# Patient Record
Sex: Female | Born: 1935 | Race: White | Hispanic: No | Smoking: Never smoker
Health system: Southern US, Community
[De-identification: ages and names within clinical notes are randomized; demographics above are authoritative.]

## PROBLEM LIST (undated history)

## (undated) DIAGNOSIS — E7800 Pure hypercholesterolemia, unspecified: Secondary | ICD-10-CM

## (undated) DIAGNOSIS — I10 Essential (primary) hypertension: Secondary | ICD-10-CM

## (undated) DIAGNOSIS — L03113 Cellulitis of right upper limb: Secondary | ICD-10-CM

## (undated) DIAGNOSIS — H9190 Unspecified hearing loss, unspecified ear: Secondary | ICD-10-CM

## (undated) DIAGNOSIS — I4891 Unspecified atrial fibrillation: Secondary | ICD-10-CM

## (undated) DIAGNOSIS — I495 Sick sinus syndrome: Secondary | ICD-10-CM

## (undated) DIAGNOSIS — K5900 Constipation, unspecified: Secondary | ICD-10-CM

## (undated) DIAGNOSIS — E782 Mixed hyperlipidemia: Secondary | ICD-10-CM

## (undated) DIAGNOSIS — D649 Anemia, unspecified: Secondary | ICD-10-CM

## (undated) DIAGNOSIS — F419 Anxiety disorder, unspecified: Secondary | ICD-10-CM

## (undated) DIAGNOSIS — C44310 Basal cell carcinoma of skin of unspecified parts of face: Secondary | ICD-10-CM

## (undated) DIAGNOSIS — Z0000 Encounter for general adult medical examination without abnormal findings: Secondary | ICD-10-CM

## (undated) DIAGNOSIS — M1990 Unspecified osteoarthritis, unspecified site: Secondary | ICD-10-CM

## (undated) DIAGNOSIS — R55 Syncope and collapse: Secondary | ICD-10-CM

## (undated) HISTORY — DX: Constipation, unspecified: K59.00

## (undated) HISTORY — DX: Unspecified osteoarthritis, unspecified site: M19.90

## (undated) HISTORY — PX: TUBAL LIGATION: SHX77

## (undated) HISTORY — DX: Unspecified hearing loss, unspecified ear: H91.90

## (undated) HISTORY — DX: Basal cell carcinoma of skin of unspecified parts of face: C44.310

## (undated) HISTORY — DX: Essential (primary) hypertension: I10

## (undated) HISTORY — DX: Encounter for general adult medical examination without abnormal findings: Z00.00

## (undated) HISTORY — DX: Pure hypercholesterolemia, unspecified: E78.00

## (undated) HISTORY — DX: Anxiety disorder, unspecified: F41.9

## (undated) HISTORY — DX: Unspecified atrial fibrillation: I48.91

## (undated) HISTORY — DX: Syncope and collapse: R55

## (undated) HISTORY — DX: Cellulitis of right upper limb: L03.113

## (undated) HISTORY — DX: Anemia, unspecified: D64.9

## (undated) HISTORY — DX: Mixed hyperlipidemia: E78.2

## (undated) HISTORY — DX: Sick sinus syndrome: I49.5

## (undated) HISTORY — PX: KIDNEY SURGERY: SHX687

---
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
## 2004-11-25 ENCOUNTER — Ambulatory Visit: Payer: Self-pay | Admitting: Internal Medicine

## 2004-12-12 ENCOUNTER — Ambulatory Visit: Payer: Self-pay | Admitting: Internal Medicine

## 2005-04-03 ENCOUNTER — Ambulatory Visit: Payer: Self-pay | Admitting: Internal Medicine

## 2006-03-05 ENCOUNTER — Ambulatory Visit: Payer: Self-pay | Admitting: Internal Medicine

## 2006-04-30 ENCOUNTER — Ambulatory Visit: Payer: Self-pay | Admitting: Internal Medicine

## 2007-03-25 ENCOUNTER — Ambulatory Visit: Payer: Self-pay | Admitting: Internal Medicine

## 2007-05-27 ENCOUNTER — Ambulatory Visit: Payer: Self-pay | Admitting: Urology

## 2007-11-02 ENCOUNTER — Ambulatory Visit: Payer: Self-pay | Admitting: Internal Medicine

## 2007-12-07 ENCOUNTER — Ambulatory Visit: Payer: Self-pay | Admitting: Internal Medicine

## 2008-06-22 ENCOUNTER — Ambulatory Visit: Payer: Self-pay | Admitting: Internal Medicine

## 2008-09-06 ENCOUNTER — Ambulatory Visit: Payer: Self-pay | Admitting: Gastroenterology

## 2008-09-06 HISTORY — PX: COLONOSCOPY: SHX174

## 2008-09-07 ENCOUNTER — Ambulatory Visit: Payer: Self-pay | Admitting: Gastroenterology

## 2009-08-19 ENCOUNTER — Ambulatory Visit: Payer: Self-pay | Admitting: Internal Medicine

## 2010-08-26 ENCOUNTER — Ambulatory Visit: Payer: Self-pay | Admitting: Internal Medicine

## 2011-08-11 HISTORY — PX: CATARACT EXTRACTION: SUR2

## 2011-09-21 ENCOUNTER — Ambulatory Visit: Payer: Self-pay | Admitting: Internal Medicine

## 2011-10-06 ENCOUNTER — Ambulatory Visit: Payer: Self-pay | Admitting: Ophthalmology

## 2012-05-31 ENCOUNTER — Telehealth: Payer: Self-pay | Admitting: Internal Medicine

## 2012-05-31 NOTE — Telephone Encounter (Signed)
Ok to refill x 2.  Will have to clarify dose with pharmacy.  Thanks.

## 2012-05-31 NOTE — Telephone Encounter (Signed)
Pt is needing refill on trazodone She uses [facility] on [address]. She took her last one last night.

## 2012-06-01 NOTE — Telephone Encounter (Signed)
Pt called checking on rx.  Please call when rx is called

## 2012-06-02 ENCOUNTER — Other Ambulatory Visit: Payer: Self-pay | Admitting: *Deleted

## 2012-06-02 NOTE — Telephone Encounter (Signed)
Called pharmacy to clarify dose line was busy will call back later

## 2012-06-03 NOTE — Telephone Encounter (Signed)
Dr. Scott called this into the pharmacy with 1 refill. °

## 2012-06-03 NOTE — Telephone Encounter (Signed)
Received fax from [facility] for trazo-done 50 mg tab Sig: take one and one-half by mouth at bedtime, may increase to two tablets as needed

## 2012-06-10 DIAGNOSIS — I4891 Unspecified atrial fibrillation: Secondary | ICD-10-CM

## 2012-06-10 HISTORY — DX: Unspecified atrial fibrillation: I48.91

## 2012-06-13 ENCOUNTER — Emergency Department: Payer: Self-pay | Admitting: Emergency Medicine

## 2012-06-13 LAB — COMPREHENSIVE METABOLIC PANEL
Alkaline Phosphatase: 99 U/L (ref 50–136)
Anion Gap: 9 (ref 7–16)
BUN: 15 mg/dL (ref 7–18)
Bilirubin,Total: 0.6 mg/dL (ref 0.2–1.0)
Chloride: 103 mmol/L (ref 98–107)
Co2: 28 mmol/L (ref 21–32)
Creatinine: 0.49 mg/dL — ABNORMAL LOW (ref 0.60–1.30)
EGFR (African American): >60
EGFR (Non-African Amer.): >60
Osmolality: 280 (ref 275–301)
SGOT(AST): 33 U/L (ref 15–37)
SGPT (ALT): 24 U/L (ref 12–78)

## 2012-06-13 LAB — CK TOTAL AND CKMB (NOT AT ARMC)
CK, Total: 179 U/L (ref 21–215)
CK-MB: 2.2 ng/mL (ref 0.5–3.6)

## 2012-06-13 LAB — CBC
Platelet: 182 10*3/uL (ref 150–440)
RBC: 5 10*6/uL (ref 3.80–5.20)
RDW: 14.2 % (ref 11.5–14.5)
WBC: 5.2 10*3/uL (ref 3.6–11.0)

## 2012-06-13 LAB — TROPONIN I: Troponin-I: <0.02 ng/mL

## 2012-07-08 ENCOUNTER — Encounter: Payer: Self-pay | Admitting: Internal Medicine

## 2012-07-08 ENCOUNTER — Ambulatory Visit (INDEPENDENT_AMBULATORY_CARE_PROVIDER_SITE_OTHER): Payer: Medicare Other | Admitting: Internal Medicine

## 2012-07-08 VITALS — BP 141/79 | HR 59 | Temp 97.8°F | Ht 63.0 in | Wt 143.0 lb

## 2012-07-08 DIAGNOSIS — I4891 Unspecified atrial fibrillation: Secondary | ICD-10-CM

## 2012-07-08 DIAGNOSIS — E7800 Pure hypercholesterolemia, unspecified: Secondary | ICD-10-CM | POA: Insufficient documentation

## 2012-07-08 DIAGNOSIS — Z139 Encounter for screening, unspecified: Secondary | ICD-10-CM

## 2012-07-08 DIAGNOSIS — M1990 Unspecified osteoarthritis, unspecified site: Secondary | ICD-10-CM

## 2012-07-08 DIAGNOSIS — I10 Essential (primary) hypertension: Secondary | ICD-10-CM

## 2012-07-08 NOTE — Assessment & Plan Note (Signed)
Has seen Dr Kernodle.   

## 2012-07-08 NOTE — Patient Instructions (Signed)
It was nice seeing you today.  I am glad you are doing well.  I want you to try Benefiber - daily - to help with the constipation.  Let me know if problems.

## 2012-07-08 NOTE — Progress Notes (Signed)
Subjective:    Patient ID: Kristin Avila, female    DOB: 07/06/1928, 78 y.o.   MRN: [id number]  HPI 78 year old female with past history of hypertension, hypercholesterolemia and osteoarthritis who comes in today for a scheduled follow up.  Was evaluated in the ER 06/13/12 for increased heart rate, palpitations and elevated blood pressure.  Was found to be in afib.  Was treated with a beta blocker and converted to SR.  Saw Dr Gwen Pounds in follow up.  See his notes for details.  Atenolol was changed to Metoprolol.  Per is note, stress test revealed good exercise tolerance with no evidence of ischemia or rhythm disturbance.  ECHO revealed normal LV systolic function with moderate mitral and mild tricuspid regurgitation.  He recommended continuing daily aspirin.  She states she has not had any symptoms similar to what she felt when she went to the ER, but she has noticed some minimal fluttering in the am prior to taking her metoprolol.  Overall she feels good and feels she is back to her baseline.  Dr Gwen Pounds felt no changes needed at this time.  No sob.  No nausea or vomiting.    Past Medical History  Diagnosis Date  . Hypertension   . Hypercholesterolemia   . Anemia   . Osteoarthritis     hands, feet  . Atrial fibrillation 11/13    new onset 11/13    Outpatient Encounter Prescriptions as of 07/08/2012  Medication Sig Dispense Refill  . amLODipine (NORVASC) 5 MG tablet Take 5 mg by mouth daily.      Marland Kitchen aspirin 81 MG tablet Take 81 mg by mouth daily.      . Cholecalciferol (VITAMIN D-3) 1000 UNITS CAPS Take by mouth.      . hydrochlorothiazide (HYDRODIURIL) 25 MG tablet Take 25 mg by mouth daily.      Marland Kitchen lisinopril (PRINIVIL,ZESTRIL) 20 MG tablet 20 mg. Take 2 tablets q day      . lovastatin (MEVACOR) 20 MG tablet Take 20 mg by mouth daily.      . metoprolol tartrate (LOPRESSOR) 25 MG tablet Take 25 mg by mouth 2 (two) times daily.      . naproxen sodium (ANAPROX) 220 MG tablet Take 220 mg by  mouth 2 (two) times daily with a meal.      . traZODone (DESYREL) 100 MG tablet Take by mouth at bedtime.      . [DISCONTINUED] acetaminophen (TYLENOL) 650 MG CR tablet Take 650 mg by mouth every 8 (eight) hours as needed.      . [DISCONTINUED] atenolol (TENORMIN) 25 MG tablet 25 mg. 1/2 tablet q day        Review of Systems Patient denies any headache, lightheadedness or dizziness.  No sinus or allergy symptoms.  No chest pain, tightness or palpitations currently.  Does occasionally notice some fluttering in the am.  No increased shortness of breath, cough or congestion.  No nausea or vomiting.  No abdominal pain or cramping.  No bowel change, such as diarrhea, constipation, BRBPR or melana.  No urine change.        Objective:   Physical Exam Filed Vitals:   07/08/12 0811  BP: 141/79  Pulse: 59  Temp: 97.8 F (35.82 C)   78 year old female in no acute distress.   HEENT:  Nares - clear.  OP- without lesions or erythema.  NECK:  Supple, nontender.  No audible bruit.   HEART:  Appears to be regular.  LUNGS:  Without crackles or wheezing audible.  Respirations even and unlabored.   RADIAL PULSE:  Equal bilaterally.  ABDOMEN:  Soft, nontender.  No audible abdominal bruit.   EXTREMITIES:  No increased edema to be present.                     Assessment & Plan:  DIFFICULTY SLEEPING.  Taking Trazodone and doing well with this medication.  Follow.  Ambien and Ambien CR did not work.    GI.  Colonoscopy 09/06/08 had to be stopped secondary to poor visualization.  Subsequently had a barium enema that revealed significant looping and tortuous colon.  Currently asymptomatic.  Recommended a follow up colonoscopy 2015.    CARDIOVASCULAR.  New diagnosis of afib recently.  Now in SR.  On Metoprolol.  Seeing Dr Gwen Pounds.  Recent stress test and ECHO as outlined.  Currently doing well.  Follow.  Per cardiology, continue daily aspirin.   LEUKOPENIA.  Check cbc.    HEALTH MAINTENANCE.  Physical  01/20/12.  Mammogram 09/21/11- Birads II.  Colonoscopy as outlined.

## 2012-07-09 ENCOUNTER — Encounter: Payer: Self-pay | Admitting: Internal Medicine

## 2012-07-09 DIAGNOSIS — I4891 Unspecified atrial fibrillation: Secondary | ICD-10-CM | POA: Insufficient documentation

## 2012-07-09 NOTE — Assessment & Plan Note (Signed)
In SR now.  Seeing Dr Gwen Pounds.  See above.  Follow.  Continue aspirin.

## 2012-07-09 NOTE — Assessment & Plan Note (Signed)
Low cholesterol diet and exercise.  On lovastatin.  Check lipid panel and liver function with next labs.

## 2012-07-09 NOTE — Assessment & Plan Note (Signed)
Blood pressure averaging 104-120s/60s.  Continue same meds.  Follow.

## 2012-07-18 ENCOUNTER — Other Ambulatory Visit: Payer: Self-pay | Admitting: Internal Medicine

## 2012-07-18 MED ORDER — LOVASTATIN 20 MG PO TABS: 20.0000 mg | ORAL_TABLET | Freq: Every day | ORAL | Status: DC

## 2012-07-18 NOTE — Telephone Encounter (Signed)
rx sent to [facility] for lovasatin 20mg  q day #90 with 3 refills.

## 2012-07-18 NOTE — Telephone Encounter (Signed)
Pt is needing refill on Lobastatin. Pt uses [facility] on [address].

## 2012-07-26 ENCOUNTER — Telehealth: Payer: Self-pay | Admitting: Internal Medicine

## 2012-07-26 MED ORDER — TRAZODONE HCL 100 MG PO TABS: ORAL_TABLET | ORAL | Status: DC

## 2012-07-26 MED ORDER — LISINOPRIL 20 MG PO TABS: ORAL_TABLET | ORAL | Status: DC

## 2012-07-26 NOTE — Telephone Encounter (Signed)
Refill  [facility] [address]  Lisinopril 20 mg tab # 60  Trazodone 50 mg #60

## 2012-07-26 NOTE — Telephone Encounter (Signed)
Sent in to pharmacy.  

## 2012-07-27 ENCOUNTER — Ambulatory Visit: Payer: Self-pay | Admitting: Ophthalmology

## 2012-08-09 ENCOUNTER — Ambulatory Visit: Payer: Self-pay | Admitting: Ophthalmology

## 2012-08-20 ENCOUNTER — Observation Stay: Payer: Self-pay | Admitting: Internal Medicine

## 2012-08-20 LAB — CK TOTAL AND CKMB (NOT AT ARMC): CK, Total: 121 U/L (ref 21–215)

## 2012-08-20 LAB — COMPREHENSIVE METABOLIC PANEL
Albumin: 4.3 g/dL (ref 3.4–5.0)
Alkaline Phosphatase: 84 U/L (ref 50–136)
Anion Gap: 8 (ref 7–16)
BUN: 19 mg/dL — ABNORMAL HIGH (ref 7–18)
Calcium, Total: 9.5 mg/dL (ref 8.5–10.1)
Chloride: 104 mmol/L (ref 98–107)
EGFR (African American): >60
Glucose: 94 mg/dL (ref 65–99)
SGPT (ALT): 19 U/L (ref 12–78)

## 2012-08-20 LAB — CBC WITH DIFFERENTIAL/PLATELET
Basophil #: 0 10*3/uL (ref 0.0–0.1)
Eosinophil #: 0.1 10*3/uL (ref 0.0–0.7)
Eosinophil %: 1.5 %
HCT: 40.1 % (ref 35.0–47.0)
HGB: 13.7 g/dL (ref 12.0–16.0)
Lymphocyte #: 0.9 10*3/uL — ABNORMAL LOW (ref 1.0–3.6)
MCH: 31.1 pg (ref 26.0–34.0)
MCHC: 34.1 g/dL (ref 32.0–36.0)
MCV: 91 fL (ref 80–100)
Monocyte #: 0.7 x10 3/mm (ref 0.2–0.9)
Monocyte %: 11 %
Neutrophil %: 70.8 %
RBC: 4.39 10*6/uL (ref 3.80–5.20)
WBC: 5.9 10*3/uL (ref 3.6–11.0)

## 2012-08-20 LAB — URINALYSIS, COMPLETE
Bacteria: NONE SEEN
Ketone: NEGATIVE
Nitrite: NEGATIVE
Protein: NEGATIVE
RBC,UR: <1 /HPF (ref 0–5)
Specific Gravity: 1.005 (ref 1.003–1.030)
Squamous Epithelial: <1
WBC UR: 1 /HPF (ref 0–5)

## 2012-08-20 LAB — TROPONIN I
Troponin-I: <0.02 ng/mL
Troponin-I: <0.02 ng/mL

## 2012-08-21 LAB — BASIC METABOLIC PANEL
BUN: 19 mg/dL — ABNORMAL HIGH (ref 7–18)
Chloride: 107 mmol/L (ref 98–107)
Creatinine: 0.61 mg/dL (ref 0.60–1.30)
EGFR (African American): >60
EGFR (Non-African Amer.): >60
Potassium: 3.4 mmol/L — ABNORMAL LOW (ref 3.5–5.1)

## 2012-08-21 LAB — CK TOTAL AND CKMB (NOT AT ARMC): CK, Total: 83 U/L (ref 21–215)

## 2012-08-21 LAB — TROPONIN I: Troponin-I: <0.02 ng/mL

## 2012-08-21 LAB — LIPID PANEL: Cholesterol: 185 mg/dL (ref 0–200)

## 2012-08-22 ENCOUNTER — Telehealth: Payer: Self-pay | Admitting: *Deleted

## 2012-08-22 LAB — URINE CULTURE

## 2012-08-22 NOTE — Telephone Encounter (Signed)
Spoke with patient, she stated that she is doing fine following her hospitalization. Will see patient in office on 08/23/12.

## 2012-08-23 ENCOUNTER — Ambulatory Visit (INDEPENDENT_AMBULATORY_CARE_PROVIDER_SITE_OTHER): Payer: 59 | Admitting: Internal Medicine

## 2012-08-23 ENCOUNTER — Encounter: Payer: Self-pay | Admitting: Internal Medicine

## 2012-08-23 VITALS — BP 132/72 | HR 66 | Temp 97.4°F | Ht 63.0 in | Wt 142.8 lb

## 2012-08-23 DIAGNOSIS — I4891 Unspecified atrial fibrillation: Secondary | ICD-10-CM

## 2012-08-23 DIAGNOSIS — I10 Essential (primary) hypertension: Secondary | ICD-10-CM

## 2012-08-23 DIAGNOSIS — H538 Other visual disturbances: Secondary | ICD-10-CM

## 2012-08-23 DIAGNOSIS — E7800 Pure hypercholesterolemia, unspecified: Secondary | ICD-10-CM

## 2012-08-24 ENCOUNTER — Telehealth: Payer: Self-pay | Admitting: Internal Medicine

## 2012-08-24 MED ORDER — LISINOPRIL 40 MG PO TABS: 40.0000 mg | ORAL_TABLET | Freq: Every day | ORAL | Status: DC

## 2012-08-24 MED ORDER — AMLODIPINE BESYLATE 10 MG PO TABS: 10.0000 mg | ORAL_TABLET | Freq: Every day | ORAL | Status: DC

## 2012-08-24 NOTE — Telephone Encounter (Signed)
Pt is calling back to let you know that her Rx bottle says Lisinopril 20 mg (take 2 tablets by mouth daily). Pt uses [facility] on [address].

## 2012-08-24 NOTE — Telephone Encounter (Signed)
She was just in the hospital and they changed two of her blood pressure medications. Please notify pt that I have sent in her rx for her amlodipine and the lisinopril with the new adjusted dose.   Since she is taking 20mg  (2 per day) of lisinopril now, I changed her rx to 40mg  tablets and she is to take one per day.  Since she is taking two amlodipine (5mg ) tablets now - I changed her to 10mg  and she is to take one per day.  I have already sent new rx's to her pharmacy.  Please notify her of the changes.

## 2012-08-24 NOTE — Telephone Encounter (Signed)
Patient notified

## 2012-08-26 ENCOUNTER — Telehealth: Payer: Self-pay | Admitting: Internal Medicine

## 2012-08-26 NOTE — Telephone Encounter (Signed)
Called rachel x 2.  Left voice mail.

## 2012-08-26 NOTE — Telephone Encounter (Signed)
Spoke to Kristin Avila on the phone.  Explained the medication regimen.  Also discussed goal blood pressure readings with her.  Questions answered.

## 2012-08-26 NOTE — Telephone Encounter (Signed)
Patient has a prescription question . And a blood pressure question.

## 2012-08-26 NOTE — Telephone Encounter (Signed)
Please contact Fleet Contras [phone]

## 2012-08-26 NOTE — Telephone Encounter (Signed)
Patient's niece called to correct lisinopril 40 mg to 30 mg 2/daily. Niece also wants to know when patient should be concerned about bp. How low, how many readings at a low pressure or how high, before patient should seek assistance? She would like a call back before end of day. [phone]

## 2012-08-26 NOTE — Telephone Encounter (Signed)
Need to call and find out what question she has.  See previous phone message.

## 2012-08-28 ENCOUNTER — Encounter: Payer: Self-pay | Admitting: Internal Medicine

## 2012-08-28 NOTE — Assessment & Plan Note (Signed)
Blood pressure as outlined.  Medication just adjusted.  Follow pressures.  Continue amlodipine 10mg  q day and lisinopril 40mg  q day (along with her other regular meds).  Follow.

## 2012-08-28 NOTE — Progress Notes (Signed)
Subjective:    Patient ID: Kristin Avila, female    DOB: July 04, 1928, 78 y.o.   MRN: [id number]  HPI 79 year old female with past history of hypertension, hypercholesterolemia and osteoarthritis who comes in today for a hospital follow up.  Was evaluated in the ER 06/13/12 for increased heart rate, palpitations and elevated blood pressure.  Was found to be in afib.  Was treated with a beta blocker and converted to SR.  Saw Dr Gwen Pounds in follow up.  See his notes for details.  Atenolol was changed to Metoprolol.  Per is note, stress test revealed good exercise tolerance with no evidence of ischemia or rhythm disturbance.  ECHO revealed normal LV systolic function with moderate mitral and mild tricuspid regurgitation.  He recommended continuing daily aspirin.  She had been doing well until she was at a [employer].  She felt like things became blurry.  Head was blurry.  EMS called and blood pressure was significantly elevated.  She was kept overnight in the hospital.  CT head negative.  Carotid ultrasound - no hemodynamically significant stenosis.  MRI not done.  Medications were adjusted.  Amlodipine and Lisinopril increased.  Since her discharge, she states she has done well.  No further episodes.  She does still occasionally feel a little quivering sensation.  This is the sensation she felt when she was first diagnosed with afib.  Blood pressure since discharge has averaged 108-134/60-70.  She had one reading 152/81.       Past Medical History  Diagnosis Date  . Hypertension   . Hypercholesterolemia   . Anemia   . Osteoarthritis     hands, feet  . Atrial fibrillation 11/13    new onset 11/13    Outpatient Encounter Prescriptions as of 08/23/2012  Medication Sig Dispense Refill  . aspirin 81 MG tablet Take 81 mg by mouth daily.      . Cholecalciferol (VITAMIN D-3) 1000 UNITS CAPS Take by mouth.      . hydrochlorothiazide (HYDRODIURIL) 25 MG tablet Take 25 mg by mouth daily.      Marland Kitchen lovastatin  (MEVACOR) 20 MG tablet Take 1 tablet (20 mg total) by mouth daily.  90 tablet  3  . metoprolol tartrate (LOPRESSOR) 25 MG tablet Take 25 mg by mouth 2 (two) times daily.      . naproxen sodium (ANAPROX) 220 MG tablet Take 220 mg by mouth 2 (two) times daily with a meal.      . traZODone (DESYREL) 100 MG tablet Take one tablet by mouth at bedtime once a day  30 tablet  2  . [DISCONTINUED] amLODipine (NORVASC) 5 MG tablet Take 5 mg by mouth 2 (two) times daily.       . [DISCONTINUED] lisinopril (PRINIVIL,ZESTRIL) 30 MG tablet Take 30 mg by mouth 2 (two) times daily.      Marland Kitchen amLODipine (NORVASC) 10 MG tablet Take 1 tablet (10 mg total) by mouth daily.  30 tablet  3  . [DISCONTINUED] lisinopril (PRINIVIL,ZESTRIL) 20 MG tablet Take 2 tablets q day  60 tablet  2  . [DISCONTINUED] lisinopril (PRINIVIL,ZESTRIL) 40 MG tablet Take 1 tablet (40 mg total) by mouth daily.  30 tablet  3    Review of Systems Patient denies any headache, lightheadedness or dizziness.  No sinus or allergy symptoms.  No chest pain, tightness or palpitations currently.  Does occasionally have the quivering sensation.  No associated symptoms.  No increased shortness of breath, cough or congestion.  No  nausea or vomiting.  No abdominal pain or cramping.  No bowel change, such as diarrhea, constipation, BRBPR or melana.  No urine change.        Objective:   Physical Exam  Filed Vitals:   08/23/12 1353  BP: 132/72  Pulse: 66  Temp: 97.4 F (36.3 C)   Blood pressure recheck:  18-85/56  79 year old female in no acute distress.   HEENT:  Nares - clear.  OP- without lesions or erythema.  NECK:  Supple, nontender.  No audible bruit.   HEART:  Appears to be regular. LUNGS:  Without crackles or wheezing audible.  Respirations even and unlabored.   RADIAL PULSE:  Equal bilaterally.  ABDOMEN:  Soft, nontender.  No audible abdominal bruit.   EXTREMITIES:  No increased edema to be present.  NEURO.  No focal neuro deficits  identified.                     Assessment & Plan:  ACUTE BLURRING/ELEVATED BLOOD PRESSURE.  Unclear as to the exact etiology.  CT head negative.  Carotid ultrasound - negative.  Blood pressure medication adjusted.  Taking aspirin daily.  Will obtain MRI brain.  Follow.  Back to her baseling.      GI.  Colonoscopy 09/06/08 had to be stopped secondary to poor visualization.  Subsequently had a barium enema that revealed significant looping and tortuous colon.  Currently asymptomatic.  Recommended a follow up colonoscopy 2015.    CARDIOVASCULAR.  New diagnosis of afib recently.  Now in SR.  On Metoprolol.  Seeing Dr Gwen Pounds.  Recent stress test and ECHO as outlined.  Currently doing well.  Follow.  Per cardiology, continue daily aspirin.  Occasional quivering.  Have her follow up with Dr Gwen Pounds.    HEALTH MAINTENANCE.  Physical 01/20/12.  Mammogram 09/21/11- Birads II.  Colonoscopy as outlined.

## 2012-08-28 NOTE — Assessment & Plan Note (Signed)
Low cholesterol diet.   Follow.   

## 2012-08-28 NOTE — Assessment & Plan Note (Signed)
Appears to be in SR.  Occasional quivering.  On metoprolol.  Follow up appt with Dr Gwen Pounds.

## 2012-08-29 ENCOUNTER — Emergency Department: Payer: Self-pay | Admitting: Emergency Medicine

## 2012-08-30 ENCOUNTER — Telehealth: Payer: Self-pay | Admitting: *Deleted

## 2012-08-30 LAB — TROPONIN I: Troponin-I: <0.02 ng/mL

## 2012-08-30 LAB — COMPREHENSIVE METABOLIC PANEL
Albumin: 3.8 g/dL (ref 3.4–5.0)
Alkaline Phosphatase: 85 U/L (ref 50–136)
Anion Gap: 4 — ABNORMAL LOW (ref 7–16)
Bilirubin,Total: 0.3 mg/dL (ref 0.2–1.0)
Chloride: 107 mmol/L (ref 98–107)
Co2: 29 mmol/L (ref 21–32)
Creatinine: 0.56 mg/dL — ABNORMAL LOW (ref 0.60–1.30)
EGFR (African American): >60
EGFR (Non-African Amer.): >60
Glucose: 97 mg/dL (ref 65–99)
Potassium: 3.8 mmol/L (ref 3.5–5.1)
SGOT(AST): 27 U/L (ref 15–37)
SGPT (ALT): 19 U/L (ref 12–78)
Total Protein: 7 g/dL (ref 6.4–8.2)

## 2012-08-30 LAB — URINALYSIS, COMPLETE
Bacteria: NONE SEEN
Bilirubin,UR: NEGATIVE
Glucose,UR: NEGATIVE mg/dL (ref 0–75)
Leukocyte Esterase: NEGATIVE
Protein: NEGATIVE
RBC,UR: <1 /HPF (ref 0–5)
Squamous Epithelial: NONE SEEN
WBC UR: <1 /HPF (ref 0–5)

## 2012-08-30 LAB — CBC
HGB: 13.3 g/dL (ref 12.0–16.0)
MCH: 30.4 pg (ref 26.0–34.0)
MCHC: 33.9 g/dL (ref 32.0–36.0)
MCV: 90 fL (ref 80–100)
Platelet: 191 10*3/uL (ref 150–440)
RBC: 4.37 10*6/uL (ref 3.80–5.20)
RDW: 13.5 % (ref 11.5–14.5)
WBC: 6.4 10*3/uL (ref 3.6–11.0)

## 2012-08-30 NOTE — Telephone Encounter (Signed)
Patient went to [facility]  last night because her blood pressure was up to 188/89. Patient told blood pressure was not to high. She was sent home after a few hours of observation. [facility] told her to ask you for blood pressure pill that is 60 mg instead of 40 mg. Patient wanted you to be informed.

## 2012-08-30 NOTE — Telephone Encounter (Signed)
Clonidine 0.1, only take when BP is over 80.

## 2012-08-30 NOTE — Telephone Encounter (Signed)
Please find out exactly what she is taking  - need list of all meds and how she is taking them.

## 2012-08-31 NOTE — Telephone Encounter (Signed)
I called pt and spoke to her 08/30/12 regarding her questions. Blood pressure doing better.  She feels fine.  She is going to start taking her amlodipine in the evening.  Will monitor blood pressure and call if problems.  Just adjusted medication.  Pt comfortable with this plan.

## 2012-09-06 ENCOUNTER — Encounter: Payer: Self-pay | Admitting: Internal Medicine

## 2012-09-21 ENCOUNTER — Ambulatory Visit: Payer: Self-pay | Admitting: Internal Medicine

## 2012-09-23 ENCOUNTER — Encounter: Payer: Self-pay | Admitting: Internal Medicine

## 2012-09-27 ENCOUNTER — Ambulatory Visit (INDEPENDENT_AMBULATORY_CARE_PROVIDER_SITE_OTHER): Payer: 59 | Admitting: Internal Medicine

## 2012-09-27 ENCOUNTER — Encounter: Payer: Self-pay | Admitting: Internal Medicine

## 2012-09-27 VITALS — BP 160/70 | HR 63 | Temp 97.7°F | Ht 63.0 in | Wt 146.0 lb

## 2012-09-27 DIAGNOSIS — F411 Generalized anxiety disorder: Secondary | ICD-10-CM

## 2012-09-27 DIAGNOSIS — I4891 Unspecified atrial fibrillation: Secondary | ICD-10-CM

## 2012-09-27 DIAGNOSIS — I10 Essential (primary) hypertension: Secondary | ICD-10-CM

## 2012-09-27 DIAGNOSIS — F419 Anxiety disorder, unspecified: Secondary | ICD-10-CM

## 2012-09-27 DIAGNOSIS — E7800 Pure hypercholesterolemia, unspecified: Secondary | ICD-10-CM

## 2012-09-27 MED ORDER — FLUOXETINE HCL 10 MG PO CAPS: 10.0000 mg | ORAL_CAPSULE | Freq: Every day | ORAL | Status: DC

## 2012-10-02 ENCOUNTER — Encounter: Payer: Self-pay | Admitting: Internal Medicine

## 2012-10-02 DIAGNOSIS — F419 Anxiety disorder, unspecified: Secondary | ICD-10-CM

## 2012-10-02 HISTORY — DX: Anxiety disorder, unspecified: F41.9

## 2012-10-02 NOTE — Progress Notes (Signed)
Subjective:    Patient ID: Kristin Avila, female    DOB: 29-Apr-1928, 79 y.o.   MRN: [id number]  HPI 79 year old female with past history of hypertension, hypercholesterolemia and osteoarthritis who comes in today for a scheduled follow up.  Was evaluated in the ER 06/13/12 for increased heart rate, palpitations and elevated blood pressure.  Was found to be in afib.  Was treated with a beta blocker and converted to SR.  Saw Dr Gwen Pounds in follow up.  See his notes for details.  Atenolol was changed to Metoprolol.  Per is note, stress test revealed good exercise tolerance with no evidence of ischemia or rhythm disturbance.  ECHO revealed normal LV systolic function with moderate mitral and mild tricuspid regurgitation.  He recommended continuing daily aspirin.  She was subsequently admitted for observation (prior to last visit) for elevated blood pressure and blurred vision.  See last note for details.  MRI revealed possible small vessel ischemic changes but no acute change.  She did have some changes in the cervical cord.  Blood pressure medication was adjusted  - last visit.  Blood pressures at home have been averaging 110-130s/60-70s.  She has felt better.  She still may occasionally notice a little subtle change in how she feels.  Feels a little trembling inside.  She relates it to anxiety.  Does better when she can get out.  Feels it may be getting worse (her anxiety).  Feels she needs something to help with anxiety.  No depression.  Eating and drinking well.       Past Medical History  Diagnosis Date  . Hypertension   . Hypercholesterolemia   . Anemia   . Osteoarthritis     hands, feet  . Atrial fibrillation 11/13    new onset 11/13    Outpatient Encounter Prescriptions as of 09/27/2012  Medication Sig Dispense Refill  . amLODipine (NORVASC) 10 MG tablet Take 1 tablet (10 mg total) by mouth daily.  30 tablet  3  . aspirin 81 MG tablet Take 81 mg by mouth daily.      . Cholecalciferol  (VITAMIN D-3) 1000 UNITS CAPS Take by mouth.      . hydrochlorothiazide (HYDRODIURIL) 25 MG tablet Take 1/2 tablet daily      . lisinopril (PRINIVIL,ZESTRIL) 40 MG tablet Take 40 mg by mouth daily.      Marland Kitchen lovastatin (MEVACOR) 20 MG tablet Take 1 tablet (20 mg total) by mouth daily.  90 tablet  3  . metoprolol tartrate (LOPRESSOR) 25 MG tablet Take 25 mg by mouth 2 (two) times daily.      . naproxen sodium (ANAPROX) 220 MG tablet Take 220 mg by mouth 2 (two) times daily with a meal.      . traZODone (DESYREL) 100 MG tablet Take one tablet by mouth at bedtime once a day  30 tablet  2  . cloNIDine (CATAPRES) 0.1 MG tablet As directed      . FLUoxetine (PROZAC) 10 MG capsule Take 1 capsule (10 mg total) by mouth daily.  30 capsule  2  . lisinopril (PRINIVIL,ZESTRIL) 30 MG tablet Take 30 mg by mouth 2 (two) times daily.       No facility-administered encounter medications on file as of 09/27/2012.    Review of Systems Patient denies any headache, lightheadedness or significant dizziness.  No sinus or allergy symptoms.  No chest pain, tightness or palpitations currently.  Does occasionally have the quivering sensation.  No associated symptoms.  No increased shortness of breath, cough or congestion.  No nausea or vomiting.  No abdominal pain or cramping.  No bowel change, such as diarrhea, constipation, BRBPR or melana.  No urine change.        Objective:   Physical Exam  Filed Vitals:   09/27/12 1616  BP: 160/70  Pulse: 63  Temp: 97.7 F (36.5 C)   Blood pressure recheck:  64-98/73  79 year old female in no acute distress.   HEENT:  Nares - clear.  OP- without lesions or erythema.  NECK:  Supple, nontender.  No audible bruit.   HEART:  Appears to be regular. LUNGS:  Without crackles or wheezing audible.  Respirations even and unlabored.   RADIAL PULSE:  Equal bilaterally.  ABDOMEN:  Soft, nontender.  No audible abdominal bruit.   EXTREMITIES:  No increased edema to be present.   NEURO.  No focal neuro deficits identified.                     Assessment & Plan:  PREVIOUS ACUTE BLURRING/ELEVATED BLOOD PRESSURE.  See last note for details.  No reoccurring problems.  On daily aspirin.  Continue.  MRI as outlined.  Continue risk factor modification.   CERVICAL CORD CHANGES.  Noted on MRI.  Have forwarded scan to neurology for review.    GI.  Colonoscopy 09/06/08 had to be stopped secondary to poor visualization.  Subsequently had a barium enema that revealed significant looping and tortuous colon.  Currently asymptomatic.  Recommended a follow up colonoscopy 2015.    CARDIOVASCULAR.  New diagnosis of afib recently.  Now in SR.  On Metoprolol.  Seeing Dr Gwen Pounds.  Recent stress test and ECHO as outlined.  Currently doing well.  Follow.  Per cardiology, continue daily aspirin.  Occasional quivering.  Have her follow up with Dr Gwen Pounds.  Treat anxiety.   HEALTH MAINTENANCE.  Physical 01/20/12.  Mammogram 09/21/11- Birads II.  Colonoscopy as outlined.

## 2012-10-02 NOTE — Assessment & Plan Note (Signed)
Symptoms as outlined.  She feels she needs something to help control.  Start prozac 10mg  q day.  Follow.

## 2012-10-02 NOTE — Assessment & Plan Note (Signed)
Sees Dr Gwen Pounds.  Currently in SR.  Follow.  Treat anxiety.

## 2012-10-02 NOTE — Assessment & Plan Note (Signed)
Low cholesterol diet and exercise.  Continue lovastatin.  Follow lipid panel and liver function.   

## 2012-10-02 NOTE — Assessment & Plan Note (Signed)
Blood pressure as outlined.  Appears to be doing well at home.  Continue same medication regimen.  Follow pressures.  Follow metabolic panel.

## 2012-10-07 ENCOUNTER — Encounter: Payer: Self-pay | Admitting: Internal Medicine

## 2012-10-26 ENCOUNTER — Telehealth: Payer: Self-pay | Admitting: Internal Medicine

## 2012-10-26 ENCOUNTER — Other Ambulatory Visit: Payer: Self-pay | Admitting: *Deleted

## 2012-10-26 MED ORDER — TRAZODONE HCL 100 MG PO TABS: ORAL_TABLET | ORAL | Status: DC

## 2012-10-26 NOTE — Telephone Encounter (Signed)
Script filled for trazodone per epic

## 2012-10-26 NOTE — Telephone Encounter (Signed)
Patient states [facility] on [address] was supposed to have faxed request last week for her trazodone 100 mg. She is almost out of medication, please call in for patient.

## 2012-10-31 ENCOUNTER — Other Ambulatory Visit (INDEPENDENT_AMBULATORY_CARE_PROVIDER_SITE_OTHER): Payer: 59

## 2012-10-31 DIAGNOSIS — I10 Essential (primary) hypertension: Secondary | ICD-10-CM

## 2012-10-31 DIAGNOSIS — E7800 Pure hypercholesterolemia, unspecified: Secondary | ICD-10-CM

## 2012-10-31 DIAGNOSIS — I4891 Unspecified atrial fibrillation: Secondary | ICD-10-CM

## 2012-10-31 LAB — BASIC METABOLIC PANEL
CO2: 32 mEq/L (ref 19–32)
Chloride: 100 mEq/L (ref 96–112)
Creatinine, Ser: 0.7 mg/dL (ref 0.4–1.2)
Glucose, Bld: 92 mg/dL (ref 70–99)

## 2012-10-31 LAB — HEPATIC FUNCTION PANEL
AST: 22 U/L (ref 0–37)
Alkaline Phosphatase: 69 U/L (ref 39–117)
Bilirubin, Direct: 0.1 mg/dL (ref 0.0–0.3)
Total Protein: 6.8 g/dL (ref 6.0–8.3)

## 2012-10-31 LAB — LIPID PANEL
Cholesterol: 193 mg/dL (ref 0–200)
LDL Cholesterol: 105 mg/dL — ABNORMAL HIGH (ref 0–99)
Triglycerides: 52 mg/dL (ref 0.0–149.0)

## 2012-11-04 ENCOUNTER — Encounter: Payer: Self-pay | Admitting: Internal Medicine

## 2012-11-04 ENCOUNTER — Ambulatory Visit (INDEPENDENT_AMBULATORY_CARE_PROVIDER_SITE_OTHER): Payer: 59 | Admitting: Internal Medicine

## 2012-11-04 VITALS — BP 140/78 | HR 58 | Temp 98.2°F | Ht 63.0 in | Wt 143.8 lb

## 2012-11-04 DIAGNOSIS — F419 Anxiety disorder, unspecified: Secondary | ICD-10-CM

## 2012-11-04 DIAGNOSIS — I4891 Unspecified atrial fibrillation: Secondary | ICD-10-CM

## 2012-11-04 DIAGNOSIS — E7800 Pure hypercholesterolemia, unspecified: Secondary | ICD-10-CM

## 2012-11-04 DIAGNOSIS — I10 Essential (primary) hypertension: Secondary | ICD-10-CM

## 2012-11-04 DIAGNOSIS — F411 Generalized anxiety disorder: Secondary | ICD-10-CM

## 2012-11-04 DIAGNOSIS — M1990 Unspecified osteoarthritis, unspecified site: Secondary | ICD-10-CM

## 2012-11-04 MED ORDER — FLUOXETINE HCL 20 MG PO TABS: 20.0000 mg | ORAL_TABLET | Freq: Every day | ORAL | Status: DC

## 2012-11-06 ENCOUNTER — Encounter: Payer: Self-pay | Admitting: Internal Medicine

## 2012-11-06 NOTE — Assessment & Plan Note (Signed)
Blood pressure as outlined.  Appears to be doing well at home.  Continue same medication regimen.  Follow pressures.  Follow metabolic panel.

## 2012-11-06 NOTE — Assessment & Plan Note (Signed)
Low cholesterol diet and exercise.  Continue lovastatin.  Follow lipid panel and liver function.   

## 2012-11-06 NOTE — Progress Notes (Signed)
Subjective:    Patient ID: Kristin Avila, female    DOB: [birth year], 79 y.o.   MRN: [id number]  HPI 79 year old female with past history of hypertension, hypercholesterolemia and osteoarthritis who comes in today for a scheduled follow up.  Was evaluated in the ER 06/13/12 for increased heart rate, palpitations and elevated blood pressure.  Was found to be in afib.  Was treated with a beta blocker and converted to SR.  Saw Dr Gwen Pounds in follow up.  See his notes for details.  Atenolol was changed to Metoprolol.  Per his note, stress test revealed good exercise tolerance with no evidence of ischemia or rhythm disturbance.  ECHO revealed normal LV systolic function with moderate mitral and mild tricuspid regurgitation.  He recommended continuing daily aspirin.  She was subsequently admitted for observation for elevated blood pressure and blurred vision.  See previous notes for details.  MRI revealed possible small vessel ischemic changes but no acute change.  She did have some changes in the cervical cord.  Blood pressure medication was adjusted.  Blood pressures at home have been averaging 110-130s/60-70s.  She has felt better. Last visit, I started her on Prozac.  She still may occasionally notice a little subtle change in how she feels.  Feels a little trembling inside. She relates it to anxiety.  Does better when she can get out.  No depression.  Eating and drinking well.      Past Medical History  Diagnosis Date  . Hypertension   . Hypercholesterolemia   . Anemia   . Osteoarthritis     hands, feet  . Atrial fibrillation 11/13    new onset 11/13    Outpatient Encounter Prescriptions as of 11/04/2012  Medication Sig Dispense Refill  . amLODipine (NORVASC) 10 MG tablet Take 1 tablet (10 mg total) by mouth daily.  30 tablet  3  . aspirin 81 MG tablet Take 81 mg by mouth daily.      . Cholecalciferol (VITAMIN D-3) 1000 UNITS CAPS Take by mouth.      . cloNIDine (CATAPRES) 0.1 MG tablet As  directed      . hydrochlorothiazide (HYDRODIURIL) 25 MG tablet Take 1/2 tablet daily      . lisinopril (PRINIVIL,ZESTRIL) 30 MG tablet Take 30 mg by mouth 2 (two) times daily.      Marland Kitchen lovastatin (MEVACOR) 20 MG tablet Take 1 tablet (20 mg total) by mouth daily.  90 tablet  3  . metoprolol tartrate (LOPRESSOR) 25 MG tablet Take 25 mg by mouth 2 (two) times daily.      . naproxen sodium (ANAPROX) 220 MG tablet Take 220 mg by mouth 2 (two) times daily with a meal.      . traZODone (DESYREL) 100 MG tablet Take one tablet by mouth at bedtime once a day  30 tablet  2  . [DISCONTINUED] FLUoxetine (PROZAC) 10 MG capsule Take 1 capsule (10 mg total) by mouth daily.  30 capsule  2  . FLUoxetine (PROZAC) 20 MG tablet Take 1 tablet (20 mg total) by mouth daily.  30 tablet  2  . lisinopril (PRINIVIL,ZESTRIL) 40 MG tablet Take 40 mg by mouth daily.       No facility-administered encounter medications on file as of 11/04/2012.    Review of Systems Patient denies any headache, lightheadedness or dizziness.  No sinus or allergy symptoms.  No chest pain, tightness or palpitations currently.  Does occasionally have the quivering sensation.  No associated symptoms.  No increased shortness of breath, cough or congestion.  No nausea or vomiting.  No abdominal pain or cramping.  No bowel change, such as diarrhea, constipation, BRBPR or melana.  No urine change.  Feels some better on the prozac.  See above.       Objective:   Physical Exam  Filed Vitals:   11/04/12 0851  BP: 140/78  Pulse: 58  Temp: 98.2 F (36.8 C)   Blood pressure recheck:  148/80, pulse 43  79 year old female in no acute distress.   HEENT:  Nares - clear.  OP- without lesions or erythema.  NECK:  Supple, nontender.  No audible bruit.   HEART:  Appears to be regular. LUNGS:  Without crackles or wheezing audible.  Respirations even and unlabored.   RADIAL PULSE:  Equal bilaterally.  ABDOMEN:  Soft, nontender.  No audible abdominal bruit.    EXTREMITIES:  No increased edema to be present.  NEURO.  No focal neuro deficits identified.                     Assessment & Plan:  GI.  Colonoscopy 09/06/08 had to be stopped secondary to poor visualization.  Subsequently had a barium enema that revealed significant looping and tortuous colon.  Currently asymptomatic.  Recommended a follow up colonoscopy 2015.    CARDIOVASCULAR.  New diagnosis of afib recently.  Now in SR.  On Metoprolol.  Seeing Dr Gwen Pounds.  Recent stress test and ECHO as outlined.  Currently doing well.  Follow.  Per cardiology, continue daily aspirin.  Overall she feels her heart is stable.  Treat anxiety.   HEALTH MAINTENANCE.  Physical 01/20/12.  Mammogram 09/21/11- Birads II.  Needs follow up mammogram.  Colonoscopy as outlined.

## 2012-11-06 NOTE — Assessment & Plan Note (Signed)
Has seen Dr Kernodle.   

## 2012-11-06 NOTE — Assessment & Plan Note (Signed)
Sees Dr Gwen Pounds.  Currently in SR.  Follow.  Treat anxiety.

## 2012-11-06 NOTE — Assessment & Plan Note (Signed)
Symptoms as outlined.  Started prozac 10mg  q day last visit.  Tolerating.  Will increase to 20mg  q day.  Follow.  Get her back in soon to reassess.

## 2012-11-16 ENCOUNTER — Other Ambulatory Visit: Payer: Self-pay | Admitting: *Deleted

## 2012-11-16 MED ORDER — HYDROCHLOROTHIAZIDE 25 MG PO TABS: ORAL_TABLET | ORAL | Status: DC

## 2012-11-16 NOTE — Telephone Encounter (Signed)
Refill from pharmacy show that patient is supposed to the medication one tablet by mouth every day. But on medication list is shows that patient is suppose to take 1/2 everyday. How is the patient suppose to take this medication Hydrochlorothiazide 25 mg

## 2012-12-16 ENCOUNTER — Encounter: Payer: Self-pay | Admitting: Internal Medicine

## 2012-12-19 ENCOUNTER — Encounter: Payer: Self-pay | Admitting: Internal Medicine

## 2012-12-23 ENCOUNTER — Telehealth: Payer: Self-pay | Admitting: Internal Medicine

## 2012-12-23 ENCOUNTER — Other Ambulatory Visit: Payer: Self-pay | Admitting: Internal Medicine

## 2012-12-23 NOTE — Telephone Encounter (Signed)
Pt stated she called [facility] on [address] this morning to get a refill on Amlodipine lisinopil  Pt wanted to make sure you received these. Pt stated she had enough to last till wednesday

## 2012-12-23 NOTE — Telephone Encounter (Signed)
On her MAR, she has Lisinopril 40 mg daily and Lisinopril 30 mg bid. Which way should she be taking it, refill requesting 40 mg daily. 08/30/12 telephone call is most recent change I could find, with 30 mg bid.

## 2012-12-23 NOTE — Telephone Encounter (Signed)
Prescriptions already sent to pharmacy. 

## 2012-12-23 NOTE — Telephone Encounter (Signed)
I think she he should be on 40mg  q day.  Both are listed on the med list.  Please confirm with pt she is not taking both and has been taking 40mg  q day.   If on 40mg  q day - refill x 6

## 2012-12-25 NOTE — Addendum Note (Signed)
Addended by: Charm Barges on: 12/25/2012 12:35 PM   Modules accepted: Orders, Medications

## 2012-12-25 NOTE — Telephone Encounter (Signed)
Clarified with pharmacy.  Pt taking lisinopril 40mg  q day.  Refilled on 12/23/12.  Removed 30mg  bid from med list

## 2013-01-25 ENCOUNTER — Other Ambulatory Visit: Payer: Self-pay | Admitting: Internal Medicine

## 2013-01-27 ENCOUNTER — Encounter: Payer: 59 | Admitting: Internal Medicine

## 2013-02-07 ENCOUNTER — Other Ambulatory Visit: Payer: Self-pay | Admitting: Internal Medicine

## 2013-02-22 ENCOUNTER — Other Ambulatory Visit: Payer: Self-pay | Admitting: Internal Medicine

## 2013-03-21 ENCOUNTER — Encounter: Payer: 59 | Admitting: Internal Medicine

## 2013-03-27 ENCOUNTER — Ambulatory Visit (INDEPENDENT_AMBULATORY_CARE_PROVIDER_SITE_OTHER): Payer: 59 | Admitting: Internal Medicine

## 2013-03-27 ENCOUNTER — Encounter: Payer: Self-pay | Admitting: Internal Medicine

## 2013-03-27 VITALS — BP 130/80 | HR 68 | Temp 97.7°F | Ht 62.5 in | Wt 141.0 lb

## 2013-03-27 DIAGNOSIS — M1990 Unspecified osteoarthritis, unspecified site: Secondary | ICD-10-CM

## 2013-03-27 DIAGNOSIS — F411 Generalized anxiety disorder: Secondary | ICD-10-CM

## 2013-03-27 DIAGNOSIS — I10 Essential (primary) hypertension: Secondary | ICD-10-CM

## 2013-03-27 DIAGNOSIS — F419 Anxiety disorder, unspecified: Secondary | ICD-10-CM

## 2013-03-27 DIAGNOSIS — E7800 Pure hypercholesterolemia, unspecified: Secondary | ICD-10-CM

## 2013-03-27 DIAGNOSIS — I4891 Unspecified atrial fibrillation: Secondary | ICD-10-CM

## 2013-03-29 ENCOUNTER — Encounter: Payer: Self-pay | Admitting: Internal Medicine

## 2013-03-29 NOTE — Assessment & Plan Note (Signed)
Sees Dr Gwen Pounds.  Currently in SR.  Follow.  Treat anxiety.

## 2013-03-29 NOTE — Assessment & Plan Note (Signed)
Symptoms as outlined.  On prozac.  Currently doing well.  Follow.

## 2013-03-29 NOTE — Assessment & Plan Note (Signed)
Low cholesterol diet and exercise.  Continue lovastatin.  Follow lipid panel and liver function.   

## 2013-03-29 NOTE — Assessment & Plan Note (Signed)
Has seen Dr Kernodle.  Stable.  

## 2013-03-29 NOTE — Progress Notes (Signed)
Subjective:    Patient ID: Kristin Avila, female    DOB: 28-Mar-1928, 79 y.o.   MRN: [id number]  HPI 79 year old female with past history of hypertension, hypercholesterolemia and osteoarthritis who comes in today to follow up on these issues as well as for a complete physical exam.  Was evaluated in the ER 06/13/12 for increased heart rate, palpitations and elevated blood pressure.  Was found to be in afib.  Was treated with a beta blocker and converted to SR.  Saw Dr Gwen Pounds in follow up.  See his notes for details.  Atenolol was changed to Metoprolol.  Per his note, stress test revealed good exercise tolerance with no evidence of ischemia or rhythm disturbance.  ECHO revealed normal LV systolic function with moderate mitral and mild tricuspid regurgitation.  He recommended continuing daily aspirin.  She was subsequently admitted for observation for elevated blood pressure and blurred vision.  See previous notes for details.  MRI revealed possible small vessel ischemic changes but no acute change.  She did have some changes in the cervical cord.  Blood pressure medication was adjusted.  Dr Gwen Pounds stopped her metoprolol.   See his note for details.  Blood pressures at home have been averaging 80s-120s/50-60s.  Is concerned - bp too low.   No depression.  Eating and drinking well.  On prozac.  Doing well.      Past Medical History  Diagnosis Date  . Hypertension   . Hypercholesterolemia   . Anemia   . Osteoarthritis     hands, feet  . Atrial fibrillation 11/13    new onset 11/13    Outpatient Encounter Prescriptions as of 03/27/2013  Medication Sig Dispense Refill  . amLODipine (NORVASC) 10 MG tablet TAKE ONE TABLET BY MOUTH EVERY DAY  30 tablet  5  . aspirin 81 MG tablet Take 81 mg by mouth daily.      . Cholecalciferol (VITAMIN D-3) 1000 UNITS CAPS Take by mouth.      Marland Kitchen FLUoxetine (PROZAC) 20 MG tablet TAKE ONE TABLET BY MOUTH ONCE DAILY  30 tablet  2  . hydrochlorothiazide (HYDRODIURIL)  25 MG tablet Take 1/2 tablet daily  30 tablet  3  . lisinopril (PRINIVIL,ZESTRIL) 40 MG tablet TAKE ONE TABLET BY MOUTH EVERY DAY  30 tablet  5  . lovastatin (MEVACOR) 20 MG tablet Take 1 tablet (20 mg total) by mouth daily.  90 tablet  3  . naproxen sodium (ANAPROX) 220 MG tablet Take 220 mg by mouth 2 (two) times daily with a meal.      . traZODone (DESYREL) 100 MG tablet TAKE ONE TABLET BY MOUTH AT BEDTIME  30 tablet  2  . [DISCONTINUED] cloNIDine (CATAPRES) 0.1 MG tablet As directed      . [DISCONTINUED] lisinopril (PRINIVIL,ZESTRIL) 40 MG tablet Take 40 mg by mouth daily.      . [DISCONTINUED] metoprolol tartrate (LOPRESSOR) 25 MG tablet Take 25 mg by mouth 2 (two) times daily.       No facility-administered encounter medications on file as of 03/27/2013.    Review of Systems Patient denies any headache, lightheadedness or dizziness.  No sinus or allergy symptoms.  No chest pain, tightness or palpitations currently.   No associated symptoms.  No increased shortness of breath, cough or congestion.  No nausea or vomiting.  No abdominal pain or cramping.  No bowel change, such as diarrhea, constipation, BRBPR or melana.  No urine change.  On prozac.  Blood pressures as  outlined.       Objective:   Physical Exam  Filed Vitals:   03/27/13 1557  BP: 130/80  Pulse: 68  Temp: 97.7 F (36.5 C)   Blood pressure recheck:  79/55  79 year old female in no acute distress.   HEENT:  Nares- clear.  Oropharynx - without lesions. NECK:  Supple.  Nontender.  No audible bruit.  HEART:  Appears to be regular. LUNGS:  No crackles or wheezing audible.  Respirations even and unlabored.  RADIAL PULSE:  Equal bilaterally.    BREASTS:  No nipple discharge or nipple retraction present.  Could not appreciate any distinct nodules or axillary adenopathy.  ABDOMEN:  Soft, nontender.  Bowel sounds present and normal.  No audible abdominal bruit.  GU:  Not performed.   EXTREMITIES:  No increased edema  present.  DP pulses palpable and equal bilaterally.                      Assessment & Plan:  GI.  Colonoscopy 09/06/08 had to be stopped secondary to poor visualization.  Subsequently had a barium enema that revealed significant looping and tortuous colon.  Currently asymptomatic.  Recommended a follow up colonoscopy 2015.    CARDIOVASCULAR.  New diagnosis of afib recently.  Now in SR.  Off Metoprolol.  Seeing Dr Gwen Pounds.  Recent stress test and ECHO as outlined.  Currently doing well.  Follow.  Per cardiology, continue daily aspirin.  Overall she feels her heart is stable.  Treat anxiety.   HEALTH MAINTENANCE.  Physical today.  Mammogram 09/21/12- Birads II.  Colonoscopy as outlined.

## 2013-03-29 NOTE — Assessment & Plan Note (Signed)
Blood pressure as outlined.  Higher here.  Low on outside checks.  Will hold the hctz.  Follow pressures closely.  Get her back in soon to reassess.  Follow metabolic panel.

## 2013-03-31 ENCOUNTER — Other Ambulatory Visit (INDEPENDENT_AMBULATORY_CARE_PROVIDER_SITE_OTHER): Payer: 59

## 2013-03-31 DIAGNOSIS — I10 Essential (primary) hypertension: Secondary | ICD-10-CM

## 2013-03-31 DIAGNOSIS — E7800 Pure hypercholesterolemia, unspecified: Secondary | ICD-10-CM

## 2013-03-31 DIAGNOSIS — I4891 Unspecified atrial fibrillation: Secondary | ICD-10-CM

## 2013-03-31 LAB — LIPID PANEL
Cholesterol: 171 mg/dL (ref 0–200)
LDL Cholesterol: 89 mg/dL (ref 0–99)
Total CHOL/HDL Ratio: 2
VLDL: 10.8 mg/dL (ref 0.0–40.0)

## 2013-03-31 LAB — CBC WITH DIFFERENTIAL/PLATELET
Basophils Relative: 0.9 % (ref 0.0–3.0)
Eosinophils Relative: 2.4 % (ref 0.0–5.0)
HCT: 39.9 % (ref 36.0–46.0)
Hemoglobin: 13.5 g/dL (ref 12.0–15.0)
Lymphs Abs: 1 10*3/uL (ref 0.7–4.0)
MCV: 91.2 fl (ref 78.0–100.0)
Monocytes Absolute: 0.4 10*3/uL (ref 0.1–1.0)
Monocytes Relative: 9.2 % (ref 3.0–12.0)
Platelets: 190 10*3/uL (ref 150.0–400.0)
RBC: 4.38 Mil/uL (ref 3.87–5.11)
WBC: 4.6 10*3/uL (ref 4.5–10.5)

## 2013-03-31 LAB — BASIC METABOLIC PANEL
Chloride: 105 mEq/L (ref 96–112)
GFR: 87.88 mL/min (ref >60.00)
Potassium: 3.7 mEq/L (ref 3.5–5.1)
Sodium: 138 mEq/L (ref 135–145)

## 2013-03-31 LAB — HEPATIC FUNCTION PANEL
ALT: 11 U/L (ref 0–35)
AST: 22 U/L (ref 0–37)
Alkaline Phosphatase: 58 U/L (ref 39–117)
Total Bilirubin: 0.5 mg/dL (ref 0.3–1.2)

## 2013-04-14 ENCOUNTER — Other Ambulatory Visit: Payer: Self-pay | Admitting: Internal Medicine

## 2013-05-05 ENCOUNTER — Ambulatory Visit (INDEPENDENT_AMBULATORY_CARE_PROVIDER_SITE_OTHER): Payer: Medicare Other | Admitting: Internal Medicine

## 2013-05-05 ENCOUNTER — Encounter: Payer: Self-pay | Admitting: Internal Medicine

## 2013-05-05 VITALS — BP 120/80 | HR 63 | Temp 98.1°F | Ht 62.5 in | Wt 143.5 lb

## 2013-05-05 DIAGNOSIS — F411 Generalized anxiety disorder: Secondary | ICD-10-CM

## 2013-05-05 DIAGNOSIS — I4891 Unspecified atrial fibrillation: Secondary | ICD-10-CM

## 2013-05-05 DIAGNOSIS — I10 Essential (primary) hypertension: Secondary | ICD-10-CM

## 2013-05-05 DIAGNOSIS — F419 Anxiety disorder, unspecified: Secondary | ICD-10-CM

## 2013-05-05 DIAGNOSIS — Z23 Encounter for immunization: Secondary | ICD-10-CM

## 2013-05-05 DIAGNOSIS — E7800 Pure hypercholesterolemia, unspecified: Secondary | ICD-10-CM

## 2013-05-05 DIAGNOSIS — M1990 Unspecified osteoarthritis, unspecified site: Secondary | ICD-10-CM

## 2013-05-05 MED ORDER — FLUOXETINE HCL 20 MG PO TABS: ORAL_TABLET | ORAL | Status: DC

## 2013-05-07 ENCOUNTER — Encounter: Payer: Self-pay | Admitting: Internal Medicine

## 2013-05-07 NOTE — Assessment & Plan Note (Signed)
Symptoms as outlined.  On prozac.  Will increase to 30mg  q day.  Follow closely.  Have her call in a few weeks with update.  Consider changing to zoloft if no improvement.  Follow.

## 2013-05-07 NOTE — Progress Notes (Signed)
Subjective:    Patient ID: Kristin Avila, female    DOB: [birth year], 79 y.o.   MRN: [id number]  HPI 79 year old female with past history of hypertension, hypercholesterolemia and osteoarthritis who comes in today for a scheduled follow up.  Was evaluated in the ER 06/13/12 for increased heart rate, palpitations and elevated blood pressure.  Was found to be in afib.  Was treated with a beta blocker and converted to SR.  Saw Dr Gwen Pounds in follow up.  See his notes for details.  Atenolol was changed to Metoprolol.  Per his note, stress test revealed good exercise tolerance with no evidence of ischemia or rhythm disturbance.  ECHO revealed normal LV systolic function with moderate mitral and mild tricuspid regurgitation.  He recommended continuing daily aspirin.  She was subsequently admitted for observation for elevated blood pressure and blurred vision.  See previous notes for details.  MRI revealed possible small vessel ischemic changes but no acute change.  She did have some changes in the cervical cord.  Blood pressure medication was adjusted.  Dr Gwen Pounds stopped her metoprolol.   See his note for details.  She is also now off her diuretic.  Blood pressures at home have been averaging mostly 100-130s/60-70s.  She is still having the issues with the feeling of trembling inside.  Feels the prozac has helped.  States has had problems with anxiety in the past and this feels similar to previous episodes.   No depression.  Eating and drinking well.  Is sleeping better on trazodone.       Past Medical History  Diagnosis Date  . Hypertension   . Hypercholesterolemia   . Anemia   . Osteoarthritis     hands, feet  . Atrial fibrillation 11/13    new onset 11/13    Outpatient Encounter Prescriptions as of 05/05/2013  Medication Sig Dispense Refill  . amLODipine (NORVASC) 10 MG tablet TAKE ONE TABLET BY MOUTH EVERY DAY  30 tablet  5  . aspirin 81 MG tablet Take 81 mg by mouth daily.      .  Cholecalciferol (VITAMIN D-3) 1000 UNITS CAPS Take by mouth.      Marland Kitchen FLUoxetine (PROZAC) 20 MG tablet Take 1 1/2 tablet q day  45 tablet  2  . hydrochlorothiazide (HYDRODIURIL) 25 MG tablet Take 1/2 tablet daily  30 tablet  3  . lisinopril (PRINIVIL,ZESTRIL) 40 MG tablet TAKE ONE TABLET BY MOUTH EVERY DAY  30 tablet  5  . lovastatin (MEVACOR) 20 MG tablet Take 1 tablet (20 mg total) by mouth daily.  90 tablet  3  . naproxen sodium (ANAPROX) 220 MG tablet Take 220 mg by mouth 2 (two) times daily with a meal.      . traZODone (DESYREL) 100 MG tablet TAKE ONE TABLET BY MOUTH AT BEDTIME  30 tablet  2  . [DISCONTINUED] FLUoxetine (PROZAC) 20 MG tablet TAKE ONE TABLET BY MOUTH ONCE DAILY  30 tablet  2   No facility-administered encounter medications on file as of 05/05/2013.    Review of Systems Patient denies any headache, lightheadedness or dizziness.  No sinus or allergy symptoms.  No chest pain, tightness or palpitations.  No associated symptoms.  No increased shortness of breath, cough or congestion.  No nausea or vomiting.  No abdominal pain or cramping.  No bowel change, such as diarrhea, constipation, BRBPR or melana.  No urine change.  On prozac.  Blood pressures as outlined.  Sleeping better with trazodone.  Objective:   Physical Exam  Filed Vitals:   05/05/13 1437  BP: 120/80  Pulse: 63  Temp: 98.1 F (36.7 C)   Blood pressure recheck:  56/34  79 year old female in no acute distress.   HEENT:  Nares- clear.  Oropharynx - without lesions. NECK:  Supple.  Nontender.  No audible bruit.  HEART:  Appears to be regular. LUNGS:  No crackles or wheezing audible.  Respirations even and unlabored.  RADIAL PULSE:  Equal bilaterally.  ABDOMEN:  Soft, nontender.  Bowel sounds present and normal.  No audible abdominal bruit.    EXTREMITIES:  No increased edema present.  DP pulses palpable and equal bilaterally.                      Assessment & Plan:  GI.  Colonoscopy 09/06/08 had to  be stopped secondary to poor visualization.  Subsequently had a barium enema that revealed significant looping and tortuous colon.  Currently asymptomatic.  Recommended a follow up colonoscopy 2015.    CARDIOVASCULAR.  New diagnosis of afib recently.  Now in SR.  Off Metoprolol.  Seeing Dr Gwen Pounds.  Recent stress test and ECHO as outlined.  Currently doing well.  Follow.  Per cardiology, continue daily aspirin.  Overall she feels her heart is stable.  Treat anxiety.   HEALTH MAINTENANCE.  Physical 03/27/13.   Mammogram 09/21/12- Birads II.  Colonoscopy as outlined.

## 2013-05-07 NOTE — Assessment & Plan Note (Signed)
Has seen Dr Kernodle.  Stable.  

## 2013-05-07 NOTE — Assessment & Plan Note (Signed)
Blood pressure as outlined.  Higher here.  Low on outside checks.  Overall ok.  Off hctz.  Follow pressures closely.  Follow metabolic panel.

## 2013-05-07 NOTE — Assessment & Plan Note (Signed)
Sees Dr Kowalski.  Currently in SR.  Follow.  Treat anxiety.    

## 2013-05-07 NOTE — Assessment & Plan Note (Signed)
Low cholesterol diet and exercise.  Continue lovastatin.  Follow lipid panel and liver function.   

## 2013-05-09 ENCOUNTER — Telehealth: Payer: Self-pay | Admitting: *Deleted

## 2013-05-09 MED ORDER — FLUOXETINE HCL 10 MG PO CAPS: 30.0000 mg | ORAL_CAPSULE | Freq: Every day | ORAL | Status: DC

## 2013-05-09 NOTE — Telephone Encounter (Signed)
Phoned in new Rx & updated med list

## 2013-05-09 NOTE — Telephone Encounter (Signed)
At her recent visit, I changed her prozac to 30mg  q day.  If has to use capsules, then send in rx for 10mg  - take 3per day.  (will not be able to 1/2 20mg  capsule).

## 2013-05-09 NOTE — Telephone Encounter (Signed)
Pharmacy Note:  Fluoxetine 20 mg tab  Tablets are on back order can we use capsules?

## 2013-05-26 ENCOUNTER — Other Ambulatory Visit: Payer: Self-pay | Admitting: Internal Medicine

## 2013-05-26 NOTE — Telephone Encounter (Signed)
Okay to refill? 

## 2013-05-26 NOTE — Telephone Encounter (Signed)
Refilled trazodone #30 with one refill.   

## 2013-06-15 ENCOUNTER — Encounter: Payer: Self-pay | Admitting: *Deleted

## 2013-06-16 ENCOUNTER — Ambulatory Visit (INDEPENDENT_AMBULATORY_CARE_PROVIDER_SITE_OTHER): Payer: 59 | Admitting: Internal Medicine

## 2013-06-16 ENCOUNTER — Encounter: Payer: Self-pay | Admitting: Internal Medicine

## 2013-06-16 VITALS — BP 120/70 | HR 62 | Temp 97.9°F | Ht 62.5 in | Wt 140.2 lb

## 2013-06-16 DIAGNOSIS — F411 Generalized anxiety disorder: Secondary | ICD-10-CM

## 2013-06-16 DIAGNOSIS — E7800 Pure hypercholesterolemia, unspecified: Secondary | ICD-10-CM

## 2013-06-16 DIAGNOSIS — I4891 Unspecified atrial fibrillation: Secondary | ICD-10-CM

## 2013-06-16 DIAGNOSIS — I10 Essential (primary) hypertension: Secondary | ICD-10-CM

## 2013-06-16 DIAGNOSIS — F419 Anxiety disorder, unspecified: Secondary | ICD-10-CM

## 2013-06-16 NOTE — Assessment & Plan Note (Addendum)
Blood pressure as outlined.  Doing well.  Off hctz.  Follow pressures closely.  Follow metabolic panel.  

## 2013-06-16 NOTE — Progress Notes (Signed)
Pre-visit discussion using our clinic review tool. No additional management support is needed unless otherwise documented below in the visit note.  

## 2013-06-16 NOTE — Assessment & Plan Note (Addendum)
Symptoms as outlined.  On prozac 30mg q day.  Follow closely.  Doing better.     

## 2013-06-16 NOTE — Assessment & Plan Note (Signed)
Sees Dr Gwen Pounds.  Currently in SR.  Follow.  Treat anxiety.

## 2013-06-16 NOTE — Progress Notes (Signed)
Subjective:    Patient ID: Kristin Avila, female    DOB: 02-Mar-1928, 79 y.o.   MRN: [id number]  HPI 79 year old female with past history of hypertension, hypercholesterolemia and osteoarthritis who comes in today for a scheduled follow up.  Was evaluated in the ER 06/13/12 for increased heart rate, palpitations and elevated blood pressure. Was found to be in afib.  Was treated with a beta blocker and converted to SR.  Saw Dr Gwen Pounds in follow up.  See his notes for details.  Atenolol was changed to Metoprolol.  Per his note, stress test revealed good exercise tolerance with no evidence of ischemia or rhythm disturbance.  ECHO revealed normal LV systolic function with moderate mitral and mild tricuspid regurgitation.  He recommended continuing daily aspirin.  She was subsequently admitted for observation for elevated blood pressure and blurred vision.  See previous notes for details.  MRI revealed possible small vessel ischemic changes but no acute change.  She did have some changes in the cervical cord.  Blood pressure medication was adjusted.  Dr Gwen Pounds stopped her metoprolol.   See his note for details.  She is also now off her diuretic.  Blood pressures at home have been averaging mostly 100-130s/60-70s.  The previous trembling has improved.  On prozac and she feels she is doing well.  Feels better.   No depression.  Eating and drinking well.  Is sleeping better on trazodone.       Past Medical History  Diagnosis Date  . Hypertension   . Hypercholesterolemia   . Anemia   . Osteoarthritis     hands, feet  . Atrial fibrillation 11/13    new onset 11/13    Outpatient Encounter Prescriptions as of 06/16/2013  Medication Sig  . amLODipine (NORVASC) 10 MG tablet TAKE ONE TABLET BY MOUTH EVERY DAY  . aspirin 81 MG tablet Take 81 mg by mouth daily.  . Cholecalciferol (VITAMIN D-3) 1000 UNITS CAPS Take by mouth.  Marland Kitchen FLUoxetine (PROZAC) 10 MG capsule Take 3 capsules (30 mg total) by mouth daily.   . hydrochlorothiazide (HYDRODIURIL) 25 MG tablet Take 1/2 tablet daily  . lisinopril (PRINIVIL,ZESTRIL) 40 MG tablet TAKE ONE TABLET BY MOUTH EVERY DAY  . lovastatin (MEVACOR) 20 MG tablet Take 1 tablet (20 mg total) by mouth daily.  . naproxen sodium (ANAPROX) 220 MG tablet Take 220 mg by mouth 2 (two) times daily with a meal.  . traZODone (DESYREL) 100 MG tablet TAKE ONE TABLET BY MOUTH AT BEDTIME    Review of Systems Patient denies any headache, lightheadedness or dizziness.  No sinus or allergy symptoms.  No chest pain, tightness or palpitations.  No associated symptoms.  No increased shortness of breath, cough or congestion.  No nausea or vomiting. No abdominal pain or cramping.  No bowel change, such as diarrhea, constipation, BRBPR or melana.  No urine change. On prozac.  Blood pressures as outlined.  Sleeping better with trazodone.  Feels better.   Overall she feels she is doing well.       Objective:   Physical Exam  Filed Vitals:   06/16/13 1054  BP: 120/70  Pulse: 62  Temp: 97.9 F (36.6 C)   Blood pressure recheck:  136/76, pulse 75  79 year old female in no acute distress.   HEENT:  Nares- clear.  Oropharynx - without lesions. NECK:  Supple.  Nontender.  No audible bruit.  HEART:  Appears to be regular. LUNGS:  No crackles or wheezing  audible.  Respirations even and unlabored.  RADIAL PULSE:  Equal bilaterally.  ABDOMEN:  Soft, nontender.  Bowel sounds present and normal.  No audible abdominal bruit.    EXTREMITIES:  No increased edema present.  DP pulses palpable and equal bilaterally.                      Assessment & Plan:  GI.  Colonoscopy 09/06/08 had to be stopped secondary to poor visualization.  Subsequently had a barium enema that revealed significant looping and tortuous colon.  Currently asymptomatic.  Recommended a follow up colonoscopy 2015.    CARDIOVASCULAR.  New diagnosis of afib recently.  Now in SR.  Off Metoprolol.  Seeing Dr Gwen Pounds.  Recent  stress test and ECHO as outlined.  Currently doing well.  Follow.  Per cardiology, continue daily aspirin.  Overall she feels her heart is stable.  Treat anxiety.   HEALTH MAINTENANCE.  Physical 03/27/13.   Mammogram 09/21/12- Birads II.  Colonoscopy as outlined.

## 2013-06-18 ENCOUNTER — Encounter: Payer: Self-pay | Admitting: Internal Medicine

## 2013-06-18 NOTE — Assessment & Plan Note (Signed)
Low cholesterol diet and exercise.  Continue lovastatin.  Follow lipid panel and liver function.   

## 2013-06-23 ENCOUNTER — Other Ambulatory Visit: Payer: Self-pay | Admitting: Internal Medicine

## 2013-07-14 ENCOUNTER — Other Ambulatory Visit: Payer: Self-pay | Admitting: Internal Medicine

## 2013-07-26 ENCOUNTER — Other Ambulatory Visit: Payer: Self-pay | Admitting: Internal Medicine

## 2013-07-26 NOTE — Telephone Encounter (Signed)
Refilled trazodone #30 with one refill.   

## 2013-07-26 NOTE — Telephone Encounter (Signed)
Ok refill? 

## 2013-08-22 ENCOUNTER — Other Ambulatory Visit: Payer: Self-pay | Admitting: Internal Medicine

## 2013-08-28 ENCOUNTER — Telehealth: Payer: Self-pay | Admitting: *Deleted

## 2013-08-28 ENCOUNTER — Encounter: Payer: Self-pay | Admitting: *Deleted

## 2013-08-29 ENCOUNTER — Encounter: Payer: Self-pay | Admitting: Internal Medicine

## 2013-08-29 ENCOUNTER — Other Ambulatory Visit: Payer: Self-pay | Admitting: Internal Medicine

## 2013-08-29 ENCOUNTER — Ambulatory Visit (INDEPENDENT_AMBULATORY_CARE_PROVIDER_SITE_OTHER): Payer: 59 | Admitting: Internal Medicine

## 2013-08-29 ENCOUNTER — Encounter (INDEPENDENT_AMBULATORY_CARE_PROVIDER_SITE_OTHER): Payer: Self-pay

## 2013-08-29 VITALS — BP 120/70 | HR 63 | Temp 97.7°F | Ht 62.25 in | Wt 141.8 lb

## 2013-08-29 DIAGNOSIS — F419 Anxiety disorder, unspecified: Secondary | ICD-10-CM

## 2013-08-29 DIAGNOSIS — E7800 Pure hypercholesterolemia, unspecified: Secondary | ICD-10-CM

## 2013-08-29 DIAGNOSIS — I10 Essential (primary) hypertension: Secondary | ICD-10-CM

## 2013-08-29 DIAGNOSIS — I4891 Unspecified atrial fibrillation: Secondary | ICD-10-CM

## 2013-08-29 DIAGNOSIS — M1990 Unspecified osteoarthritis, unspecified site: Secondary | ICD-10-CM

## 2013-08-29 DIAGNOSIS — F411 Generalized anxiety disorder: Secondary | ICD-10-CM

## 2013-08-29 NOTE — Progress Notes (Signed)
Order placed for labs.

## 2013-08-29 NOTE — Progress Notes (Signed)
Pre-visit discussion using our clinic review tool. No additional management support is needed unless otherwise documented below in the visit note.  

## 2013-08-29 NOTE — Telephone Encounter (Signed)
error 

## 2013-08-31 ENCOUNTER — Encounter: Payer: Self-pay | Admitting: Internal Medicine

## 2013-08-31 NOTE — Assessment & Plan Note (Signed)
Has seen Dr Kernodle.  Stable.  

## 2013-08-31 NOTE — Assessment & Plan Note (Signed)
Symptoms as outlined.  On prozac 30mg q day.  Follow closely.  Doing better.     

## 2013-08-31 NOTE — Assessment & Plan Note (Signed)
Sees Dr Kowalski.  Currently in SR.  Follow.   

## 2013-08-31 NOTE — Progress Notes (Signed)
Subjective:    Patient ID: Kristin Avila, female    DOB: 06-Aug-1928, 80 y.o.   MRN: [id number]  HPI 80 year old female with past history of hypertension, hypercholesterolemia and osteoarthritis who comes in today for a scheduled follow up.  Was evaluated in the ER 06/13/12 for increased heart rate, palpitations and elevated blood pressure. Was found to be in afib.  Was treated with a beta blocker and converted to SR.  Saw Dr Nehemiah Massed in follow up.  See his notes for details.  Atenolol was changed to Metoprolol.  Per his note, stress test revealed good exercise tolerance with no evidence of ischemia or rhythm disturbance.  ECHO revealed normal LV systolic function with moderate mitral and mild tricuspid regurgitation.  He recommended continuing daily aspirin.  She was subsequently admitted for observation for elevated blood pressure and blurred vision.  See previous notes for details.  MRI revealed possible small vessel ischemic changes but no acute change.  She did have some changes in the cervical cord.  Blood pressure medication was adjusted.  Dr Nehemiah Massed stopped her metoprolol.   See his note for details.  She is also now off her diuretic.  Blood pressures at home have been averaging mostly 100-130s/50-60s.  The previous trembling has improved.  On prozac and she feels she is doing well.  Feels better.   No depression.  Eating and drinking well.  Is sleeping better on trazodone.       Past Medical History  Diagnosis Date  . Hypertension   . Hypercholesterolemia   . Anemia   . Osteoarthritis     hands, feet  . Atrial fibrillation 11/13    new onset 11/13    Outpatient Encounter Prescriptions as of 08/29/2013  Medication Sig  . amLODipine (NORVASC) 10 MG tablet TAKE ONE TABLET BY MOUTH ONCE DAILY  . amoxicillin (AMOXIL) 500 MG tablet Take 500 mg by mouth. Take 4 tablet one hour Prior to dental procedures  . aspirin 81 MG tablet Take 81 mg by mouth daily.  . Cholecalciferol (VITAMIN D-3) 1000  UNITS CAPS Take by mouth.  Marland Kitchen FLUoxetine (PROZAC) 10 MG capsule TAKE THREE CAPSULES BY MOUTH ONCE DAILY  . lisinopril (PRINIVIL,ZESTRIL) 40 MG tablet TAKE ONE TABLET BY MOUTH ONCE DAILY  . lovastatin (MEVACOR) 20 MG tablet TAKE ONE TABLET BY MOUTH EVERY DAY  . naproxen sodium (ANAPROX) 220 MG tablet Take 220 mg by mouth 2 (two) times daily with a meal.  . traZODone (DESYREL) 100 MG tablet TAKE ONE TABLET BY MOUTH AT BEDTIME  . [DISCONTINUED] hydrochlorothiazide (HYDRODIURIL) 25 MG tablet Take 1/2 tablet daily    Review of Systems Patient denies any headache, lightheadedness or dizziness.  No sinus or allergy symptoms.  No chest pain, tightness or palpitations.  No associated symptoms.  No increased shortness of breath, cough or congestion.  No nausea or vomiting. No abdominal pain or cramping.  No bowel change, such as diarrhea, constipation, BRBPR or melana.  No urine change. On prozac.  Blood pressures as outlined.  Sleeping better with trazodone.  Feels better.   Overall she feels she is doing well.       Objective:   Physical Exam  Filed Vitals:   08/29/13 1008  BP: 120/70  Pulse: 63  Temp: 97.7 F (36.5 C)   Blood pressure recheck:  126/68, pulse 22  80 year old female in no acute distress.   HEENT:  Nares- clear.  Oropharynx - without lesions. NECK:  Supple.  Nontender.  No audible bruit.  HEART:  Appears to be regular. LUNGS:  No crackles or wheezing audible.  Respirations even and unlabored.  RADIAL PULSE:  Equal bilaterally.  ABDOMEN:  Soft, nontender.  Bowel sounds present and normal.  No audible abdominal bruit.    EXTREMITIES:  No increased edema present.  DP pulses palpable and equal bilaterally.                      Assessment & Plan:  GI.  Colonoscopy 09/06/08 had to be stopped secondary to poor visualization.  Subsequently had a barium enema that revealed significant looping and tortuous colon.  Currently asymptomatic.  Recommended a follow up colonoscopy 2015.   Discussed with her today.  Will think about f/u colonoscopy.    CARDIOVASCULAR.  With history of afib.   Now in SR.  Off Metoprolol.  Seeing Dr Nehemiah Massed.  Recent stress test and ECHO as outlined.  Currently doing well.  Follow.  Per cardiology, continue daily aspirin.  Overall she feels her heart is stable.  HEALTH MAINTENANCE.  Physical 03/27/13.   Mammogram 09/21/12- Birads II.  Colonoscopy as outlined.

## 2013-08-31 NOTE — Assessment & Plan Note (Signed)
Low cholesterol diet and exercise.  Continue lovastatin.  Follow lipid panel and liver function.   

## 2013-08-31 NOTE — Assessment & Plan Note (Signed)
Blood pressure as outlined.  Doing well.  Off hctz.  Follow pressures closely.  Follow metabolic panel.  

## 2013-09-06 ENCOUNTER — Other Ambulatory Visit (INDEPENDENT_AMBULATORY_CARE_PROVIDER_SITE_OTHER): Payer: 59

## 2013-09-06 DIAGNOSIS — E7800 Pure hypercholesterolemia, unspecified: Secondary | ICD-10-CM

## 2013-09-06 DIAGNOSIS — I10 Essential (primary) hypertension: Secondary | ICD-10-CM

## 2013-09-06 LAB — BASIC METABOLIC PANEL
BUN: 14 mg/dL (ref 6–23)
CHLORIDE: 103 meq/L (ref 96–112)
CO2: 30 meq/L (ref 19–32)
CREATININE: 0.7 mg/dL (ref 0.4–1.2)
Calcium: 9.8 mg/dL (ref 8.4–10.5)
GFR: 79.62 mL/min (ref >60.00)
Glucose, Bld: 87 mg/dL (ref 70–99)
POTASSIUM: 4.5 meq/L (ref 3.5–5.1)
Sodium: 140 mEq/L (ref 135–145)

## 2013-09-06 LAB — LDL CHOLESTEROL, DIRECT: LDL DIRECT: 105.8 mg/dL

## 2013-09-06 LAB — HEPATIC FUNCTION PANEL
ALBUMIN: 4 g/dL (ref 3.5–5.2)
ALK PHOS: 66 U/L (ref 39–117)
ALT: 13 U/L (ref 0–35)
AST: 22 U/L (ref 0–37)
Bilirubin, Direct: 0.1 mg/dL (ref 0.0–0.3)
TOTAL PROTEIN: 6.3 g/dL (ref 6.0–8.3)
Total Bilirubin: 0.7 mg/dL (ref 0.3–1.2)

## 2013-09-06 LAB — LIPID PANEL
CHOL/HDL RATIO: 2
Cholesterol: 210 mg/dL — ABNORMAL HIGH (ref 0–200)
HDL: 90.3 mg/dL (ref >39.00)
Triglycerides: 53 mg/dL (ref 0.0–149.0)
VLDL: 10.6 mg/dL (ref 0.0–40.0)

## 2013-09-07 ENCOUNTER — Encounter: Payer: Self-pay | Admitting: *Deleted

## 2013-09-07 ENCOUNTER — Other Ambulatory Visit: Payer: 59

## 2013-09-20 ENCOUNTER — Other Ambulatory Visit: Payer: Self-pay | Admitting: Internal Medicine

## 2013-09-20 NOTE — Telephone Encounter (Signed)
Refilled #[id number] with 2 refills trazodone

## 2013-09-20 NOTE — Telephone Encounter (Signed)
Ok to refill 

## 2013-09-25 ENCOUNTER — Ambulatory Visit: Payer: Self-pay | Admitting: Internal Medicine

## 2013-09-25 ENCOUNTER — Encounter: Payer: Self-pay | Admitting: Internal Medicine

## 2013-09-25 LAB — HM MAMMOGRAPHY: HM MAMMO: NEGATIVE

## 2013-09-27 ENCOUNTER — Encounter: Payer: Self-pay | Admitting: Internal Medicine

## 2013-11-21 ENCOUNTER — Other Ambulatory Visit: Payer: Self-pay | Admitting: Internal Medicine

## 2013-12-01 ENCOUNTER — Encounter: Payer: Self-pay | Admitting: Internal Medicine

## 2013-12-01 ENCOUNTER — Ambulatory Visit (INDEPENDENT_AMBULATORY_CARE_PROVIDER_SITE_OTHER): Payer: 59 | Admitting: Internal Medicine

## 2013-12-01 ENCOUNTER — Ambulatory Visit: Payer: 59 | Admitting: Internal Medicine

## 2013-12-01 ENCOUNTER — Encounter (INDEPENDENT_AMBULATORY_CARE_PROVIDER_SITE_OTHER): Payer: Self-pay

## 2013-12-01 VITALS — BP 130/70 | HR 62 | Temp 98.3°F | Ht 62.25 in | Wt 148.0 lb

## 2013-12-01 DIAGNOSIS — E7800 Pure hypercholesterolemia, unspecified: Secondary | ICD-10-CM

## 2013-12-01 DIAGNOSIS — M1990 Unspecified osteoarthritis, unspecified site: Secondary | ICD-10-CM

## 2013-12-01 DIAGNOSIS — F411 Generalized anxiety disorder: Secondary | ICD-10-CM

## 2013-12-01 DIAGNOSIS — F419 Anxiety disorder, unspecified: Secondary | ICD-10-CM

## 2013-12-01 DIAGNOSIS — I4891 Unspecified atrial fibrillation: Secondary | ICD-10-CM

## 2013-12-01 DIAGNOSIS — I10 Essential (primary) hypertension: Secondary | ICD-10-CM

## 2013-12-01 NOTE — Progress Notes (Signed)
Subjective:    Patient ID: Kristin Avila, female    DOB: 09-17-1927, 80 y.o.   MRN: [id number]  HPI 80 year old female with past history of hypertension, hypercholesterolemia and osteoarthritis who comes in today for a scheduled follow up.  Was evaluated in the ER 06/13/12 for increased heart rate, palpitations and elevated blood pressure. Was found to be in afib.  Was treated with a beta blocker and converted to SR.  Saw Dr Nehemiah Massed in follow up.  See his notes for details.  Atenolol was changed to Metoprolol.  Per his note, stress test revealed good exercise tolerance with no evidence of ischemia or rhythm disturbance.  ECHO revealed normal LV systolic function with moderate mitral and mild tricuspid regurgitation.  He recommended continuing daily aspirin.  She was subsequently admitted for observation for elevated blood pressure and blurred vision.  See previous notes for details.  MRI revealed possible small vessel ischemic changes but no acute change.  She did have some changes in the cervical cord.  Blood pressure medication was adjusted.  Dr Nehemiah Massed stopped her metoprolol.   See his note for details.  She is also now off her diuretic.  Blood pressures at home have been doing well. The previous trembling has improved.  On prozac and she feels she is doing well.  Feels better.   No depression.  Eating and drinking well.  Is sleeping better on trazodone.       Past Medical History  Diagnosis Date  . Hypertension   . Hypercholesterolemia   . Anemia   . Osteoarthritis     hands, feet  . Atrial fibrillation 11/13    new onset 11/13    Outpatient Encounter Prescriptions as of 12/01/2013  Medication Sig  . amLODipine (NORVASC) 10 MG tablet TAKE ONE TABLET BY MOUTH ONCE DAILY  . aspirin 81 MG tablet Take 81 mg by mouth daily.  . Cholecalciferol (VITAMIN D-3) 1000 UNITS CAPS Take by mouth.  Marland Kitchen FLUoxetine (PROZAC) 10 MG capsule TAKE THREE CAPSULES BY MOUTH ONCE DAILY  . lisinopril  (PRINIVIL,ZESTRIL) 40 MG tablet TAKE ONE TABLET BY MOUTH ONCE DAILY  . lovastatin (MEVACOR) 20 MG tablet TAKE ONE TABLET BY MOUTH EVERY DAY  . naproxen sodium (ANAPROX) 220 MG tablet Take 220 mg by mouth 2 (two) times daily with a meal.  . traZODone (DESYREL) 100 MG tablet TAKE ONE TABLET BY MOUTH AT BEDTIME  . [DISCONTINUED] amoxicillin (AMOXIL) 500 MG tablet Take 500 mg by mouth. Take 4 tablet one hour Prior to dental procedures    Review of Systems Patient denies any headache, lightheadedness or dizziness.  No sinus or allergy symptoms.  No chest pain, tightness or palpitations.  No associated symptoms.  No increased shortness of breath, cough or congestion.  No nausea or vomiting. No abdominal pain or cramping.  No bowel change, such as diarrhea, constipation, BRBPR or melana.  No urine change. On prozac.  Blood pressures as outlined.  Sleeping better with trazodone.  Feels better.   Overall she feels she is doing well.       Objective:   Physical Exam  Filed Vitals:   12/01/13 1448  BP: 130/70  Pulse: 62  Temp: 98.3 F (36.8 C)   Blood pressure recheck:  48/73  80 year old female in no acute distress.   HEENT:  Nares- clear.  Oropharynx - without lesions. NECK:  Supple.  Nontender.  No audible bruit.  HEART:  Appears to be regular. LUNGS:  No  crackles or wheezing audible.  Respirations even and unlabored.  RADIAL PULSE:  Equal bilaterally.  ABDOMEN:  Soft, nontender.  Bowel sounds present and normal.  No audible abdominal bruit.    EXTREMITIES:  No increased edema present.  DP pulses palpable and equal bilaterally.                      Assessment & Plan:  GI.  Colonoscopy 09/06/08 had to be stopped secondary to poor visualization.  Subsequently had a barium enema that revealed significant looping and tortuous colon.  Currently asymptomatic.  Recommended a follow up colonoscopy 2015.  Have discussed with her about f/u colonoscopy.  Will notify me when agreeable.     CARDIOVASCULAR.  With history of afib.   Now in SR.  Off Metoprolol.  Seeing Dr Nehemiah Massed.  Recent stress test and ECHO as outlined.  Currently doing well.  Follow.  Per cardiology, continue daily aspirin.  Overall she feels her heart is stable.  HEALTH MAINTENANCE.  Physical 03/27/13.   Mammogram 09/25/13- Birads I.  Colonoscopy as outlined.

## 2013-12-01 NOTE — Progress Notes (Signed)
Pre visit review using our clinic review tool, if applicable. No additional management support is needed unless otherwise documented below in the visit note. 

## 2013-12-03 ENCOUNTER — Encounter: Payer: Self-pay | Admitting: Internal Medicine

## 2013-12-03 NOTE — Assessment & Plan Note (Signed)
Symptoms as outlined.  On prozac 30mg q day.  Follow closely.  Doing better.     

## 2013-12-03 NOTE — Assessment & Plan Note (Signed)
Sees Dr Kowalski.  Currently in SR.  Follow.   

## 2013-12-03 NOTE — Assessment & Plan Note (Signed)
Blood pressure as outlined.  Doing well.  Off hctz.  Follow pressures closely.  Follow metabolic panel.  

## 2013-12-03 NOTE — Assessment & Plan Note (Signed)
Low cholesterol diet and exercise.  Continue lovastatin.  Follow lipid panel and liver function.   

## 2013-12-03 NOTE — Assessment & Plan Note (Signed)
Has seen Dr Kernodle.  Stable.  

## 2013-12-19 ENCOUNTER — Other Ambulatory Visit: Payer: Self-pay | Admitting: Internal Medicine

## 2014-01-11 ENCOUNTER — Other Ambulatory Visit: Payer: Self-pay | Admitting: Internal Medicine

## 2014-03-20 ENCOUNTER — Other Ambulatory Visit: Payer: Self-pay | Admitting: Internal Medicine

## 2014-03-30 ENCOUNTER — Other Ambulatory Visit (INDEPENDENT_AMBULATORY_CARE_PROVIDER_SITE_OTHER): Payer: Medicare Other

## 2014-03-30 DIAGNOSIS — E7800 Pure hypercholesterolemia, unspecified: Secondary | ICD-10-CM

## 2014-03-30 DIAGNOSIS — I10 Essential (primary) hypertension: Secondary | ICD-10-CM

## 2014-03-30 DIAGNOSIS — I4891 Unspecified atrial fibrillation: Secondary | ICD-10-CM

## 2014-03-30 LAB — CBC WITH DIFFERENTIAL/PLATELET
Basophils Absolute: 0 10*3/uL (ref 0.0–0.1)
Basophils Relative: 0.8 % (ref 0.0–3.0)
EOS ABS: 0.1 10*3/uL (ref 0.0–0.7)
Eosinophils Relative: 2.4 % (ref 0.0–5.0)
HCT: 41.2 % (ref 36.0–46.0)
Hemoglobin: 13.6 g/dL (ref 12.0–15.0)
LYMPHS ABS: 1.2 10*3/uL (ref 0.7–4.0)
LYMPHS PCT: 27.4 % (ref 12.0–46.0)
MCHC: 33 g/dL (ref 30.0–36.0)
MCV: 92.3 fl (ref 78.0–100.0)
Monocytes Absolute: 0.5 10*3/uL (ref 0.1–1.0)
Monocytes Relative: 11.7 % (ref 3.0–12.0)
Neutro Abs: 2.5 10*3/uL (ref 1.4–7.7)
Neutrophils Relative %: 57.7 % (ref 43.0–77.0)
Platelets: 206 10*3/uL (ref 150.0–400.0)
RBC: 4.46 Mil/uL (ref 3.87–5.11)
RDW: 14 % (ref 11.5–15.5)
WBC: 4.4 10*3/uL (ref 4.0–10.5)

## 2014-03-30 LAB — LIPID PANEL
CHOL/HDL RATIO: 2
Cholesterol: 180 mg/dL (ref 0–200)
HDL: 81.5 mg/dL (ref >39.00)
LDL Cholesterol: 89 mg/dL (ref 0–99)
NONHDL: 98.5
Triglycerides: 48 mg/dL (ref 0.0–149.0)
VLDL: 9.6 mg/dL (ref 0.0–40.0)

## 2014-03-30 LAB — HEPATIC FUNCTION PANEL
ALT: 11 U/L (ref 0–35)
AST: 24 U/L (ref 0–37)
Albumin: 4 g/dL (ref 3.5–5.2)
Alkaline Phosphatase: 62 U/L (ref 39–117)
BILIRUBIN DIRECT: 0.1 mg/dL (ref 0.0–0.3)
BILIRUBIN TOTAL: 0.7 mg/dL (ref 0.2–1.2)
Total Protein: 6.4 g/dL (ref 6.0–8.3)

## 2014-03-30 LAB — BASIC METABOLIC PANEL
BUN: 19 mg/dL (ref 6–23)
CHLORIDE: 104 meq/L (ref 96–112)
CO2: 29 mEq/L (ref 19–32)
Calcium: 9.6 mg/dL (ref 8.4–10.5)
Creatinine, Ser: 0.8 mg/dL (ref 0.4–1.2)
GFR: 72.67 mL/min (ref >60.00)
Glucose, Bld: 84 mg/dL (ref 70–99)
POTASSIUM: 4 meq/L (ref 3.5–5.1)
Sodium: 141 mEq/L (ref 135–145)

## 2014-03-30 LAB — TSH: TSH: 1.4 u[IU]/mL (ref 0.35–4.50)

## 2014-04-06 ENCOUNTER — Ambulatory Visit (INDEPENDENT_AMBULATORY_CARE_PROVIDER_SITE_OTHER): Payer: Medicare Other | Admitting: Internal Medicine

## 2014-04-06 ENCOUNTER — Encounter: Payer: Self-pay | Admitting: Internal Medicine

## 2014-04-06 VITALS — BP 124/70 | HR 63 | Temp 98.0°F | Ht 62.25 in | Wt 148.5 lb

## 2014-04-06 DIAGNOSIS — Z23 Encounter for immunization: Secondary | ICD-10-CM

## 2014-04-06 DIAGNOSIS — F411 Generalized anxiety disorder: Secondary | ICD-10-CM

## 2014-04-06 DIAGNOSIS — E7800 Pure hypercholesterolemia, unspecified: Secondary | ICD-10-CM

## 2014-04-06 DIAGNOSIS — I10 Essential (primary) hypertension: Secondary | ICD-10-CM

## 2014-04-06 DIAGNOSIS — F419 Anxiety disorder, unspecified: Secondary | ICD-10-CM

## 2014-04-06 DIAGNOSIS — I4891 Unspecified atrial fibrillation: Secondary | ICD-10-CM

## 2014-04-06 DIAGNOSIS — M1990 Unspecified osteoarthritis, unspecified site: Secondary | ICD-10-CM

## 2014-04-06 NOTE — Progress Notes (Signed)
Pre visit review using our clinic review tool, if applicable. No additional management support is needed unless otherwise documented below in the visit note. 

## 2014-04-06 NOTE — Progress Notes (Signed)
Subjective:    Patient ID: Kristin Avila, female    DOB: July 15, 1928, 80 y.o.   MRN: [id number]  HPI 80 year old female with past history of hypertension, hypercholesterolemia and osteoarthritis who comes in today to follow up on these issues as well as for a complete physical exam.  Was evaluated in the ER 06/13/12 for increased heart rate, palpitations and elevated blood pressure. Was found to be in afib.  Was treated with a beta blocker and converted to SR.  Saw Dr Nehemiah Massed in follow up.   Atenolol was changed to Metoprolol.  Per his note, stress test revealed good exercise tolerance with no evidence of ischemia or rhythm disturbance.  ECHO revealed normal LV systolic function with moderate mitral and mild tricuspid regurgitation.  He recommended continuing daily aspirin.  She was subsequently admitted for observation for elevated blood pressure and blurred vision.  See previous notes for details.  MRI revealed possible small vessel ischemic changes but no acute change.  She did have some changes in the cervical cord.  Blood pressure medication was adjusted.  Dr Nehemiah Massed stopped her metoprolol.   See his note for details.  She is also now off her diuretic.  Blood pressures at home have been doing well.  See attached list.  The previous trembling has improved.  On prozac and she feels she is doing well.  Feels better.   No depression.  Eating and drinking well.  Is sleeping better on trazodone.  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  Stays active.        Past Medical History  Diagnosis Date  . Hypertension   . Hypercholesterolemia   . Anemia   . Osteoarthritis     hands, feet  . Atrial fibrillation 11/13    new onset 11/13    Outpatient Encounter Prescriptions as of 04/06/2014  Medication Sig  . amLODipine (NORVASC) 10 MG tablet TAKE ONE TABLET BY MOUTH ONCE DAILY  . aspirin 81 MG tablet Take 81 mg by mouth daily.  . Cholecalciferol (VITAMIN D-3) 1000 UNITS CAPS Take by  mouth.  Marland Kitchen FLUoxetine (PROZAC) 10 MG capsule TAKE THREE CAPSULES BY MOUTH ONCE DAILY  . lisinopril (PRINIVIL,ZESTRIL) 40 MG tablet TAKE ONE TABLET BY MOUTH ONCE DAILY  . lovastatin (MEVACOR) 20 MG tablet TAKE ONE TABLET BY MOUTH ONCE DAILY  . naproxen sodium (ANAPROX) 220 MG tablet Take 220 mg by mouth 2 (two) times daily with a meal.  . traZODone (DESYREL) 100 MG tablet TAKE ONE TABLET BY MOUTH AT BEDTIME    Review of Systems Patient denies any headache, lightheadedness or dizziness.  No sinus or allergy symptoms.  No chest pain, tightness or palpitations.  No increased shortness of breath, cough or congestion.  No nausea or vomiting. No abdominal pain or cramping.  No bowel change, such as diarrhea, constipation, BRBPR or melana.  No urine change. On prozac.  Blood pressures as outlined.  Sleeping better with trazodone.  Feels better.   Overall she feels she is doing well.  Handling stress.     Objective:   Physical Exam  Filed Vitals:   04/06/14 1428  BP: 124/70  Pulse: 63  Temp: 98 F (36.7 C)   Blood pressure recheck:  98/69  80 year old female in no acute distress.   HEENT:  Nares- clear.  Oropharynx - without lesions. NECK:  Supple.  Nontender.  No audible bruit.  HEART:  Appears to be regular. LUNGS:  No crackles or wheezing audible.  Respirations even and unlabored.  RADIAL PULSE:  Equal bilaterally.    BREASTS:  No nipple discharge or nipple retraction present.  Could not appreciate any distinct nodules or axillary adenopathy.  ABDOMEN:  Soft, nontender.  Bowel sounds present and normal.  No audible abdominal bruit.  GU:  Not performed.    EXTREMITIES:  No increased edema present.  DP pulses palpable and equal bilaterally.            Assessment & Plan:  GI.  Colonoscopy 09/06/08 had to be stopped secondary to poor visualization.  Subsequently had a barium enema that revealed significant looping and tortuous colon.  Currently asymptomatic.  Recommended a follow up  colonoscopy 2015.  Have discussed with her about f/u colonoscopy.  Will notify me when agreeable.    CARDIOVASCULAR.  With history of afib.   Now in SR.  Off Metoprolol.  Seeing Dr Nehemiah Massed.  Stress test and ECHO as outlined.  Currently doing well.  Follow.  Per cardiology, continue daily aspirin.  Overall she feels her heart is stable.  HEALTH MAINTENANCE.  Physical today   Mammogram 09/25/13- Birads I.  Colonoscopy as outlined.

## 2014-04-08 ENCOUNTER — Encounter: Payer: Self-pay | Admitting: Internal Medicine

## 2014-04-08 NOTE — Assessment & Plan Note (Signed)
Blood pressure as outlined.  Doing well.  Off hctz.  Follow pressures closely.  Follow metabolic panel.

## 2014-04-08 NOTE — Assessment & Plan Note (Signed)
Low cholesterol diet and exercise.  Continue lovastatin.  Follow lipid panel and liver function.

## 2014-04-08 NOTE — Assessment & Plan Note (Signed)
Symptoms as outlined.  On prozac 30mg  q day.  Follow closely.  Doing better.

## 2014-04-08 NOTE — Assessment & Plan Note (Signed)
Has seen Dr Kernodle.  Stable.  

## 2014-04-08 NOTE — Assessment & Plan Note (Signed)
Sees Dr Nehemiah Massed.  Currently in SR.  Follow.

## 2014-06-17 ENCOUNTER — Other Ambulatory Visit: Payer: Self-pay | Admitting: Internal Medicine

## 2014-07-09 ENCOUNTER — Other Ambulatory Visit: Payer: Self-pay | Admitting: Internal Medicine

## 2014-07-17 ENCOUNTER — Other Ambulatory Visit: Payer: Self-pay | Admitting: Internal Medicine

## 2014-07-18 ENCOUNTER — Other Ambulatory Visit: Payer: Self-pay | Admitting: Internal Medicine

## 2014-08-17 ENCOUNTER — Other Ambulatory Visit: Payer: Self-pay | Admitting: Internal Medicine

## 2014-09-26 ENCOUNTER — Ambulatory Visit: Payer: Self-pay | Admitting: Internal Medicine

## 2014-09-26 LAB — HM MAMMOGRAPHY: HM Mammogram: NEGATIVE

## 2014-09-27 ENCOUNTER — Encounter: Payer: Self-pay | Admitting: *Deleted

## 2014-10-01 ENCOUNTER — Other Ambulatory Visit: Payer: Self-pay | Admitting: Internal Medicine

## 2014-10-08 ENCOUNTER — Other Ambulatory Visit (INDEPENDENT_AMBULATORY_CARE_PROVIDER_SITE_OTHER): Payer: Medicare Other

## 2014-10-08 DIAGNOSIS — I10 Essential (primary) hypertension: Secondary | ICD-10-CM

## 2014-10-08 DIAGNOSIS — E7800 Pure hypercholesterolemia, unspecified: Secondary | ICD-10-CM

## 2014-10-08 LAB — BASIC METABOLIC PANEL
BUN: 19 mg/dL (ref 6–23)
CALCIUM: 10 mg/dL (ref 8.4–10.5)
CHLORIDE: 105 meq/L (ref 96–112)
CO2: 29 meq/L (ref 19–32)
CREATININE: 0.78 mg/dL (ref 0.40–1.20)
GFR: 74.73 mL/min (ref >60.00)
Glucose, Bld: 82 mg/dL (ref 70–99)
POTASSIUM: 4.1 meq/L (ref 3.5–5.1)
SODIUM: 140 meq/L (ref 135–145)

## 2014-10-08 LAB — HEPATIC FUNCTION PANEL
ALBUMIN: 4.3 g/dL (ref 3.5–5.2)
ALT: 11 U/L (ref 0–35)
AST: 21 U/L (ref 0–37)
Alkaline Phosphatase: 71 U/L (ref 39–117)
Bilirubin, Direct: 0.1 mg/dL (ref 0.0–0.3)
TOTAL PROTEIN: 6.9 g/dL (ref 6.0–8.3)
Total Bilirubin: 0.4 mg/dL (ref 0.2–1.2)

## 2014-10-08 LAB — LIPID PANEL
Cholesterol: 190 mg/dL (ref 0–200)
HDL: 86.5 mg/dL (ref >39.00)
LDL Cholesterol: 95 mg/dL (ref 0–99)
NonHDL: 103.5
Total CHOL/HDL Ratio: 2
Triglycerides: 44 mg/dL (ref 0.0–149.0)
VLDL: 8.8 mg/dL (ref 0.0–40.0)

## 2014-10-09 ENCOUNTER — Encounter: Payer: Self-pay | Admitting: Internal Medicine

## 2014-10-09 ENCOUNTER — Ambulatory Visit (INDEPENDENT_AMBULATORY_CARE_PROVIDER_SITE_OTHER): Payer: Medicare Other | Admitting: Internal Medicine

## 2014-10-09 VITALS — BP 124/70 | HR 67 | Temp 98.0°F | Ht 62.25 in | Wt 147.0 lb

## 2014-10-09 DIAGNOSIS — I10 Essential (primary) hypertension: Secondary | ICD-10-CM

## 2014-10-09 DIAGNOSIS — I4891 Unspecified atrial fibrillation: Secondary | ICD-10-CM

## 2014-10-09 DIAGNOSIS — F419 Anxiety disorder, unspecified: Secondary | ICD-10-CM

## 2014-10-09 DIAGNOSIS — E7800 Pure hypercholesterolemia, unspecified: Secondary | ICD-10-CM

## 2014-10-09 DIAGNOSIS — Z0000 Encounter for general adult medical examination without abnormal findings: Secondary | ICD-10-CM

## 2014-10-09 MED ORDER — FLUOXETINE HCL 40 MG PO CAPS: 40.0000 mg | ORAL_CAPSULE | Freq: Every day | ORAL | Status: DC

## 2014-10-09 NOTE — Progress Notes (Signed)
Pre visit review using our clinic review tool, if applicable. No additional management support is needed unless otherwise documented below in the visit note. 

## 2014-10-09 NOTE — Patient Instructions (Signed)
Change the prozac to 40mg  - one per day.

## 2014-10-14 ENCOUNTER — Encounter: Payer: Self-pay | Admitting: Internal Medicine

## 2014-10-14 DIAGNOSIS — Z0000 Encounter for general adult medical examination without abnormal findings: Secondary | ICD-10-CM

## 2014-10-14 HISTORY — DX: Encounter for general adult medical examination without abnormal findings: Z00.00

## 2014-10-14 NOTE — Assessment & Plan Note (Addendum)
Low cholesterol diet and exercise.  On lovastatin.  Check lipid panel and liver function tests.  LDL - 88 - recent check.

## 2014-10-14 NOTE — Progress Notes (Signed)
Patient ID: Kristin Avila, female   DOB: 1927/09/29, 81 y.o.   MRN: [id number]   Subjective:    Patient ID: Kristin Avila, female    DOB: 1927-10-04, 81 y.o.   MRN: [id number]  HPI  Patient here for a scheduled follow up.  States she is doing well.  Still with some increased stress and anxiety.  Overall doing well.  Feels she may need to increase the prozac.  Tries to stay active.  No cardiac symptoms with increased activity or exertion.  Breathing stable.  Eating and drinking well.  Bowels stable.     Past Medical History  Diagnosis Date  . Hypertension   . Hypercholesterolemia   . Anemia   . Osteoarthritis     hands, feet  . Atrial fibrillation 11/13    new onset 11/13    Current Outpatient Prescriptions on File Prior to Visit  Medication Sig Dispense Refill  . amLODipine (NORVASC) 10 MG tablet TAKE ONE TABLET BY MOUTH ONCE DAILY 30 tablet 5  . aspirin 81 MG tablet Take 81 mg by mouth daily.    . Cholecalciferol (VITAMIN D-3) 1000 UNITS CAPS Take by mouth.    Marland Kitchen lisinopril (PRINIVIL,ZESTRIL) 40 MG tablet TAKE ONE TABLET BY MOUTH ONCE DAILY 30 tablet 5  . lovastatin (MEVACOR) 20 MG tablet TAKE ONE TABLET BY MOUTH ONCE DAILY 90 tablet 0  . naproxen sodium (ANAPROX) 220 MG tablet Take 220 mg by mouth 2 (two) times daily with a meal.    . traZODone (DESYREL) 100 MG tablet TAKE ONE TABLET BY MOUTH AT BEDTIME 30 tablet 2   No current facility-administered medications on file prior to visit.    Review of Systems  Constitutional: Negative for appetite change and unexpected weight change.  HENT: Negative for congestion and sinus pressure.   Respiratory: Negative for cough, chest tightness and shortness of breath.   Cardiovascular: Negative for chest pain, palpitations and leg swelling.  Gastrointestinal: Negative for nausea, vomiting, abdominal pain and diarrhea.  Genitourinary: Negative for dysuria and difficulty urinating.  Musculoskeletal: Positive for back pain (doing well.   pain controlled.  ). Negative for joint swelling.  Skin: Negative for color change and rash.  Neurological: Negative for dizziness, light-headedness and headaches.  Psychiatric/Behavioral:       Feels handling stress relatively well.  Feels may need to increase prozac some.  No depression.         Objective:     Pulse recheck: 60  Physical Exam  Constitutional: She appears well-developed and well-nourished. No distress.  HENT:  Nose: Nose normal.  Mouth/Throat: Oropharynx is clear and moist.  Neck: Neck supple. No thyromegaly present.  Cardiovascular: Normal rate and regular rhythm.   Pulmonary/Chest: Breath sounds normal. No respiratory distress. She has no wheezes.  Abdominal: Soft. Bowel sounds are normal. There is no tenderness.  Musculoskeletal: She exhibits no edema or tenderness.  Lymphadenopathy:    She has no cervical adenopathy.  Skin: No rash noted. No erythema.  Psychiatric: She has a normal mood and affect. Her behavior is normal.    BP 124/70 mmHg  Pulse 67  Temp(Src) 98 F (36.7 C) (Oral)  Ht 5' 2.25" (1.581 m)  Wt 147 lb (66.679 kg)  BMI 26.68 kg/m2  SpO2 97% Wt Readings from Last 3 Encounters:  10/09/14 147 lb (66.679 kg)  04/06/14 148 lb 8 oz (67.359 kg)  12/01/13 148 lb (67.132 kg)     Lab Results  Component Value Date  WBC 4.4 03/30/2014   HGB 13.6 03/30/2014   HCT 41.2 03/30/2014   PLT 206.0 03/30/2014   GLUCOSE 82 10/08/2014   CHOL 190 10/08/2014   TRIG 44.0 10/08/2014   HDL 86.50 10/08/2014   LDLDIRECT 105.8 09/06/2013   LDLCALC 95 10/08/2014   ALT 11 10/08/2014   AST 21 10/08/2014   NA 140 10/08/2014   K 4.1 10/08/2014   CL 105 10/08/2014   CREATININE 0.78 10/08/2014   BUN 19 10/08/2014   CO2 29 10/08/2014   TSH 1.40 03/30/2014       Assessment & Plan:   Problem List Items Addressed This Visit    Anxiety    Overall doing well.  Still with some anxiety.  Feels may need to increase prozac slightly.  Will increase to 40mg  q  day.  Continues on trazodone for sleep.  Follow.  Get her back in soon to reassess.        Relevant Medications   FLUOXETINE HCL 40 MG PO CAPS   Atrial fibrillation    Sees Dr Nehemiah Massed.  Heart rate controlled.  Follow.        Health care maintenance    Mammogram 09/26/14 - Birads I.  04/06/14 - physical.        Hypercholesterolemia    Low cholesterol diet and exercise.  On lovastatin.  Check lipid panel and liver function tests.  LDL - 88 - recent check.        Hypertension - Primary    Blood pressure doing well on outside checks.  Same medication regimen.  Follow pressures.          I spent 25 minutes with the patient and more than 50% of the time was spent in consultation regarding the above.     Einar Pheasant, MD

## 2014-10-14 NOTE — Assessment & Plan Note (Signed)
Mammogram 09/26/14 - Birads I.  04/06/14 - physical.

## 2014-10-14 NOTE — Assessment & Plan Note (Signed)
Blood pressure doing well on outside checks.  Same medication regimen.  Follow pressures.

## 2014-10-14 NOTE — Assessment & Plan Note (Signed)
Sees Dr Nehemiah Massed.  Heart rate controlled.  Follow.

## 2014-10-14 NOTE — Assessment & Plan Note (Signed)
Overall doing well.  Still with some anxiety.  Feels may need to increase prozac slightly.  Will increase to 40mg  q day.  Continues on trazodone for sleep.  Follow.  Get her back in soon to reassess.

## 2014-11-25 ENCOUNTER — Other Ambulatory Visit: Payer: Self-pay | Admitting: Internal Medicine

## 2014-11-27 NOTE — Consult Note (Signed)
PATIENT NAME:  Kristin Avila, Kristin Avila MR#:  [id number] DATE OF BIRTH:  [birth year]  DATE OF CONSULTATION:  06/13/2012  PRIMARY CARE PHYSICIAN: Einar Pheasant, MD.   CONSULTING PHYSICIAN:  Tana Conch. Wieting, MD  REASON FOR CONSULTATION: Atrial fibrillation, hypertension, and hyperlipidemia.   CHIEF COMPLAINT: "I had a heart rate."   HISTORY OF PRESENT ILLNESS: This is an 81 year old female with known hypertension, hyperlipidemia, well treated on appropriate medications in the past. The patient has been stable with no evidence of new onset of chest discomfort or other congestive heart failure when she has had a new onset of palpitations and irregularity of heartbeat. The patient has had some new concerns and was seen in the Emergency Room. At that time an EKG shows atrial fibrillation, rapid ventricular rate and nonspecific ST changes. The patient was given intravenous Cardizem and had conversion to normal sinus rhythm. She immediately felt much better and has had no further symptoms with ambulation at this time. The patient heart rate now is 60 beats a minute.   REVIEW OF SYSTEMS: Negative for vision change, ringing in the ears, hearing loss, cough, congestion, heartburn, nausea, vomiting, diarrhea, bloody stools, stomach pain, extremity pain, leg weakness, cramping of the buttocks, known blood clots, headaches, blackouts, dizzy spells, nosebleeds, congestion, trouble swallowing, frequent urination, urination at night, muscle weakness, numbness, anxiety, depression, skin lesions, or skin rashes.   PAST MEDICAL HISTORY:  1. Hypertension.  2. Hyperlipidemia.   FAMILY HISTORY: No family members with early onset of cardiovascular disease or hypertension although her father did have a myocardial infarction at a later date.  SOCIAL HISTORY: Currently denies alcohol or tobacco use.   ALLERGIES: No known drug allergies.   CURRENT MEDICATIONS: As listed.   PHYSICAL EXAMINATION:  VITAL SIGNS: Blood  pressure 110/60 bilaterally, heart rate 60 upright, reclining, and regular.   GENERAL: She is a well appearing female in no acute distress.   HEENT: No icterus, thyromegaly, ulcers, hemorrhage, or xanthelasma.   HEART: Regular rate and rhythm with normal S1 and S2 without murmur, gallop, or rub. Point of maximal impulse is normal size and placement. Carotid upstroke normal without bruit. Jugular venous pressure is normal.   LUNGS: Lungs have few basilar crackles with normal respirations.   ABDOMEN: Soft, nontender, without hepatosplenomegaly or masses. Abdominal aorta is normal size without bruit.   EXTREMITIES: 2+ bilateral pulses in dorsal, pedal, radial, and femoral arteries without lower extremity edema, cyanosis, clubbing, or ulcers.   NEUROLOGIC: She is oriented to time, place, and person with normal mood and affect.   ASSESSMENT: An 81 year old female with hypertension, hyperlipidemia with new onset of atrial fibrillation with rapid ventricular rate now in normal sinus rhythm without evidence of myocardial infarction or congestive heart failure feeling well.   RECOMMENDATIONS:  1. Anticoagulation with aspirin alone due to minimal risk factors and maintenance of normal sinus rhythm at this time with ambulation. 2. Would consider anticoagulation if the patient has recurrent atrial fibrillation.  3. Metoprolol 25 mg 1/2 tablet twice per day to reduce possible recurrent atrial fibrillation.  4. Would consider follow-up in one day for further adjustments of these medications and treatment options.  5. Echocardiogram for LV systolic dysfunction, valvular heart disease.      6. Further  assessment, laboratory-wise with consideration of hypertension, hyperlipidemia and/or thyroid causing above.     ____________________________ Corey Skains, MD [id number] D: 06/13/2012 09:38:25 ET T: 06/13/2012 10:23:29 ET JOB#: [id number]  cc: Corey Skains, MD, <Dictator> Everlean Cherry  Nehemiah Massed  MD ELECTRONICALLY SIGNED 06/16/2012 13:45

## 2014-11-30 NOTE — H&P (Signed)
PATIENT NAME:  Kristin Avila, [place] MR#:  [id number] DATE OF BIRTH:  1927/09/20  DATE OF ADMISSION:  08/20/2012  CHIEF COMPLAINT: Vertigo and hypertensive urgency.   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 She states that she woke up this morning feeling fine and was not exerting herself very strenuously during the morning, but suddenly had a situation where "everything went haywire," using her own words. She is denying a sensation of vertigo, but felt like her vision was slightly blurred and nothing was making sense to her. Since this happened at the fire station, EMS was immediately called. Her blood pressure was apparently "sky high." She had taken her antihypertensives this morning, but she also took 2 Aleve, as she does every day for treatment of osteoarthritis. The episode of altered mental status lasted approximately an hour and a half. It was still present when she arrived at the Emergency Room, but has since resolved. In the Emergency Room, she was given aspirin 324 mg and amlodipine 5 mg for systolic hypertension. Her blood pressure in supine position was 189/86, pulse 57, and standing blood pressure was 197/89, pulse 60. The patient denies any recent history of chest pain, shortness of breath or palpitations. She states that on her ER visit in November, she had palpitations and a gripping feeling in her chest, but has been experiencing none of those today.   She was evaluated by Dr. Nehemiah Massed after her ER visit in November with an echocardiogram and a treadmill stress test. Stress test was normal. There were no ischemic changes. Echocardiogram showed normal left  ventricular ejection fraction, no wall motion abnormalities, moderate mitral regurgitation and mild tricuspid regurgitation. She was placed on baby aspirin and metoprolol at 25 mg twice daily. She has been taking these medications as prescribed. She has since been followed up with Dr. Einar Pheasant, her PCP, on November 30 and at that time, her blood pressure was 140/79.   PRIMARY CARE PHYSICIAN: Dr. Einar Pheasant of [facility] at [facility].   PAST MEDICAL HISTORY:  1.  Hypertension.  2.  History of atrial fibrillation with spontaneous conversion, 06/23/2012.  3.  Hyperlipidemia.  4.  Osteoarthritis of hands and feet.  5.  Anemia.   CURRENT MEDICATIONS: Aspirin 81 mg daily, vitamin D 1000 units daily, hydrochlorothiazide 25 mg daily, lisinopril 40 mg daily, lovastatin 20 mg at bedtime, metoprolol titrate 25 mg twice daily, naproxen 220 mg twice daily, trazodone 100 mg daily and Tylenol 650 mg by mouth every 8 hours as needed for pain.   PAST SURGICAL HISTORY: Notable for a remote appendectomy and a remote bilateral tubal ligation. She has had a recent podiatric surgery on left foot over 2 year ago. Last hospitalization was remote for surgery.   ALLERGIES: She has no known drug allergies.   FAMILY HISTORY: Mother died at age 72 of multiple myeloma. Father died at age 22 of congestive heart failure.   SOCIAL HISTORY: She has been widowed for 21 years. She is a nonsmoker. She is an occasional social drinker. She was living with her daughter until recently when her daughter passed away this past 2023/05/16. She has 2 sons and another daughter who are  nearby. Sons are present today.   REVIEW OF SYSTEMS:  CONSTITUTIONAL: Negative for fever, fatigue, weakness, pain and weight changes.  EYES: She has no recent episodes of double vision, but does note that her vision was blurred today when she had her episode of altered mental status. She does have a history of recent cataract  surgery on the right eye.  ENT: She denies tinnitus, hearing loss, seasonal rhinitis, postnasal drip, any sinus issues currently. She has no difficulty swallowing and no odynophagia.  RESPIRATORY: She has no history of cough, wheezing, hemoptysis or dyspnea. She has no history of painful respirations and no history of COPD.  CARDIOVASCULAR: She denies any history of chest pain, orthopnea, edema or dyspnea on exertion. She did have a history of arrhythmia, which has resolved.  GASTROINTESTINAL: She denies any nausea, vomiting, diarrhea, abdominal pain or changes in bowel habits.  GENITOURINARY: She denies any dysuria, hematuria, frequency, incontinence or suprapubic pressure.  ENDOCRINE: She has no history of polyuria, nocturia or thyroid problems.  HEMATOLOGIC: She has a remote history of anemia, but no easy bruising or bleeding.  SKIN: She has no history of recent skin changes.  MUSCULOSKELETAL: She does have chronic ankle pain secondary to arthritis.  NEUROLOGIC: She denies any history of numbness, weakness, dysarthria, true vertigo, ataxia or headache.  PSYCHIATRIC: She has a history of insomnia treated with trazodone, but no history of anxiety or depression.   PHYSICAL EXAMINATION:  GENERAL: This is a well nourished elderly female who looks younger than her stated age.  VITAL SIGNS: Blood pressure is 222/80; this is after an additional 5 mg of amlodipine given by the ER doctor. Pulse is 60. Rhythm is regular. Temperature is normal. Respirations are 18. She is satting 99% on room air.  HEENT: Pupils are equal, round and reactive to light. Extraocular movements are intact. Oropharynx is benign.  NECK: Supple without lymphadenopathy, thyromegaly or carotid bruits.  LUNGS: Clear to auscultation bilaterally with no wheezing or rhonchi.  CARDIOVASCULAR: Regular rate and rhythm. No murmurs, rubs or gallops. Chest wall is nontender. Femoral pulses are palpable. There is no lower extremity edema.   ABDOMEN: Soft, nontender and nondistended with good bowel sounds and no evidence of hepatosplenomegaly.  MUSCULOSKELETAL: Strength is 5/5 in all 4 extremities. Sensation is intact.  SKIN: Warm and dry without rashes or lesions.  LYMPHATIC: There is no cervical, axillary, inguinal or supraclavicular lymphadenopathy.  NEUROLOGICAL: Cranial nerves II through XII are intact. Deep tendon reflexes are intact. Sensation is intact to light touch. There is no dysarthria or aphasia. Finger-to-nose is normal. Fields of vision are all intact. She is alert and oriented to person, place and time.   ADMISSION LABORATORY DATA: Sodium 139, potassium 3.5, chloride 104, bicarbonate 27, BUN 19, creatinine 0.72, glucose 94. White count 5.9, hemoglobin 13.7, platelets 188. Liver function tests are normal. CPK is 121, MB is 1.6 and troponin I is less than 0.02. EKG shows sinus bradycardia. There is a Q wave noted in lead V2 only. The patient does appear to have met criteria for LVH. Noncontrasted head CT shows no signs of hemorrhagic stroke or prior strokes.   ASSESSMENT AND PLAN:  1.  Altered mental status: Transient episode of altered mental status may have been due to hypertensive urgency versus transient ischemic attack. Will admit for risk stratification. She has had an echo within the last 30 days so this will not be repeated. Carotid Dopplers and MRI of brain ordered.  2.  Hypertensive urgency: The  patient has not responded to amlodipine given in the Emergency Department. Hydralazine IV has been ordered. She will have to be admitted to the Intensive Care Unit given her systolic of greater than 990. I suspect that the Aleve may be contributing to her elevated blood pressures. However, given the fact that she is now on 4 agents, she needs to have renal artery Dopplers done to rule out renal artery stenosis as a cause.  3.  Hyperlipidemia: Fasting lipids ordered for in the morning.  4.  History of atrial fibrillation  with spontaneous conversion: The patient is currently bradycardic so the metoprolol is on hold. She has normal ejection fraction by very recent echocardiogram done by Dr. Nehemiah Massed in November at his clinic.  5.  Insomnia: Continue trazodone.  6.  Osteoarthritis: Holding all nonsteroidal anti-inflammatories and using tramadol as needed.   ESTIMATED TIME OF CARE: 60 minutes.  ____________________________ Deborra Medina, MD tt:jm D: 08/20/2012 13:38:04 ET T: 08/20/2012 15:07:29 ET JOB#: [id number]  cc: Deborra Medina, MD, <Dictator> Deborra Medina MD ELECTRONICALLY SIGNED 09/19/2012 19:40

## 2014-11-30 NOTE — Op Note (Signed)
PATIENT NAME:  Kristin Avila, Kristin Avila MR#:  [id number] DATE OF BIRTH:  11/12/1927  DATE OF PROCEDURE:  08/09/2012  LOCATION:  [facility]  PREOPERATIVE DIAGNOSIS: Visually significant cataract of the right eye.   POSTOPERATIVE DIAGNOSIS: Visually significant cataract of the right eye.   OPERATIVE PROCEDURE: Cataract extraction by phacoemulsification with implant of intraocular lens to right eye.   SURGEON: Birder Robson, MD.   ANESTHESIA:  1. Managed anesthesia care.  2. Topical tetracaine drops followed by 2% Xylocaine jelly applied in the preoperative holding area.   COMPLICATIONS: None.   TECHNIQUE:  Stop and chop.  DESCRIPTION OF PROCEDURE: The patient was examined and consented in the preoperative holding area where the aforementioned topical anesthesia was applied to the right eye and then brought back to the Operating Room where the right eye was prepped and draped in the usual sterile ophthalmic fashion and a lid speculum was placed. A paracentesis was created with the side port blade and the anterior chamber was filled with viscoelastic. A near clear corneal incision was performed with the steel keratome. A continuous curvilinear capsulorrhexis was performed with a cystotome followed by the capsulorrhexis forceps. Hydrodissection and hydrodelineation were carried out with BSS on a blunt cannula. The lens was removed in a stop and chop technique and the remaining cortical material was removed with the irrigation-aspiration handpiece. The capsular bag was inflated with viscoelastic and the Tecnis ZCB00 20.0-diopter lens, serial number [id number] was placed in the capsular bag without complication. The remaining viscoelastic was removed from the eye with the irrigation-aspiration handpiece. The wounds were hydrated. The anterior chamber was flushed with Miostat and the eye was inflated to physiologic pressure. The wounds were found to be water tight. The eye was dressed with Vigamox  and Combigan. The patient was given protective glasses to wear throughout the day and a shield with which to sleep tonight. The patient was also given drops with which to begin a drop regimen today and will follow-up with me in one day.     ____________________________ Livingston Diones. Karliah Kowalchuk, MD wlp:es D: 08/09/2012 13:33:03 ET T: 08/09/2012 14:02:10 ET JOB#: [id number]  cc: Hephzibah Strehle L. Krina Mraz, MD, <Dictator> Livingston Diones Jamy Whyte MD ELECTRONICALLY SIGNED 08/09/2012 16:30

## 2014-11-30 NOTE — Discharge Summary (Signed)
PATIENT NAME:  Kristin Avila, Kristin Avila MR#:  [id number] DATE OF BIRTH:  [birth year]  DATE OF ADMISSION:  08/20/2012 DATE OF DISCHARGE:  08/21/2012  PRIMARY CARE PHYSICIAN: Einar Pheasant, MD  PRESENTING COMPLAINT: Altered mental status.   DISCHARGE DIAGNOSES: 1. Transient altered mental status due to suspected malignant hypertension and mild encephalopathy, resolved.  2. Malignant/accelerated hypertension, improved.  3. Hyperlipidemia.   CODE STATUS: FULL CODE.   CONDITION ON DISCHARGE: Fair. Blood pressure is 118/53, pulse is 82 and sats are more than 92% on room air.   DISCHARGE MEDICATIONS: 1. Amlodipine 10 mg daily.  2. Lisinopril 30 mg 2 times a day. 3. Aleve 220 mg two tablets once a day, only as needed. The patient is advised to use it very sparingly since it can cause elevated blood pressure.  4. Trazodone 50 mg 2 tablets at bedtime.  5. PreserVision 1 tablet daily.  6. Aspirin 81 mg daily.  7. Lovastatin 20 mg daily. 8. Hydrochlorothiazide 12.5 mg p.o. daily.  DISCHARGE DIET: Low sodium.   DISCHARGE FOLLOW-UP/INSTRUCTIONS:   1. Dr. Einar Pheasant in one week. 2. Keep log of blood pressure at home. Call PCP if BP is greater than 993 systolic or diastolic greater than 570 with two to three readings at a time. 3. Get renal duplex ultrasound as outpatient.   LABS/RADIOLOGIC STUDIES: At discharge basic metabolic panel is within normal limits. Potassium 3.4. Lipid profile within normal limits. Cardiac enzymes x 3 negative.   Ultrasound, carotid Doppler, was negative. No hemodynamically significant stenosis. Small amount of calcified and soft plaque in the carotid systems.   Urinalysis negative for UTI.   Urine culture negative.   CT of the head is normal, noncontrast CT.  CBC within normal limits. Comprehensive metabolic panel within normal limits.   BRIEF SUMMARY OF HOSPITAL COURSE: Kristin Avila is an 81 year old very pleasant, active, independent female with history of  hypertension and hyperlipidemia who comes to the Emergency Room with: 1. Altered mental status. The patient had transient episode of altered mental status secondary to hypertensive urgency. She was admitted in the intensive care unit, kept for monitoring of her blood pressure. She did not have to be started on any drips. She was given an extra dose of amlodipine which brought her blood pressure down. Her dose of amlodipine was increased to 10 mg. She was continued on her lisinopril and hydrochlorothiazide as before. The patient's carotid Doppler remained negative, but had mild plaque. CT of the head was negative. Aspirin was continued. No indications for MRI since neurologically the patient had no neuro deficit and symptoms were due to uncontrolled blood pressure. Blood pressure prior to discharge was 118/53. 2. Hypertensive urgency. The patient was continued on her amlodipine, the dose was increased, along with her ACE inhibitors and hydrochlorothiazide.  3. Hyperlipidemia. Lipid profile is normal.  4. History of atrial fibrillation with spontaneous conversion a few weeks ago. The patient was in sinus rhythm and at times she was bradycardic. She had a normal ejection fraction by recent echocardiogram done by Dr. Nehemiah Massed, in November, in his clinic. 5. Insomnia. Continue Trazodone. 6. Osteoarthritis. NSAIDs were held. She takes Aleve. The patient was advised to take it very sparingly given elevated blood pressure.  7. The patient ambulated well in the intensive care unit, was feeling back to her baseline. Discharge plan was discussed with the patient's daughter, Kristin Avila. She will follow up with Dr. Einar Pheasant and Dr. Nehemiah Massed in the next couple of weeks.  TIME SPENT:  40 minutes.  ___________________________ Hart Rochester Posey Pronto, MD sap:sb D: 08/22/2012 07:21:30 ET T: 08/22/2012 10:35:51 ET JOB#: [id number]  cc: Tycho Cheramie A. Posey Pronto, MD, <Dictator> Einar Pheasant, MD Corey Skains, MD  Ilda Basset  MD ELECTRONICALLY SIGNED 08/23/2012 7:27

## 2014-12-31 ENCOUNTER — Other Ambulatory Visit: Payer: Self-pay | Admitting: Internal Medicine

## 2015-01-11 ENCOUNTER — Ambulatory Visit (INDEPENDENT_AMBULATORY_CARE_PROVIDER_SITE_OTHER): Payer: Medicare Other | Admitting: Internal Medicine

## 2015-01-11 ENCOUNTER — Encounter: Payer: Self-pay | Admitting: Internal Medicine

## 2015-01-11 VITALS — BP 110/60 | HR 66 | Temp 98.5°F | Ht 62.25 in | Wt 144.0 lb

## 2015-01-11 DIAGNOSIS — F419 Anxiety disorder, unspecified: Secondary | ICD-10-CM

## 2015-01-11 DIAGNOSIS — I4891 Unspecified atrial fibrillation: Secondary | ICD-10-CM | POA: Diagnosis not present

## 2015-01-11 DIAGNOSIS — K5900 Constipation, unspecified: Secondary | ICD-10-CM

## 2015-01-11 DIAGNOSIS — I10 Essential (primary) hypertension: Secondary | ICD-10-CM | POA: Diagnosis not present

## 2015-01-11 DIAGNOSIS — E7800 Pure hypercholesterolemia, unspecified: Secondary | ICD-10-CM

## 2015-01-11 DIAGNOSIS — M1990 Unspecified osteoarthritis, unspecified site: Secondary | ICD-10-CM

## 2015-01-11 DIAGNOSIS — Z0000 Encounter for general adult medical examination without abnormal findings: Secondary | ICD-10-CM

## 2015-01-11 NOTE — Progress Notes (Signed)
Pre visit review using our clinic review tool, if applicable. No additional management support is needed unless otherwise documented below in the visit note. 

## 2015-01-11 NOTE — Progress Notes (Signed)
Patient ID: JEANNY RYMER, female   DOB: September 20, 1927, 81 y.o.   MRN: [id number]   Subjective:    Patient ID: LIZVETTE LIGHTSEY, female    DOB: 07/06/1928, 82 y.o.   MRN: [id number]  HPI  Patient here for a scheduled follow up.  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  Enjoys this.  Feels better.  Anxiety better.  Eating and drinking well.  Stays active.  No cardiac symptoms with increased activity or exertion.  Reports some constipation.  States still has bm daily.     Past Medical History  Diagnosis Date  . Hypertension   . Hypercholesterolemia   . Anemia   . Osteoarthritis     hands, feet  . Atrial fibrillation 11/13    new onset 11/13    Current Outpatient Prescriptions on File Prior to Visit  Medication Sig Dispense Refill  . amLODipine (NORVASC) 10 MG tablet TAKE ONE TABLET BY MOUTH ONCE DAILY 30 tablet 5  . aspirin 81 MG tablet Take 81 mg by mouth daily.    . Cholecalciferol (VITAMIN D-3) 1000 UNITS CAPS Take by mouth.    Marland Kitchen FLUoxetine (PROZAC) 40 MG capsule Take 1 capsule (40 mg total) by mouth daily. 30 capsule 3  . lisinopril (PRINIVIL,ZESTRIL) 40 MG tablet TAKE ONE TABLET BY MOUTH ONCE DAILY 30 tablet 5  . lovastatin (MEVACOR) 20 MG tablet TAKE ONE TABLET BY MOUTH ONCE DAILY 90 tablet 1  . naproxen sodium (ANAPROX) 220 MG tablet Take 220 mg by mouth 2 (two) times daily with a meal.    . traZODone (DESYREL) 100 MG tablet TAKE ONE TABLET BY MOUTH AT BEDTIME 30 tablet 2   No current facility-administered medications on file prior to visit.    Review of Systems  Constitutional: Negative for appetite change and unexpected weight change.  HENT: Negative for congestion and sinus pressure.   Respiratory: Negative for cough, chest tightness and shortness of breath.   Cardiovascular: Negative for chest pain, palpitations and leg swelling.  Gastrointestinal: Positive for constipation (as outlined.  ). Negative for nausea, vomiting, abdominal pain and diarrhea.    Neurological: Negative for dizziness, light-headedness and headaches.  Psychiatric/Behavioral: Negative for dysphoric mood and agitation.       Objective:    Physical Exam  Constitutional: She appears well-developed and well-nourished. No distress.  HENT:  Nose: Nose normal.  Mouth/Throat: Oropharynx is clear and moist.  Neck: Neck supple. No thyromegaly present.  Cardiovascular: Normal rate and regular rhythm.   Pulmonary/Chest: Breath sounds normal. No respiratory distress. She has no wheezes.  Abdominal: Soft. Bowel sounds are normal. There is no tenderness.  Musculoskeletal: She exhibits no edema or tenderness.  Lymphadenopathy:    She has no cervical adenopathy.  Skin: No rash noted. No erythema.  Psychiatric: She has a normal mood and affect. Her behavior is normal.    BP 110/60 mmHg  Pulse 66  Temp(Src) 98.5 F (36.9 C) (Oral)  Ht 5' 2.25" (1.581 m)  Wt 144 lb (65.318 kg)  BMI 26.13 kg/m2  SpO2 97% Wt Readings from Last 3 Encounters:  01/11/15 144 lb (65.318 kg)  10/09/14 147 lb (66.679 kg)  04/06/14 148 lb 8 oz (67.359 kg)     Lab Results  Component Value Date   WBC 4.4 03/30/2014   HGB 13.6 03/30/2014   HCT 41.2 03/30/2014   PLT 206.0 03/30/2014   GLUCOSE 82 10/08/2014   CHOL 190 10/08/2014   TRIG 44.0 10/08/2014  HDL 86.50 10/08/2014   LDLDIRECT 105.8 09/06/2013   LDLCALC 95 10/08/2014   ALT 11 10/08/2014   AST 21 10/08/2014   NA 140 10/08/2014   K 4.1 10/08/2014   CL 105 10/08/2014   CREATININE 0.78 10/08/2014   BUN 19 10/08/2014   CO2 29 10/08/2014   TSH 1.40 03/30/2014       Assessment & Plan:   Problem List Items Addressed This Visit    Anxiety    Doing well.  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  Follow.  Continue prozac.        Atrial fibrillation - Primary    Rate controlled.  Sees Dr Nehemiah Massed.  Follow.        Relevant Orders   CBC with Differential/Platelet   TSH   Constipation     Describes constipation as outlined.  Still goes daily.  Add fiber - benefiber daily as directed.  Follow.       Health care maintenance    Physical 04/06/14.  Mammogram 09/26/14 - Birads I.       Hypercholesterolemia    Low cholesterol diet and exercise.  On lovastatin.  Follow lipid panel and liver function tests.        Relevant Orders   Lipid panel   Hepatic function panel   Hypertension    Blood pressure doing well.  Same medication regimen.  Follow pressures.  Follow metabolic panel.       Relevant Orders   Basic metabolic panel   Osteoarthritis    Has seen Dr Jefm Bryant.  Stable.           Einar Pheasant, MD

## 2015-01-13 ENCOUNTER — Encounter: Payer: Self-pay | Admitting: Internal Medicine

## 2015-01-13 DIAGNOSIS — K5900 Constipation, unspecified: Secondary | ICD-10-CM | POA: Insufficient documentation

## 2015-01-13 HISTORY — DX: Constipation, unspecified: K59.00

## 2015-01-13 NOTE — Assessment & Plan Note (Signed)
Has seen Dr Jefm Bryant.  Stable.

## 2015-01-13 NOTE — Assessment & Plan Note (Signed)
Physical 04/06/14.  Mammogram 09/26/14 - Birads I.

## 2015-01-13 NOTE — Assessment & Plan Note (Signed)
Blood pressure doing well.  Same medication regimen.  Follow pressures.  Follow metabolic panel.   

## 2015-01-13 NOTE — Assessment & Plan Note (Signed)
Rate controlled.  Sees Dr Nehemiah Massed.  Follow.

## 2015-01-13 NOTE — Assessment & Plan Note (Signed)
Describes constipation as outlined.  Still goes daily.  Add fiber - benefiber daily as directed.  Follow.

## 2015-01-13 NOTE — Assessment & Plan Note (Signed)
Low cholesterol diet and exercise.  On lovastatin.  Follow lipid panel and liver function tests.   

## 2015-01-13 NOTE — Assessment & Plan Note (Signed)
Doing well.  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  Follow.  Continue prozac.

## 2015-02-01 ENCOUNTER — Other Ambulatory Visit: Payer: Self-pay | Admitting: Internal Medicine

## 2015-02-07 ENCOUNTER — Other Ambulatory Visit: Payer: Self-pay | Admitting: *Deleted

## 2015-02-07 MED ORDER — AMLODIPINE BESYLATE 10 MG PO TABS: 10.0000 mg | ORAL_TABLET | Freq: Every day | ORAL | Status: DC

## 2015-03-05 ENCOUNTER — Other Ambulatory Visit: Payer: Self-pay | Admitting: Internal Medicine

## 2015-03-13 DIAGNOSIS — I10 Essential (primary) hypertension: Secondary | ICD-10-CM | POA: Insufficient documentation

## 2015-03-13 HISTORY — DX: Essential (primary) hypertension: I10

## 2015-04-04 ENCOUNTER — Other Ambulatory Visit: Payer: Self-pay | Admitting: Internal Medicine

## 2015-04-12 ENCOUNTER — Other Ambulatory Visit (INDEPENDENT_AMBULATORY_CARE_PROVIDER_SITE_OTHER): Payer: Medicare Other

## 2015-04-12 DIAGNOSIS — E7800 Pure hypercholesterolemia, unspecified: Secondary | ICD-10-CM

## 2015-04-12 DIAGNOSIS — I4891 Unspecified atrial fibrillation: Secondary | ICD-10-CM

## 2015-04-12 DIAGNOSIS — I10 Essential (primary) hypertension: Secondary | ICD-10-CM

## 2015-04-12 LAB — LIPID PANEL
CHOL/HDL RATIO: 2
Cholesterol: 176 mg/dL (ref 0–200)
HDL: 75.2 mg/dL (ref >39.00)
LDL CALC: 92 mg/dL (ref 0–99)
NonHDL: 101.03
TRIGLYCERIDES: 45 mg/dL (ref 0.0–149.0)
VLDL: 9 mg/dL (ref 0.0–40.0)

## 2015-04-12 LAB — CBC WITH DIFFERENTIAL/PLATELET
Basophils Absolute: 0 10*3/uL (ref 0.0–0.1)
Basophils Relative: 0.7 % (ref 0.0–3.0)
Eosinophils Absolute: 0.1 10*3/uL (ref 0.0–0.7)
Eosinophils Relative: 2.3 % (ref 0.0–5.0)
HCT: 41.1 % (ref 36.0–46.0)
Hemoglobin: 13.8 g/dL (ref 12.0–15.0)
LYMPHS PCT: 16 % (ref 12.0–46.0)
Lymphs Abs: 0.9 10*3/uL (ref 0.7–4.0)
MCHC: 33.5 g/dL (ref 30.0–36.0)
MCV: 91.1 fl (ref 78.0–100.0)
MONOS PCT: 8.9 % (ref 3.0–12.0)
Monocytes Absolute: 0.5 10*3/uL (ref 0.1–1.0)
NEUTROS ABS: 4 10*3/uL (ref 1.4–7.7)
Neutrophils Relative %: 72.1 % (ref 43.0–77.0)
PLATELETS: 226 10*3/uL (ref 150.0–400.0)
RBC: 4.51 Mil/uL (ref 3.87–5.11)
RDW: 13.7 % (ref 11.5–15.5)
WBC: 5.5 10*3/uL (ref 4.0–10.5)

## 2015-04-12 LAB — BASIC METABOLIC PANEL
BUN: 23 mg/dL (ref 6–23)
CHLORIDE: 105 meq/L (ref 96–112)
CO2: 32 mEq/L (ref 19–32)
Calcium: 9.6 mg/dL (ref 8.4–10.5)
Creatinine, Ser: 0.85 mg/dL (ref 0.40–1.20)
GFR: 67.59 mL/min (ref >60.00)
Glucose, Bld: 93 mg/dL (ref 70–99)
POTASSIUM: 4.3 meq/L (ref 3.5–5.1)
SODIUM: 142 meq/L (ref 135–145)

## 2015-04-12 LAB — HEPATIC FUNCTION PANEL
ALT: 10 U/L (ref 0–35)
AST: 17 U/L (ref 0–37)
Albumin: 4.1 g/dL (ref 3.5–5.2)
Alkaline Phosphatase: 72 U/L (ref 39–117)
BILIRUBIN DIRECT: 0.1 mg/dL (ref 0.0–0.3)
BILIRUBIN TOTAL: 0.4 mg/dL (ref 0.2–1.2)
TOTAL PROTEIN: 6.7 g/dL (ref 6.0–8.3)

## 2015-04-12 LAB — TSH: TSH: 1.38 u[IU]/mL (ref 0.35–4.50)

## 2015-04-16 ENCOUNTER — Encounter: Payer: Self-pay | Admitting: *Deleted

## 2015-04-19 ENCOUNTER — Encounter: Payer: Medicare Other | Admitting: Internal Medicine

## 2015-05-09 ENCOUNTER — Other Ambulatory Visit: Payer: Self-pay | Admitting: Internal Medicine

## 2015-05-09 NOTE — Telephone Encounter (Signed)
ok'd refill for trazodone #30 with one refill.

## 2015-05-09 NOTE — Telephone Encounter (Signed)
Please advise 

## 2015-05-21 ENCOUNTER — Encounter: Payer: Self-pay | Admitting: Internal Medicine

## 2015-05-21 ENCOUNTER — Ambulatory Visit (INDEPENDENT_AMBULATORY_CARE_PROVIDER_SITE_OTHER): Payer: Medicare Other | Admitting: Internal Medicine

## 2015-05-21 VITALS — BP 120/70 | HR 58 | Temp 97.8°F | Resp 18 | Ht 62.25 in | Wt 146.5 lb

## 2015-05-21 DIAGNOSIS — Z23 Encounter for immunization: Secondary | ICD-10-CM | POA: Diagnosis not present

## 2015-05-21 DIAGNOSIS — M1990 Unspecified osteoarthritis, unspecified site: Secondary | ICD-10-CM

## 2015-05-21 DIAGNOSIS — I10 Essential (primary) hypertension: Secondary | ICD-10-CM

## 2015-05-21 DIAGNOSIS — H9191 Unspecified hearing loss, right ear: Secondary | ICD-10-CM

## 2015-05-21 DIAGNOSIS — I4891 Unspecified atrial fibrillation: Secondary | ICD-10-CM

## 2015-05-21 DIAGNOSIS — H9190 Unspecified hearing loss, unspecified ear: Secondary | ICD-10-CM

## 2015-05-21 DIAGNOSIS — E7800 Pure hypercholesterolemia, unspecified: Secondary | ICD-10-CM

## 2015-05-21 DIAGNOSIS — F419 Anxiety disorder, unspecified: Secondary | ICD-10-CM

## 2015-05-21 DIAGNOSIS — Z0000 Encounter for general adult medical examination without abnormal findings: Secondary | ICD-10-CM

## 2015-05-21 HISTORY — DX: Unspecified hearing loss, unspecified ear: H91.90

## 2015-05-21 NOTE — Progress Notes (Signed)
Pre-visit discussion using our clinic review tool. No additional management support is needed unless otherwise documented below in the visit note.  

## 2015-05-21 NOTE — Progress Notes (Signed)
Patient ID: Kristin Avila, female   DOB: [birth year], 81 y.o.   MRN: [id number]   Subjective:    Patient ID: Kristin Avila, female    DOB: 12-Jul-1928, 81 y.o.   MRN: [id number]  HPI  Patient with past history of afib, hypertension, anxiety and hypercholesterolemia who comes in today to f/u on these issues as well as for a complete physical exam.  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  Stays active.  No cardiac symptoms with increased activity or exertion.  No sob.  No acid reflux.  No abdominal pain or cramping.  No bowel change.  Hearing has worsened recently.  Has had abx.  No improvement.  Discussed ENT evaluation.     Past Medical History  Diagnosis Date  . Hypertension   . Hypercholesterolemia   . Anemia   . Osteoarthritis     hands, feet  . Atrial fibrillation ([place]) 11/13    new onset 11/13   Past Surgical History  Procedure Laterality Date  . Tubal ligation    . Appendectomy  1957  . Kidney surgery  age 84  . Cataract extraction  2013    left eye   Family History  Problem Relation Age of Onset  . Congestive Heart Failure Father   . Multiple myeloma Mother   . Hypertension Brother   . Alzheimer's disease      grandmother and great aunts  . Breast cancer Neg Hx   . Colon cancer Neg Hx    Social History   Social History  . Marital Status: Widowed    Spouse Name: N/A  . Number of Children: 4  . Years of Education: N/A   Social History Main Topics  . Smoking status: Never Smoker   . Smokeless tobacco: Never Used  . Alcohol Use: No     Comment: occasional  . Drug Use: No  . Sexual Activity: Not Asked   Other Topics Concern  . None   Social History Narrative    Outpatient Encounter Prescriptions as of 05/21/2015  Medication Sig  . amLODipine (NORVASC) 10 MG tablet Take 1 tablet (10 mg total) by mouth daily.  Marland Kitchen aspirin 81 MG tablet Take 81 mg by mouth daily.  . Cholecalciferol (VITAMIN D-3) 1000 UNITS CAPS Take by mouth.  Marland Kitchen FLUoxetine  (PROZAC) 40 MG capsule TAKE ONE CAPSULE BY MOUTH ONCE DAILY  . lisinopril (PRINIVIL,ZESTRIL) 40 MG tablet TAKE ONE TABLET BY MOUTH ONCE DAILY  . lovastatin (MEVACOR) 20 MG tablet TAKE ONE TABLET BY MOUTH ONCE DAILY  . naproxen sodium (ANAPROX) 220 MG tablet Take 220 mg by mouth 2 (two) times daily with a meal.  . traZODone (DESYREL) 100 MG tablet TAKE ONE TABLET BY MOUTH ONCE DAILY AT BEDTIME.   No facility-administered encounter medications on file as of 05/21/2015.    Review of Systems  Constitutional: Negative for appetite change and unexpected weight change.  HENT: Negative for congestion and sinus pressure.   Eyes: Negative for pain and visual disturbance.  Respiratory: Negative for cough, chest tightness and shortness of breath.   Cardiovascular: Negative for chest pain, palpitations and leg swelling.  Gastrointestinal: Negative for nausea, vomiting, abdominal pain and diarrhea.  Genitourinary: Negative for dysuria and difficulty urinating.  Musculoskeletal: Negative for back pain and joint swelling.  Skin: Negative for color change and rash.  Neurological: Negative for dizziness, light-headedness and headaches.  Hematological: Negative for adenopathy. Does not bruise/bleed easily.  Psychiatric/Behavioral: Negative for dysphoric mood  and agitation.       Objective:    Physical Exam  Constitutional: She is oriented to person, place, and time. She appears well-developed and well-nourished. No distress.  HENT:  Nose: Nose normal.  Mouth/Throat: Oropharynx is clear and moist.  Eyes: Right eye exhibits no discharge. Left eye exhibits no discharge. No scleral icterus.  Neck: Neck supple. No thyromegaly present.  Cardiovascular: Normal rate.   Rate controlled.   Pulmonary/Chest: Breath sounds normal. No accessory muscle usage. No tachypnea. No respiratory distress. She has no decreased breath sounds. She has no wheezes. She has no rhonchi. Right breast exhibits no inverted nipple,  no mass, no nipple discharge and no tenderness (no axillary adenopathy). Left breast exhibits no inverted nipple, no mass, no nipple discharge and no tenderness (no axilarry adenopathy).  Abdominal: Soft. Bowel sounds are normal. There is no tenderness.  Musculoskeletal: She exhibits no edema or tenderness.  Lymphadenopathy:    She has no cervical adenopathy.  Neurological: She is alert and oriented to person, place, and time.  Skin: Skin is warm. No rash noted. No erythema.  Psychiatric: She has a normal mood and affect. Her behavior is normal.    BP 120/70 mmHg  Pulse 58  Temp(Src) 97.8 F (36.6 C) (Oral)  Resp 18  Ht 5' 2.25" (1.581 m)  Wt 146 lb 8 oz (66.452 kg)  BMI 26.59 kg/m2  SpO2 97% Wt Readings from Last 3 Encounters:  05/21/15 146 lb 8 oz (66.452 kg)  01/11/15 144 lb (65.318 kg)  10/09/14 147 lb (66.679 kg)     Lab Results  Component Value Date   WBC 5.5 04/12/2015   HGB 13.8 04/12/2015   HCT 41.1 04/12/2015   PLT 226.0 04/12/2015   GLUCOSE 93 04/12/2015   CHOL 176 04/12/2015   TRIG 45.0 04/12/2015   HDL 75.20 04/12/2015   LDLDIRECT 105.8 09/06/2013   LDLCALC 92 04/12/2015   ALT 10 04/12/2015   AST 17 04/12/2015   NA 142 04/12/2015   K 4.3 04/12/2015   CL 105 04/12/2015   CREATININE 0.85 04/12/2015   BUN 23 04/12/2015   CO2 32 04/12/2015   TSH 1.38 04/12/2015       Assessment & Plan:   Problem List Items Addressed This Visit    Anxiety    On prozac.  Feels she is doing well.  Follow.        Atrial fibrillation ([place])    Followed by Dr Nehemiah Massed.  Aymptomatic.  Follow.        Decreased hearing - Primary    Has a history of decreased hearing, but has noticed worsening lately.  Did not respond to abx.  Refer to ENT for evaluation.        Relevant Orders   Ambulatory referral to ENT   Health care maintenance    Physical today 05/21/15.  Mammogram 09/26/14 - Birads I.  Colonoscopy 09/06/08 poor prep.  Recommended BE.  She has declined further  colon evaluation.       Hypercholesterolemia    On lovastatin.  Low cholesterol diet and exercise.  Follow lipid panel and liver function tests.        Relevant Orders   Lipid panel   Hepatic function panel   Hypertension    Blood pressure under good control.  Continue same medication regimen.  Follow pressures.  Follow metabolic panel.        Relevant Orders   Basic metabolic panel   Osteoarthritis  Has seen Dr Jefm Bryant.  Stays active.  Stable.            Einar Pheasant, MD

## 2015-05-27 ENCOUNTER — Encounter: Payer: Self-pay | Admitting: Internal Medicine

## 2015-05-27 NOTE — Assessment & Plan Note (Signed)
On prozac.  Feels she is doing well.  Follow.   

## 2015-05-27 NOTE — Assessment & Plan Note (Signed)
Has seen Dr Jefm Bryant.  Stays active.  Stable.

## 2015-05-27 NOTE — Assessment & Plan Note (Signed)
Followed by Dr Nehemiah Massed.  Aymptomatic.  Follow.

## 2015-05-27 NOTE — Assessment & Plan Note (Signed)
Has a history of decreased hearing, but has noticed worsening lately.  Did not respond to abx.  Refer to ENT for evaluation.

## 2015-05-27 NOTE — Assessment & Plan Note (Addendum)
Physical today 05/21/15.  Mammogram 09/26/14 - Birads I.  Colonoscopy 09/06/08 poor prep.  Recommended BE.  She has declined further colon evaluation.

## 2015-05-27 NOTE — Assessment & Plan Note (Signed)
On lovastatin.  Low cholesterol diet and exercise.  Follow lipid panel and liver function tests.   

## 2015-05-27 NOTE — Assessment & Plan Note (Signed)
Blood pressure under good control.  Continue same medication regimen.  Follow pressures.  Follow metabolic panel.   

## 2015-07-08 ENCOUNTER — Other Ambulatory Visit: Payer: Self-pay | Admitting: Internal Medicine

## 2015-07-14 ENCOUNTER — Other Ambulatory Visit: Payer: Self-pay | Admitting: Internal Medicine

## 2015-08-08 ENCOUNTER — Other Ambulatory Visit: Payer: Self-pay | Admitting: Internal Medicine

## 2015-09-10 ENCOUNTER — Other Ambulatory Visit: Payer: Self-pay | Admitting: Internal Medicine

## 2015-09-24 ENCOUNTER — Other Ambulatory Visit (INDEPENDENT_AMBULATORY_CARE_PROVIDER_SITE_OTHER): Payer: Medicare Other

## 2015-09-24 DIAGNOSIS — I10 Essential (primary) hypertension: Secondary | ICD-10-CM

## 2015-09-24 DIAGNOSIS — E7800 Pure hypercholesterolemia, unspecified: Secondary | ICD-10-CM | POA: Diagnosis not present

## 2015-09-24 LAB — BASIC METABOLIC PANEL
BUN: 15 mg/dL (ref 6–23)
CHLORIDE: 106 meq/L (ref 96–112)
CO2: 27 meq/L (ref 19–32)
CREATININE: 0.78 mg/dL (ref 0.40–1.20)
Calcium: 9.9 mg/dL (ref 8.4–10.5)
GFR: 74.56 mL/min (ref >60.00)
GLUCOSE: 95 mg/dL (ref 70–99)
POTASSIUM: 4.2 meq/L (ref 3.5–5.1)
Sodium: 141 mEq/L (ref 135–145)

## 2015-09-24 LAB — HEPATIC FUNCTION PANEL
ALK PHOS: 71 U/L (ref 39–117)
ALT: 9 U/L (ref 0–35)
AST: 20 U/L (ref 0–37)
Albumin: 4.2 g/dL (ref 3.5–5.2)
BILIRUBIN DIRECT: 0.1 mg/dL (ref 0.0–0.3)
BILIRUBIN TOTAL: 0.5 mg/dL (ref 0.2–1.2)
TOTAL PROTEIN: 6.6 g/dL (ref 6.0–8.3)

## 2015-09-24 LAB — LIPID PANEL
CHOL/HDL RATIO: 3
CHOLESTEROL: 202 mg/dL — AB (ref 0–200)
HDL: 77.6 mg/dL (ref >39.00)
LDL Cholesterol: 112 mg/dL — ABNORMAL HIGH (ref 0–99)
NONHDL: 124.63
TRIGLYCERIDES: 61 mg/dL (ref 0.0–149.0)
VLDL: 12.2 mg/dL (ref 0.0–40.0)

## 2015-09-26 ENCOUNTER — Ambulatory Visit (INDEPENDENT_AMBULATORY_CARE_PROVIDER_SITE_OTHER): Payer: Medicare Other | Admitting: Internal Medicine

## 2015-09-26 ENCOUNTER — Encounter: Payer: Self-pay | Admitting: Internal Medicine

## 2015-09-26 VITALS — BP 128/70 | HR 71 | Temp 97.7°F | Resp 14 | Ht 62.0 in | Wt 145.2 lb

## 2015-09-26 DIAGNOSIS — E7800 Pure hypercholesterolemia, unspecified: Secondary | ICD-10-CM

## 2015-09-26 DIAGNOSIS — F419 Anxiety disorder, unspecified: Secondary | ICD-10-CM

## 2015-09-26 DIAGNOSIS — I10 Essential (primary) hypertension: Secondary | ICD-10-CM | POA: Diagnosis not present

## 2015-09-26 DIAGNOSIS — Z1239 Encounter for other screening for malignant neoplasm of breast: Secondary | ICD-10-CM

## 2015-09-26 DIAGNOSIS — I4891 Unspecified atrial fibrillation: Secondary | ICD-10-CM

## 2015-09-26 DIAGNOSIS — H9191 Unspecified hearing loss, right ear: Secondary | ICD-10-CM

## 2015-09-26 NOTE — Progress Notes (Signed)
Patient ID: Kristin Avila, female   DOB: 07/06/1928, 82 y.o.   MRN: [id number]   Subjective:    Patient ID: Kristin Avila, female    DOB: Dec 15, 1927, 82 y.o.   MRN: [id number]  HPI  Patient with past history of hypercholesterolemia, afib and hypertension.  She comes in today to follow up on these issues.  She is doing well.  Stays active.  No cardiac symptoms with increased activity or exertion.  No sob.  No acid reflux.  No abdominal pain or cramping.  Bowels stable.     Past Medical History  Diagnosis Date  . Hypertension   . Hypercholesterolemia   . Anemia   . Osteoarthritis     hands, feet  . Atrial fibrillation ([place]) 11/13    new onset 11/13   Past Surgical History  Procedure Laterality Date  . Tubal ligation    . Appendectomy  1957  . Kidney surgery  age 9  . Cataract extraction  2013    left eye   Family History  Problem Relation Age of Onset  . Congestive Heart Failure Father   . Multiple myeloma Mother   . Hypertension Brother   . Alzheimer's disease      grandmother and great aunts  . Breast cancer Neg Hx   . Colon cancer Neg Hx    Social History   Social History  . Marital Status: Widowed    Spouse Name: N/A  . Number of Children: 4  . Years of Education: N/A   Social History Main Topics  . Smoking status: Never Smoker   . Smokeless tobacco: Never Used  . Alcohol Use: No     Comment: occasional  . Drug Use: No  . Sexual Activity: Not Asked   Other Topics Concern  . None   Social History Narrative    Outpatient Encounter Prescriptions as of 09/26/2015  Medication Sig  . amLODipine (NORVASC) 10 MG tablet Take 1 tablet (10 mg total) by mouth daily.  Marland Kitchen aspirin 81 MG tablet Take 81 mg by mouth daily.  . Cholecalciferol (VITAMIN D-3) 1000 UNITS CAPS Take by mouth.  Marland Kitchen FLUoxetine (PROZAC) 40 MG capsule TAKE ONE CAPSULE BY MOUTH ONCE DAILY  . lisinopril (PRINIVIL,ZESTRIL) 40 MG tablet TAKE ONE TABLET BY MOUTH ONCE DAILY  . lovastatin  (MEVACOR) 20 MG tablet TAKE ONE TABLET BY MOUTH ONCE DAILY  . naproxen sodium (ANAPROX) 220 MG tablet Take 220 mg by mouth 2 (two) times daily with a meal.  . traZODone (DESYREL) 100 MG tablet TAKE ONE TABLET BY MOUTH AT BEDTIME  . [DISCONTINUED] lisinopril (PRINIVIL,ZESTRIL) 40 MG tablet TAKE ONE TABLET BY MOUTH ONCE DAILY  . [DISCONTINUED] amLODipine (NORVASC) 10 MG tablet TAKE ONE TABLET BY MOUTH ONCE DAILY   No facility-administered encounter medications on file as of 09/26/2015.    Review of Systems  Constitutional: Negative for appetite change and unexpected weight change.  HENT: Negative for congestion and sinus pressure.   Respiratory: Negative for cough, chest tightness and shortness of breath.   Cardiovascular: Negative for chest pain, palpitations and leg swelling.  Gastrointestinal: Negative for nausea, vomiting, abdominal pain and diarrhea.  Genitourinary: Negative for dysuria and difficulty urinating.  Musculoskeletal: Negative for back pain and joint swelling.  Skin: Negative for color change and rash.  Neurological: Negative for dizziness, light-headedness and headaches.  Psychiatric/Behavioral: Negative for dysphoric mood and agitation.       Objective:    Physical Exam  Constitutional: She appears well-developed  and well-nourished. No distress.  HENT:  Nose: Nose normal.  Mouth/Throat: Oropharynx is clear and moist.  Eyes: Right eye exhibits no discharge. Left eye exhibits no discharge.  Neck: Neck supple. No thyromegaly present.  Cardiovascular: Normal rate and regular rhythm.   Pulmonary/Chest: Breath sounds normal. No respiratory distress. She has no wheezes.  Abdominal: Soft. Bowel sounds are normal. There is no tenderness.  Musculoskeletal: She exhibits no edema or tenderness.  Lymphadenopathy:    She has no cervical adenopathy.  Skin: No rash noted. No erythema.  Psychiatric: She has a normal mood and affect. Her behavior is normal.    BP 128/70 mmHg   Pulse 71  Temp(Src) 97.7 F (36.5 C) (Oral)  Resp 14  Ht 5' 2"  (1.575 m)  Wt 145 lb 3.2 oz (65.862 kg)  BMI 26.55 kg/m2  SpO2 98% Wt Readings from Last 3 Encounters:  09/26/15 145 lb 3.2 oz (65.862 kg)  05/21/15 146 lb 8 oz (66.452 kg)  01/11/15 144 lb (65.318 kg)     Lab Results  Component Value Date   WBC 5.5 04/12/2015   HGB 13.8 04/12/2015   HCT 41.1 04/12/2015   PLT 226.0 04/12/2015   GLUCOSE 95 09/24/2015   CHOL 202* 09/24/2015   TRIG 61.0 09/24/2015   HDL 77.60 09/24/2015   LDLDIRECT 105.8 09/06/2013   LDLCALC 112* 09/24/2015   ALT 9 09/24/2015   AST 20 09/24/2015   NA 141 09/24/2015   K 4.2 09/24/2015   CL 106 09/24/2015   CREATININE 0.78 09/24/2015   BUN 15 09/24/2015   CO2 27 09/24/2015   TSH 1.38 04/12/2015       Assessment & Plan:   Problem List Items Addressed This Visit    Anxiety    On prozac and doing well.  Follow.        Atrial fibrillation ([place])    Followed by Dr Nehemiah Massed.  Asymptomatic.  Follow.        Decreased hearing    Has hearing aids.  Follow.        Hypercholesterolemia    Low cholesterol diet and exercise.  Follow lipid panel and liver function tests.  On lovastatin.        Relevant Orders   Lipid panel   Hepatic function panel   Hypertension    Blood pressure under good control.  Continue same medication regimen.  Follow pressures.  Follow metabolic panel.        Relevant Orders   Basic metabolic panel    Other Visit Diagnoses    Breast cancer screening    -  Primary    Relevant Orders    MM DIGITAL SCREENING BILATERAL        Einar Pheasant, MD

## 2015-09-26 NOTE — Progress Notes (Signed)
Pre visit review using our clinic review tool, if applicable. No additional management support is needed unless otherwise documented below in the visit note. 

## 2015-09-29 ENCOUNTER — Encounter: Payer: Self-pay | Admitting: Internal Medicine

## 2015-09-29 NOTE — Assessment & Plan Note (Signed)
On prozac and doing well.  Follow.   

## 2015-09-29 NOTE — Assessment & Plan Note (Signed)
Blood pressure under good control.  Continue same medication regimen.  Follow pressures.  Follow metabolic panel.   

## 2015-09-29 NOTE — Assessment & Plan Note (Signed)
Followed by Dr Nehemiah Massed.  Asymptomatic.  Follow.

## 2015-09-29 NOTE — Assessment & Plan Note (Signed)
Has hearing aids.  Follow.

## 2015-09-29 NOTE — Assessment & Plan Note (Signed)
Low cholesterol diet and exercise.  Follow lipid panel and liver function tests.  On lovastatin.   

## 2015-09-30 ENCOUNTER — Ambulatory Visit
Admission: RE | Admit: 2015-09-30 | Discharge: 2015-09-30 | Disposition: A | Discharge status: Home / Self Care | Payer: Medicare Other | Source: Ambulatory Visit | Attending: Internal Medicine | Admitting: Internal Medicine

## 2015-09-30 DIAGNOSIS — Z1239 Encounter for other screening for malignant neoplasm of breast: Secondary | ICD-10-CM

## 2015-10-01 ENCOUNTER — Other Ambulatory Visit: Payer: Self-pay | Admitting: Internal Medicine

## 2015-10-01 ENCOUNTER — Ambulatory Visit
Admission: RE | Admit: 2015-10-01 | Discharge: 2015-10-01 | Disposition: A | Discharge status: Home / Self Care | Payer: Medicare Other | Source: Ambulatory Visit | Attending: Internal Medicine | Admitting: Internal Medicine

## 2015-10-01 DIAGNOSIS — Z1239 Encounter for other screening for malignant neoplasm of breast: Secondary | ICD-10-CM

## 2015-10-01 DIAGNOSIS — Z1231 Encounter for screening mammogram for malignant neoplasm of breast: Secondary | ICD-10-CM | POA: Diagnosis present

## 2015-10-03 ENCOUNTER — Ambulatory Visit: Admission: RE | Admit: 2015-10-03 | Payer: Medicare Other | Source: Ambulatory Visit

## 2015-10-08 ENCOUNTER — Other Ambulatory Visit: Payer: Self-pay | Admitting: Internal Medicine

## 2015-10-08 NOTE — Telephone Encounter (Signed)
Okay to refill? Last seen on 09/26/15

## 2015-10-09 NOTE — Telephone Encounter (Signed)
ok'd refill prozac #30 with 3 refills and trazodone #30 with 1 refill.

## 2015-12-13 ENCOUNTER — Other Ambulatory Visit: Payer: Self-pay | Admitting: Internal Medicine

## 2016-01-06 ENCOUNTER — Other Ambulatory Visit: Payer: Self-pay | Admitting: Internal Medicine

## 2016-02-14 ENCOUNTER — Other Ambulatory Visit: Payer: Self-pay | Admitting: Internal Medicine

## 2016-02-16 ENCOUNTER — Other Ambulatory Visit: Payer: Self-pay | Admitting: Internal Medicine

## 2016-02-17 NOTE — Telephone Encounter (Signed)
Please advise refills, thanks 

## 2016-02-17 NOTE — Telephone Encounter (Signed)
ok'd refill for trazodone and prozac #30 with 2 refills.

## 2016-02-24 ENCOUNTER — Telehealth: Payer: Self-pay

## 2016-02-24 NOTE — Telephone Encounter (Signed)
AWV appointment scheduled 04/03/16 after follow up with PCP.

## 2016-02-24 NOTE — Telephone Encounter (Signed)
Left a message for patient to call me back regarding appointment.

## 2016-02-24 NOTE — Telephone Encounter (Signed)
Patient is on the list for [facility] 2017 and may be a good candidate for an AWV in 2017. Please let me know if/when appt is scheduled.   

## 2016-03-16 ENCOUNTER — Telehealth: Payer: Self-pay | Admitting: Internal Medicine

## 2016-03-16 NOTE — Telephone Encounter (Signed)
Pt called stating that she needs a refill of her Trazadon sent to [facility] on [address]. She states that her bottle said appt only or call office. She is scheduled to see Dr. Nicki Reaper on 8/25. Please call pt once submitted.

## 2016-03-17 MED ORDER — TRAZODONE HCL 100 MG PO TABS: 100.0000 mg | ORAL_TABLET | Freq: Every day | ORAL | 2 refills | Status: DC

## 2016-03-17 NOTE — Telephone Encounter (Signed)
ok'd rx for trazodone #30 with 2 refills.   

## 2016-03-17 NOTE — Telephone Encounter (Signed)
I have sent in rx for trazodone.

## 2016-03-24 ENCOUNTER — Telehealth: Payer: Self-pay | Admitting: Internal Medicine

## 2016-03-24 MED ORDER — FLUOXETINE HCL 40 MG PO CAPS: 40.0000 mg | ORAL_CAPSULE | Freq: Every day | ORAL | 2 refills | Status: DC

## 2016-03-24 NOTE — Telephone Encounter (Signed)
Pt lvm on my ext regarding a refill of her fluoxetine 40 mg. Please cb once submitted to [facility].

## 2016-03-24 NOTE — Telephone Encounter (Signed)
Refilled RX, notified patient.

## 2016-04-03 ENCOUNTER — Ambulatory Visit: Payer: Medicare Other | Admitting: Internal Medicine

## 2016-04-17 ENCOUNTER — Other Ambulatory Visit: Payer: Self-pay | Admitting: Internal Medicine

## 2016-04-24 DIAGNOSIS — C44310 Basal cell carcinoma of skin of unspecified parts of face: Secondary | ICD-10-CM

## 2016-04-24 HISTORY — DX: Basal cell carcinoma of skin of unspecified parts of face: C44.310

## 2016-05-08 ENCOUNTER — Encounter (INDEPENDENT_AMBULATORY_CARE_PROVIDER_SITE_OTHER): Payer: Self-pay

## 2016-05-08 ENCOUNTER — Ambulatory Visit (INDEPENDENT_AMBULATORY_CARE_PROVIDER_SITE_OTHER): Payer: Medicare Other | Admitting: Internal Medicine

## 2016-05-08 ENCOUNTER — Encounter: Payer: Self-pay | Admitting: Internal Medicine

## 2016-05-08 DIAGNOSIS — F419 Anxiety disorder, unspecified: Secondary | ICD-10-CM

## 2016-05-08 DIAGNOSIS — Z23 Encounter for immunization: Secondary | ICD-10-CM | POA: Diagnosis not present

## 2016-05-08 DIAGNOSIS — I4891 Unspecified atrial fibrillation: Secondary | ICD-10-CM

## 2016-05-08 DIAGNOSIS — M1990 Unspecified osteoarthritis, unspecified site: Secondary | ICD-10-CM | POA: Diagnosis not present

## 2016-05-08 DIAGNOSIS — H9191 Unspecified hearing loss, right ear: Secondary | ICD-10-CM

## 2016-05-08 DIAGNOSIS — E7800 Pure hypercholesterolemia, unspecified: Secondary | ICD-10-CM

## 2016-05-08 DIAGNOSIS — I10 Essential (primary) hypertension: Secondary | ICD-10-CM | POA: Diagnosis not present

## 2016-05-08 LAB — HEPATIC FUNCTION PANEL
ALBUMIN: 4 g/dL (ref 3.5–5.2)
ALK PHOS: 71 U/L (ref 39–117)
ALT: 11 U/L (ref 0–35)
AST: 21 U/L (ref 0–37)
Bilirubin, Direct: 0 mg/dL (ref 0.0–0.3)
Total Bilirubin: 0.3 mg/dL (ref 0.2–1.2)
Total Protein: 6.9 g/dL (ref 6.0–8.3)

## 2016-05-08 LAB — BASIC METABOLIC PANEL
BUN: 22 mg/dL (ref 6–23)
CALCIUM: 9.4 mg/dL (ref 8.4–10.5)
CO2: 32 meq/L (ref 19–32)
CREATININE: 0.78 mg/dL (ref 0.40–1.20)
Chloride: 104 mEq/L (ref 96–112)
GFR: 74.45 mL/min (ref >60.00)
Glucose, Bld: 83 mg/dL (ref 70–99)
Potassium: 4 mEq/L (ref 3.5–5.1)
SODIUM: 141 meq/L (ref 135–145)

## 2016-05-08 LAB — LIPID PANEL
CHOL/HDL RATIO: 2
Cholesterol: 198 mg/dL (ref 0–200)
HDL: 80.4 mg/dL (ref >39.00)
LDL CALC: 106 mg/dL — AB (ref 0–99)
NonHDL: 117.11
Triglycerides: 57 mg/dL (ref 0.0–149.0)
VLDL: 11.4 mg/dL (ref 0.0–40.0)

## 2016-05-08 NOTE — Progress Notes (Signed)
Pre visit review using our clinic review tool, if applicable. No additional management support is needed unless otherwise documented below in the visit note. 

## 2016-05-08 NOTE — Progress Notes (Signed)
Patient ID: Kristin Avila, female   DOB: 1928-07-29, 82 y.o.   MRN: [id number]   Subjective:    Patient ID: Kristin Avila, female    DOB: 19-Jul-1928, 82 y.o.   MRN: [id number]  HPI  Patient here for a scheduled follow up.  States she is doing well.  Just had surgery for basal cell carcinoma - nose.  Doing well.  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  She tries to stay active.  No chest pain.  No sob.  No acid reflux.  No abdominal pain or cramping. Bowels stable.  States her outside blood pressure checks are doing well.    Past Medical History:  Diagnosis Date  . Anemia   . Atrial fibrillation ([place]) 11/13   new onset 11/13  . Hypercholesterolemia   . Hypertension   . Osteoarthritis    hands, feet   Past Surgical History:  Procedure Laterality Date  . APPENDECTOMY  1957  . CATARACT EXTRACTION  2013   left eye  . KIDNEY SURGERY  age 20  . TUBAL LIGATION     Family History  Problem Relation Age of Onset  . Congestive Heart Failure Father   . Multiple myeloma Mother   . Hypertension Brother   . Alzheimer's disease      grandmother and great aunts  . Breast cancer Neg Hx   . Colon cancer Neg Hx    Social History   Social History  . Marital status: Widowed    Spouse name: N/A  . Number of children: 4  . Years of education: N/A   Social History Main Topics  . Smoking status: Never Smoker  . Smokeless tobacco: Never Used  . Alcohol use No     Comment: occasional  . Drug use: No  . Sexual activity: Not Asked   Other Topics Concern  . None   Social History Narrative  . None    Outpatient Encounter Prescriptions as of 05/08/2016  Medication Sig  . amLODipine (NORVASC) 10 MG tablet TAKE ONE TABLET BY MOUTH ONCE DAILY  . aspirin 81 MG tablet Take 81 mg by mouth daily.  . Cholecalciferol (VITAMIN D-3) 1000 UNITS CAPS Take by mouth.  Marland Kitchen FLUoxetine (PROZAC) 40 MG capsule Take 1 capsule (40 mg total) by mouth daily. NEEDS APPT FOR FURTHER REFILLS. CALL OFFICE SOON    . FLUoxetine (PROZAC) 40 MG capsule Take 1 capsule (40 mg total) by mouth daily.  Marland Kitchen lisinopril (PRINIVIL,ZESTRIL) 40 MG tablet TAKE ONE TABLET BY MOUTH ONCE DAILY  . lovastatin (MEVACOR) 20 MG tablet TAKE ONE TABLET BY MOUTH ONCE DAILY  . naproxen sodium (ANAPROX) 220 MG tablet Take 220 mg by mouth 2 (two) times daily with a meal.  . traZODone (DESYREL) 100 MG tablet Take 1 tablet (100 mg total) by mouth at bedtime.   No facility-administered encounter medications on file as of 05/08/2016.     Review of Systems  Constitutional: Negative for appetite change and unexpected weight change.  HENT: Negative for congestion and sinus pressure.   Respiratory: Negative for cough, chest tightness and shortness of breath.   Cardiovascular: Negative for chest pain, palpitations and leg swelling.  Gastrointestinal: Negative for abdominal pain, diarrhea, nausea and vomiting.  Musculoskeletal: Negative for joint swelling and myalgias.  Skin: Negative for color change and rash.  Neurological: Negative for dizziness, light-headedness and headaches.  Psychiatric/Behavioral: Negative for agitation and dysphoric mood.       Objective:     Blood pressure  rechecked by me:  144-146/78  Physical Exam  Constitutional: She appears well-developed and well-nourished. No distress.  HENT:  Nose: Nose normal.  Mouth/Throat: Oropharynx is clear and moist.  Neck: Neck supple. No thyromegaly present.  Cardiovascular: Normal rate and regular rhythm.   Pulmonary/Chest: Breath sounds normal. No respiratory distress. She has no wheezes.  Abdominal: Soft. Bowel sounds are normal. There is no tenderness.  Musculoskeletal: She exhibits no edema or tenderness.  Lymphadenopathy:    She has no cervical adenopathy.  Skin: No rash noted. No erythema.  Psychiatric: She has a normal mood and affect. Her behavior is normal.    BP (!) 150/70   Pulse 62   Temp 97.9 F (36.6 C) (Oral)   Ht 5' 2"  (1.575 m)   Wt 152 lb  (68.9 kg)   SpO2 96%   BMI 27.80 kg/m  Wt Readings from Last 3 Encounters:  05/08/16 152 lb (68.9 kg)  09/26/15 145 lb 3.2 oz (65.9 kg)  05/21/15 146 lb 8 oz (66.5 kg)     Lab Results  Component Value Date   WBC 5.5 04/12/2015   HGB 13.8 04/12/2015   HCT 41.1 04/12/2015   PLT 226.0 04/12/2015   GLUCOSE 83 05/08/2016   CHOL 198 05/08/2016   TRIG 57.0 05/08/2016   HDL 80.40 05/08/2016   LDLDIRECT 105.8 09/06/2013   LDLCALC 106 (H) 05/08/2016   ALT 11 05/08/2016   AST 21 05/08/2016   NA 141 05/08/2016   K 4.0 05/08/2016   CL 104 05/08/2016   CREATININE 0.78 05/08/2016   BUN 22 05/08/2016   CO2 32 05/08/2016   TSH 1.38 04/12/2015    Mm Screening Breast Tomo Bilateral  Result Date: 10/01/2015 CLINICAL DATA:  Screening. EXAM: DIGITAL SCREENING BILATERAL MAMMOGRAM WITH 3D TOMO WITH CAD COMPARISON:  Previous exam(s). ACR Breast Density Category b: There are scattered areas of fibroglandular density. FINDINGS: There are no findings suspicious for malignancy. Images were processed with CAD. IMPRESSION: No mammographic evidence of malignancy. A result letter of this screening mammogram will be mailed directly to the patient. RECOMMENDATION: Screening mammogram in one year. (Code:SM-B-01Y) BI-RADS CATEGORY  1: Negative. Electronically Signed   By: Marin Olp M.D.   On: 10/01/2015 16:26       Assessment & Plan:   Problem List Items Addressed This Visit    Anxiety    On prozac.  Stable.  Doing well.  Follow.       Atrial fibrillation ([facility])    Asymptomatic.  Followed by Dr Nehemiah Massed.  Stable.  Appears to be in sinus rhythm.        Decreased hearing    Has hearing aids.  Follow.       Hypercholesterolemia    Low cholesterol diet and exercise.  On lovastatin.  Follow lipid panel and liver function tests.        Hypertension    Blood pressure as outlined.  Has been doing well.  Continue same medication regimen.  Follow pressures.  Follow metabolic panel.        Osteoarthritis    Has seen Dr Jefm Bryant.  Stays active.  Stable.        Other Visit Diagnoses    Encounter for immunization       Relevant Orders   Flu vaccine HIGH DOSE PF (Completed)       Einar Pheasant, MD

## 2016-05-10 ENCOUNTER — Encounter: Payer: Self-pay | Admitting: Internal Medicine

## 2016-05-10 NOTE — Assessment & Plan Note (Signed)
Low cholesterol diet and exercise.  On lovastatin.  Follow lipid panel and liver function tests.   

## 2016-05-10 NOTE — Assessment & Plan Note (Signed)
Blood pressure as outlined.  Has been doing well.  Continue same medication regimen.  Follow pressures.  Follow metabolic panel.

## 2016-05-10 NOTE — Assessment & Plan Note (Signed)
Has hearing aids.  Follow.

## 2016-05-10 NOTE — Assessment & Plan Note (Signed)
On prozac.  Stable.  Doing well.  Follow.

## 2016-05-10 NOTE — Assessment & Plan Note (Signed)
Asymptomatic.  Followed by Dr Nehemiah Massed.  Stable.  Appears to be in sinus rhythm.

## 2016-05-10 NOTE — Assessment & Plan Note (Signed)
Has seen Dr Jefm Bryant.  Stays active.  Stable.

## 2016-08-07 ENCOUNTER — Telehealth: Payer: Self-pay | Admitting: Internal Medicine

## 2016-08-07 NOTE — Telephone Encounter (Signed)
Spoke with patients daughter Arbie Cookey who called in to get an appointment. Patient was seen at [facility] on Wednesday. They noticed a spot on her face that was puffy and red on Monday. By Tuesday it had gotten worse. They put her on antibiotic for infection. It has went down some. She has been on antibiotic for two days. I advised that if  symptoms get worse over the weekend to /be seen at a Walk in or urgent care. I scheduled her for 08/17/16 with Dr. Nicki Reaper for follow up. I told her that if she has any problems next week to call and we can try get get her in with another provider. Daughter was fine with this and stated that she would call back if needing a sooner appointment with another provider.

## 2016-08-07 NOTE — Telephone Encounter (Signed)
Pt daughter called wanting an appt with Dr. Nicki Reaper as a follow up from Urgent Care for early next week.Kristin Avila

## 2016-08-11 ENCOUNTER — Ambulatory Visit: Payer: Medicare Other

## 2016-08-17 ENCOUNTER — Encounter: Payer: Self-pay | Admitting: Internal Medicine

## 2016-08-17 ENCOUNTER — Ambulatory Visit (INDEPENDENT_AMBULATORY_CARE_PROVIDER_SITE_OTHER): Payer: Medicare Other | Admitting: Internal Medicine

## 2016-08-17 VITALS — BP 128/64 | HR 75 | Temp 98.2°F | Ht 62.0 in | Wt 160.4 lb

## 2016-08-17 DIAGNOSIS — I10 Essential (primary) hypertension: Secondary | ICD-10-CM

## 2016-08-17 DIAGNOSIS — A46 Erysipelas: Secondary | ICD-10-CM

## 2016-08-17 MED ORDER — MUPIROCIN 2 % EX OINT: TOPICAL_OINTMENT | CUTANEOUS | 0 refills | Status: DC

## 2016-08-17 NOTE — Progress Notes (Signed)
Pre visit review using our clinic review tool, if applicable. No additional management support is needed unless otherwise documented below in the visit note. 

## 2016-08-17 NOTE — Assessment & Plan Note (Signed)
Blood pressure under good control.  Continue same medication regimen.  Follow pressures.  Follow metabolic panel.   

## 2016-08-17 NOTE — Progress Notes (Signed)
Patient ID: Kristin Avila, female   DOB: 06-May-1928, 83 y.o.   MRN: [id number]   Subjective:    Patient ID: Kristin Avila, female    DOB: [birth year], 83 y.o.   MRN: [id number]  HPI  Patient here as a work in for f/u of urgent care visit.  Went to urgent care ([facility]) 08/05/16 and diagnosed with erysopelas.  Was placed on keflex.  Completed 08/13/16.  Was seen at her eye doctor 07/31/16.  Had laser procedure.  On 08/02/16 noticed swelling and hard place on left cheek.  08/03/16 - face was swollen and itching.  Had fever with tmax 102.4.  Evaluated 08/05/16 as outlined.   Tolerated keflex. Feels back to normal now.  No fever.  No headache.  No swelling.  Few lesions on lateral face.  No redness or swelling now.  No nausea or vomiting.  No diarrhea.     Past Medical History:  Diagnosis Date  . Anemia   . Atrial fibrillation ([place]) 11/13   new onset 11/13  . Hypercholesterolemia   . Hypertension   . Osteoarthritis    hands, feet   Past Surgical History:  Procedure Laterality Date  . APPENDECTOMY  1957  . CATARACT EXTRACTION  2013   left eye  . KIDNEY SURGERY  age 77  . TUBAL LIGATION     Family History  Problem Relation Age of Onset  . Congestive Heart Failure Father   . Multiple myeloma Mother   . Hypertension Brother   . Alzheimer's disease      grandmother and great aunts  . Breast cancer Neg Hx   . Colon cancer Neg Hx    Social History   Social History  . Marital status: Widowed    Spouse name: N/A  . Number of children: 4  . Years of education: N/A   Social History Main Topics  . Smoking status: Never Smoker  . Smokeless tobacco: Never Used  . Alcohol use No     Comment: occasional  . Drug use: No  . Sexual activity: Not Asked   Other Topics Concern  . None   Social History Narrative  . None    Outpatient Encounter Prescriptions as of 08/17/2016  Medication Sig  . amLODipine (NORVASC) 10 MG tablet TAKE ONE TABLET BY MOUTH ONCE DAILY  . aspirin 81 MG  tablet Take 81 mg by mouth daily.  . Cholecalciferol (VITAMIN D-3) 1000 UNITS CAPS Take by mouth.  Marland Kitchen FLUoxetine (PROZAC) 40 MG capsule Take 1 capsule (40 mg total) by mouth daily.  Marland Kitchen lisinopril (PRINIVIL,ZESTRIL) 40 MG tablet TAKE ONE TABLET BY MOUTH ONCE DAILY  . lovastatin (MEVACOR) 20 MG tablet TAKE ONE TABLET BY MOUTH ONCE DAILY  . naproxen sodium (ANAPROX) 220 MG tablet Take 220 mg by mouth 2 (two) times daily with a meal.  . traZODone (DESYREL) 100 MG tablet Take 1 tablet (100 mg total) by mouth at bedtime.  . [DISCONTINUED] FLUoxetine (PROZAC) 40 MG capsule Take 1 capsule (40 mg total) by mouth daily. NEEDS APPT FOR FURTHER REFILLS. CALL OFFICE SOON  . mupirocin ointment (BACTROBAN) 2 % Apply to affected area bid for 5 days   No facility-administered encounter medications on file as of 08/17/2016.     Review of Systems  Constitutional: Negative for appetite change.       Previous fever.  No fever now.    HENT: Negative for congestion and sinus pressure.   Respiratory: Negative for cough and shortness of breath.  Cardiovascular: Negative for chest pain and leg swelling.  Gastrointestinal: Negative for diarrhea, nausea and vomiting.  Skin:       Previous rash and swelling.  Much improved.  Only involved face.    Neurological: Negative for dizziness, light-headedness and headaches.       Objective:    Physical Exam  Constitutional: She appears well-developed and well-nourished. No distress.  HENT:  Nose: Nose normal.  Mouth/Throat: Oropharynx is clear and moist.  Neck: Neck supple.  Cardiovascular: Normal rate and regular rhythm.   Pulmonary/Chest: Breath sounds normal. No respiratory distress. She has no wheezes.  Abdominal: Soft. Bowel sounds are normal. There is no tenderness.  Musculoskeletal: She exhibits no edema or tenderness.  Lymphadenopathy:    She has no cervical adenopathy.  Skin:  Few scabbed lesions - lateral face.  No increased swelling or erythema.       BP 128/64   Pulse 75   Temp 98.2 F (36.8 C) (Oral)   Ht 5' 2"  (1.575 m)   Wt 160 lb 6.4 oz (72.8 kg)   SpO2 96%   BMI 29.34 kg/m  Wt Readings from Last 3 Encounters:  08/17/16 160 lb 6.4 oz (72.8 kg)  05/08/16 152 lb (68.9 kg)  09/26/15 145 lb 3.2 oz (65.9 kg)     Lab Results  Component Value Date   WBC 5.5 04/12/2015   HGB 13.8 04/12/2015   HCT 41.1 04/12/2015   PLT 226.0 04/12/2015   GLUCOSE 83 05/08/2016   CHOL 198 05/08/2016   TRIG 57.0 05/08/2016   HDL 80.40 05/08/2016   LDLDIRECT 105.8 09/06/2013   LDLCALC 106 (H) 05/08/2016   ALT 11 05/08/2016   AST 21 05/08/2016   NA 141 05/08/2016   K 4.0 05/08/2016   CL 104 05/08/2016   CREATININE 0.78 05/08/2016   BUN 22 05/08/2016   CO2 32 05/08/2016   TSH 1.38 04/12/2015    Mm Screening Breast Tomo Bilateral  Result Date: 10/01/2015 CLINICAL DATA:  Screening. EXAM: DIGITAL SCREENING BILATERAL MAMMOGRAM WITH 3D TOMO WITH CAD COMPARISON:  Previous exam(s). ACR Breast Density Category b: There are scattered areas of fibroglandular density. FINDINGS: There are no findings suspicious for malignancy. Images were processed with CAD. IMPRESSION: No mammographic evidence of malignancy. A result letter of this screening mammogram will be mailed directly to the patient. RECOMMENDATION: Screening mammogram in one year. (Code:SM-B-01Y) BI-RADS CATEGORY  1: Negative. Electronically Signed   By: Marin Olp M.D.   On: 10/01/2015 16:26       Assessment & Plan:   Problem List Items Addressed This Visit    Hypertension    Blood pressure under good control.  Continue same medication regimen.  Follow pressures.  Follow metabolic panel.         Other Visit Diagnoses    Erysipelas    -  Primary   was diagnosed with erysipelas.  treated with keflex.  improved.  topical bactroban.  call if any change or problems.     Relevant Medications   mupirocin ointment (BACTROBAN) 2 %       Einar Pheasant, MD

## 2016-09-15 ENCOUNTER — Other Ambulatory Visit: Payer: Self-pay | Admitting: Internal Medicine

## 2016-09-16 NOTE — Telephone Encounter (Signed)
Okay to refill?   LOV: 08/17/16   NOV: 11/26/16

## 2016-09-18 ENCOUNTER — Ambulatory Visit: Payer: Medicare Other | Admitting: Internal Medicine

## 2016-09-23 ENCOUNTER — Ambulatory Visit: Payer: Medicare Other

## 2016-10-16 ENCOUNTER — Other Ambulatory Visit: Payer: Self-pay | Admitting: Internal Medicine

## 2016-10-28 ENCOUNTER — Encounter: Payer: Self-pay | Admitting: Family

## 2016-10-28 ENCOUNTER — Ambulatory Visit (INDEPENDENT_AMBULATORY_CARE_PROVIDER_SITE_OTHER): Payer: Medicare Other | Admitting: Family

## 2016-10-28 VITALS — BP 144/78 | HR 70 | Temp 97.8°F | Ht 62.0 in | Wt 165.6 lb

## 2016-10-28 DIAGNOSIS — H9202 Otalgia, left ear: Secondary | ICD-10-CM | POA: Diagnosis not present

## 2016-10-28 MED ORDER — CIPROFLOXACIN-DEXAMETHASONE 0.3-0.1 % OT SUSP: OTIC | 0 refills | Status: DC

## 2016-10-28 NOTE — Progress Notes (Signed)
Pre visit review using our clinic review tool, if applicable. No additional management support is needed unless otherwise documented below in the visit note. 

## 2016-10-28 NOTE — Progress Notes (Signed)
Subjective:    Patient ID: Kristin Avila, female    DOB: 04-29-1928, 83 y.o.   MRN: [id number]  CC: Kristin Avila is a 83 y.o. female who presents today for an acute visit.    HPI: CC: left ear ache x one day, started all the sudden last night. Describes as ache. No drainage. No cough, fever, sinus congestion, sore throat. Hasn't tried any medications. No sudden changes in hearing        HISTORY:  Past Medical History:  Diagnosis Date  . Anemia   . Atrial fibrillation ([place]) 11/13   new onset 11/13  . Hypercholesterolemia   . Hypertension   . Osteoarthritis    hands, feet   Past Surgical History:  Procedure Laterality Date  . APPENDECTOMY  1957  . CATARACT EXTRACTION  2013   left eye  . KIDNEY SURGERY  age 26  . TUBAL LIGATION     Family History  Problem Relation Age of Onset  . Congestive Heart Failure Father   . Multiple myeloma Mother   . Hypertension Brother   . Alzheimer's disease      grandmother and great aunts  . Breast cancer Neg Hx   . Colon cancer Neg Hx     Allergies: Patient has no known allergies. Current Outpatient Prescriptions on File Prior to Visit  Medication Sig Dispense Refill  . amLODipine (NORVASC) 10 MG tablet TAKE ONE TABLET BY MOUTH ONCE DAILY 90 tablet 1  . aspirin 81 MG tablet Take 81 mg by mouth daily.    . Cholecalciferol (VITAMIN D-3) 1000 UNITS CAPS Take by mouth.    Marland Kitchen FLUoxetine (PROZAC) 40 MG capsule Take 1 capsule (40 mg total) by mouth daily. 30 capsule 2  . FLUoxetine (PROZAC) 40 MG capsule TAKE ONE CAPSULE BY MOUTH ONCE DAILY 30 capsule 2  . lisinopril (PRINIVIL,ZESTRIL) 40 MG tablet TAKE ONE TABLET BY MOUTH ONCE DAILY 90 tablet 1  . lovastatin (MEVACOR) 20 MG tablet TAKE ONE TABLET BY MOUTH ONCE DAILY 90 tablet 1  . mupirocin ointment (BACTROBAN) 2 % Apply to affected area bid for 5 days 22 g 0  . naproxen sodium (ANAPROX) 220 MG tablet Take 220 mg by mouth 2 (two) times daily with a meal.    . traZODone (DESYREL)  100 MG tablet Take 1 tablet (100 mg total) by mouth at bedtime. 30 tablet 2  . traZODone (DESYREL) 100 MG tablet TAKE ONE TABLET BY MOUTH AT BEDTIME 30 tablet 2   No current facility-administered medications on file prior to visit.     Social History  Substance Use Topics  . Smoking status: Never Smoker  . Smokeless tobacco: Never Used  . Alcohol use No     Comment: occasional    Review of Systems  Constitutional: Negative for chills and fever.  HENT: Positive for ear pain. Negative for congestion and ear discharge.   Respiratory: Negative for cough.   Cardiovascular: Negative for chest pain and palpitations.  Gastrointestinal: Negative for nausea and vomiting.      Objective:    BP (!) 144/78   Pulse 70   Temp 97.8 F (36.6 C) (Oral)   Ht 5' 2"  (1.575 m)   Wt 165 lb 9.6 oz (75.1 kg)   SpO2 95%   BMI 30.29 kg/m  BP Readings from Last 3 Encounters:  10/28/16 (!) 144/78  08/17/16 128/64  05/08/16 (!) 150/70     Physical Exam  Constitutional: She appears well-developed and well-nourished.  HENT:  Head: Normocephalic and atraumatic.  Right Ear: Hearing, tympanic membrane, external ear and ear canal normal. No drainage, swelling or tenderness. No foreign bodies. Tympanic membrane is not erythematous and not bulging. No middle ear effusion. No decreased hearing is noted.  Left Ear: Hearing, tympanic membrane, external ear and ear canal normal. No drainage, swelling or tenderness. No foreign bodies. Tympanic membrane is not erythematous and not bulging.  No middle ear effusion. No decreased hearing is noted.  Nose: Nose normal. No rhinorrhea. Right sinus exhibits no maxillary sinus tenderness and no frontal sinus tenderness. Left sinus exhibits no maxillary sinus tenderness and no frontal sinus tenderness.  Mouth/Throat: Uvula is midline, oropharynx is clear and moist and mucous membranes are normal. No oropharyngeal exudate, posterior oropharyngeal edema, posterior  oropharyngeal erythema or tonsillar abscesses.  Crusty flakes seen in left ear and obscuring TM  Eyes: Conjunctivae are normal.  Cardiovascular: Regular rhythm, normal heart sounds and normal pulses.   Pulmonary/Chest: Effort normal and breath sounds normal. She has no wheezes. She has no rhonchi. She has no rales.  Lymphadenopathy:       Head (right side): No submental, no submandibular, no tonsillar, no preauricular, no posterior auricular and no occipital adenopathy present.       Head (left side): No submental, no submandibular, no tonsillar, no preauricular, no posterior auricular and no occipital adenopathy present.    She has no cervical adenopathy.  Neurological: She is alert.  Skin: Skin is warm and dry.  Psychiatric: She has a normal mood and affect. Her speech is normal and behavior is normal. Thought content normal.  Vitals reviewed.      Assessment & Plan:  1. Left ear pain Working diagnosis of otitis externa. Due to pain, I did not feel comfortable irrigating ear. We will trial antibiotic drops safe if TM were to be ruptured first. If does not resolve,, we may trial PO antibiotics.   - ciprofloxacin-dexamethasone (CIPRODEX) otic suspension; 4 gtt in affected ear(s) BID x 7 days.  Dispense: 4 mL; Refill: 0    I am having Kristin Avila start on ciprofloxacin-dexamethasone. I am also having her maintain her aspirin, naproxen sodium, Vitamin D-3, traZODone, FLUoxetine, mupirocin ointment, traZODone, FLUoxetine, lovastatin, amLODipine, and lisinopril.   Meds ordered this encounter  Medications  . ciprofloxacin-dexamethasone (CIPRODEX) otic suspension    Sig: 4 gtt in affected ear(s) BID x 7 days.    Dispense:  4 mL    Refill:  0    Order Specific Question:   Supervising Provider    Answer:   Crecencio Mc [[id number]]    Return precautions given.   Risks, benefits, and alternatives of the medications and treatment plan prescribed today were discussed, and patient expressed  understanding.   Education regarding symptom management and diagnosis given to patient on AVS.  Continue to follow with Einar Pheasant, MD for routine health maintenance.   Kristin Avila and I agreed with plan.   Mable Paris, FNP

## 2016-10-28 NOTE — Patient Instructions (Signed)
Trial of antibiotic ear drops  If that doesn't work, we may need to try oral.   When ear pain resolves, you may also want try ear wax softener, Debrox  Let me know if you are better

## 2016-11-26 ENCOUNTER — Encounter: Payer: Self-pay | Admitting: Internal Medicine

## 2016-11-26 ENCOUNTER — Ambulatory Visit: Payer: Medicare Other

## 2016-11-26 ENCOUNTER — Ambulatory Visit (INDEPENDENT_AMBULATORY_CARE_PROVIDER_SITE_OTHER): Payer: Medicare Other | Admitting: Internal Medicine

## 2016-11-26 VITALS — BP 140/82 | HR 59 | Temp 97.9°F | Resp 12 | Ht 62.0 in | Wt 164.0 lb

## 2016-11-26 DIAGNOSIS — I10 Essential (primary) hypertension: Secondary | ICD-10-CM | POA: Diagnosis not present

## 2016-11-26 DIAGNOSIS — E2839 Other primary ovarian failure: Secondary | ICD-10-CM | POA: Diagnosis not present

## 2016-11-26 DIAGNOSIS — E7800 Pure hypercholesterolemia, unspecified: Secondary | ICD-10-CM

## 2016-11-26 DIAGNOSIS — F419 Anxiety disorder, unspecified: Secondary | ICD-10-CM

## 2016-11-26 DIAGNOSIS — I4891 Unspecified atrial fibrillation: Secondary | ICD-10-CM | POA: Diagnosis not present

## 2016-11-26 LAB — LIPID PANEL
CHOLESTEROL: 219 mg/dL — AB (ref 0–200)
HDL: 91.9 mg/dL (ref >39.00)
LDL Cholesterol: 113 mg/dL — ABNORMAL HIGH (ref 0–99)
NONHDL: 126.69
Total CHOL/HDL Ratio: 2
Triglycerides: 68 mg/dL (ref 0.0–149.0)
VLDL: 13.6 mg/dL (ref 0.0–40.0)

## 2016-11-26 LAB — CBC WITH DIFFERENTIAL/PLATELET
BASOS PCT: 0.7 % (ref 0.0–3.0)
Basophils Absolute: 0 10*3/uL (ref 0.0–0.1)
EOS ABS: 0.1 10*3/uL (ref 0.0–0.7)
EOS PCT: 2.1 % (ref 0.0–5.0)
HCT: 42.3 % (ref 36.0–46.0)
Hemoglobin: 13.9 g/dL (ref 12.0–15.0)
LYMPHS ABS: 1.2 10*3/uL (ref 0.7–4.0)
Lymphocytes Relative: 22 % (ref 12.0–46.0)
MCHC: 32.9 g/dL (ref 30.0–36.0)
MCV: 87 fl (ref 78.0–100.0)
MONO ABS: 0.7 10*3/uL (ref 0.1–1.0)
Monocytes Relative: 12.9 % — ABNORMAL HIGH (ref 3.0–12.0)
NEUTROS ABS: 3.3 10*3/uL (ref 1.4–7.7)
NEUTROS PCT: 62.3 % (ref 43.0–77.0)
PLATELETS: 246 10*3/uL (ref 150.0–400.0)
RBC: 4.86 Mil/uL (ref 3.87–5.11)
RDW: 14.6 % (ref 11.5–15.5)
WBC: 5.3 10*3/uL (ref 4.0–10.5)

## 2016-11-26 LAB — HEPATIC FUNCTION PANEL
ALT: 10 U/L (ref 0–35)
AST: 20 U/L (ref 0–37)
Albumin: 4.2 g/dL (ref 3.5–5.2)
Alkaline Phosphatase: 71 U/L (ref 39–117)
Bilirubin, Direct: 0.1 mg/dL (ref 0.0–0.3)
TOTAL PROTEIN: 7.1 g/dL (ref 6.0–8.3)
Total Bilirubin: 0.5 mg/dL (ref 0.2–1.2)

## 2016-11-26 LAB — BASIC METABOLIC PANEL
BUN: 19 mg/dL (ref 6–23)
CO2: 29 mEq/L (ref 19–32)
Calcium: 9.7 mg/dL (ref 8.4–10.5)
Chloride: 104 mEq/L (ref 96–112)
Creatinine, Ser: 0.91 mg/dL (ref 0.40–1.20)
GFR: 62.24 mL/min (ref >60.00)
GLUCOSE: 94 mg/dL (ref 70–99)
POTASSIUM: 4 meq/L (ref 3.5–5.1)
SODIUM: 139 meq/L (ref 135–145)

## 2016-11-26 LAB — TSH: TSH: 1.69 u[IU]/mL (ref 0.35–4.50)

## 2016-11-26 MED ORDER — TRAZODONE HCL 100 MG PO TABS: 100.0000 mg | ORAL_TABLET | Freq: Every day | ORAL | 6 refills | Status: DC

## 2016-11-26 MED ORDER — FLUOXETINE HCL 40 MG PO CAPS: 40.0000 mg | ORAL_CAPSULE | Freq: Every day | ORAL | 6 refills | Status: DC

## 2016-11-26 NOTE — Assessment & Plan Note (Signed)
Low cholesterol diet and exercise.  Follow lipid panel and liver function tests.  On lovastatin.   

## 2016-11-26 NOTE — Assessment & Plan Note (Signed)
Blood pressure on outside checks controlled.  Slightly elevated.  Today.  Continue same medication regimen.  Follow pressures.  Follow metabolic panel.

## 2016-11-26 NOTE — Progress Notes (Signed)
Patient ID: Kristin Avila, female   DOB: 08/15/1927, 83 y.o.   MRN: [id number]   Subjective:    Patient ID: Kristin Avila, female    DOB: [birth year], 83 y.o.   MRN: [id number]  HPI  Patient here for a scheduled follow up.  States she is doing well.  Previous rash resolved.  Stays active.  No chest pain.  No sob.  No acid reflux.  No abdominal pain.  Bowels moving.  Overall she feels she is doing well.     Past Medical History:  Diagnosis Date  . Anemia   . Atrial fibrillation ([place]) 11/13   new onset 11/13  . Hypercholesterolemia   . Hypertension   . Osteoarthritis    hands, feet   Past Surgical History:  Procedure Laterality Date  . APPENDECTOMY  1957  . CATARACT EXTRACTION  2013   left eye  . KIDNEY SURGERY  age 89  . TUBAL LIGATION     Family History  Problem Relation Age of Onset  . Congestive Heart Failure Father   . Multiple myeloma Mother   . Hypertension Brother   . Alzheimer's disease      grandmother and great aunts  . Breast cancer Neg Hx   . Colon cancer Neg Hx    Social History   Social History  . Marital status: Widowed    Spouse name: N/A  . Number of children: 4  . Years of education: N/A   Social History Main Topics  . Smoking status: Never Smoker  . Smokeless tobacco: Never Used  . Alcohol use No     Comment: occasional  . Drug use: No  . Sexual activity: Not Asked   Other Topics Concern  . None   Social History Narrative  . None    Outpatient Encounter Prescriptions as of 11/26/2016  Medication Sig  . amLODipine (NORVASC) 10 MG tablet TAKE ONE TABLET BY MOUTH ONCE DAILY  . aspirin 81 MG tablet Take 81 mg by mouth daily.  . Cholecalciferol (VITAMIN D-3) 1000 UNITS CAPS Take by mouth.  . ciprofloxacin-dexamethasone (CIPRODEX) otic suspension 4 gtt in affected ear(s) BID x 7 days.  Marland Kitchen FLUoxetine (PROZAC) 40 MG capsule Take 1 capsule (40 mg total) by mouth daily.  Marland Kitchen lisinopril (PRINIVIL,ZESTRIL) 40 MG tablet TAKE ONE TABLET BY  MOUTH ONCE DAILY  . lovastatin (MEVACOR) 20 MG tablet TAKE ONE TABLET BY MOUTH ONCE DAILY  . mupirocin ointment (BACTROBAN) 2 % Apply to affected area bid for 5 days  . naproxen sodium (ANAPROX) 220 MG tablet Take 220 mg by mouth 2 (two) times daily with a meal.  . traZODone (DESYREL) 100 MG tablet Take 1 tablet (100 mg total) by mouth at bedtime.  . [DISCONTINUED] FLUoxetine (PROZAC) 40 MG capsule Take 1 capsule (40 mg total) by mouth daily.  . [DISCONTINUED] FLUoxetine (PROZAC) 40 MG capsule TAKE ONE CAPSULE BY MOUTH ONCE DAILY  . [DISCONTINUED] traZODone (DESYREL) 100 MG tablet Take 1 tablet (100 mg total) by mouth at bedtime.  . [DISCONTINUED] traZODone (DESYREL) 100 MG tablet TAKE ONE TABLET BY MOUTH AT BEDTIME   No facility-administered encounter medications on file as of 11/26/2016.     Review of Systems  Constitutional: Negative for appetite change and unexpected weight change.  HENT: Negative for congestion and sinus pressure.   Respiratory: Negative for cough, chest tightness and shortness of breath.   Cardiovascular: Negative for chest pain, palpitations and leg swelling.  Gastrointestinal: Negative for abdominal pain, diarrhea,  nausea and vomiting.  Genitourinary: Negative for difficulty urinating and dysuria.  Musculoskeletal: Negative for joint swelling and myalgias.  Skin: Negative for color change and rash.  Neurological: Negative for dizziness, light-headedness and headaches.  Psychiatric/Behavioral: Negative for agitation and dysphoric mood.       Objective:    Physical Exam  Constitutional: She appears well-developed and well-nourished. No distress.  HENT:  Nose: Nose normal.  Mouth/Throat: Oropharynx is clear and moist.  Neck: Neck supple. No thyromegaly present.  Cardiovascular: Normal rate and regular rhythm.   Pulmonary/Chest: Breath sounds normal. No respiratory distress. She has no wheezes.  Abdominal: Soft. Bowel sounds are normal. There is no tenderness.   Musculoskeletal: She exhibits no edema or tenderness.  Lymphadenopathy:    She has no cervical adenopathy.  Skin: No rash noted. No erythema.  Psychiatric: She has a normal mood and affect. Her behavior is normal.    BP 140/82 (BP Location: Left Arm, Patient Position: Sitting, Cuff Size: Normal)   Pulse (!) 59   Temp 97.9 F (36.6 C) (Oral)   Resp 12   Ht 5' 2" (1.575 m)   Wt 164 lb (74.4 kg)   SpO2 97%   BMI 30.00 kg/m  Wt Readings from Last 3 Encounters:  11/26/16 164 lb (74.4 kg)  10/28/16 165 lb 9.6 oz (75.1 kg)  08/17/16 160 lb 6.4 oz (72.8 kg)     Lab Results  Component Value Date   WBC 5.3 11/26/2016   HGB 13.9 11/26/2016   HCT 42.3 11/26/2016   PLT 246.0 11/26/2016   GLUCOSE 94 11/26/2016   CHOL 219 (H) 11/26/2016   TRIG 68.0 11/26/2016   HDL 91.90 11/26/2016   LDLDIRECT 105.8 09/06/2013   LDLCALC 113 (H) 11/26/2016   ALT 10 11/26/2016   AST 20 11/26/2016   NA 139 11/26/2016   K 4.0 11/26/2016   CL 104 11/26/2016   CREATININE 0.91 11/26/2016   BUN 19 11/26/2016   CO2 29 11/26/2016   TSH 1.69 11/26/2016    Mm Screening Breast Tomo Bilateral  Result Date: 10/01/2015 CLINICAL DATA:  Screening. EXAM: DIGITAL SCREENING BILATERAL MAMMOGRAM WITH 3D TOMO WITH CAD COMPARISON:  Previous exam(s). ACR Breast Density Category b: There are scattered areas of fibroglandular density. FINDINGS: There are no findings suspicious for malignancy. Images were processed with CAD. IMPRESSION: No mammographic evidence of malignancy. A result letter of this screening mammogram will be mailed directly to the patient. RECOMMENDATION: Screening mammogram in one year. (Code:SM-B-01Y) BI-RADS CATEGORY  1: Negative. Electronically Signed   By: Marin Olp M.D.   On: 10/01/2015 16:26       Assessment & Plan:   Problem List Items Addressed This Visit    Anxiety    On prozac.  Stable.  Follow.       Relevant Medications   traZODone (DESYREL) 100 MG tablet   FLUoxetine (PROZAC)  40 MG capsule   Atrial fibrillation ([facility])    Followed by Dr Nehemiah Massed.  Stable.  Appears to be in SR.        Relevant Orders   TSH (Completed)   Hypercholesterolemia    Low cholesterol diet and exercise.  Follow lipid panel and liver function tests.  On lovastatin.        Relevant Orders   Lipid panel (Completed)   Hepatic function panel (Completed)   Hypertension    Blood pressure on outside checks controlled.  Slightly elevated.  Today.  Continue same medication regimen.  Follow pressures.  Follow metabolic panel.        Relevant Orders   CBC with Differential/Platelet (Completed)   Basic metabolic panel (Completed)    Other Visit Diagnoses    Estrogen deficiency    -  Primary   Relevant Orders   DG Bone Density       Einar Pheasant, MD

## 2016-11-26 NOTE — Assessment & Plan Note (Signed)
Followed by Dr Nehemiah Massed.  Stable.  Appears to be in SR.

## 2016-11-26 NOTE — Progress Notes (Signed)
Pre-visit discussion using our clinic review tool. No additional management support is needed unless otherwise documented below in the visit note.  

## 2016-11-27 ENCOUNTER — Telehealth: Payer: Self-pay

## 2016-11-27 NOTE — Telephone Encounter (Signed)
Spoke to pateint went over all information app made for cpe letter mailed with app date and time.

## 2016-11-27 NOTE — Telephone Encounter (Signed)
Pt called back returning your call. Thank you!  Call pt @ [phone]

## 2016-11-27 NOTE — Telephone Encounter (Signed)
-----   Message from Einar Pheasant, MD sent at 11/27/2016  4:29 AM EDT ----- Notify pt that her cholesterol increased slightly when compared to the previous check.  Make sure taking lovastatin daily.  Low cholesterol diet.  We will follow.  hgb, thyroid test, kidney function tests and liver function tests are wnl.  Also, she left without scheduling her f/u.  She needs a physical scheduled in 6 months.

## 2016-11-27 NOTE — Telephone Encounter (Signed)
Left message to return call to our office.  

## 2016-12-04 ENCOUNTER — Ambulatory Visit: Payer: Medicare Other

## 2016-12-07 ENCOUNTER — Encounter: Payer: Self-pay | Admitting: Internal Medicine

## 2016-12-07 NOTE — Assessment & Plan Note (Signed)
On prozac.  Stable.  Follow.  

## 2016-12-08 ENCOUNTER — Emergency Department: Payer: Medicare Other

## 2016-12-08 ENCOUNTER — Observation Stay
Admission: EM | Admit: 2016-12-08 | Discharge: 2016-12-15 | Disposition: A | Discharge status: Home / Self Care | Payer: Medicare Other | Attending: Internal Medicine | Admitting: Internal Medicine

## 2016-12-08 ENCOUNTER — Encounter: Payer: Self-pay | Admitting: Emergency Medicine

## 2016-12-08 DIAGNOSIS — I6523 Occlusion and stenosis of bilateral carotid arteries: Secondary | ICD-10-CM | POA: Diagnosis not present

## 2016-12-08 DIAGNOSIS — M19072 Primary osteoarthritis, left ankle and foot: Secondary | ICD-10-CM | POA: Diagnosis not present

## 2016-12-08 DIAGNOSIS — E871 Hypo-osmolality and hyponatremia: Secondary | ICD-10-CM | POA: Diagnosis not present

## 2016-12-08 DIAGNOSIS — R55 Syncope and collapse: Secondary | ICD-10-CM

## 2016-12-08 DIAGNOSIS — Z7982 Long term (current) use of aspirin: Secondary | ICD-10-CM | POA: Diagnosis not present

## 2016-12-08 DIAGNOSIS — Z820 Family history of epilepsy and other diseases of the nervous system: Secondary | ICD-10-CM | POA: Insufficient documentation

## 2016-12-08 DIAGNOSIS — M1990 Unspecified osteoarthritis, unspecified site: Secondary | ICD-10-CM | POA: Insufficient documentation

## 2016-12-08 DIAGNOSIS — E860 Dehydration: Secondary | ICD-10-CM | POA: Diagnosis not present

## 2016-12-08 DIAGNOSIS — Z9842 Cataract extraction status, left eye: Secondary | ICD-10-CM | POA: Diagnosis not present

## 2016-12-08 DIAGNOSIS — Z808 Family history of malignant neoplasm of other organs or systems: Secondary | ICD-10-CM | POA: Insufficient documentation

## 2016-12-08 DIAGNOSIS — E7800 Pure hypercholesterolemia, unspecified: Secondary | ICD-10-CM | POA: Insufficient documentation

## 2016-12-08 DIAGNOSIS — Y93G1 Activity, food preparation and clean up: Secondary | ICD-10-CM | POA: Insufficient documentation

## 2016-12-08 DIAGNOSIS — I951 Orthostatic hypotension: Secondary | ICD-10-CM | POA: Insufficient documentation

## 2016-12-08 DIAGNOSIS — M19071 Primary osteoarthritis, right ankle and foot: Secondary | ICD-10-CM | POA: Insufficient documentation

## 2016-12-08 DIAGNOSIS — I10 Essential (primary) hypertension: Secondary | ICD-10-CM | POA: Diagnosis not present

## 2016-12-08 DIAGNOSIS — I480 Paroxysmal atrial fibrillation: Secondary | ICD-10-CM | POA: Diagnosis not present

## 2016-12-08 DIAGNOSIS — S0101XA Laceration without foreign body of scalp, initial encounter: Secondary | ICD-10-CM | POA: Insufficient documentation

## 2016-12-08 DIAGNOSIS — Z79899 Other long term (current) drug therapy: Secondary | ICD-10-CM | POA: Diagnosis not present

## 2016-12-08 DIAGNOSIS — Z7901 Long term (current) use of anticoagulants: Secondary | ICD-10-CM | POA: Insufficient documentation

## 2016-12-08 DIAGNOSIS — D649 Anemia, unspecified: Secondary | ICD-10-CM | POA: Diagnosis not present

## 2016-12-08 DIAGNOSIS — Z8249 Family history of ischemic heart disease and other diseases of the circulatory system: Secondary | ICD-10-CM | POA: Diagnosis not present

## 2016-12-08 DIAGNOSIS — K5900 Constipation, unspecified: Secondary | ICD-10-CM | POA: Diagnosis not present

## 2016-12-08 DIAGNOSIS — M19042 Primary osteoarthritis, left hand: Secondary | ICD-10-CM | POA: Diagnosis not present

## 2016-12-08 DIAGNOSIS — I495 Sick sinus syndrome: Principal | ICD-10-CM | POA: Insufficient documentation

## 2016-12-08 DIAGNOSIS — F419 Anxiety disorder, unspecified: Secondary | ICD-10-CM | POA: Diagnosis not present

## 2016-12-08 DIAGNOSIS — M19041 Primary osteoarthritis, right hand: Secondary | ICD-10-CM | POA: Insufficient documentation

## 2016-12-08 DIAGNOSIS — Z950 Presence of cardiac pacemaker: Secondary | ICD-10-CM

## 2016-12-08 HISTORY — DX: Syncope and collapse: R55

## 2016-12-08 LAB — URINALYSIS, COMPLETE (UACMP) WITH MICROSCOPIC
BACTERIA UA: NONE SEEN
BILIRUBIN URINE: NEGATIVE
Glucose, UA: NEGATIVE mg/dL
Hgb urine dipstick: NEGATIVE
KETONES UR: NEGATIVE mg/dL
Leukocytes, UA: NEGATIVE
Nitrite: NEGATIVE
PROTEIN: NEGATIVE mg/dL
SQUAMOUS EPITHELIAL / LPF: NONE SEEN
Specific Gravity, Urine: 1.002 — ABNORMAL LOW (ref 1.005–1.030)
pH: 6 (ref 5.0–8.0)

## 2016-12-08 LAB — CBC
HCT: 40.5 % (ref 35.0–47.0)
Hemoglobin: 13.5 g/dL (ref 12.0–16.0)
MCH: 28.6 pg (ref 26.0–34.0)
MCHC: 33.4 g/dL (ref 32.0–36.0)
MCV: 85.7 fL (ref 80.0–100.0)
PLATELETS: 218 10*3/uL (ref 150–440)
RBC: 4.72 MIL/uL (ref 3.80–5.20)
RDW: 14.8 % — ABNORMAL HIGH (ref 11.5–14.5)
WBC: 8.1 10*3/uL (ref 3.6–11.0)

## 2016-12-08 LAB — MAGNESIUM: MAGNESIUM: 2 mg/dL (ref 1.7–2.4)

## 2016-12-08 LAB — BASIC METABOLIC PANEL
Anion gap: 7 (ref 5–15)
BUN: 21 mg/dL — ABNORMAL HIGH (ref 6–20)
CALCIUM: 9.4 mg/dL (ref 8.9–10.3)
CO2: 25 mmol/L (ref 22–32)
Chloride: 102 mmol/L (ref 101–111)
Creatinine, Ser: 0.76 mg/dL (ref 0.44–1.00)
GFR calc non Af Amer: >60 mL/min (ref >60)
Glucose, Bld: 111 mg/dL — ABNORMAL HIGH (ref 65–99)
Potassium: 3.8 mmol/L (ref 3.5–5.1)
Sodium: 134 mmol/L — ABNORMAL LOW (ref 135–145)

## 2016-12-08 LAB — TROPONIN I
TROPONIN I: 0.03 ng/mL — AB (ref <0.03)
Troponin I: 0.03 ng/mL (ref <0.03)

## 2016-12-08 MED ORDER — SODIUM CHLORIDE 0.9 % IV SOLN: 250.0000 mL | INTRAVENOUS | Status: DC | PRN

## 2016-12-08 MED ORDER — ACETAMINOPHEN 650 MG RE SUPP: 650.0000 mg | Freq: Four times a day (QID) | RECTAL | Status: DC | PRN

## 2016-12-08 MED ORDER — LISINOPRIL 20 MG PO TABS
40.0000 mg | ORAL_TABLET | Freq: Every day | ORAL | Status: DC
  Administered 2016-12-09 – 2016-12-15 (×7): 40 mg via ORAL
  Filled 2016-12-08 (×7): qty 2

## 2016-12-08 MED ORDER — ONDANSETRON HCL 4 MG/2ML IJ SOLN: 4.0000 mg | Freq: Four times a day (QID) | INTRAMUSCULAR | Status: DC | PRN

## 2016-12-08 MED ORDER — ASPIRIN 81 MG PO CHEW
81.0000 mg | CHEWABLE_TABLET | Freq: Every day | ORAL | Status: DC
  Administered 2016-12-09 – 2016-12-15 (×7): 81 mg via ORAL
  Filled 2016-12-08 (×7): qty 1

## 2016-12-08 MED ORDER — TRAZODONE HCL 100 MG PO TABS
100.0000 mg | ORAL_TABLET | Freq: Every day | ORAL | Status: DC
  Administered 2016-12-08 – 2016-12-14 (×7): 100 mg via ORAL
  Filled 2016-12-08 (×7): qty 1

## 2016-12-08 MED ORDER — SODIUM CHLORIDE 0.9% FLUSH
3.0000 mL | Freq: Two times a day (BID) | INTRAVENOUS | Status: DC
  Administered 2016-12-08 – 2016-12-15 (×10): 3 mL via INTRAVENOUS

## 2016-12-08 MED ORDER — SODIUM CHLORIDE 0.9% FLUSH: 3.0000 mL | INTRAVENOUS | Status: DC | PRN

## 2016-12-08 MED ORDER — SODIUM CHLORIDE 0.9 % IV SOLN
INTRAVENOUS | Status: AC
  Administered 2016-12-08 – 2016-12-09 (×2): via INTRAVENOUS

## 2016-12-08 MED ORDER — ONDANSETRON HCL 4 MG PO TABS
4.0000 mg | ORAL_TABLET | Freq: Four times a day (QID) | ORAL | Status: DC | PRN
  Filled 2016-12-08: qty 1

## 2016-12-08 MED ORDER — HYDROCODONE-ACETAMINOPHEN 5-325 MG PO TABS
1.0000 | ORAL_TABLET | ORAL | Status: DC | PRN
  Administered 2016-12-14: 2 via ORAL
  Filled 2016-12-08: qty 2

## 2016-12-08 MED ORDER — AMLODIPINE BESYLATE 10 MG PO TABS
10.0000 mg | ORAL_TABLET | Freq: Every day | ORAL | Status: DC
  Administered 2016-12-09 – 2016-12-15 (×7): 10 mg via ORAL
  Filled 2016-12-08: qty 1
  Filled 2016-12-08 (×2): qty 2
  Filled 2016-12-08: qty 1
  Filled 2016-12-08 (×3): qty 2

## 2016-12-08 MED ORDER — ACETAMINOPHEN 325 MG PO TABS: 650.0000 mg | ORAL_TABLET | Freq: Four times a day (QID) | ORAL | Status: DC | PRN

## 2016-12-08 MED ORDER — ALBUTEROL SULFATE (2.5 MG/3ML) 0.083% IN NEBU: 2.5000 mg | INHALATION_SOLUTION | RESPIRATORY_TRACT | Status: DC | PRN

## 2016-12-08 MED ORDER — FLUOXETINE HCL 20 MG PO CAPS
40.0000 mg | ORAL_CAPSULE | Freq: Every day | ORAL | Status: DC
  Administered 2016-12-09 – 2016-12-15 (×7): 40 mg via ORAL
  Filled 2016-12-08 (×7): qty 2

## 2016-12-08 MED ORDER — ENOXAPARIN SODIUM 40 MG/0.4ML ~~LOC~~ SOLN
40.0000 mg | SUBCUTANEOUS | Status: DC
  Administered 2016-12-09 – 2016-12-13 (×5): 40 mg via SUBCUTANEOUS
  Filled 2016-12-08 (×5): qty 0.4

## 2016-12-08 MED ORDER — PRAVASTATIN SODIUM 20 MG PO TABS
20.0000 mg | ORAL_TABLET | Freq: Every day | ORAL | Status: DC
  Administered 2016-12-09 – 2016-12-14 (×6): 20 mg via ORAL
  Filled 2016-12-08 (×6): qty 1

## 2016-12-08 MED ORDER — SENNOSIDES-DOCUSATE SODIUM 8.6-50 MG PO TABS
1.0000 | ORAL_TABLET | Freq: Every evening | ORAL | Status: DC | PRN
Start: 2016-12-08 — End: 2016-12-15

## 2016-12-08 NOTE — ED Provider Notes (Signed)
[facility] Emergency Department Provider Note  ____________________________________________   First MD Initiated Contact with Patient 12/08/16 1814     (approximate)  I have reviewed the triage vital signs and the nursing notes.   HISTORY  Chief Complaint Fall and Head Injury   HPI Kristin Avila is a 83 y.o. female with a history of atrial fibrillation on aspirin was presenting to the emergency department today with 2 episodes of passing out. She says that there were no preceding symptoms. She said that she was standing both sides but did not feel any palpitations or lightheadedness. She says that she feels normal at this time. Neither episode was witnessed. However, the patient says that she did not lose bowel or bladder continence. Denies any headache or neck pain at this time. No seizure history. Was possibly down for 2-3 minutes. Patient denies feeling ill lately. Denies any fever or cough. Denies any burning with urination. Eating and drinking as normal.   Past Medical History:  Diagnosis Date  . Anemia   . Atrial fibrillation ([place]) 11/13   new onset 11/13  . Hypercholesterolemia   . Hypertension   . Osteoarthritis    hands, feet    Patient Active Problem List   Diagnosis Date Noted  . Decreased hearing 05/21/2015  . Constipation 01/13/2015  . Health care maintenance 10/14/2014  . Anxiety 10/02/2012  . Atrial fibrillation ([place]) 07/09/2012  . Osteoarthritis 07/08/2012  . Hypercholesterolemia 07/08/2012  . Hypertension 07/08/2012    Past Surgical History:  Procedure Laterality Date  . APPENDECTOMY  1957  . CATARACT EXTRACTION  2013   left eye  . KIDNEY SURGERY  age 82  . TUBAL LIGATION      Prior to Admission medications   Medication Sig Start Date End Date Taking? Authorizing Provider  amLODipine (NORVASC) 10 MG tablet TAKE ONE TABLET BY MOUTH ONCE DAILY 10/16/16   Crecencio Mc, MD  aspirin 81 MG tablet Take 81 mg by mouth  daily.    Historical Provider, MD  Cholecalciferol (VITAMIN D-3) 1000 UNITS CAPS Take by mouth.    Historical Provider, MD  ciprofloxacin-dexamethasone (CIPRODEX) otic suspension 4 gtt in affected ear(s) BID x 7 days. 10/28/16   Burnard Hawthorne, FNP  FLUoxetine (PROZAC) 40 MG capsule Take 1 capsule (40 mg total) by mouth daily. 11/26/16   Einar Pheasant, MD  lisinopril (PRINIVIL,ZESTRIL) 40 MG tablet TAKE ONE TABLET BY MOUTH ONCE DAILY 10/16/16   Crecencio Mc, MD  lovastatin (MEVACOR) 20 MG tablet TAKE ONE TABLET BY MOUTH ONCE DAILY 10/16/16   Crecencio Mc, MD  mupirocin ointment (BACTROBAN) 2 % Apply to affected area bid for 5 days 08/17/16   Einar Pheasant, MD  naproxen sodium (ANAPROX) 220 MG tablet Take 220 mg by mouth 2 (two) times daily with a meal.    Historical Provider, MD  traZODone (DESYREL) 100 MG tablet Take 1 tablet (100 mg total) by mouth at bedtime. 11/26/16   Einar Pheasant, MD    Allergies Patient has no known allergies.  Family History  Problem Relation Age of Onset  . Congestive Heart Failure Father   . Multiple myeloma Mother   . Hypertension Brother   . Alzheimer's disease      grandmother and great aunts  . Breast cancer Neg Hx   . Colon cancer Neg Hx     Social History Social History  Substance Use Topics  . Smoking status: Never Smoker  . Smokeless tobacco: Never  Used  . Alcohol use No     Comment: occasional    Review of Systems  Constitutional: No fever/chills Eyes: No visual changes. ENT: No sore throat. Cardiovascular: Denies chest pain. Respiratory: Denies shortness of breath. Gastrointestinal: No abdominal pain.  No nausea, no vomiting.  No diarrhea.  No constipation. Genitourinary: Negative for dysuria. Musculoskeletal: Negative for back pain. Skin: Negative for rash. Neurological: Negative for headaches, focal weakness or numbness.   ____________________________________________   PHYSICAL EXAM:  VITAL SIGNS: ED Triage Vitals  [12/08/16 1756]  Enc Vitals Group     BP (!) 167/65     Pulse Rate 72     Resp 20     Temp 97.7 F (36.5 C)     Temp Source Oral     SpO2 93 %     Weight 164 lb (74.4 kg)     Height 5' 2"  (1.575 m)     Head Circumference      Peak Flow      Pain Score 10     Pain Loc      Pain Edu?      Excl. in [place]?     Constitutional: Alert and oriented. Well appearing and in no acute distress. Eyes: Conjunctivae are normal. PERRL. EOMI. Head: 2 x 3 cm raised hematoma to the right occiput. No depression or bogginess. Nose: No congestion/rhinnorhea. Mouth/Throat: Mucous membranes are moist.   Neck: No stridor.  No tenderness palpation of the cervical spine. Ranges freely without any restriction. Cardiovascular: Normal rate, regular rhythm. Grossly normal heart sounds.   Respiratory: Normal respiratory effort.  No retractions. Lungs CTAB. Gastrointestinal: Soft and nontender. No distention.  Musculoskeletal: No lower extremity tenderness nor edema.  No joint effusions. Neurologic:  Normal speech and language. No gross focal neurologic deficits are appreciated.  Skin:  Skin is warm, dry and intact. No rash noted. Psychiatric: Mood and affect are normal. Speech and behavior are normal.  ____________________________________________   LABS (all labs ordered are listed, but only abnormal results are displayed)  Labs Reviewed  BASIC METABOLIC PANEL - Abnormal; Notable for the following:       Result Value   Sodium 134 (*)    Glucose, Bld 111 (*)    BUN 21 (*)    All other components within normal limits  CBC - Abnormal; Notable for the following:    RDW 14.8 (*)    All other components within normal limits  TROPONIN I - Abnormal; Notable for the following:    Troponin I 0.03 (*)    All other components within normal limits  URINALYSIS, COMPLETE (UACMP) WITH MICROSCOPIC   ____________________________________________  EKG  ED ECG REPORT I, Doran Stabler, the attending physician,  personally viewed and interpreted this ECG.   Date: 12/08/2016  EKG Time: 1812  Rate: 73  Rhythm: normal sinus rhythm  Axis: normal  Intervals:none  ST&T Change: No ST segment elevation or depression. No abnormal T-wave inversion. Wondering baseline 23 and aVF which is likely where the machine read the EKG is an elevated ST segment.  ____________________________________________  RADIOLOGY  CT Head Wo Contrast (Final result)  Result time 12/08/16 18:38:59  Final result by Gilford Silvius, MD (12/08/16 18:38:59)           Narrative:   CLINICAL DATA: Fall with hematoma on the head. Initial encounter.  EXAM: CT HEAD WITHOUT CONTRAST  TECHNIQUE: Contiguous axial images were obtained from the base of the skull through the vertex  without intravenous contrast.  COMPARISON: 08/20/2012  FINDINGS: Brain: Normal. No evidence of acute or remote infarction, hemorrhage, hydrocephalus, extra-axial collection or mass lesion/mass effect.  Vascular: Atherosclerotic calcification. No hyperdense vessel.  Skull: Right parietal scalp hematoma. Negative for fracture  Sinuses/Orbits: No evidence of injury  IMPRESSION: 1. No evidence of intracranial injury. 2. Right parietal scalp hematoma without fracture.   Electronically Signed By: Monte Fantasia M.D. On: 12/08/2016 18:38            ____________________________________________   PROCEDURES  Procedure(s) performed:   Procedures  Critical Care performed:   ____________________________________________   INITIAL IMPRESSION / ASSESSMENT AND PLAN / ED COURSE  Pertinent labs & imaging results that were available during my care of the patient were reviewed by me and considered in my medical decision making (see chart for details).  ----------------------------------------- 7:05 PM on 12/08/2016 -----------------------------------------  Patient with elevated troponin. Suspect arrhythmia causing the  patient's syncopal episodes. She'll be admitted to the hospital. Signed out to Dr. Bridgett Larsson of the medicine service. The patient the family aware of the plan as well as the lab results and are willing to comply.      ____________________________________________   FINAL CLINICAL IMPRESSION(S) / ED DIAGNOSES  Syncope.    NEW MEDICATIONS STARTED DURING THIS VISIT:  New Prescriptions   No medications on file     Note:  This document was prepared using Dragon voice recognition software and may include unintentional dictation errors.    Orbie Pyo, MD 12/08/16 Drema Halon

## 2016-12-08 NOTE — ED Notes (Signed)
Pt assisted to toilet to urinate 

## 2016-12-08 NOTE — ED Triage Notes (Signed)
Pt to ed with c/o falling over the last few days.  Pt states she does not remember why she has been falling and does not remember passing out.  Pt fell today while washing dishes and unresponsive for a few seconds per family.  Pt also reports passing out yesterday and falling, and then also passing out and falling on Saturday.  Pt reports pain in back and right side/back of head hematoma.  Pt alert and oriented at this time.

## 2016-12-08 NOTE — H&P (Signed)
[facility] [place] NAME: Kristin Avila    MR#:  [id number]  DATE OF BIRTH:  1928-01-17  DATE OF ADMISSION:  12/08/2016  PRIMARY CARE PHYSICIAN: Einar Pheasant, MD   REQUESTING/REFERRING PHYSICIAN: Orbie Pyo, MD  CHIEF COMPLAINT:   Chief Complaint  Patient presents with  . Fall  . Head Injury   Passed out today. HISTORY OF PRESENT ILLNESS:  Kristin Avila  is a 83 y.o. female with a known history of hypertension, anemia, A. Fib and hyperlipidemia. The patient Presented in ED with above complaint. The patient complains of syncope episode today without preceding symptoms.she had similar syncope episode 2 days ago per daughter.the patient denies any headache or dizziness, no chest pain, palpitation, nausea, confusion or incontinence. She fell and hit her head. CT of the head did not show CVA but scalp hematoma.  PAST MEDICAL HISTORY:   Past Medical History:  Diagnosis Date  . Anemia   . Atrial fibrillation ([place]) 11/13   new onset 11/13  . Hypercholesterolemia   . Hypertension   . Osteoarthritis    hands, feet    PAST SURGICAL HISTORY:   Past Surgical History:  Procedure Laterality Date  . APPENDECTOMY  1957  . CATARACT EXTRACTION  2013   left eye  . KIDNEY SURGERY  age 64  . TUBAL LIGATION      SOCIAL HISTORY:   Social History  Substance Use Topics  . Smoking status: Never Smoker  . Smokeless tobacco: Never Used  . Alcohol use No     Comment: occasional    FAMILY HISTORY:   Family History  Problem Relation Age of Onset  . Congestive Heart Failure Father   . Multiple myeloma Mother   . Hypertension Brother   . Alzheimer's disease      grandmother and great aunts  . Breast cancer Neg Hx   . Colon cancer Neg Hx     DRUG ALLERGIES:  No Known Allergies  REVIEW OF SYSTEMS:   Review of Systems  Constitutional: Negative for chills, fever and malaise/fatigue.  HENT: Positive for hearing  loss. Negative for congestion and tinnitus.   Eyes: Negative for blurred vision and double vision.  Respiratory: Negative for cough, shortness of breath and stridor.   Cardiovascular: Negative for chest pain, palpitations and leg swelling.  Gastrointestinal: Negative for abdominal pain, blood in stool, constipation, diarrhea, melena, nausea and vomiting.  Genitourinary: Negative for dysuria and hematuria.  Musculoskeletal: Negative for back pain.  Skin: Negative for itching and rash.  Neurological: Negative for dizziness, tingling, sensory change, speech change, focal weakness, seizures, loss of consciousness and headaches.  Psychiatric/Behavioral: Negative for depression. The patient is not nervous/anxious.     MEDICATIONS AT HOME:   Prior to Admission medications   Medication Sig Start Date End Date Taking? Authorizing Provider  amLODipine (NORVASC) 10 MG tablet TAKE ONE TABLET BY MOUTH ONCE DAILY 10/16/16  Yes Crecencio Mc, MD  aspirin 81 MG tablet Take 81 mg by mouth daily.   Yes Historical Provider, MD  Cholecalciferol (VITAMIN D-3) 1000 UNITS CAPS Take 1 capsule by mouth daily.    Yes Historical Provider, MD  ciprofloxacin-dexamethasone (CIPRODEX) otic suspension 4 gtt in affected ear(s) BID x 7 days. Patient not taking: Reported on 12/08/2016 10/28/16   Burnard Hawthorne, FNP  FLUoxetine (PROZAC) 40 MG capsule Take 1 capsule (40 mg total) by mouth daily. 11/26/16   Einar Pheasant, MD  lisinopril (PRINIVIL,ZESTRIL) 40  MG tablet TAKE ONE TABLET BY MOUTH ONCE DAILY 10/16/16   Crecencio Mc, MD  lovastatin (MEVACOR) 20 MG tablet TAKE ONE TABLET BY MOUTH ONCE DAILY 10/16/16   Crecencio Mc, MD  mupirocin ointment (BACTROBAN) 2 % Apply to affected area bid for 5 days Patient not taking: Reported on 12/08/2016 08/17/16   Einar Pheasant, MD  naproxen sodium (ANAPROX) 220 MG tablet Take 220 mg by mouth 2 (two) times daily with a meal.    Historical Provider, MD  traZODone (DESYREL) 100 MG tablet Take  1 tablet (100 mg total) by mouth at bedtime. 11/26/16   Einar Pheasant, MD      VITAL SIGNS:  Blood pressure (!) 167/65, pulse 72, temperature 97.7 F (36.5 C), temperature source Oral, resp. rate 20, height _0  (1.575 m), weight 164 lb (74.4 kg), SpO2 93 %.  PHYSICAL EXAMINATION:  Physical Exam  GENERAL:  83 y.o.-year-old patient lying in the bed with no acute distress.  EYES: Pupils equal, round, reactive to light and accommodation. No scleral icterus. Extraocular muscles intact.  HEENT: Head atraumatic, normocephalic. Oropharynx and nasopharynx clear. Scalp hematoma on the right back of head. NECK:  Supple, no jugular venous distention. No thyroid enlargement, no tenderness.  LUNGS: Normal breath sounds bilaterally, no wheezing, rales,rhonchi or crepitation. No use of accessory muscles of respiration.  CARDIOVASCULAR: S1, S2 normal. No murmurs, rubs, or gallops.  ABDOMEN: Soft, nontender, nondistended. Bowel sounds present. No organomegaly or mass.  EXTREMITIES: No pedal edema, cyanosis, or clubbing.  NEUROLOGIC: Cranial nerves II through XII are intact. Muscle strength 5/5 in all extremities. Sensation intact. Gait not checked.  PSYCHIATRIC: The patient is alert and oriented x 3.  SKIN: No obvious rash, lesion, or ulcer.   LABORATORY PANEL:   CBC  Recent Labs Lab 12/08/16 1817  WBC 8.1  HGB 13.5  HCT 40.5  PLT 218   ------------------------------------------------------------------------------------------------------------------  Chemistries   Recent Labs Lab 12/08/16 1817  NA 134*  K 3.8  CL 102  CO2 25  GLUCOSE 111*  BUN 21*  CREATININE 0.76  CALCIUM 9.4   ------------------------------------------------------------------------------------------------------------------  Cardiac Enzymes  Recent Labs Lab 12/08/16 1817  TROPONINI 0.03*    ------------------------------------------------------------------------------------------------------------------  RADIOLOGY:  Ct Head Wo Contrast  Result Date: 12/08/2016 CLINICAL DATA:  Fall with hematoma on the head.  Initial encounter. EXAM: CT HEAD WITHOUT CONTRAST TECHNIQUE: Contiguous axial images were obtained from the base of the skull through the vertex without intravenous contrast. COMPARISON:  08/20/2012 FINDINGS: Brain: Normal. No evidence of acute or remote infarction, hemorrhage, hydrocephalus, extra-axial collection or mass lesion/mass effect. Vascular: Atherosclerotic calcification.  No hyperdense vessel. Skull: Right parietal scalp hematoma.  Negative for fracture Sinuses/Orbits: No evidence of injury IMPRESSION: 1. No evidence of intracranial injury. 2. Right parietal scalp hematoma without fracture. Electronically Signed   By: Monte Fantasia M.D.   On: 12/08/2016 18:38      IMPRESSION AND PLAN:   Syncope. Unclear etiology.need to rule out arrhythmia. The patient will be placed for observation. Telemetry monitor,get echocardiogram and carotid duplex, cardiology consult.  Hyponatremia and mild  Dehydration.Start normal saline IV and follow-up BMP. Hypertension. Continue home hypertension medication. History of A. Fib, now sinus rhythm.continued ASA.  All the records are reviewed and case discussed with ED provider. Management plans discussed with the patient, her daughter and son and they are in agreement.  CODE STATUS: full code.  TOTAL TIME TAKING CARE OF THIS PATIENT: 48 minutes.    Demetrios Loll M.D  on 12/08/2016 at 7:26 PM  Between 7am to 6pm - Pager - [phone]  After 6pm go to [email or web address] - Proofreader  [employer] [facility]  Office  [phone]  CC: Primary care physician; Einar Pheasant, MD   Note: This dictation was prepared with Dragon dictation along with smaller phrase technology. Any transcriptional errors that  result from this process are unintentional.

## 2016-12-09 ENCOUNTER — Observation Stay: Payer: Medicare Other

## 2016-12-09 ENCOUNTER — Observation Stay
Admit: 2016-12-09 | Discharge: 2016-12-09 | Disposition: A | Discharge status: Home / Self Care | Payer: Medicare Other | Attending: Internal Medicine | Admitting: Internal Medicine

## 2016-12-09 DIAGNOSIS — R55 Syncope and collapse: Secondary | ICD-10-CM

## 2016-12-09 MED ORDER — DIPHENHYDRAMINE HCL 25 MG PO CAPS
25.0000 mg | ORAL_CAPSULE | Freq: Every evening | ORAL | Status: DC | PRN
  Administered 2016-12-10: 25 mg via ORAL
  Filled 2016-12-09: qty 1

## 2016-12-09 NOTE — Evaluation (Signed)
Physical Therapy Evaluation Patient Details Name: Kristin Avila MRN: [id number] DOB: 1928-05-15 Today's Date: 12/09/2016   History of Present Illness  Patient is an 83 y/o female that presents after multiple syncopal episodes. She has a history of A-fib, CT scan was negative. She was in the kitchen during her episodes, does not recall what activities she was doing prior.   Clinical Impression  Patient was evaluated after multiple syncopal episodes at home in her kitchen. She has been independent with ambulation at home with no other syncopal episodes or falls reported. She lives at home with her grandson and his children. Of note her BP did decrease from sitting to standing on the two occasions PT monitored (pre and post session. Initial seated measure was (139/61 seated) to 120/62 standing before ambulation and 149/64 seated after ambulation to 132/61 standing. Patient was not symptomatic throughout session, HR remained relatively constant. She did mention blurry vision, though this appears chronic. She appears at her physical baseline, the only abnormality found in this session was orthostatic BP, though her report of the episodes did not involve a position change.     Follow Up Recommendations No PT follow up    Equipment Recommendations       Recommendations for Other Services       Precautions / Restrictions Precautions Precautions: Fall Restrictions Weight Bearing Restrictions: No      Mobility  Bed Mobility               General bed mobility comments: Patient seated in bedside recliner.   Transfers Overall transfer level: Modified independent               General transfer comment: Patient requires mild use of UEs, no loss of balance noted.   Ambulation/Gait Ambulation/Gait assistance: Min guard Ambulation Distance (Feet): 250 Feet Assistive device: None Gait Pattern/deviations: WFL(Within Functional Limits)   Gait velocity interpretation: at or above  normal speed for age/gender General Gait Details: Patient demonstrates minor episodes of scissoring and mild loss of balance which she was able to recover from. Appropriate speed, able to stop and navigate through room and hallways.   Stairs            Wheelchair Mobility    Modified Rankin (Stroke Patients Only)       Balance Overall balance assessment: Independent                                           Pertinent Vitals/Pain Pain Assessment: No/denies pain    Home Living Family/patient expects to be discharged to:: Private residence Living Arrangements: Children;Other relatives Available Help at Discharge: Family Type of Home: House Home Access: Stairs to enter   Technical brewer of Steps: 3 Home Layout: One level Home Equipment: None      Prior Function Level of Independence: Independent               Hand Dominance        Extremity/Trunk Assessment   Upper Extremity Assessment Upper Extremity Assessment: Overall WFL for tasks assessed    Lower Extremity Assessment Lower Extremity Assessment: Overall WFL for tasks assessed       Communication   Communication: HOH  Cognition Arousal/Alertness: Awake/alert Behavior During Therapy: WFL for tasks assessed/performed Overall Cognitive Status: Within Functional Limits for tasks assessed  General Comments      Exercises     Assessment/Plan    PT Assessment Patient needs continued PT services  PT Problem List Decreased mobility;Decreased activity tolerance;Decreased balance       PT Treatment Interventions Gait training;Stair training;Balance training;Therapeutic exercise;Therapeutic activities;Patient/family education;Neuromuscular re-education    PT Goals (Current goals can be found in the Care Plan section)  Acute Rehab PT Goals Patient Stated Goal: To return home  PT Goal Formulation: With  patient/family Time For Goal Achievement: 12/23/16 Potential to Achieve Goals: Good    Frequency Min 2X/week   Barriers to discharge        Co-evaluation               AM-PAC PT "6 Clicks" Daily Activity  Outcome Measure Difficulty turning over in bed (including adjusting bedclothes, sheets and blankets)?: None Difficulty moving from lying on back to sitting on the side of the bed? : None Difficulty sitting down on and standing up from a chair with arms (e.g., wheelchair, bedside commode, etc,.)?: None Help needed moving to and from a bed to chair (including a wheelchair)?: None Help needed walking in hospital room?: None Help needed climbing 3-5 steps with a railing? : A Little 6 Click Score: 23    End of Session Equipment Utilized During Treatment: Gait belt Activity Tolerance: Patient tolerated treatment well Patient left: in chair;with chair alarm set;with call bell/phone within reach;with family/visitor present Nurse Communication: Mobility status PT Visit Diagnosis: Unsteadiness on feet (R26.81)    Time: 1350-1415 PT Time Calculation (min) (ACUTE ONLY): 25 min   Charges:   PT Evaluation $PT Eval Low Complexity: 1 Procedure     PT G Codes:   PT G-Codes **NOT FOR INPATIENT CLASS** Functional Assessment Tool Used: AM-PAC 6 Clicks Basic Mobility Functional Limitation: Mobility: Walking and moving around Mobility: Walking and Moving Around Current Status (V7793): At least 1 percent but less than 20 percent impaired, limited or restricted Mobility: Walking and Moving Around Goal Status [phone]): At least 1 percent but less than 20 percent impaired, limited or restricted    Royce Macadamia PT, DPT, CSCS    12/09/2016, 2:50 PM

## 2016-12-09 NOTE — Progress Notes (Signed)
[facility] [place] NAME: Kristin Avila    MR#:  [id number]  DATE OF BIRTH:  June 06, 1928  SUBJECTIVE:  CHIEF COMPLAINT:   Chief Complaint  Patient presents with  . Fall  . Head Injury  feels fine now, daughter at bedside  REVIEW OF SYSTEMS:  Review of Systems  Constitutional: Negative for chills, fever and weight loss.  HENT: Positive for hearing loss. Negative for nosebleeds and sore throat.   Eyes: Negative for blurred vision.  Respiratory: Negative for cough, shortness of breath and wheezing.   Cardiovascular: Negative for chest pain, orthopnea, leg swelling and PND.  Gastrointestinal: Negative for abdominal pain, constipation, diarrhea, heartburn, nausea and vomiting.  Genitourinary: Negative for dysuria and urgency.  Musculoskeletal: Negative for back pain.  Skin: Negative for rash.  Neurological: Negative for dizziness, speech change, focal weakness and headaches.  Endo/Heme/Allergies: Does not bruise/bleed easily.  Psychiatric/Behavioral: Negative for depression.    DRUG ALLERGIES:  No Known Allergies VITALS:  Blood pressure 130/60, pulse 71, temperature 98.1 F (36.7 C), temperature source Oral, resp. rate 18, height 5\' 2"  (1.575 m), weight 74.4 kg (164 lb), SpO2 98 %. PHYSICAL EXAMINATION:  Physical Exam  Constitutional: She is oriented to person, place, and time and well-developed, well-nourished, and in no distress.  HENT:  Head: Normocephalic and atraumatic.  Eyes: Conjunctivae and EOM are normal. Pupils are equal, round, and reactive to light.  Neck: Normal range of motion. Neck supple. No tracheal deviation present. No thyromegaly present.  Cardiovascular: Normal rate, regular rhythm and normal heart sounds.   Pulmonary/Chest: Effort normal and breath sounds normal. No respiratory distress. She has no wheezes. She exhibits no tenderness.  Abdominal: Soft. Bowel sounds are normal. She exhibits no distension.  There is no tenderness.  Musculoskeletal: Normal range of motion.  Neurological: She is alert and oriented to person, place, and time. No cranial nerve deficit.  Skin: Skin is warm and dry. No rash noted.  Psychiatric: Mood and affect normal.   LABORATORY PANEL:  Female CBC  Recent Labs Lab 12/08/16 1817  WBC 8.1  HGB 13.5  HCT 40.5  PLT 218   ------------------------------------------------------------------------------------------------------------------ Chemistries   Recent Labs Lab 12/08/16 1817 12/08/16 2145  NA 134*  --   K 3.8  --   CL 102  --   CO2 25  --   GLUCOSE 111*  --   BUN 21*  --   CREATININE 0.76  --   CALCIUM 9.4  --   MG  --  2.0   RADIOLOGY:  Ct Head Wo Contrast  Result Date: 12/08/2016 CLINICAL DATA:  Fall with hematoma on the head.  Initial encounter. EXAM: CT HEAD WITHOUT CONTRAST TECHNIQUE: Contiguous axial images were obtained from the base of the skull through the vertex without intravenous contrast. COMPARISON:  08/20/2012 FINDINGS: Brain: Normal. No evidence of acute or remote infarction, hemorrhage, hydrocephalus, extra-axial collection or mass lesion/mass effect. Vascular: Atherosclerotic calcification.  No hyperdense vessel. Skull: Right parietal scalp hematoma.  Negative for fracture Sinuses/Orbits: No evidence of injury IMPRESSION: 1. No evidence of intracranial injury. 2. Right parietal scalp hematoma without fracture. Electronically Signed   By: Monte Fantasia M.D.   On: 12/08/2016 18:38   US Carotid Bilateral  Result Date: 12/09/2016 CLINICAL DATA:  Syncope. EXAM: BILATERAL CAROTID DUPLEX ULTRASOUND TECHNIQUE: Pearline Cables scale imaging, color Doppler and duplex ultrasound were performed of bilateral carotid and vertebral arteries in the neck. COMPARISON:  08/20/2012 FINDINGS: Criteria: Quantification  of carotid stenosis is based on velocity parameters that correlate the residual internal carotid diameter with NASCET-based stenosis levels, using the  diameter of the distal internal carotid lumen as the denominator for stenosis measurement. The following velocity measurements were obtained: RIGHT ICA:  76 cm/sec CCA:  88 cm/sec SYSTOLIC ICA/CCA RATIO:  0.9 DIASTOLIC ICA/CCA RATIO:  1.5 ECA:  106 cm/sec LEFT ICA:  74 cm/sec CCA:  82 cm/sec SYSTOLIC ICA/CCA RATIO:  0.9 DIASTOLIC ICA/CCA RATIO:  0.8 ECA:  111 cm/sec RIGHT CAROTID ARTERY: Mild disease in the right common carotid artery. External carotid artery is patent with normal waveform. Moderate plaque at the carotid bulb and proximal internal carotid artery. Normal waveforms and velocities in the internal carotid artery. RIGHT VERTEBRAL ARTERY: Antegrade flow and normal waveform in the right vertebral artery. LEFT CAROTID ARTERY: Mild disease in left common carotid artery. Small amount of echogenic plaque at the left carotid bulb. External carotid artery is patent with normal waveform. Normal waveforms and velocities in the internal carotid artery. LEFT VERTEBRAL ARTERY: Antegrade flow and normal waveform in the left vertebral artery. IMPRESSION: Mild to moderate atherosclerotic disease in the carotid arteries, most prominent in the proximal right internal carotid artery. Estimated degree of stenosis in the internal carotid arteries is less than 50% bilaterally. Patent vertebral arteries with antegrade flow. Electronically Signed   By: Markus Daft M.D.   On: 12/09/2016 17:14   ASSESSMENT AND PLAN:   Recurrent Syncope: await echocardiogram and carotid duplex, cardiology consult. - will c/s neuro - obtain EEG, MRI Brain - PT c/S - Check orthostatics  * Hyponatremia and mild  Dehydration.Start normal saline IV and follow-up BMP.  * Hypertension. Continue lisinopril and norvasc. Adjust as need  * History of A. Fib, now sinus rhythm.continued ASA.     All the records are reviewed and case discussed with Care Management/Social Worker. Management plans discussed with the patient, family, Dr Ubaldo Glassing and  they are in agreement.  CODE STATUS: Full Code  TOTAL TIME TAKING CARE OF THIS PATIENT: 25 minutes.   More than 50% of the time was spent in counseling/coordination of care: YES  POSSIBLE D/C IN 1 DAYS, DEPENDING ON CLINICAL CONDITION.   Max Sane M.D on 12/09/2016 at 6:01 PM  Between 7am to 6pm - Pager - [phone]  After 6pm go to [email or web address] - Proofreader  [facility]  Office  [phone]  CC: Primary care physician; Einar Pheasant, MD  Note: This dictation was prepared with Dragon dictation along with smaller phrase technology. Any transcriptional errors that result from this process are unintentional.

## 2016-12-09 NOTE — Consult Note (Signed)
Reason for Consult:Syncope Referring Physician: Manuella Ghazi  CC: Syncope  HPI: Kristin Avila is an 83 y.o. female with a history of atrial fibrillation on ASA who presents after an episode of syncope.  On Saturday the patient was at the refrigerator and passed out and on yesterday she was washing dishes and passed out.  She did not feel any palpitations or lightheadedness. There were no premonitory symptoms.  She did not lose bowel or bladder continence.  She did not bite her tongue.  Neither episode was witnessed. She feels she is back to baseline.   At baseline patient lives with grandson and family.  They help her with doctor visits and at times she has gotten lost driving.  She does take care of her own ADL's, drives and takes care of her own bills.    Past Medical History:  Diagnosis Date  . Anemia   . Atrial fibrillation ([place]) 11/13   new onset 11/13  . Hypercholesterolemia   . Hypertension   . Osteoarthritis    hands, feet    Past Surgical History:  Procedure Laterality Date  . APPENDECTOMY  1957  . CATARACT EXTRACTION  2013   left eye  . KIDNEY SURGERY  age 41  . TUBAL LIGATION      Family History  Problem Relation Age of Onset  . Congestive Heart Failure Father   . Multiple myeloma Mother   . Hypertension Brother   . Alzheimer's disease      grandmother and great aunts  . Breast cancer Neg Hx   . Colon cancer Neg Hx     Social History:  reports that she has never smoked. She has never used smokeless tobacco. She reports that she does not drink alcohol or use drugs.  No Known Allergies  Medications:  I have reviewed the patient's current medications. Prior to Admission:  Prescriptions Prior to Admission  Medication Sig Dispense Refill Last Dose  . amLODipine (NORVASC) 10 MG tablet TAKE ONE TABLET BY MOUTH ONCE DAILY 90 tablet 1 12/08/2016 at am  . aspirin 81 MG tablet Take 81 mg by mouth daily.   12/08/2016 at am  . Cholecalciferol (VITAMIN D-3) 1000 UNITS CAPS Take  1 capsule by mouth daily.    12/08/2016 at am  . FLUoxetine (PROZAC) 40 MG capsule Take 1 capsule (40 mg total) by mouth daily. 30 capsule 6 12/08/2016 at am  . lisinopril (PRINIVIL,ZESTRIL) 40 MG tablet TAKE ONE TABLET BY MOUTH ONCE DAILY 90 tablet 1 12/08/2016 at am  . lovastatin (MEVACOR) 20 MG tablet TAKE ONE TABLET BY MOUTH ONCE DAILY 90 tablet 1 12/08/2016 at am  . naproxen sodium (ANAPROX) 220 MG tablet Take 220 mg by mouth 2 (two) times daily with a meal.   prn at prn  . traZODone (DESYREL) 100 MG tablet Take 1 tablet (100 mg total) by mouth at bedtime. 30 tablet 6 12/07/2016 at qhs  . ciprofloxacin-dexamethasone (CIPRODEX) otic suspension 4 gtt in affected ear(s) BID x 7 days. (Patient not taking: Reported on 12/08/2016) 4 mL 0 Completed Course at Unknown time  . mupirocin ointment (BACTROBAN) 2 % Apply to affected area bid for 5 days (Patient not taking: Reported on 12/08/2016) 22 g 0 Completed Course at Unknown time   Scheduled: . amLODipine  10 mg Oral Daily  . aspirin  81 mg Oral Daily  . enoxaparin (LOVENOX) injection  40 mg Subcutaneous Q24H  . FLUoxetine  40 mg Oral Daily  . lisinopril  40 mg  Oral Daily  . pravastatin  20 mg Oral q1800  . sodium chloride flush  3 mL Intravenous Q12H  . traZODone  100 mg Oral QHS    ROS: History obtained from the patient  General ROS: negative for - chills, fatigue, fever, night sweats, weight gain or weight loss Psychological ROS: negative for - behavioral disorder, hallucinations, memory difficulties, mood swings or suicidal ideation Ophthalmic ROS: negative for - blurry vision, double vision, eye pain or loss of vision ENT ROS: negative for - epistaxis, nasal discharge, oral lesions, sore throat, tinnitus or vertigo Allergy and Immunology ROS: negative for - hives or itchy/watery eyes Hematological and Lymphatic ROS: negative for - bleeding problems, bruising or swollen lymph nodes Endocrine ROS: negative for - galactorrhea, hair pattern changes,  polydipsia/polyuria or temperature intolerance Respiratory ROS: negative for - cough, hemoptysis, shortness of breath or wheezing Cardiovascular ROS: negative for - chest pain, dyspnea on exertion, edema or irregular heartbeat Gastrointestinal ROS: negative for - abdominal pain, diarrhea, hematemesis, nausea/vomiting or stool incontinence Genito-Urinary ROS: negative for - dysuria, hematuria, incontinence or urinary frequency/urgency Musculoskeletal ROS: negative for - joint swelling or muscular weakness Neurological ROS: as noted in HPI Dermatological ROS: negative for rash and skin lesion changes  Physical Examination: Blood pressure 130/60, pulse 71, temperature 98.1 F (36.7 C), temperature source Oral, resp. rate 18, height 5' 2"  (1.575 m), weight 74.4 kg (164 lb), SpO2 98 %.  HEENT-  Normocephalic, no lesions, without obvious abnormality.  Normal external eye and conjunctiva.  Normal TM's bilaterally.  Normal auditory canals and external ears. Normal external nose, mucus membranes and septum.  Normal pharynx. Cardiovascular- S1, S2 normal, pulses palpable throughout   Lungs- chest clear, no wheezing, rales, normal symmetric air entry Abdomen- soft, non-tender; bowel sounds normal; no masses,  no organomegaly Extremities- LE edema Lymph-no adenopathy palpable Musculoskeletal-no joint tenderness, deformity or swelling Skin-bruising noted  Neurological Examination   Mental Status: Alert, oriented, thought content appropriate.  Speech fluent without evidence of aphasia.  Able to follow 3 step commands without difficulty. Cranial Nerves: II: Discs flat bilaterally; Visual fields grossly normal, pupils equal, round, reactive to light and accommodation III,IV, VI: ptosis not present, extra-ocular motions intact bilaterally V,VII: smile symmetric, facial light touch sensation normal bilaterally VIII: hearing normal bilaterally IX,X: gag reflex present XI: bilateral shoulder shrug XII:  midline tongue extension Motor: Right : Upper extremity   5/5    Left:     Upper extremity   5/5  Lower extremity   5/5     Lower extremity   5/5 Tone and bulk:normal tone throughout; no atrophy noted Sensory: Pinprick and light touch intact throughout, bilaterally Deep Tendon Reflexes: 2+ and symmetric throughout Plantars: Right: mute   Left: mute Cerebellar: Normal finger-to-nose and normal heel-to-shin testing bilaterally Gait: normal gait and station    Laboratory Studies:   Basic Metabolic Panel:  Recent Labs Lab 12/08/16 1817 12/08/16 2145  NA 134*  --   K 3.8  --   CL 102  --   CO2 25  --   GLUCOSE 111*  --   BUN 21*  --   CREATININE 0.76  --   CALCIUM 9.4  --   MG  --  2.0    Liver Function Tests: No results for input(s): AST, ALT, ALKPHOS, BILITOT, PROT, ALBUMIN in the last 168 hours. No results for input(s): LIPASE, AMYLASE in the last 168 hours. No results for input(s): AMMONIA in the last 168 hours.  CBC:  Recent Labs Lab 12/08/16 1817  WBC 8.1  HGB 13.5  HCT 40.5  MCV 85.7  PLT 218    Cardiac Enzymes:  Recent Labs Lab 12/08/16 1817 12/08/16 2145  TROPONINI 0.03* 0.03*    BNP: Invalid input(s): POCBNP  CBG: No results for input(s): GLUCAP in the last 168 hours.  Microbiology: Results for orders placed or performed in visit on 08/20/12  Urine culture     Status: None   Collection Time: 08/20/12  2:42 PM  Result Value Ref Range Status   Micro Text Report   Final       SOURCE: CLEAN CATCH    COMMENT                   NO GROWTH IN 36 HOURS   ANTIBIOTIC                                                        Coagulation Studies: No results for input(s): LABPROT, INR in the last 72 hours.  Urinalysis:  Recent Labs Lab 12/08/16 1857  COLORURINE COLORLESS*  LABSPEC 1.002*  PHURINE 6.0  GLUCOSEU NEGATIVE  HGBUR NEGATIVE  BILIRUBINUR NEGATIVE  KETONESUR NEGATIVE  PROTEINUR NEGATIVE  NITRITE NEGATIVE  LEUKOCYTESUR  NEGATIVE    Lipid Panel:     Component Value Date/Time   CHOL 219 (H) 11/26/2016 1011   CHOL 185 08/21/2012 0236   TRIG 68.0 11/26/2016 1011   TRIG 34 08/21/2012 0236   HDL 91.90 11/26/2016 1011   HDL 91 (H) 08/21/2012 0236   CHOLHDL 2 11/26/2016 1011   VLDL 13.6 11/26/2016 1011   VLDL 7 08/21/2012 0236   LDLCALC 113 (H) 11/26/2016 1011   LDLCALC 87 08/21/2012 0236    HgbA1C: No results found for: HGBA1C  Urine Drug Screen:  No results found for: LABOPIA, COCAINSCRNUR, LABBENZ, AMPHETMU, THCU, LABBARB  Alcohol Level: No results for input(s): ETH in the last 168 hours.  Other results: EKG: sinus rhythm at 73 bpm.  Imaging: Ct Head Wo Contrast  Result Date: 12/08/2016 CLINICAL DATA:  Fall with hematoma on the head.  Initial encounter. EXAM: CT HEAD WITHOUT CONTRAST TECHNIQUE: Contiguous axial images were obtained from the base of the skull through the vertex without intravenous contrast. COMPARISON:  08/20/2012 FINDINGS: Brain: Normal. No evidence of acute or remote infarction, hemorrhage, hydrocephalus, extra-axial collection or mass lesion/mass effect. Vascular: Atherosclerotic calcification.  No hyperdense vessel. Skull: Right parietal scalp hematoma.  Negative for fracture Sinuses/Orbits: No evidence of injury IMPRESSION: 1. No evidence of intracranial injury. 2. Right parietal scalp hematoma without fracture. Electronically Signed   By: Monte Fantasia M.D.   On: 12/08/2016 18:38     Assessment/Plan: 83 year old female presenting after a recurrent syncopal episode.  Patient with a history of atrial fibrillation on ASA. Has no warning signs.  Is back to baseline.  Unclear etiology.  Further work up recommended.    Recommendations: 1.  Orthostatic vitals 2.  EEG 3.  MRI of the brain without contrast 4.  Telemetry    Alexis Goodell, MD Neurology [phone] 12/09/2016, 2:41 PM

## 2016-12-09 NOTE — Care Management Obs Status (Signed)
[facility] NOTIFICATION   Patient Details  Name: AKEELA BUSK MRN: [id number] Date of Birth: 1927-10-17   Medicare Observation Status Notification Given:  Yes    Katrina Stack, RN 12/09/2016, 2:49 PM

## 2016-12-09 NOTE — Consult Note (Signed)
[facility]  CARDIOLOGY CONSULT NOTE  Patient ID: Kristin Avila MRN: [id number] DOB/AGE: 1927/11/15 83 y.o.  Admit date: 12/08/2016 Referring Physician Dr. Manuella Ghazi Primary Physician   Primary Cardiologist Dr. Nehemiah Massed Reason for Consultation syncope  HPI: Pt is an 83 yo female with history of hypertension treated with amlodipine 10 mg daily, hctz 12.5 mg daily, lisinopril 40 mg daily, history of hyperlipidemia treated with lovastatin 20 mg daily and paroxysmal afib who was admitted with an apparent syncopal episode. Work up in the past has shown preserved lv function and no ischemia on functional study. She has mod mr. Pt apparently had no preceding syptoms. She had a similar episode several days earlier. Head ct showed scalp hematoma but no cva. EKG showed nsr with no ischemia or dysrhythmia. There have been no arrhythmia noted on telemetry. She has ruled out for an mi. Renal function is normal. The syncopal episodes were unwitnessed.  She is usually standing at the time she has the events. She denies chest pain. She has ruled out for an mi.   ROS Review of Systems - History obtained from the patient and daughter General ROS: positive for  - syncope Respiratory ROS: no cough, shortness of breath, or wheezing Cardiovascular ROS: no chest pain or dyspnea on exertion Neurological ROS: positive for - weakness and syncope negative for - seizures   Past Medical History:  Diagnosis Date  . Anemia   . Atrial fibrillation ([place]) 11/13   new onset 11/13  . Hypercholesterolemia   . Hypertension   . Osteoarthritis    hands, feet    Family History  Problem Relation Age of Onset  . Congestive Heart Failure Father   . Multiple myeloma Mother   . Hypertension Brother   . Alzheimer's disease      grandmother and great aunts  . Breast cancer Neg Hx   . Colon cancer Neg Hx     Social History   Social History  . Marital status: Widowed   Spouse name: N/A  . Number of children: 4  . Years of education: N/A   Occupational History  . Not on file.   Social History Main Topics  . Smoking status: Never Smoker  . Smokeless tobacco: Never Used  . Alcohol use No     Comment: occasional  . Drug use: No  . Sexual activity: Not on file   Other Topics Concern  . Not on file   Social History Narrative  . No narrative on file    Past Surgical History:  Procedure Laterality Date  . APPENDECTOMY  1957  . CATARACT EXTRACTION  2013   left eye  . KIDNEY SURGERY  age 60  . TUBAL LIGATION       Prescriptions Prior to Admission  Medication Sig Dispense Refill Last Dose  . amLODipine (NORVASC) 10 MG tablet TAKE ONE TABLET BY MOUTH ONCE DAILY 90 tablet 1 12/08/2016 at am  . aspirin 81 MG tablet Take 81 mg by mouth daily.   12/08/2016 at am  . Cholecalciferol (VITAMIN D-3) 1000 UNITS CAPS Take 1 capsule by mouth daily.    12/08/2016 at am  . FLUoxetine (PROZAC) 40 MG capsule Take 1 capsule (40 mg total) by mouth daily. 30 capsule 6 12/08/2016 at am  . lisinopril (PRINIVIL,ZESTRIL) 40 MG tablet TAKE ONE TABLET BY MOUTH ONCE DAILY 90 tablet 1 12/08/2016 at am  . lovastatin (MEVACOR) 20 MG tablet TAKE ONE TABLET BY  MOUTH ONCE DAILY 90 tablet 1 12/08/2016 at am  . naproxen sodium (ANAPROX) 220 MG tablet Take 220 mg by mouth 2 (two) times daily with a meal.   prn at prn  . traZODone (DESYREL) 100 MG tablet Take 1 tablet (100 mg total) by mouth at bedtime. 30 tablet 6 12/07/2016 at qhs  . ciprofloxacin-dexamethasone (CIPRODEX) otic suspension 4 gtt in affected ear(s) BID x 7 days. (Patient not taking: Reported on 12/08/2016) 4 mL 0 Completed Course at Unknown time  . mupirocin ointment (BACTROBAN) 2 % Apply to affected area bid for 5 days (Patient not taking: Reported on 12/08/2016) 22 g 0 Completed Course at Unknown time    Physical Exam: Blood pressure 130/60, pulse 71, temperature 98.1 F (36.7 C), temperature source Oral, resp. rate 18, height 5'  2" (1.575 m), weight 74.4 kg (164 lb), SpO2 98 %.   Wt Readings from Last 1 Encounters:  12/08/16 74.4 kg (164 lb)     General appearance: alert and cooperative Head: Normocephalic, without obvious abnormality, atraumatic Resp: clear to auscultation bilaterally Cardio: regular rate and rhythm GI: soft, non-tender; bowel sounds normal; no masses,  no organomegaly Extremities: extremities normal, atraumatic, no cyanosis or edema Pulses: 2+ and symmetric Neurologic: Grossly normal  Labs:   Lab Results  Component Value Date   WBC 8.1 12/08/2016   HGB 13.5 12/08/2016   HCT 40.5 12/08/2016   MCV 85.7 12/08/2016   PLT 218 12/08/2016    Recent Labs Lab 12/08/16 1817  NA 134*  K 3.8  CL 102  CO2 25  BUN 21*  CREATININE 0.76  CALCIUM 9.4  GLUCOSE 111*   Lab Results  Component Value Date   CKTOTAL 83 08/21/2012   CKMB 0.8 08/21/2012   TROPONINI 0.03 (HH) 12/08/2016     EKG: nsr  ASSESSMENT AND PLAN:  84 yo female with history of several falls recently felt to be syncopal. These events havew been unwitnessed thusfar. Had lac on scalp during most recent fall. Has no apparent warning. Brain ct unremarkable. NO dysrhythmia noted. WIll review echo when available, review carotid when available. Orthostatics. Hydrate. Out patient holter.  Signed: Teodoro Spray MD, [facility] 12/09/2016, 1:14 PM

## 2016-12-09 NOTE — Progress Notes (Signed)
Patient was transferred from the ER following a syncope episode at home. On admission patient was accompanied by her daughter, patient was ambulatory, A&O X4 and denied being in any pain. Patient and family were updated about her care plan and safety precautions. Patient and family were oriented to her room, bed alarm activated, needed items placed within patient's reach.

## 2016-12-10 ENCOUNTER — Observation Stay: Payer: Medicare Other

## 2016-12-10 ENCOUNTER — Encounter: Payer: Self-pay | Admitting: Pharmacist

## 2016-12-10 LAB — ECHOCARDIOGRAM COMPLETE
HEIGHTINCHES: 62 in
WEIGHTICAEL: 2624 [oz_av]

## 2016-12-10 MED ORDER — SODIUM CHLORIDE 0.9 % IV SOLN
INTRAVENOUS | Status: AC
  Administered 2016-12-10 – 2016-12-11 (×2): via INTRAVENOUS

## 2016-12-10 NOTE — Progress Notes (Signed)
Patient was with her daughter and they were taking a nap. The patient's daughter said that they were waiting on test results and they were hopefull. Since they were taking a nap Chaplain did not stay long. Patient and her daughter appreciated the Chaplain's visit.   12/10/16 1400  Clinical Encounter Type  Visited With Patient and family together  Visit Type Initial  Spiritual Encounters  Spiritual Needs Other (Comment)

## 2016-12-10 NOTE — Progress Notes (Signed)
       [facility] [place] [facility]  SUBJECTIVE: no further syncope   Vitals:   12/09/16 1234 12/09/16 2122 12/10/16 0329 12/10/16 1356  BP: 130/60 (!) 145/70 (!) 153/54 (!) 118/54  Pulse: 71 74 66 67  Resp:  18 18 18   Temp:  97.6 F (36.4 C) 98.4 F (36.9 C) 98 F (36.7 C)  TempSrc:  Oral Oral Oral  SpO2:  96% 96% 95%  Weight:      Height:        Intake/Output Summary (Last 24 hours) at 12/10/16 1539 Last data filed at 12/10/16 1422  Gross per 24 hour  Intake             1185 ml  Output              950 ml  Net              235 ml    LABS: Basic Metabolic Panel:  Recent Labs  12/08/16 1817 12/08/16 2145  NA 134*  --   K 3.8  --   CL 102  --   CO2 25  --   GLUCOSE 111*  --   BUN 21*  --   CREATININE 0.76  --   CALCIUM 9.4  --   MG  --  2.0   Liver Function Tests: No results for input(s): AST, ALT, ALKPHOS, BILITOT, PROT, ALBUMIN in the last 72 hours. No results for input(s): LIPASE, AMYLASE in the last 72 hours. CBC:  Recent Labs  12/08/16 1817  WBC 8.1  HGB 13.5  HCT 40.5  MCV 85.7  PLT 218   Cardiac Enzymes:  Recent Labs  12/08/16 1817 12/08/16 2145  TROPONINI 0.03* 0.03*   BNP: Invalid input(s): POCBNP D-Dimer: No results for input(s): DDIMER in the last 72 hours. Hemoglobin A1C: No results for input(s): HGBA1C in the last 72 hours. Fasting Lipid Panel: No results for input(s): CHOL, HDL, LDLCALC, TRIG, CHOLHDL, LDLDIRECT in the last 72 hours. Thyroid Function Tests: No results for input(s): TSH, T4TOTAL, T3FREE, THYROIDAB in the last 72 hours.  Invalid input(s): FREET3 Anemia Panel: No results for input(s): VITAMINB12, FOLATE, FERRITIN, TIBC, IRON, RETICCTPCT in the last 72 hours.   Physical Exam: Blood pressure (!) 118/54, pulse 67, temperature 98 F (36.7 C), temperature source Oral, resp. rate 18, height 5\' 2"  (1.575 m), weight 74.4 kg (164 lb), SpO2 95 %.   Wt Readings from Last 1 Encounters:    12/08/16 74.4 kg (164 lb)     General appearance: alert and cooperative Resp: clear to auscultation bilaterally Cardio: regular rate and rhythm GI: soft, non-tender; bowel sounds normal; no masses,  no organomegaly Extremities: extremities normal, atraumatic, no cyanosis or edema Neurologic: Grossly normal  TELEMETRY: Reviewed telemetry pt in nsr:  ASSESSMENT AND PLAN:  Active Problems:   Syncope-no further syncope. Echo showed no significant abnormalities. Carotids without significant disease. Brain MRI looked fine. No further cardiac workup in patient. Would place 48 hour holter at discharge. OK for discharge form cardiac standpoint.     Teodoro Spray, MD, [facility] 12/10/2016 3:39 PM

## 2016-12-10 NOTE — Plan of Care (Signed)
Problem: Safety: Goal: Ability to remain free from injury will improve Outcome: Progressing Pt high fall risk / bed/ chair alarm in use/ yellow socks in use/ call bell and phone in reach/  encouraged to ask for assistance before getting out of bed/ chair/ frequent rounding

## 2016-12-10 NOTE — Progress Notes (Addendum)
[place] at [place] NAME: Kristin Avila    MR#:  [id number]  DATE OF BIRTH:  1928/07/17  SUBJECTIVE:  CHIEF COMPLAINT:   Chief Complaint  Patient presents with  . Fall  . Head Injury     patient reports synopal episode without any lightheadedness , daughter at bedside  REVIEW OF SYSTEMS:  Review of Systems  Constitutional: Negative for chills, fever and weight loss.  HENT: Positive for hearing loss. Negative for nosebleeds and sore throat.   Eyes: Negative for blurred vision.  Respiratory: Negative for cough, shortness of breath and wheezing.   Cardiovascular: Negative for chest pain, orthopnea, leg swelling and PND.  Gastrointestinal: Negative for abdominal pain, constipation, diarrhea, heartburn, nausea and vomiting.  Genitourinary: Negative for dysuria and urgency.  Musculoskeletal: Negative for back pain.  Skin: Negative for rash.  Neurological: Negative for dizziness, speech change, focal weakness and headaches.  Endo/Heme/Allergies: Does not bruise/bleed easily.  Psychiatric/Behavioral: Negative for depression.    DRUG ALLERGIES:  No Known Allergies VITALS:  Blood pressure (!) 118/54, pulse 67, temperature 98 F (36.7 C), temperature source Oral, resp. rate 18, height 5\' 2"  (1.575 m), weight 74.4 kg (164 lb), SpO2 95 %. PHYSICAL EXAMINATION:  Physical Exam  Constitutional: She is oriented to person, place, and time and well-developed, well-nourished, and in no distress.  HENT:  Head: Normocephalic and atraumatic.  Eyes: Conjunctivae and EOM are normal. Pupils are equal, round, and reactive to light.  Neck: Normal range of motion. Neck supple. No tracheal deviation present. No thyromegaly present.  Cardiovascular: Normal rate, regular rhythm and normal heart sounds.   Pulmonary/Chest: Effort normal and breath sounds normal. No respiratory distress. She has no wheezes. She exhibits no tenderness.  Abdominal: Soft. Bowel  sounds are normal. She exhibits no distension. There is no tenderness.  Musculoskeletal: Normal range of motion.  Neurological: She is alert and oriented to person, place, and time. No cranial nerve deficit.  Skin: Skin is warm and dry. No rash noted.  Psychiatric: Mood and affect normal.   LABORATORY PANEL:  Female CBC  Recent Labs Lab 12/08/16 1817  WBC 8.1  HGB 13.5  HCT 40.5  PLT 218   ------------------------------------------------------------------------------------------------------------------ Chemistries   Recent Labs Lab 12/08/16 1817 12/08/16 2145  NA 134*  --   K 3.8  --   CL 102  --   CO2 25  --   GLUCOSE 111*  --   BUN 21*  --   CREATININE 0.76  --   CALCIUM 9.4  --   MG  --  2.0   RADIOLOGY:  Mr Brain Wo Contrast  Result Date: 12/10/2016 CLINICAL DATA:  Syncope EXAM: MRI HEAD WITHOUT CONTRAST TECHNIQUE: Multiplanar, multiecho pulse sequences of the brain and surrounding structures were obtained without intravenous contrast. COMPARISON:  None. FINDINGS: Brain: No acute infarction, hemorrhage, hydrocephalus, extra-axial collection or mass lesion. Normal brain volume for age. Few FLAIR hyperintensities in the cerebral white matter, expected. Two possible remote micro hemorrhages in the right inferior cerebrum, nonspecific and consider incidental in isolation. Vascular: Major flow voids are preserved, including in the vertebrobasilar circulation. Skull and upper cervical spine: Mild facet spurring. No evidence of marrow lesion or injury. Sinuses/Orbits: Bilateral cataract resection.  No acute finding IMPRESSION: Unremarkable brain MRI for age. Electronically Signed   By: Monte Fantasia M.D.   On: 12/10/2016 13:45   US Carotid Bilateral  Result Date: 12/09/2016 CLINICAL DATA:  Syncope. EXAM: BILATERAL CAROTID  DUPLEX ULTRASOUND TECHNIQUE: Pearline Cables scale imaging, color Doppler and duplex ultrasound were performed of bilateral carotid and vertebral arteries in the neck.  COMPARISON:  08/20/2012 FINDINGS: Criteria: Quantification of carotid stenosis is based on velocity parameters that correlate the residual internal carotid diameter with NASCET-based stenosis levels, using the diameter of the distal internal carotid lumen as the denominator for stenosis measurement. The following velocity measurements were obtained: RIGHT ICA:  76 cm/sec CCA:  88 cm/sec SYSTOLIC ICA/CCA RATIO:  0.9 DIASTOLIC ICA/CCA RATIO:  1.5 ECA:  106 cm/sec LEFT ICA:  74 cm/sec CCA:  82 cm/sec SYSTOLIC ICA/CCA RATIO:  0.9 DIASTOLIC ICA/CCA RATIO:  0.8 ECA:  111 cm/sec RIGHT CAROTID ARTERY: Mild disease in the right common carotid artery. External carotid artery is patent with normal waveform. Moderate plaque at the carotid bulb and proximal internal carotid artery. Normal waveforms and velocities in the internal carotid artery. RIGHT VERTEBRAL ARTERY: Antegrade flow and normal waveform in the right vertebral artery. LEFT CAROTID ARTERY: Mild disease in left common carotid artery. Small amount of echogenic plaque at the left carotid bulb. External carotid artery is patent with normal waveform. Normal waveforms and velocities in the internal carotid artery. LEFT VERTEBRAL ARTERY: Antegrade flow and normal waveform in the left vertebral artery. IMPRESSION: Mild to moderate atherosclerotic disease in the carotid arteries, most prominent in the proximal right internal carotid artery. Estimated degree of stenosis in the internal carotid arteries is less than 50% bilaterally. Patent vertebral arteries with antegrade flow. Electronically Signed   By: Markus Daft M.D.   On: 12/09/2016 17:14   ASSESSMENT AND PLAN:   Recurrent Syncope:   Paient is significantly orthostastic. Continue  IV fluids. Echocardiogram with normal ejection fraction 60-65%. Carotid Dopplers with less than 50% ICA stenosis. Acute MI ruled out with negative troponins. Telemetry with a 3.2 second pauses. Follow-up with cardiology and avoid rate  limiting drugs -EEG normal MRI of the brain is normal. Patient is fine from neurology standpoint -LDL at 113, with 10 year cardiac risk greater than 10. Patient is started on statin -Follow up with cardiology.Recommended outpatient Holter monitoring -PT  no PT needs identified  * Hyponatremia and mild  Dehydration. normal saline IV and follow-up BMP sodium 1:34 repeat in a.m.  * Hypertension. Continue lisinopril and norvasc. Adjust as need  * History of A. Fib, now sinus rhythm.continued ASA.     All the records are reviewed and case discussed with Care Management/Social Worker. Management plans discussed with the patient, family, Dr Ubaldo Glassing and they are in agreement.  CODE STATUS: Full Code  TOTAL TIME TAKING CARE OF THIS PATIENT: 35 minutes.   More than 50% of the time was spent in counseling/coordination of care: YES  POSSIBLE D/C IN 1 DAYS, DEPENDING ON CLINICAL CONDITION.   Nicholes Mango M.D on 12/10/2016 at 2:01 PM  Between 7am to 6pm - Pager - [phone]   After 6pm go to [email or web address] - Proofreader  [employer] [facility]  Office  [phone]  CC: Primary care physician; Einar Pheasant, MD  Note: This dictation was prepared with Dragon dictation along with smaller phrase technology. Any transcriptional errors that result from this process are unintentional.

## 2016-12-10 NOTE — Progress Notes (Signed)
Physical Therapy Treatment Patient Details Name: Kristin Avila MRN: [id number] DOB: 06-04-1928 Today's Date: 12/10/2016    History of Present Illness Patient is an 83 y/o female that presents after multiple syncopal episodes. She has a history of A-fib, CT scan was negative. She was in the kitchen during her episodes, does not recall what activities she was doing prior.     PT Comments    Pt in chair with daughter, ready to participate.  Orthostatic BP's taken and discussed with primary RN. Sitting - feet down upon arrival 128/65 P 76 Standing                                       89/47 P 80 Standing at 3 min                        100/58 P 83 After gait                                       146/60  P 79  Pt with varied BP's this session.  Pt denies dizziness and wanted to ambulate.  Wheelchair follow was used for safety.  She was able to ambulate around unit x 1 without assistive device.  Initially gait generally steady but as gait progressed quality decreased and her frequency of reaching for handrails/wall increased.  While she was able to correct imbalances without assist, assistive device was discussed and encouraged pt to consider.  She was resistant to walker at this time.  Encouraged her to consider assistive device especially for walking in open areas or out in the community.  She remained resistant.     Follow Up Recommendations  No PT follow up     Equipment Recommendations       Recommendations for Other Services       Precautions / Restrictions Precautions Precautions: Fall Precaution Comments: BP Restrictions Weight Bearing Restrictions: No    Mobility  Bed Mobility               General bed mobility comments: Patient seated in bedside recliner.   Transfers Overall transfer level: Modified independent               General transfer comment: Patient requires mild use of UEs, no loss of balance noted.   Ambulation/Gait Ambulation/Gait  assistance: Min guard Ambulation Distance (Feet): 250 Feet Assistive device: None Gait Pattern/deviations: WFL(Within Functional Limits)     General Gait Details: minor LOB's which she was able to recover without assist, frequently touches wall/handrails for balance.  Is resistant to AD. Qaulity decreases with fatigue.   Stairs            Wheelchair Mobility    Modified Rankin (Stroke Patients Only)       Balance Overall balance assessment: Needs assistance Sitting-balance support: Feet supported Sitting balance-Leahy Scale: Normal     Standing balance support: No upper extremity supported Standing balance-Leahy Scale: Good Standing balance comment: decreased dyanamic balance with fatigue                            Cognition Arousal/Alertness: Awake/alert Behavior During Therapy: WFL for tasks assessed/performed Overall Cognitive Status: Within Functional Limits for tasks assessed  Exercises      General Comments        Pertinent Vitals/Pain Pain Assessment: No/denies pain    Home Living                      Prior Function            PT Goals (current goals can now be found in the care plan section) Progress towards PT goals: Progressing toward goals    Frequency    Min 2X/week      PT Plan      Co-evaluation              AM-PAC PT "6 Clicks" Daily Activity  Outcome Measure  Difficulty turning over in bed (including adjusting bedclothes, sheets and blankets)?: None Difficulty moving from lying on back to sitting on the side of the bed? : None Difficulty sitting down on and standing up from a chair with arms (e.g., wheelchair, bedside commode, etc,.)?: None Help needed moving to and from a bed to chair (including a wheelchair)?: None Help needed walking in hospital room?: A Little Help needed climbing 3-5 steps with a railing? : A Little 6 Click Score: 22     End of Session Equipment Utilized During Treatment: Gait belt Activity Tolerance: Patient tolerated treatment well Patient left: in chair;with chair alarm set;with call bell/phone within reach;with family/visitor present Nurse Communication: Other (comment)       Time: 5670-1410 PT Time Calculation (min) (ACUTE ONLY): 15 min  Charges:  $Gait Training: 8-22 mins                    G Codes:       Chesley Noon, PTA 12/10/16, 10:25 AM

## 2016-12-10 NOTE — Progress Notes (Addendum)
Per Dr. Ubaldo Glassing, ok to discharge from cardiac perspective.  Was told to hold IV fluids.  Dr. Margaretmary Eddy paged to discuss discharge.    Patient alert and oriented, vss, no complaints of pain.  NSR on monitor.    Addendum: Dr. Margaretmary Eddy does not want to discharge today.  Was told to restart fluids and will possibly discharge tomorrow if orthostatics are better.   Report given to Janett Billow, [place]

## 2016-12-11 ENCOUNTER — Ambulatory Visit: Payer: Medicare Other

## 2016-12-11 ENCOUNTER — Telehealth: Payer: Self-pay | Admitting: Internal Medicine

## 2016-12-11 DIAGNOSIS — R55 Syncope and collapse: Secondary | ICD-10-CM | POA: Diagnosis not present

## 2016-12-11 LAB — BASIC METABOLIC PANEL
ANION GAP: 6 (ref 5–15)
BUN: 17 mg/dL (ref 6–20)
CALCIUM: 8.8 mg/dL — AB (ref 8.9–10.3)
CO2: 24 mmol/L (ref 22–32)
Chloride: 107 mmol/L (ref 101–111)
Creatinine, Ser: 0.69 mg/dL (ref 0.44–1.00)
GLUCOSE: 76 mg/dL (ref 65–99)
POTASSIUM: 3.9 mmol/L (ref 3.5–5.1)
Sodium: 137 mmol/L (ref 135–145)

## 2016-12-11 LAB — CBC
HEMATOCRIT: 40.2 % (ref 35.0–47.0)
HEMOGLOBIN: 13.8 g/dL (ref 12.0–16.0)
MCH: 29.5 pg (ref 26.0–34.0)
MCHC: 34.3 g/dL (ref 32.0–36.0)
MCV: 86.2 fL (ref 80.0–100.0)
Platelets: 208 10*3/uL (ref 150–440)
RBC: 4.66 MIL/uL (ref 3.80–5.20)
RDW: 14.7 % — ABNORMAL HIGH (ref 11.5–14.5)
WBC: 5.9 10*3/uL (ref 3.6–11.0)

## 2016-12-11 MED ORDER — HYDROCODONE-ACETAMINOPHEN 5-325 MG PO TABS: 1.0000 | ORAL_TABLET | Freq: Four times a day (QID) | ORAL | 0 refills | Status: DC | PRN

## 2016-12-11 MED ORDER — SENNOSIDES-DOCUSATE SODIUM 8.6-50 MG PO TABS: 1.0000 | ORAL_TABLET | Freq: Every evening | ORAL | Status: AC | PRN

## 2016-12-11 MED ORDER — ACETAMINOPHEN 325 MG PO TABS: 650.0000 mg | ORAL_TABLET | Freq: Four times a day (QID) | ORAL | Status: AC | PRN

## 2016-12-11 MED ORDER — AMLODIPINE BESYLATE 10 MG PO TABS: 10.0000 mg | ORAL_TABLET | Freq: Every day | ORAL | 0 refills | Status: DC

## 2016-12-11 NOTE — Progress Notes (Signed)
Subjective: Patient has had no further syncopal episodes.    Objective: Current vital signs: BP (!) 144/64 (BP Location: Right Arm)   Pulse 72   Temp 97.7 F (36.5 C) (Oral)   Resp 18   Ht 5\' 2"  (1.575 m)   Wt 74.4 kg (164 lb)   SpO2 96%   BMI 30.00 kg/m  Vital signs in last 24 hours: Temp:  [97.7 F (36.5 C)-98 F (36.7 C)] 97.7 F (36.5 C) (05/04 0936) Pulse Rate:  [62-79] 72 (05/04 0939) Resp:  [16-18] 18 (05/04 0936) BP: (84-160)/(53-79) 144/64 (05/04 0939) SpO2:  [93 %-97 %] 96 % (05/04 0936)  Intake/Output from previous day: 05/03 0701 - 05/04 0700 In: 1455 [P.O.:1080; I.V.:375] Out: 2400 [Urine:2400] Intake/Output this shift: No intake/output data recorded. Nutritional status: Diet Heart Room service appropriate? Yes; Fluid consistency: Thin  Neurologic Exam: Mental Status: Alert, oriented, thought content appropriate.  Speech fluent without evidence of aphasia.  Able to follow 3 step commands without difficulty. Cranial Nerves: II: Discs flat bilaterally; Visual fields grossly normal, pupils equal, round, reactive to light and accommodation III,IV, VI: ptosis not present, extra-ocular motions intact bilaterally V,VII: smile symmetric, facial light touch sensation normal bilaterally VIII: hearing normal bilaterally IX,X: gag reflex present XI: bilateral shoulder shrug XII: midline tongue extension Motor: Right :  Upper extremity   5/5                                      Left:     Upper extremity   5/5             Lower extremity   5/5                                                  Lower extremity   5/5 Tone and bulk:normal tone throughout; no atrophy noted Sensory: Pinprick and light touch intact throughout, bilaterally Deep Tendon Reflexes: 2+ and symmetric throughout Plantars: Right: mute                              Left: mute Cerebellar: Normal finger-to-nose and normal heel-to-shin testing bilaterally Gait: normal gait and station  Lab  Results: Basic Metabolic Panel:  Recent Labs Lab 12/08/16 1817 12/08/16 2145 12/11/16 0625  NA 134*  --  137  K 3.8  --  3.9  CL 102  --  107  CO2 25  --  24  GLUCOSE 111*  --  76  BUN 21*  --  17  CREATININE 0.76  --  0.69  CALCIUM 9.4  --  8.8*  MG  --  2.0  --     Liver Function Tests: No results for input(s): AST, ALT, ALKPHOS, BILITOT, PROT, ALBUMIN in the last 168 hours. No results for input(s): LIPASE, AMYLASE in the last 168 hours. No results for input(s): AMMONIA in the last 168 hours.  CBC:  Recent Labs Lab 12/08/16 1817 12/11/16 0625  WBC 8.1 5.9  HGB 13.5 13.8  HCT 40.5 40.2  MCV 85.7 86.2  PLT 218 208    Cardiac Enzymes:  Recent Labs Lab 12/08/16 1817 12/08/16 2145  TROPONINI 0.03* 0.03*    Lipid Panel: No results for input(s):  CHOL, TRIG, HDL, CHOLHDL, VLDL, LDLCALC in the last 168 hours.  CBG: No results for input(s): GLUCAP in the last 168 hours.  Microbiology: Results for orders placed or performed in visit on 08/20/12  Urine culture     Status: None   Collection Time: 08/20/12  2:42 PM  Result Value Ref Range Status   Micro Text Report   Final       SOURCE: CLEAN CATCH    COMMENT                   NO GROWTH IN 36 HOURS   ANTIBIOTIC                                                        Coagulation Studies: No results for input(s): LABPROT, INR in the last 72 hours.  Imaging: Mr Brain Wo Contrast  Result Date: 12/10/2016 CLINICAL DATA:  Syncope EXAM: MRI HEAD WITHOUT CONTRAST TECHNIQUE: Multiplanar, multiecho pulse sequences of the brain and surrounding structures were obtained without intravenous contrast. COMPARISON:  None. FINDINGS: Brain: No acute infarction, hemorrhage, hydrocephalus, extra-axial collection or mass lesion. Normal brain volume for age. Few FLAIR hyperintensities in the cerebral white matter, expected. Two possible remote micro hemorrhages in the right inferior cerebrum, nonspecific and consider incidental  in isolation. Vascular: Major flow voids are preserved, including in the vertebrobasilar circulation. Skull and upper cervical spine: Mild facet spurring. No evidence of marrow lesion or injury. Sinuses/Orbits: Bilateral cataract resection.  No acute finding IMPRESSION: Unremarkable brain MRI for age. Electronically Signed   By: Monte Fantasia M.D.   On: 12/10/2016 13:45   US Carotid Bilateral  Result Date: 12/09/2016 CLINICAL DATA:  Syncope. EXAM: BILATERAL CAROTID DUPLEX ULTRASOUND TECHNIQUE: Pearline Cables scale imaging, color Doppler and duplex ultrasound were performed of bilateral carotid and vertebral arteries in the neck. COMPARISON:  08/20/2012 FINDINGS: Criteria: Quantification of carotid stenosis is based on velocity parameters that correlate the residual internal carotid diameter with NASCET-based stenosis levels, using the diameter of the distal internal carotid lumen as the denominator for stenosis measurement. The following velocity measurements were obtained: RIGHT ICA:  76 cm/sec CCA:  88 cm/sec SYSTOLIC ICA/CCA RATIO:  0.9 DIASTOLIC ICA/CCA RATIO:  1.5 ECA:  106 cm/sec LEFT ICA:  74 cm/sec CCA:  82 cm/sec SYSTOLIC ICA/CCA RATIO:  0.9 DIASTOLIC ICA/CCA RATIO:  0.8 ECA:  111 cm/sec RIGHT CAROTID ARTERY: Mild disease in the right common carotid artery. External carotid artery is patent with normal waveform. Moderate plaque at the carotid bulb and proximal internal carotid artery. Normal waveforms and velocities in the internal carotid artery. RIGHT VERTEBRAL ARTERY: Antegrade flow and normal waveform in the right vertebral artery. LEFT CAROTID ARTERY: Mild disease in left common carotid artery. Small amount of echogenic plaque at the left carotid bulb. External carotid artery is patent with normal waveform. Normal waveforms and velocities in the internal carotid artery. LEFT VERTEBRAL ARTERY: Antegrade flow and normal waveform in the left vertebral artery. IMPRESSION: Mild to moderate atherosclerotic  disease in the carotid arteries, most prominent in the proximal right internal carotid artery. Estimated degree of stenosis in the internal carotid arteries is less than 50% bilaterally. Patent vertebral arteries with antegrade flow. Electronically Signed   By: Markus Daft M.D.   On: 12/09/2016 17:14    Medications:  I  have reviewed the patient's current medications. Scheduled: . amLODipine  10 mg Oral Daily  . aspirin  81 mg Oral Daily  . enoxaparin (LOVENOX) injection  40 mg Subcutaneous Q24H  . FLUoxetine  40 mg Oral Daily  . lisinopril  40 mg Oral Daily  . pravastatin  20 mg Oral q1800  . sodium chloride flush  3 mL Intravenous Q12H  . traZODone  100 mg Oral QHS    Assessment/Plan: Patient at baseline.  No further syncopal episodes.  MRI of the brain reviewed and shows no acute changes.  EEG unremarkable.  Patient does exhibit some orthostasis.    Recommendations: 1.  [facility] with good hydration encouraged 2.  Agree with holter recommended by cardiology.   3.  No further neurologic intervention is recommended at this time.  If further questions arise, please call or page at that time.  Thank you for allowing neurology to participate in the care of this patient.    LOS: 0 days   Alexis Goodell, MD Neurology [phone] 12/11/2016  10:27 AM

## 2016-12-11 NOTE — Discharge Summary (Signed)
[place] at [place] NAME: Kristin Avila    MR#:  [id number]  DATE OF BIRTH:  June 13, 1928  DATE OF ADMISSION:  12/08/2016 ADMITTING PHYSICIAN: Demetrios Loll, MD  DATE OF DISCHARGE: 12/11/16 PRIMARY CARE PHYSICIAN: Einar Pheasant, MD    ADMISSION DIAGNOSIS:  Syncope [R55] Syncope, unspecified syncope type [R55]  DISCHARGE DIAGNOSIS:  Active Problems:   Syncope   SECONDARY DIAGNOSIS:   Past Medical History:  Diagnosis Date  . Anemia   . Atrial fibrillation ([place]) 11/13   new onset 11/13  . Hypercholesterolemia   . Hypertension   . Osteoarthritis    hands, feet    HOSPITAL COURSE:   HPI  Kristin Avila  is a 83 y.o. female with a known history of hypertension, anemia, A. Fib and hyperlipidemia. The patient Presented in ED with above complaint. The patient complains of syncope episode today without preceding symptoms.she had similar syncope episode 2 days ago per daughter.the patient denies any headache or dizziness, no chest pain, palpitation, nausea, confusion or incontinence. She fell and hit her head. CT of the head did not show CVA but scalp hematoma.  Recurrent Syncope: Secondary to orthostatic hypotension and cardiac dysrhythmias Patient was monitored on telemetry and 3.5-4.2 second pauses were noticed but patient was asymptomatic. Cardiology has recommended HOLTER monitoring after discharging Patient was orthostatic and IV fluids were given.  Echocardiogram with normal ejection fraction 60-65%. Carotid Dopplers with less than 50% ICA stenosis.  Acute MI ruled out with negative troponins.  -EEG normal MRI of the brain is normal. Patient is fine to discharge from neurology standpoint -LDL at 113, with 10 year cardiac risk greater than 10. Patient is started on statin - OUTPAIENTfollow up with cardiology.-PT  no PT needs identified  * Hyponatremia and mild Dehydration. normal saline IV and follow-up BMP sodium 134-137     * Hypertension. Continue lisinopril and norvasc. Adjust as need  * History of A. Fib, now sinus rhythm.continued ASA.  DISCHARGE CONDITIONS:   Fair  CONSULTS OBTAINED:  Treatment Team:  Teodoro Spray, MD Alexis Goodell, MD   PROCEDURES -Holter monitoring  DRUG ALLERGIES:  No Known Allergies  DISCHARGE MEDICATIONS:   Current Discharge Medication List    START taking these medications   Details  acetaminophen (TYLENOL) 325 MG tablet Take 2 tablets (650 mg total) by mouth every 6 (six) hours as needed for mild pain, moderate pain or fever (or Fever >/= 101).    HYDROcodone-acetaminophen (NORCO/VICODIN) 5-325 MG tablet Take 1 tablet by mouth every 6 (six) hours as needed for severe pain. Qty: 15 tablet, Refills: 0    senna-docusate (SENOKOT-S) 8.6-50 MG tablet Take 1 tablet by mouth at bedtime as needed for mild constipation.      CONTINUE these medications which have CHANGED   Details  amLODipine (NORVASC) 10 MG tablet Take 1 tablet (10 mg total) by mouth daily. Qty: 30 tablet, Refills: 0      CONTINUE these medications which have NOT CHANGED   Details  aspirin 81 MG tablet Take 81 mg by mouth daily.    Cholecalciferol (VITAMIN D-3) 1000 UNITS CAPS Take 1 capsule by mouth daily.     FLUoxetine (PROZAC) 40 MG capsule Take 1 capsule (40 mg total) by mouth daily. Qty: 30 capsule, Refills: 6    lisinopril (PRINIVIL,ZESTRIL) 40 MG tablet TAKE ONE TABLET BY MOUTH ONCE DAILY Qty: 90 tablet, Refills: 1    lovastatin (MEVACOR) 20 MG tablet TAKE ONE  TABLET BY MOUTH ONCE DAILY Qty: 90 tablet, Refills: 1    naproxen sodium (ANAPROX) 220 MG tablet Take 220 mg by mouth 2 (two) times daily with a meal.    traZODone (DESYREL) 100 MG tablet Take 1 tablet (100 mg total) by mouth at bedtime. Qty: 30 tablet, Refills: 6      STOP taking these medications     ciprofloxacin-dexamethasone (CIPRODEX) otic suspension      mupirocin ointment (BACTROBAN) 2 %           DISCHARGE INSTRUCTIONS:   Continue Holter monitoring Outpatient follow-up with primary care physician in a week Outpatient follow-up with [facility] cardiology in 2 weeks   DIET:  Cardiac diet  DISCHARGE CONDITION:  Fair  ACTIVITY:  Activity as tolerated  OXYGEN:  Home Oxygen: No.   Oxygen Delivery: room air  DISCHARGE LOCATION:  home   If you experience worsening of your admission symptoms, develop shortness of breath, life threatening emergency, suicidal or homicidal thoughts you must seek medical attention immediately by calling 911 or calling your MD immediately  if symptoms less severe.  You Must read complete instructions/literature along with all the possible adverse reactions/side effects for all the Medicines you take and that have been prescribed to you. Take any new Medicines after you have completely understood and accpet all the possible adverse reactions/side effects.   Please note  You were cared for by a hospitalist during your hospital stay. If you have any questions about your discharge medications or the care you received while you were in the hospital after you are discharged, you can call the unit and asked to speak with the hospitalist on call if the hospitalist that took care of you is not available. Once you are discharged, your primary care physician will handle any further medical issues. Please note that NO REFILLS for any discharge medications will be authorized once you are discharged, as it is imperative that you return to your primary care physician (or establish a relationship with a primary care physician if you do not have one) for your aftercare needs so that they can reassess your need for medications and monitor your lab values.     Today  Chief Complaint  Patient presents with  . Fall  . Head Injury   Patient denies any chest pain or palpitations. Denies any dizziness or passing out spells. Had 3.5-4.2 second pauses on telemetry but  patient is asymptomatic. Okay to discharge patient with Holter monitoring as per my discussion with the cardiology. Patient and daughter are agreeable  ROS:  CONSTITUTIONAL: Denies fevers, chills. Denies any fatigue, weakness.  EYES: Denies blurry vision, double vision, eye pain. EARS, NOSE, THROAT: Denies tinnitus, ear pain, hearing loss. RESPIRATORY: Denies cough, wheeze, shortness of breath.  CARDIOVASCULAR: Denies chest pain, palpitations, edema.  GASTROINTESTINAL: Denies nausea, vomiting, diarrhea, abdominal pain. Denies bright red blood per rectum. GENITOURINARY: Denies dysuria, hematuria. ENDOCRINE: Denies nocturia or thyroid problems. HEMATOLOGIC AND LYMPHATIC: Denies easy bruising or bleeding. SKIN: Denies rash or lesion. MUSCULOSKELETAL: Denies pain in neck, back, shoulder, knees, hips or arthritic symptoms.  NEUROLOGIC: Denies paralysis, paresthesias.  PSYCHIATRIC: Denies anxiety or depressive symptoms.   VITAL SIGNS:  Blood pressure (!) 144/64, pulse 72, temperature 97.7 F (36.5 C), temperature source Oral, resp. rate 18, height 5\' 2"  (1.575 m), weight 74.4 kg (164 lb), SpO2 96 %.  I/O:    Intake/Output Summary (Last 24 hours) at 12/11/16 1315 Last data filed at 12/11/16 1108  Gross per 24  hour  Intake             2050 ml  Output             2150 ml  Net             -100 ml    PHYSICAL EXAMINATION:  GENERAL:  83 y.o.-year-old patient lying in the bed with no acute distress.  EYES: Pupils equal, round, reactive to light and accommodation. No scleral icterus. Extraocular muscles intact.  HEENT: Head atraumatic, normocephalic. Oropharynx and nasopharynx clear.  NECK:  Supple, no jugular venous distention. No thyroid enlargement, no tenderness.  LUNGS: Normal breath sounds bilaterally, no wheezing, rales,rhonchi or crepitation. No use of accessory muscles of respiration.  CARDIOVASCULAR: S1, S2 normal. No murmurs, rubs, or gallops.  ABDOMEN: Soft, non-tender,  non-distended. Bowel sounds present. No organomegaly or mass.  EXTREMITIES: No pedal edema, cyanosis, or clubbing.  NEUROLOGIC: Cranial nerves II through XII are intact. Muscle strength 5/5 in all extremities. Sensation intact. Gait not checked.  PSYCHIATRIC: The patient is alert and oriented x 3.  SKIN: No obvious rash, lesion, or ulcer.   DATA REVIEW:   CBC  Recent Labs Lab 12/11/16 0625  WBC 5.9  HGB 13.8  HCT 40.2  PLT 208    Chemistries   Recent Labs Lab 12/08/16 2145 12/11/16 0625  NA  --  137  K  --  3.9  CL  --  107  CO2  --  24  GLUCOSE  --  76  BUN  --  17  CREATININE  --  0.69  CALCIUM  --  8.8*  MG 2.0  --     Cardiac Enzymes  Recent Labs Lab 12/08/16 2145  TROPONINI 0.03*    Microbiology Results  Results for orders placed or performed in visit on 08/20/12  Urine culture     Status: None   Collection Time: 08/20/12  2:42 PM  Result Value Ref Range Status   Micro Text Report   Final       SOURCE: CLEAN CATCH    COMMENT                   NO GROWTH IN 36 HOURS   ANTIBIOTIC                                                        RADIOLOGY:  Ct Head Wo Contrast  Result Date: 12/08/2016 CLINICAL DATA:  Fall with hematoma on the head.  Initial encounter. EXAM: CT HEAD WITHOUT CONTRAST TECHNIQUE: Contiguous axial images were obtained from the base of the skull through the vertex without intravenous contrast. COMPARISON:  08/20/2012 FINDINGS: Brain: Normal. No evidence of acute or remote infarction, hemorrhage, hydrocephalus, extra-axial collection or mass lesion/mass effect. Vascular: Atherosclerotic calcification.  No hyperdense vessel. Skull: Right parietal scalp hematoma.  Negative for fracture Sinuses/Orbits: No evidence of injury IMPRESSION: 1. No evidence of intracranial injury. 2. Right parietal scalp hematoma without fracture. Electronically Signed   By: Monte Fantasia M.D.   On: 12/08/2016 18:38   Mr Brain Wo Contrast  Result Date:  12/10/2016 CLINICAL DATA:  Syncope EXAM: MRI HEAD WITHOUT CONTRAST TECHNIQUE: Multiplanar, multiecho pulse sequences of the brain and surrounding structures were obtained without intravenous contrast. COMPARISON:  None. FINDINGS: Brain: No acute infarction, hemorrhage,  hydrocephalus, extra-axial collection or mass lesion. Normal brain volume for age. Few FLAIR hyperintensities in the cerebral white matter, expected. Two possible remote micro hemorrhages in the right inferior cerebrum, nonspecific and consider incidental in isolation. Vascular: Major flow voids are preserved, including in the vertebrobasilar circulation. Skull and upper cervical spine: Mild facet spurring. No evidence of marrow lesion or injury. Sinuses/Orbits: Bilateral cataract resection.  No acute finding IMPRESSION: Unremarkable brain MRI for age. Electronically Signed   By: Monte Fantasia M.D.   On: 12/10/2016 13:45   US Carotid Bilateral  Result Date: 12/09/2016 CLINICAL DATA:  Syncope. EXAM: BILATERAL CAROTID DUPLEX ULTRASOUND TECHNIQUE: Pearline Cables scale imaging, color Doppler and duplex ultrasound were performed of bilateral carotid and vertebral arteries in the neck. COMPARISON:  08/20/2012 FINDINGS: Criteria: Quantification of carotid stenosis is based on velocity parameters that correlate the residual internal carotid diameter with NASCET-based stenosis levels, using the diameter of the distal internal carotid lumen as the denominator for stenosis measurement. The following velocity measurements were obtained: RIGHT ICA:  76 cm/sec CCA:  88 cm/sec SYSTOLIC ICA/CCA RATIO:  0.9 DIASTOLIC ICA/CCA RATIO:  1.5 ECA:  106 cm/sec LEFT ICA:  74 cm/sec CCA:  82 cm/sec SYSTOLIC ICA/CCA RATIO:  0.9 DIASTOLIC ICA/CCA RATIO:  0.8 ECA:  111 cm/sec RIGHT CAROTID ARTERY: Mild disease in the right common carotid artery. External carotid artery is patent with normal waveform. Moderate plaque at the carotid bulb and proximal internal carotid artery. Normal  waveforms and velocities in the internal carotid artery. RIGHT VERTEBRAL ARTERY: Antegrade flow and normal waveform in the right vertebral artery. LEFT CAROTID ARTERY: Mild disease in left common carotid artery. Small amount of echogenic plaque at the left carotid bulb. External carotid artery is patent with normal waveform. Normal waveforms and velocities in the internal carotid artery. LEFT VERTEBRAL ARTERY: Antegrade flow and normal waveform in the left vertebral artery. IMPRESSION: Mild to moderate atherosclerotic disease in the carotid arteries, most prominent in the proximal right internal carotid artery. Estimated degree of stenosis in the internal carotid arteries is less than 50% bilaterally. Patent vertebral arteries with antegrade flow. Electronically Signed   By: Markus Daft M.D.   On: 12/09/2016 17:14    EKG:   Orders placed or performed during the hospital encounter of 12/08/16  . ED EKG  . ED EKG  . EKG 12-Lead  . EKG 12-Lead      Management plans discussed with the patient, family and they are in agreement.  CODE STATUS:     Code Status Orders        Start     Ordered   12/08/16 2215  Full code  Continuous     12/08/16 2214    Code Status History    Date Active Date Inactive Code Status Order ID Comments User Context   This patient has a current code status but no historical code status.    Advance Directive Documentation     Most Recent Value  Type of Advance Directive  Healthcare Power of Attorney  Pre-existing out of facility DNR order (yellow form or pink MOST form)  -  "MOST" Form in Place?  -      TOTAL TIME TAKING CARE OF THIS PATIENT: 43  minutes.   Note: This dictation was prepared with Dragon dictation along with smaller phrase technology. Any transcriptional errors that result from this process are unintentional.   @M[facility] @  on 12/11/2016 at 1:15 PM  Between 7am to 6pm - Pager - [phone]  After 6pm go to [email or web address] - password [email or web address]  [facility]  Office  [phone]  CC: Primary care physician; Einar Pheasant, MD

## 2016-12-11 NOTE — Telephone Encounter (Signed)
Vivi Martens from [facility] called and needs to schedule a 5 day HFU for syncope. Please advise, thank you!  Call pt @ [phone]

## 2016-12-11 NOTE — Progress Notes (Signed)
DR Gouru was made aware of pt's having a 3.98 sec to 4.2 sec pause  With HR in the 140's not sustaining , and is back in a-fib , will continue to monitor .

## 2016-12-11 NOTE — Progress Notes (Signed)
[place] at [place] NAME: Diamond Jentz    MR#:  [id number]  DATE OF BIRTH:  11/06/1927  SUBJECTIVE:  CHIEF COMPLAINT:   Chief Complaint  Patient presents with  . Fall  . Head Injury     patient reports synopal episode without any lightheadedness , daughter at bedside  REVIEW OF SYSTEMS:  Review of Systems  Constitutional: Negative for chills, fever and weight loss.  HENT: Positive for hearing loss. Negative for nosebleeds and sore throat.   Eyes: Negative for blurred vision.  Respiratory: Negative for cough, shortness of breath and wheezing.   Cardiovascular: Negative for chest pain, orthopnea, leg swelling and PND.  Gastrointestinal: Negative for abdominal pain, constipation, diarrhea, heartburn, nausea and vomiting.  Genitourinary: Negative for dysuria and urgency.  Musculoskeletal: Negative for back pain.  Skin: Negative for rash.  Neurological: Negative for dizziness, speech change, focal weakness and headaches.  Endo/Heme/Allergies: Does not bruise/bleed easily.  Psychiatric/Behavioral: Negative for depression.    DRUG ALLERGIES:  No Known Allergies VITALS:  Blood pressure (!) 144/64, pulse 72, temperature 97.7 F (36.5 C), temperature source Oral, resp. rate 18, height 5\' 2"  (1.575 m), weight 74.4 kg (164 lb), SpO2 96 %. PHYSICAL EXAMINATION:  Physical Exam  Constitutional: She is oriented to person, place, and time and well-developed, well-nourished, and in no distress.  HENT:  Head: Normocephalic and atraumatic.  Eyes: Conjunctivae and EOM are normal. Pupils are equal, round, and reactive to light.  Neck: Normal range of motion. Neck supple. No tracheal deviation present. No thyromegaly present.  Cardiovascular: Normal rate, regular rhythm and normal heart sounds.   Pulmonary/Chest: Effort normal and breath sounds normal. No respiratory distress. She has no wheezes. She exhibits no tenderness.  Abdominal: Soft.  Bowel sounds are normal. She exhibits no distension. There is no tenderness.  Musculoskeletal: Normal range of motion.  Neurological: She is alert and oriented to person, place, and time. No cranial nerve deficit.  Skin: Skin is warm and dry. No rash noted.  Psychiatric: Mood and affect normal.   LABORATORY PANEL:  Female CBC  Recent Labs Lab 12/11/16 0625  WBC 5.9  HGB 13.8  HCT 40.2  PLT 208   ------------------------------------------------------------------------------------------------------------------ Chemistries   Recent Labs Lab 12/08/16 2145 12/11/16 0625  NA  --  137  K  --  3.9  CL  --  107  CO2  --  24  GLUCOSE  --  76  BUN  --  17  CREATININE  --  0.69  CALCIUM  --  8.8*  MG 2.0  --    RADIOLOGY:  No results found. ASSESSMENT AND PLAN:    Recurrent Syncope: Secondary to Sick sinus syndrome and orthostatic hypotension  Patient was monitored on telemetry and 3.5-4.2 second pauses were noticed , cardiology has scheduled to place pacemaker on Monday. Appreciate cardiology dr. Ubaldo Glassing recommendations Patient was orthostatic and IV fluids were given. Echocardiogram with normal ejection fraction 60-65%. Carotid Dopplers with less than 50% ICA stenosis.  Acute MI ruled out with negative troponins.  -EEG normal MRI of the brain is normal. Patient is fine to discharge from neurology standpoint -LDL at 113,with 10 year cardiac risk greater than 10. Patient is started on statin  * Hyponatremia and mild  Dehydration. normal saline IV and follow-up BMP sodium 137   * Hypertension. Continue lisinopril and norvasc. Adjust as need  * History of A. Fib, now sinus rhythm.continued ASA.     All  the records are reviewed and case discussed with Care Management/Social Worker. Management plans discussed with the patient, family, Dr Ubaldo Glassing and they are in agreement.  CODE STATUS: Full Code  TOTAL TIME TAKING CARE OF THIS PATIENT: 35 minutes.   More than 50% of the time  was spent in counseling/coordination of care: YES  POSSIBLE D/C IN 1 DAYS, DEPENDING ON CLINICAL CONDITION.   Nicholes Mango M.D on 12/11/2016 at 2:07 PM  Between 7am to 6pm - Pager - [phone]   After 6pm go to [email or web address] - Proofreader  [facility]  Office  [phone]  CC: Primary care physician; Einar Pheasant, MD  Note: This dictation was prepared with Dragon dictation along with smaller phrase technology. Any transcriptional errors that result from this process are unintentional.

## 2016-12-12 NOTE — Progress Notes (Signed)
[place] at [place] NAME: Kristin Avila    MR#:  [id number]  DATE OF BIRTH:  1927/10/07  SUBJECTIVE:  CHIEF COMPLAINT:   Chief Complaint  Patient presents with  . Fall  . Head Injury     patient  Is doing fine. No overnight events  @@  REVIEW OF SYSTEMS:  Review of Systems  Constitutional: Negative for chills, fever and weight loss.  HENT: Positive for hearing loss. Negative for nosebleeds and sore throat.   Eyes: Negative for blurred vision.  Respiratory: Negative for cough, shortness of breath and wheezing.   Cardiovascular: Negative for chest pain, orthopnea, leg swelling and PND.  Gastrointestinal: Negative for abdominal pain, constipation, diarrhea, heartburn, nausea and vomiting.  Genitourinary: Negative for dysuria and urgency.  Musculoskeletal: Negative for back pain.  Skin: Negative for rash.  Neurological: Negative for dizziness, speech change, focal weakness and headaches.  Endo/Heme/Allergies: Does not bruise/bleed easily.  Psychiatric/Behavioral: Negative for depression.    DRUG ALLERGIES:  No Known Allergies VITALS:  Blood pressure (!) 147/68, pulse (!) 59, temperature 97.8 F (36.6 C), temperature source Oral, resp. rate 15, height 5\' 2"  (1.575 m), weight 74.4 kg (164 lb), SpO2 97 %. PHYSICAL EXAMINATION:  Physical Exam  Constitutional: She is oriented to person, place, and time and well-developed, well-nourished, and in no distress.  HENT:  Head: Normocephalic and atraumatic.  Eyes: Conjunctivae and EOM are normal. Pupils are equal, round, and reactive to light.  Neck: Normal range of motion. Neck supple. No tracheal deviation present. No thyromegaly present.  Cardiovascular: Normal rate, regular rhythm and normal heart sounds.   Pulmonary/Chest: Effort normal and breath sounds normal. No respiratory distress. She has no wheezes. She exhibits no tenderness.  Abdominal: Soft. Bowel sounds are normal. She  exhibits no distension. There is no tenderness.  Musculoskeletal: Normal range of motion.  Neurological: She is alert and oriented to person, place, and time. No cranial nerve deficit.  Skin: Skin is warm and dry. No rash noted.  Psychiatric: Mood and affect normal.   LABORATORY PANEL:  Female CBC  Recent Labs Lab 12/11/16 0625  WBC 5.9  HGB 13.8  HCT 40.2  PLT 208   ------------------------------------------------------------------------------------------------------------------ Chemistries   Recent Labs Lab 12/08/16 2145 12/11/16 0625  NA  --  137  K  --  3.9  CL  --  107  CO2  --  24  GLUCOSE  --  76  BUN  --  17  CREATININE  --  0.69  CALCIUM  --  8.8*  MG 2.0  --    RADIOLOGY:  No results found. ASSESSMENT AND PLAN:    Recurrent Syncope: Secondary to Sick sinus syndrome and orthostatic hypotension  Pt is  Currently asymptomatic Patient was monitored on telemetry and 3.5-4.2 second pauses were noticed , cardiology has scheduled to place pacemaker on Monday. Appreciate cardiology dr. Ubaldo Glassing recommendations Patient was orthostatic and IV fluids were given. Echocardiogram with normal ejection fraction 60-65%. Carotid Dopplers with less than 50% ICA stenosis.  Acute MI ruled out with negative troponins.  -EEG normal MRI of the brain is normal. Patient is fine to discharge from neurology standpoint -LDL at 113,with 10 year cardiac risk greater than 10. Patient is started on statin  * Hyponatremia and mild  Dehydration. normal saline IV and follow-up BMP sodium 137   * Hypertension. Continue lisinopril and norvasc. Adjust as need  * History of A. Fib, now sinus rhythm.continued ASA.  All the records are reviewed and case discussed with Care Management/Social Worker. Management plans discussed with the patient, family, Dr Ubaldo Glassing and they are in agreement.  CODE STATUS: Full Code  TOTAL TIME TAKING CARE OF THIS PATIENT: 55minutes.   More than 50% of the  time was spent in counseling/coordination of care: YES  POSSIBLE D/C IN 2 DAYS, DEPENDING ON CLINICAL CONDITION.   Nicholes Mango M.D on 12/12/2016 at 8:09 AM  Between 7am to 6pm - Pager - [phone]   After 6pm go to [email or web address] - Proofreader  [facility]  Office  [phone]  CC: Primary care physician; Einar Pheasant, MD  Note: This dictation was prepared with Dragon dictation along with smaller phrase technology. Any transcriptional errors that result from this process are unintentional.

## 2016-12-13 MED ORDER — KCL IN DEXTROSE-NACL 20-5-0.9 MEQ/L-%-% IV SOLN
INTRAVENOUS | Status: AC
  Administered 2016-12-14: via INTRAVENOUS
  Filled 2016-12-13: qty 1000

## 2016-12-13 NOTE — Progress Notes (Signed)
       [facility]  SUBJECTIVE: no complaints   Vitals:   12/12/16 1938 12/13/16 0410 12/13/16 0742 12/13/16 1257  BP: (!) 145/63 126/61 (!) 142/63 (!) 107/50  Pulse: 72 67 66 67  Resp: 18 17 18 16   Temp: 98.2 F (36.8 C) 98.4 F (36.9 C) 98.4 F (36.9 C) 97.8 F (36.6 C)  TempSrc: Oral Oral Oral   SpO2: 97% 96% 90% 96%  Weight:      Height:        Intake/Output Summary (Last 24 hours) at 12/13/16 1323 Last data filed at 12/13/16 0900  Gross per 24 hour  Intake              480 ml  Output             1500 ml  Net            -1020 ml    LABS: Basic Metabolic Panel:  Recent Labs  12/11/16 0625  NA 137  K 3.9  CL 107  CO2 24  GLUCOSE 76  BUN 17  CREATININE 0.69  CALCIUM 8.8*   Liver Function Tests: No results for input(s): AST, ALT, ALKPHOS, BILITOT, PROT, ALBUMIN in the last 72 hours. No results for input(s): LIPASE, AMYLASE in the last 72 hours. CBC:  Recent Labs  12/11/16 0625  WBC 5.9  HGB 13.8  HCT 40.2  MCV 86.2  PLT 208   Cardiac Enzymes: No results for input(s): CKTOTAL, CKMB, CKMBINDEX, TROPONINI in the last 72 hours. BNP: Invalid input(s): POCBNP D-Dimer: No results for input(s): DDIMER in the last 72 hours. Hemoglobin A1C: No results for input(s): HGBA1C in the last 72 hours. Fasting Lipid Panel: No results for input(s): CHOL, HDL, LDLCALC, TRIG, CHOLHDL, LDLDIRECT in the last 72 hours. Thyroid Function Tests: No results for input(s): TSH, T4TOTAL, T3FREE, THYROIDAB in the last 72 hours.  Invalid input(s): FREET3 Anemia Panel: No results for input(s): VITAMINB12, FOLATE, FERRITIN, TIBC, IRON, RETICCTPCT in the last 72 hours.   Physical Exam: Blood pressure (!) 107/50, pulse 67, temperature 97.8 F (36.6 C), resp. rate 16, height 5\' 2"  (1.575 m), weight 74.4 kg (164 lb), SpO2 96 %.   Wt Readings from Last 1 Encounters:  12/08/16 74.4 kg (164 lb)     General appearance: alert and  cooperative Resp: clear to auscultation bilaterally Cardio: regular rate and rhythm GI: soft, non-tender; bowel sounds normal; no masses,  no organomegaly Extremities: extremities normal, atraumatic, no cyanosis or edema Neurologic: Grossly normal  TELEMETRY: Reviewed telemetry pt in nsr with intermitant afib.   ASSESSMENT AND PLAN:  Active Problems:   Syncope- Pt has had syncope. She had several episodes of afib with rapid vr with delayed atrial recovery time when converting back to nsr with symptomatic pauses up to 4 seconds. Will need ppm for back up due to several episodes of syncope. Scheduled for Monday.:    Teodoro Spray, MD, [facility] 12/13/2016 1:23 PM

## 2016-12-13 NOTE — Progress Notes (Signed)
Kristin Avila at [place] NAME: Kristin Avila    MR#:  [id number]  DATE OF BIRTH:  [birth year]  SUBJECTIVE:  CHIEF COMPLAINT:   Chief Complaint  Patient presents with  . Fall  . Head Injury     patient  Is doing fine. No overnight events ,Scheduled for place maker placement tomorrow 12/14/2016  REVIEW OF SYSTEMS:  Review of Systems  Constitutional: Negative for chills, fever and weight loss.  HENT: Positive for hearing loss. Negative for nosebleeds and sore throat.   Eyes: Negative for blurred vision.  Respiratory: Negative for cough, shortness of breath and wheezing.   Cardiovascular: Negative for chest pain, orthopnea, leg swelling and PND.  Gastrointestinal: Negative for abdominal pain, constipation, diarrhea, heartburn, nausea and vomiting.  Genitourinary: Negative for dysuria and urgency.  Musculoskeletal: Negative for back pain.  Skin: Negative for rash.  Neurological: Negative for dizziness, speech change, focal weakness and headaches.  Endo/Heme/Allergies: Does not bruise/bleed easily.  Psychiatric/Behavioral: Negative for depression.    DRUG ALLERGIES:  No Known Allergies VITALS:  Blood pressure (!) 142/63, pulse 66, temperature 98.4 F (36.9 C), temperature source Oral, resp. rate 18, height 5\' 2"  (1.575 m), weight 74.4 kg (164 lb), SpO2 90 %. PHYSICAL EXAMINATION:  Physical Exam  Constitutional: She is oriented to person, place, and time and well-developed, well-nourished, and in no distress.  HENT:  Head: Normocephalic and atraumatic.  Eyes: Conjunctivae and EOM are normal. Pupils are equal, round, and reactive to light.  Neck: Normal range of motion. Neck supple. No tracheal deviation present. No thyromegaly present.  Cardiovascular: Normal rate, regular rhythm and normal heart sounds.   Pulmonary/Chest: Effort normal and breath sounds normal. No respiratory distress. She has no wheezes. She exhibits no tenderness.    Abdominal: Soft. Bowel sounds are normal. She exhibits no distension. There is no tenderness.  Musculoskeletal: Normal range of motion.  Neurological: She is alert and oriented to person, place, and time. No cranial nerve deficit.  Skin: Skin is warm and dry. No rash noted.  Psychiatric: Mood and affect normal.   LABORATORY PANEL:  Female CBC  Recent Labs Lab 12/11/16 0625  WBC 5.9  HGB 13.8  HCT 40.2  PLT 208   ------------------------------------------------------------------------------------------------------------------ Chemistries   Recent Labs Lab 12/08/16 2145 12/11/16 0625  NA  --  137  K  --  3.9  CL  --  107  CO2  --  24  GLUCOSE  --  76  BUN  --  17  CREATININE  --  0.69  CALCIUM  --  8.8*  MG 2.0  --    RADIOLOGY:  No results found. ASSESSMENT AND PLAN:    Recurrent Syncope: Secondary to Sick sinus syndrome and orthostatic hypotension  Pt is Clinically stable Patient was monitored on telemetry and 3.5-4.2 second pauses were noticed , cardiology has scheduled to place pacemaker on Monday. Anticipate discharge on Tuesday after pacemaker placement Appreciate cardiology dr. Ubaldo Glassing recommendations Patient was orthostatic and IV fluids were given.Blood pressure  improved Echocardiogram with normal ejection fraction 60-65%. Carotid Dopplers with less than 50% ICA stenosis.  Acute MI ruled out with negative troponins.  -EEG normal MRI of the brain is normal. Patient is fine to discharge from neurology standpoint -LDL at 113,with 10 year cardiac risk greater than 10. Patient is started on statin  * Hyponatremia and mild  Dehydration. normal saline IV and follow-up BMP sodium 137   * Hypertension. Continue lisinopril and  norvasc. Adjust as need  * History of A. Fib, now sinus rhythm.continued ASA.     All the records are reviewed and case discussed with Care Management/Social Worker. Management plans discussed with the patient, family, Dr Ubaldo Glassing and they  are in agreement.  CODE STATUS: Full Code  TOTAL TIME TAKING CARE OF THIS PATIENT: 68minutes.   More than 50% of the time was spent in counseling/coordination of care: YES  POSSIBLE D/C IN 2 DAYS, DEPENDING ON CLINICAL CONDITION.   Nicholes Mango M.D on 12/13/2016 at 12:35 PM  Between 7am to 6pm - Pager - [phone]   After 6pm go to [email or web address] - Proofreader  [facility] [place]  Office  [phone]  CC: Primary care physician; Einar Pheasant, MD  Note: This dictation was prepared with Dragon dictation along with smaller phrase technology. Any transcriptional errors that result from this process are unintentional.

## 2016-12-14 ENCOUNTER — Observation Stay: Payer: Medicare Other | Admitting: Certified Registered"

## 2016-12-14 ENCOUNTER — Observation Stay: Payer: Medicare Other

## 2016-12-14 ENCOUNTER — Encounter
Admission: EM | Disposition: A | Discharge status: Home / Self Care | Payer: Self-pay | Source: Home / Self Care | Attending: Emergency Medicine

## 2016-12-14 HISTORY — PX: PACEMAKER INSERTION: SHX728

## 2016-12-14 LAB — BASIC METABOLIC PANEL WITH GFR
Anion gap: 5 (ref 5–15)
BUN: 21 mg/dL — ABNORMAL HIGH (ref 6–20)
CO2: 27 mmol/L (ref 22–32)
Calcium: 9.3 mg/dL (ref 8.9–10.3)
Chloride: 107 mmol/L (ref 101–111)
Creatinine, Ser: 0.81 mg/dL (ref 0.44–1.00)
GFR calc Af Amer: >60 mL/min
GFR calc non Af Amer: >60 mL/min
Glucose, Bld: 91 mg/dL (ref 65–99)
Potassium: 4.2 mmol/L (ref 3.5–5.1)
Sodium: 139 mmol/L (ref 135–145)

## 2016-12-14 LAB — CBC
HCT: 37.9 % (ref 35.0–47.0)
Hemoglobin: 12.9 g/dL (ref 12.0–16.0)
MCH: 28.7 pg (ref 26.0–34.0)
MCHC: 34.1 g/dL (ref 32.0–36.0)
MCV: 84.3 fL (ref 80.0–100.0)
Platelets: 230 10*3/uL (ref 150–440)
RBC: 4.49 MIL/uL (ref 3.80–5.20)
RDW: 14.3 % (ref 11.5–14.5)
WBC: 5.7 10*3/uL (ref 3.6–11.0)

## 2016-12-14 SURGERY — INSERTION, CARDIAC PACEMAKER
Anesthesia: General | Site: Chest | Laterality: Left | Wound class: Clean

## 2016-12-14 MED ORDER — SODIUM CHLORIDE 0.9 % IR SOLN
Status: DC | PRN
  Administered 2016-12-14: 200 mL

## 2016-12-14 MED ORDER — CEFAZOLIN SODIUM-DEXTROSE 2-4 GM/100ML-% IV SOLN: 2.0000 g | INTRAVENOUS | Status: DC

## 2016-12-14 MED ORDER — LIDOCAINE HCL (PF) 2 % IJ SOLN
INTRAMUSCULAR | Status: AC
  Filled 2016-12-14: qty 2

## 2016-12-14 MED ORDER — PROPOFOL 10 MG/ML IV BOLUS
INTRAVENOUS | Status: AC
  Filled 2016-12-14: qty 20

## 2016-12-14 MED ORDER — DEXTROSE 5 % IV SOLN
2.0000 g | Freq: Once | INTRAVENOUS | Status: AC
  Administered 2016-12-14: 2 g via INTRAVENOUS
  Filled 2016-12-14: qty 2000

## 2016-12-14 MED ORDER — CEFAZOLIN SODIUM-DEXTROSE 1-4 GM/50ML-% IV SOLN
1.0000 g | Freq: Four times a day (QID) | INTRAVENOUS | Status: AC
  Administered 2016-12-14 – 2016-12-15 (×2): 1 g via INTRAVENOUS
  Filled 2016-12-14 (×3): qty 50

## 2016-12-14 MED ORDER — CHLORHEXIDINE GLUCONATE 4 % EX LIQD: 60.0000 mL | Freq: Once | CUTANEOUS | Status: DC

## 2016-12-14 MED ORDER — PROPOFOL 500 MG/50ML IV EMUL
INTRAVENOUS | Status: DC | PRN
  Administered 2016-12-14: 75 ug/kg/min via INTRAVENOUS

## 2016-12-14 MED ORDER — PROPOFOL 10 MG/ML IV BOLUS
INTRAVENOUS | Status: DC | PRN
  Administered 2016-12-14: 40 mg via INTRAVENOUS

## 2016-12-14 MED ORDER — CHLORHEXIDINE GLUCONATE 4 % EX LIQD
60.0000 mL | Freq: Once | CUTANEOUS | Status: AC
  Administered 2016-12-14: 4 via TOPICAL

## 2016-12-14 MED ORDER — SODIUM CHLORIDE 0.9 % IV SOLN
INTRAVENOUS | Status: DC
  Administered 2016-12-14 (×2): via INTRAVENOUS

## 2016-12-14 MED ORDER — CEFAZOLIN SODIUM-DEXTROSE 2-4 GM/100ML-% IV SOLN
2.0000 g | Freq: Once | INTRAVENOUS | Status: AC
  Administered 2016-12-14: 2 g via INTRAVENOUS

## 2016-12-14 MED ORDER — GENTAMICIN SULFATE 40 MG/ML IJ SOLN
INTRAMUSCULAR | Status: AC
  Filled 2016-12-14: qty 2

## 2016-12-14 MED ORDER — PROPOFOL 500 MG/50ML IV EMUL
INTRAVENOUS | Status: AC
Start: 2016-12-14 — End: 2016-12-14
  Filled 2016-12-14: qty 50

## 2016-12-14 MED ORDER — LIDOCAINE 1 % OPTIME INJ - NO CHARGE
INTRAMUSCULAR | Status: DC | PRN
  Administered 2016-12-14: 30 mL

## 2016-12-14 MED ORDER — FENTANYL CITRATE (PF) 100 MCG/2ML IJ SOLN
25.0000 ug | INTRAMUSCULAR | Status: DC | PRN
  Administered 2016-12-14: 25 ug via INTRAVENOUS

## 2016-12-14 MED ORDER — FENTANYL CITRATE (PF) 100 MCG/2ML IJ SOLN
INTRAMUSCULAR | Status: AC
  Filled 2016-12-14: qty 2

## 2016-12-14 MED ORDER — ONDANSETRON HCL 4 MG/2ML IJ SOLN: 4.0000 mg | Freq: Once | INTRAMUSCULAR | Status: DC | PRN

## 2016-12-14 MED ORDER — LIDOCAINE HCL (CARDIAC) 20 MG/ML IV SOLN
INTRAVENOUS | Status: DC | PRN
  Administered 2016-12-14: 60 mg via INTRAVENOUS

## 2016-12-14 MED ORDER — GENTAMICIN SULFATE 40 MG/ML IJ SOLN
80.0000 mg | INTRAMUSCULAR | Status: DC
  Filled 2016-12-14 (×2): qty 2

## 2016-12-14 MED ORDER — ACETAMINOPHEN 325 MG PO TABS: 325.0000 mg | ORAL_TABLET | ORAL | Status: DC | PRN

## 2016-12-14 MED ORDER — ONDANSETRON HCL 4 MG/2ML IJ SOLN
INTRAMUSCULAR | Status: DC | PRN
  Administered 2016-12-14: 4 mg via INTRAVENOUS

## 2016-12-14 MED ORDER — ONDANSETRON HCL 4 MG/2ML IJ SOLN: 4.0000 mg | Freq: Four times a day (QID) | INTRAMUSCULAR | Status: DC | PRN

## 2016-12-14 MED ORDER — CEFAZOLIN SODIUM-DEXTROSE 2-4 GM/100ML-% IV SOLN
INTRAVENOUS | Status: AC
  Filled 2016-12-14: qty 100

## 2016-12-14 MED ORDER — ONDANSETRON HCL 4 MG/2ML IJ SOLN
INTRAMUSCULAR | Status: AC
  Filled 2016-12-14: qty 2

## 2016-12-14 SURGICAL SUPPLY — 37 items
BAG DECANTER FOR FLEXI CONT (MISCELLANEOUS) ×3 IMPLANT
BLADE PHOTON ILLUMINATED (MISCELLANEOUS) IMPLANT
BRUSH SCRUB 4% CHG (MISCELLANEOUS) ×3 IMPLANT
CABLE SURG 12 DISP A/V CHANNEL (MISCELLANEOUS) ×3 IMPLANT
CANISTER SUCT 1200ML W/VALVE (MISCELLANEOUS) ×3 IMPLANT
CHLORAPREP W/TINT 26ML (MISCELLANEOUS) ×3 IMPLANT
COVER LIGHT HANDLE STERIS (MISCELLANEOUS) ×6 IMPLANT
COVER MAYO STAND STRL (DRAPES) ×3 IMPLANT
DRAPE C-ARM XRAY 36X54 (DRAPES) ×3 IMPLANT
DRSG TEGADERM 4X4.75 (GAUZE/BANDAGES/DRESSINGS) ×3 IMPLANT
DRSG TELFA 4X3 1S NADH ST (GAUZE/BANDAGES/DRESSINGS) ×2 IMPLANT
ELECT REM PT RETURN 9FT ADLT (ELECTROSURGICAL) ×3
ELECTRODE REM PT RTRN 9FT ADLT (ELECTROSURGICAL) ×1 IMPLANT
GLOVE BIO SURGEON STRL SZ7.5 (GLOVE) ×3 IMPLANT
GLOVE BIO SURGEON STRL SZ8 (GLOVE) ×3 IMPLANT
GOWN STRL REUS W/ TWL LRG LVL3 (GOWN DISPOSABLE) ×1 IMPLANT
GOWN STRL REUS W/ TWL XL LVL3 (GOWN DISPOSABLE) ×1 IMPLANT
GOWN STRL REUS W/TWL LRG LVL3 (GOWN DISPOSABLE) ×2
GOWN STRL REUS W/TWL XL LVL3 (GOWN DISPOSABLE) ×2
IMMOBILIZER SHDR MD LX WHT (SOFTGOODS) IMPLANT
IMMOBILIZER SHDR XL LX WHT (SOFTGOODS) IMPLANT
INTRO PACEMAKR LEAD 9FR 13CM (INTRODUCER) ×3
INTRO PACEMKR SHEATH II 7FR (MISCELLANEOUS) ×3
INTRODUCER PACEMKR LD 9FR 13CM (INTRODUCER) ×1 IMPLANT
INTRODUCER PACEMKR SHTH II 7FR (MISCELLANEOUS) ×1 IMPLANT
IPG PACE AZUR XT DR MRI W1DR01 (Pacemaker) ×1 IMPLANT
IV NS 500ML (IV SOLUTION) ×2
IV NS 500ML BAXH (IV SOLUTION) ×1 IMPLANT
KIT RM TURNOVER STRD PROC AR (KITS) ×3 IMPLANT
LABEL OR SOLS (LABEL) ×3 IMPLANT
LEAD CAPSURE NOVUS 5076-52CM (Lead) ×3 IMPLANT
LEAD CAPSURE NOVUS 5076-58CM (Lead) ×3 IMPLANT
MARKER SKIN DUAL TIP RULER LAB (MISCELLANEOUS) ×3 IMPLANT
PACE AZURE XT DR MRI W1DR01 (Pacemaker) ×3 IMPLANT
PACK PACE INSERTION (MISCELLANEOUS) ×3 IMPLANT
PAD ONESTEP ZOLL R SERIES ADT (MISCELLANEOUS) ×3 IMPLANT
SUT SILK 0 SH 30 (SUTURE) ×9 IMPLANT

## 2016-12-14 NOTE — Progress Notes (Signed)
Pt was given a CHG bath using warmed wipes, IV antibiotic given per order and hibiclens was used per order. I will continue to assess.

## 2016-12-14 NOTE — Telephone Encounter (Signed)
Patient readmitted for pace maker placement.

## 2016-12-14 NOTE — Transfer of Care (Signed)
Immediate Anesthesia Transfer of Care Note  Patient: Kristin Avila  Procedure(s) Performed: Procedure(s): INSERTION PACEMAKER (Left)  Patient Location: PACU  Anesthesia Type:General  Level of Consciousness: awake, alert , oriented and patient cooperative  Airway & Oxygen Therapy: Patient Spontanous Breathing and Patient connected to face mask oxygen  Post-op Assessment: Report given to RN, Post -op Vital signs reviewed and stable and Patient moving all extremities X 4  Post vital signs: Reviewed and stable  Last Vitals:  Vitals:   12/14/16 0415 12/14/16 1135  BP: (!) 151/58 (!) 159/69  Pulse: 65 65  Resp: 18 16  Temp: 36.8 C     Last Pain:  Vitals:   12/14/16 1135  TempSrc: Tympanic  PainSc:          Complications: No apparent anesthesia complications

## 2016-12-14 NOTE — Progress Notes (Signed)
Physical Therapy Treatment Patient Details Name: Kristin Avila MRN: [id number] DOB: 01/15/1928 Today's Date: 12/14/2016    History of Present Illness Patient is an 83 y/o female that presents after multiple syncopal episodes. She has a history of A-fib, CT scan was negative. She was in the kitchen during her episodes, does not recall what activities she was doing prior.     PT Comments    Pt awaiting pacemaker placement this am but wanting to walk.  Stood and was able to ambulate 160' pushing IV pole with BUE,  Attempted to ambulate without IV pole but she was hesitant and stated she was more confident with pole.    Discussed use of rolling walker again this session.  Pt had previously declined trial but today she laughed stating that she did not recall our conversation previously.  Stated she would be open to a walker at this time for balance.  Added to equipment recommendations below.  Son in and aware.  Pt should use walker at all times until gait and balance improve.   Follow Up Recommendations  No PT follow up     Equipment Recommendations  Rolling walker with 5" wheels    Recommendations for Other Services       Precautions / Restrictions Precautions Precautions: Fall Precaution Comments: BP - Pacemaker scheduled for 5/7 Restrictions Weight Bearing Restrictions: No    Mobility  Bed Mobility               General bed mobility comments: Patient seated in bedside recliner.   Transfers Overall transfer level: Modified independent               General transfer comment: Patient requires mild use of UEs, no loss of balance noted.   Ambulation/Gait Ambulation/Gait assistance: Min guard Ambulation Distance (Feet): 160 Feet Assistive device: None Gait Pattern/deviations: Step-through pattern   Gait velocity interpretation: Below normal speed for age/gender General Gait Details: Pushed IV pole, pt reported feeling more confident with use of IV pole for  balance   Stairs            Wheelchair Mobility    Modified Rankin (Stroke Patients Only)       Balance Overall balance assessment: Needs assistance Sitting-balance support: Feet supported Sitting balance-Leahy Scale: Normal     Standing balance support: No upper extremity supported Standing balance-Leahy Scale: Fair                              Cognition Arousal/Alertness: Awake/alert Behavior During Therapy: WFL for tasks assessed/performed Overall Cognitive Status: Within Functional Limits for tasks assessed                                        Exercises      General Comments        Pertinent Vitals/Pain Pain Assessment: No/denies pain    Home Living                      Prior Function            PT Goals (current goals can now be found in the care plan section) Progress towards PT goals: Progressing toward goals    Frequency    Min 2X/week      PT Plan Current plan remains appropriate    Co-evaluation  AM-PAC PT "6 Clicks" Daily Activity  Outcome Measure  Difficulty turning over in bed (including adjusting bedclothes, sheets and blankets)?: None Difficulty moving from lying on back to sitting on the side of the bed? : None Difficulty sitting down on and standing up from a chair with arms (e.g., wheelchair, bedside commode, etc,.)?: None Help needed moving to and from a bed to chair (including a wheelchair)?: None Help needed walking in hospital room?: A Little   6 Click Score: 19    End of Session Equipment Utilized During Treatment: Gait belt Activity Tolerance: Patient tolerated treatment well Patient left: in chair;with chair alarm set;with call bell/phone within reach;with family/visitor present         Time: 8333-8329 PT Time Calculation (min) (ACUTE ONLY): 8 min  Charges:  $Gait Training: 8-22 mins                    G Codes:       Chesley Noon,  PTA 12/14/16, 9:43 AM

## 2016-12-14 NOTE — Anesthesia Post-op Follow-up Note (Cosign Needed)
Anesthesia QCDR form completed.        

## 2016-12-14 NOTE — Anesthesia Preprocedure Evaluation (Addendum)
Anesthesia Evaluation  Patient identified by MRN, date of birth, ID band Patient awake    Reviewed: Allergy & Precautions, H&P , NPO status , Patient's Chart, lab work & pertinent test results, reviewed documented beta blocker date and time   Airway Mallampati: II   Neck ROM: full    Dental  (+) Poor Dentition, Teeth Intact   Pulmonary neg pulmonary ROS,    Pulmonary exam normal        Cardiovascular Exercise Tolerance: Poor hypertension, (-) anginanegative cardio ROS Normal cardiovascular exam+ dysrhythmias Atrial Fibrillation  Rhythm:regular Rate:Normal  Symptomatic pauses   Neuro/Psych negative neurological ROS  negative psych ROS   GI/Hepatic negative GI ROS, Neg liver ROS,   Endo/Other  negative endocrine ROS  Renal/GU negative Renal ROS  negative genitourinary   Musculoskeletal  (+) Arthritis ,   Abdominal   Peds  Hematology negative hematology ROS (+) anemia ,   Anesthesia Other Findings Past Medical History: No date: Anemia 11/13: Atrial fibrillation (HCC)     Comment: new onset 11/13 No date: Hypercholesterolemia No date: Hypertension No date: Osteoarthritis     Comment: hands, feet Past Surgical History: 1957: APPENDECTOMY 2013: CATARACT EXTRACTION     Comment: left eye age 57: KIDNEY SURGERY No date: TUBAL LIGATION BMI    Body Mass Index:  30.00 kg/m     Reproductive/Obstetrics negative OB ROS                            Anesthesia Physical Anesthesia Plan  ASA: III  Anesthesia Plan: General   Post-op Pain Management:    Induction:   Airway Management Planned:   Additional Equipment:   Intra-op Plan:   Post-operative Plan:   Informed Consent: I have reviewed the patients History and Physical, chart, labs and discussed the procedure including the risks, benefits and alternatives for the proposed anesthesia with the patient or authorized representative  who has indicated his/her understanding and acceptance.   Dental Advisory Given  Plan Discussed with: CRNA  Anesthesia Plan Comments:         Anesthesia Quick Evaluation

## 2016-12-14 NOTE — Progress Notes (Signed)
Pt returns from special procedures s/p ICD placement. Bandage to left upper chest has one small amount of blood. The area is circled. No other complaints are noted. I will continue to assess.

## 2016-12-14 NOTE — Telephone Encounter (Signed)
Noted.  Can f/u with her if needed.  May need cardiology f/u.

## 2016-12-14 NOTE — Progress Notes (Signed)
[place] at [place] NAME: Kristin Avila    MR#:  [id number]  DATE OF BIRTH:  09/26/1927  SUBJECTIVE:  CHIEF COMPLAINT:   Chief Complaint  Patient presents with  . Fall  . Head Injury     patient  Is scheduled for place maker placement today 12/14/2016, no complaints today  REVIEW OF SYSTEMS:  Review of Systems  Constitutional: Negative for chills, fever and weight loss.  HENT: Positive for hearing loss. Negative for nosebleeds and sore throat.   Eyes: Negative for blurred vision.  Respiratory: Negative for cough, shortness of breath and wheezing.   Cardiovascular: Negative for chest pain, orthopnea, leg swelling and PND.  Gastrointestinal: Negative for abdominal pain, constipation, diarrhea, heartburn, nausea and vomiting.  Genitourinary: Negative for dysuria and urgency.  Musculoskeletal: Negative for back pain.  Skin: Negative for rash.  Neurological: Negative for dizziness, speech change, focal weakness and headaches.  Endo/Heme/Allergies: Does not bruise/bleed easily.  Psychiatric/Behavioral: Negative for depression.    DRUG ALLERGIES:  No Known Allergies VITALS:  Blood pressure (!) 159/69, pulse 65, temperature 98.3 F (36.8 C), temperature source Oral, resp. rate 16, height 5\' 2"  (1.575 m), weight 74.4 kg (164 lb), SpO2 97 %. PHYSICAL EXAMINATION:  Physical Exam  Constitutional: She is oriented to person, place, and time and well-developed, well-nourished, and in no distress.  HENT:  Head: Normocephalic and atraumatic.  Eyes: Conjunctivae and EOM are normal. Pupils are equal, round, and reactive to light.  Neck: Normal range of motion. Neck supple. No tracheal deviation present. No thyromegaly present.  Cardiovascular: Normal rate, regular rhythm and normal heart sounds.   Pulmonary/Chest: Effort normal and breath sounds normal. No respiratory distress. She has no wheezes. She exhibits no tenderness.  Abdominal: Soft.  Bowel sounds are normal. She exhibits no distension. There is no tenderness.  Musculoskeletal: Normal range of motion.  Neurological: She is alert and oriented to person, place, and time. No cranial nerve deficit.  Skin: Skin is warm and dry. No rash noted.  Psychiatric: Mood and affect normal.   LABORATORY PANEL:  Female CBC  Recent Labs Lab 12/14/16 0518  WBC 5.7  HGB 12.9  HCT 37.9  PLT 230   ------------------------------------------------------------------------------------------------------------------ Chemistries   Recent Labs Lab 12/08/16 2145  12/14/16 0518  NA  --   < > 139  K  --   < > 4.2  CL  --   < > 107  CO2  --   < > 27  GLUCOSE  --   < > 91  BUN  --   < > 21*  CREATININE  --   < > 0.81  CALCIUM  --   < > 9.3  MG 2.0  --   --   < > = values in this interval not displayed. RADIOLOGY:  No results found. ASSESSMENT AND PLAN:    * Recurrent Syncope: Secondary to Sick sinus syndrome and orthostatic hypotension  Pt is Clinically stable Patient was monitored on telemetry and 3.5-4.2 second pauses were noticed , cardiology has scheduled to place pacemaker today. Anticipate discharge on Tuesday after pacemaker placement Appreciate cardiology dr. Ubaldo Glassing recommendations Patient was orthostatic and IV fluids were given.Blood pressure  improved Echocardiogram with normal ejection fraction 60-65%. Carotid Dopplers with less than 50% ICA stenosis.  Acute MI ruled out with negative troponins.  -EEG normal MRI of the brain is normal. Patient is fine to discharge from neurology standpoint -LDL at 113,with 10  year cardiac risk greater than 10. Patient is started on statin  * Hyponatremia and mild  Dehydration. normal saline IV and follow-up BMP sodium 137 -139  * Hypertension. Continue lisinopril and norvasc. Adjust as need  * History of A. Fib, now sinus rhythm.continued ASA.     All the records are reviewed and case discussed with Care Management/Social  Worker. Management plans discussed with the patient, family, Dr Ubaldo Glassing and they are in agreement.  CODE STATUS: Full Code  TOTAL TIME TAKING CARE OF THIS PATIENT: 77minutes.   More than 50% of the time was spent in counseling/coordination of care: YES  POSSIBLE D/C IN 1 DAYS, DEPENDING ON CLINICAL CONDITION.   Nicholes Mango M.D on 12/14/2016 at 1:04 PM  Between 7am to 6pm - Pager - [phone]   After 6pm go to [email or web address] - Proofreader  [facility] [place]  Office  [phone]  CC: Primary care physician; Einar Pheasant, MD  Note: This dictation was prepared with Dragon dictation along with smaller phrase technology. Any transcriptional errors that result from this process are unintentional.

## 2016-12-14 NOTE — Op Note (Signed)
[facility]   12/08/2016 - 12/14/2016                     1:45 PM  PATIENT:  Kristin Avila    PRE-OPERATIVE DIAGNOSIS:  syncope with 4 second pauses  POST-OPERATIVE DIAGNOSIS:  Same  PROCEDURE:  INSERTION PACEMAKER  SURGEON:  Isaias Cowman, MD    ANESTHESIA:     PREOPERATIVE INDICATIONS:  Kristin Avila is a  83 y.o. female with a diagnosis of syncope with 4 second pauses who failed conservative measures and elected for surgical management.    The risks benefits and alternatives were discussed with the patient preoperatively including but not limited to the risks of infection, bleeding, cardiopulmonary complications, the need for revision surgery, among others, and the patient was willing to proceed.   OPERATIVE PROCEDURE: The patient was brought to the operating room fasting state. The left pectoral region was prepped and draped in usual sterile manner. Anesthesia was obtained 1% lidocaine locally. A 6 cm incision over the left pectoral region. The pacemaker pocket generated by electrocautery and blunt dissection. Access was obtained to the left subclavian vein by fine-needle aspiration. MRI-compatible leads were positioned into the right ventricular apical septum and right appendage under fluoroscopic guidance. After proper thresholds were obtained and leads were sutured in place. The leads were connected to a MRI compatible dual chamber rate responsive pacemaker generator ( [employer] [id number]). The pocket was closed with 2-0 and 4-0 Vicryl, respectively. Steri-Strips and pressure dressing applied.

## 2016-12-14 NOTE — Progress Notes (Signed)
       [place]  SUBJECTIVE: no further syncope   Vitals:   12/13/16 2021 12/14/16 0415 12/14/16 1135 12/14/16 1145  BP: 112/67 (!) 151/58 (!) 159/69   Pulse: 69 65 65   Resp: 18 18 16    Temp: 98.2 F (36.8 C) 98.3 F (36.8 C)    TempSrc: Oral Oral Tympanic   SpO2: 96% 94% 97%   Weight:    74.4 kg (164 lb)  Height:        Intake/Output Summary (Last 24 hours) at 12/14/16 1156 Last data filed at 12/14/16 0751  Gross per 24 hour  Intake              360 ml  Output              300 ml  Net               60 ml    LABS: Basic Metabolic Panel:  Recent Labs  12/14/16 0518  NA 139  K 4.2  CL 107  CO2 27  GLUCOSE 91  BUN 21*  CREATININE 0.81  CALCIUM 9.3   Liver Function Tests: No results for input(s): AST, ALT, ALKPHOS, BILITOT, PROT, ALBUMIN in the last 72 hours. No results for input(s): LIPASE, AMYLASE in the last 72 hours. CBC:  Recent Labs  12/14/16 0518  WBC 5.7  HGB 12.9  HCT 37.9  MCV 84.3  PLT 230   Cardiac Enzymes: No results for input(s): CKTOTAL, CKMB, CKMBINDEX, TROPONINI in the last 72 hours. BNP: Invalid input(s): POCBNP D-Dimer: No results for input(s): DDIMER in the last 72 hours. Hemoglobin A1C: No results for input(s): HGBA1C in the last 72 hours. Fasting Lipid Panel: No results for input(s): CHOL, HDL, LDLCALC, TRIG, CHOLHDL, LDLDIRECT in the last 72 hours. Thyroid Function Tests: No results for input(s): TSH, T4TOTAL, T3FREE, THYROIDAB in the last 72 hours.  Invalid input(s): FREET3 Anemia Panel: No results for input(s): VITAMINB12, FOLATE, FERRITIN, TIBC, IRON, RETICCTPCT in the last 72 hours.   Physical Exam: Blood pressure (!) 159/69, pulse 65, temperature 98.3 F (36.8 C), temperature source Oral, resp. rate 16, height 5\' 2"  (1.575 m), weight 74.4 kg (164 lb), SpO2 97 %.   Wt Readings from Last 1 Encounters:  12/14/16 74.4 kg (164 lb)     General appearance: alert and  cooperative Resp: clear to auscultation bilaterally Cardio: regular rate and rhythm Neurologic: Grossly normal  TELEMETRY: Reviewed telemetry pt in nsr:  ASSESSMENT AND PLAN:  Active Problems:   Syncope-no further pauses. FOr PPM for symptomatic pauses.     Teodoro Spray, MD, [facility] 12/14/2016 11:56 AM

## 2016-12-15 ENCOUNTER — Encounter: Payer: Self-pay | Admitting: Cardiology

## 2016-12-15 LAB — CREATININE, SERUM
CREATININE: 0.7 mg/dL (ref 0.44–1.00)
GFR calc non Af Amer: >60 mL/min (ref >60)

## 2016-12-15 LAB — CBC
HCT: 40.4 % (ref 35.0–47.0)
Hemoglobin: 13.8 g/dL (ref 12.0–16.0)
MCH: 29 pg (ref 26.0–34.0)
MCHC: 34.1 g/dL (ref 32.0–36.0)
MCV: 84.9 fL (ref 80.0–100.0)
Platelets: 235 10*3/uL (ref 150–440)
RBC: 4.76 MIL/uL (ref 3.80–5.20)
RDW: 14.5 % (ref 11.5–14.5)
WBC: 7.4 10*3/uL (ref 3.6–11.0)

## 2016-12-15 NOTE — Progress Notes (Signed)
Discharge paperwork reviewed with patient. Information regarding follow-up appointments, medications and education for all newly prescribed meds was provided, all questions answered. Peripheral IV removed, catheter intact. Heart monitor removed, returned to the nurse station. Transport requested via wheelchair to the lobby for discharge.    

## 2016-12-15 NOTE — Discharge Summary (Signed)
[facility]: Kristin Avila    MR#:  [id number]  DATE OF BIRTH:  09/09/1927  DATE OF ADMISSION:  12/08/2016 ADMITTING PHYSICIAN: Demetrios Loll, MD  DATE OF DISCHARGE: 12/15/16 PRIMARY CARE PHYSICIAN: Einar Pheasant, MD    ADMISSION DIAGNOSIS:  Syncope [R55] Syncope, unspecified syncope type [R55]  DISCHARGE DIAGNOSIS:  Active Problems:   Syncope   SECONDARY DIAGNOSIS:   Past Medical History:  Diagnosis Date  . Anemia   . Atrial fibrillation ([place]) 11/13   new onset 11/13  . Hypercholesterolemia   . Hypertension   . Osteoarthritis    hands, feet    HOSPITAL COURSE:  HPI Kristin Avila  is a 83 y.o. female with a known history of hypertension, anemia, A. Fib and hyperlipidemia. The patient Presented in ED with above complaint. The patient complains of syncope episode today without preceding symptoms.she had similar syncope episode 2 days ago per daughter.the patient denies any headache or dizziness, no chest pain, palpitation, nausea, confusion or incontinence. She fell and hit her head. CT of the head did not show CVA but scalp hematoma.  Hospital course   * Recurrent Syncope: Secondary to Sick sinus syndrome and orthostatic hypotension  Pt is Clinically stable, s/p permanent pacemaker placement on 12/14/2016. Patient tolerated the procedure well  Patient was monitored on telemetry and 3.5-4.2 second pauses were noticed during the hospital course , cardiology has placed a permanent pacemaker. okay to discharge patient from cardiology standpoint  Appreciate cardiology dr. Ubaldo Glassing recommendations Patient was orthostatic and IV fluids were given.Blood pressure  improved Patient had brief episodes of atrial fibrillation during the hospitalization. Differing chronic anticoagulation secondary to fall risk. Patient will be discharged home with lisinopril 40 daily, pravastatin 20 mg daily, amlodipine 10 mg daily, and asa 81 mg  daily. Follow up in 1 week with cardio Echocardiogram with normal ejection fraction 60-65%. Carotid Dopplers with less than 50% ICA stenosis. Acute MI ruled out with negative troponins.  -EEG normal MRI of the brain is normal. Patient is fineto dischargefrom neurology standpoint -LDL at 113,with 10 year cardiac risk greater than 10. Patient is started on statin  * Hyponatremia and mild Dehydration. normal saline IV and follow-up BMP sodium 137 -139  * Hypertension. Continue lisinopril and norvasc. Adjust as need  * History of A. Fib, now sinus rhythm.continued ASA.   DISCHARGE CONDITIONS:   Stable  CONSULTS OBTAINED:  Treatment Team:  Teodoro Spray, MD Alexis Goodell, MD Isaias Cowman, MD   PROCEDURES Permanent pacemaker placed on 12/14/2016  DRUG ALLERGIES:  No Known Allergies  DISCHARGE MEDICATIONS:   Current Discharge Medication List    START taking these medications   Details  acetaminophen (TYLENOL) 325 MG tablet Take 2 tablets (650 mg total) by mouth every 6 (six) hours as needed for mild pain, moderate pain or fever (or Fever >/= 101).    HYDROcodone-acetaminophen (NORCO/VICODIN) 5-325 MG tablet Take 1 tablet by mouth every 6 (six) hours as needed for severe pain. Qty: 15 tablet, Refills: 0    senna-docusate (SENOKOT-S) 8.6-50 MG tablet Take 1 tablet by mouth at bedtime as needed for mild constipation.      CONTINUE these medications which have CHANGED   Details  amLODipine (NORVASC) 10 MG tablet Take 1 tablet (10 mg total) by mouth daily. Qty: 30 tablet, Refills: 0      CONTINUE these medications which have NOT CHANGED   Details  aspirin 81 MG tablet  Take 81 mg by mouth daily.    Cholecalciferol (VITAMIN D-3) 1000 UNITS CAPS Take 1 capsule by mouth daily.     FLUoxetine (PROZAC) 40 MG capsule Take 1 capsule (40 mg total) by mouth daily. Qty: 30 capsule, Refills: 6    lisinopril (PRINIVIL,ZESTRIL) 40 MG tablet TAKE ONE TABLET BY  MOUTH ONCE DAILY Qty: 90 tablet, Refills: 1    lovastatin (MEVACOR) 20 MG tablet TAKE ONE TABLET BY MOUTH ONCE DAILY Qty: 90 tablet, Refills: 1    naproxen sodium (ANAPROX) 220 MG tablet Take 220 mg by mouth 2 (two) times daily with a meal.    traZODone (DESYREL) 100 MG tablet Take 1 tablet (100 mg total) by mouth at bedtime. Qty: 30 tablet, Refills: 6      STOP taking these medications     ciprofloxacin-dexamethasone (CIPRODEX) otic suspension      mupirocin ointment (BACTROBAN) 2 %          DISCHARGE INSTRUCTIONS:  Outpatient follow-up with primary care physician in a week Outpatient follow-up with [facility] cardiology in  weeks    DIET:  Cardiac diet  DISCHARGE CONDITION:  Stable  ACTIVITY:  Activity as tolerated  OXYGEN:  Home Oxygen: No.   Oxygen Delivery: room air  DISCHARGE LOCATION:  home   If you experience worsening of your admission symptoms, develop shortness of breath, life threatening emergency, suicidal or homicidal thoughts you must seek medical attention immediately by calling 911 or calling your MD immediately  if symptoms less severe.  You Must read complete instructions/literature along with all the possible adverse reactions/side effects for all the Medicines you take and that have been prescribed to you. Take any new Medicines after you have completely understood and accpet all the possible adverse reactions/side effects.   Please note  You were cared for by a hospitalist during your hospital stay. If you have any questions about your discharge medications or the care you received while you were in the hospital after you are discharged, you can call the unit and asked to speak with the hospitalist on call if the hospitalist that took care of you is not available. Once you are discharged, your primary care physician will handle any further medical issues. Please note that NO REFILLS for any discharge medications will be authorized once you are  discharged, as it is imperative that you return to your primary care physician (or establish a relationship with a primary care physician if you do not have one) for your aftercare needs so that they can reassess your need for medications and monitor your lab values.     Today  Chief Complaint  Patient presents with  . Fall  . Head Injury   Patient is resting comfortably. Had pacemaker placement yesterday. Denies any pain. No new complaints. Wants to go home. Daughter will pick her up in the afternoon  ROS:  CONSTITUTIONAL: Denies fevers, chills. Denies any fatigue, weakness.  EYES: Denies blurry vision, double vision, eye pain. EARS, NOSE, THROAT: Denies tinnitus, ear pain, hearing loss. RESPIRATORY: Denies cough, wheeze, shortness of breath.  CARDIOVASCULAR: Denies chest pain, palpitations, edema.  GASTROINTESTINAL: Denies nausea, vomiting, diarrhea, abdominal pain. Denies bright red blood per rectum. GENITOURINARY: Denies dysuria, hematuria. ENDOCRINE: Denies nocturia or thyroid problems. HEMATOLOGIC AND LYMPHATIC: Denies easy bruising or bleeding. SKIN: Denies rash or lesion. MUSCULOSKELETAL: Denies pain in neck, back, shoulder, knees, hips or arthritic symptoms.  NEUROLOGIC: Denies paralysis, paresthesias.  PSYCHIATRIC: Denies anxiety or depressive symptoms.   VITAL SIGNS:  Blood pressure (!) 159/61, pulse 72, temperature 97.8 F (36.6 C), temperature source Oral, resp. rate 17, height 5\' 2"  (1.575 m), weight 74.4 kg (164 lb), SpO2 95 %.  I/O:    Intake/Output Summary (Last 24 hours) at 12/15/16 0846 Last data filed at 12/15/16 0557  Gross per 24 hour  Intake              500 ml  Output             1535 ml  Net            -1035 ml    PHYSICAL EXAMINATION:  GENERAL:  83 y.o.-year-old patient lying in the bed with no acute distress.  EYES: Pupils equal, round, reactive to light and accommodation. No scleral icterus. Extraocular muscles intact.  HEENT: Head  atraumatic, normocephalic. Oropharynx and nasopharynx clear.  NECK:  Supple, no jugular venous distention. No thyroid enlargement, no tenderness.  LUNGS: Normal breath sounds bilaterally, no wheezing, rales,rhonchi or crepitation. No use of accessory muscles of respiration.  CARDIOVASCULAR: S1, S2 normal. No murmurs, rubs, or gallops. Left side of the anterior chest wall with pacemaker site is clean dressing,no edema ABDOMEN: Soft, non-tender, non-distended. Bowel sounds present. No organomegaly or mass.  EXTREMITIES: No pedal edema, cyanosis, or clubbing.  NEUROLOGIC: Cranial nerves II through XII are intact. Muscle strength 5/5 in all extremities. Sensation intact. Gait not checked.  PSYCHIATRIC: The patient is alert and oriented x 3.  SKIN: No obvious rash, lesion, or ulcer.   DATA REVIEW:   CBC  Recent Labs Lab 12/15/16 0504  WBC 7.4  HGB 13.8  HCT 40.4  PLT 235    Chemistries   Recent Labs Lab 12/08/16 2145  12/14/16 0518 12/15/16 0504  NA  --   < > 139  --   K  --   < > 4.2  --   CL  --   < > 107  --   CO2  --   < > 27  --   GLUCOSE  --   < > 91  --   BUN  --   < > 21*  --   CREATININE  --   < > 0.81 0.70  CALCIUM  --   < > 9.3  --   MG 2.0  --   --   --   < > = values in this interval not displayed.  Cardiac Enzymes  Recent Labs Lab 12/08/16 2145  TROPONINI 0.03*    Microbiology Results  Results for orders placed or performed in visit on 08/20/12  Urine culture     Status: None   Collection Time: 08/20/12  2:42 PM  Result Value Ref Range Status   Micro Text Report   Final       SOURCE: CLEAN CATCH    COMMENT                   NO GROWTH IN 36 HOURS   ANTIBIOTIC                                                        RADIOLOGY:  Dg Chest Port 1 View  Result Date: 12/14/2016 CLINICAL DATA:  Cardiac pacemaker placement EXAM: PORTABLE CHEST 1 VIEW COMPARISON:  08/29/2012 FINDINGS: Left subclavian transvenous duo lead  pacemaker has been placed with  leads in the right atrium and right ventricle region. No pneumothorax. Lungs are clear without acute abnormality. IMPRESSION: Satisfactory dual lead pacemaker placement.  No pneumothorax. Electronically Signed   By: Franchot Gallo M.D.   On: 12/14/2016 14:34   Dg C-arm 1-60 Min-no Report  Result Date: 12/14/2016 Fluoroscopy was utilized by the requesting physician.  No radiographic interpretation.    EKG:   Orders placed or performed during the hospital encounter of 12/08/16  . ED EKG  . ED EKG  . EKG 12-Lead  . EKG 12-Lead  . EKG 12-Lead in am (before 8am)  . EKG 12-Lead in am (before 8am)      Management plans discussed with the patient, family and they are in agreement.  CODE STATUS:     Code Status Orders        Start     Ordered   12/08/16 2215  Full code  Continuous     12/08/16 2214    Code Status History    Date Active Date Inactive Code Status Order ID Comments User Context   This patient has a current code status but no historical code status.    Advance Directive Documentation     Most Recent Value  Type of Advance Directive  Healthcare Power of Attorney  Pre-existing out of facility DNR order (yellow form or pink MOST form)  -  "MOST" Form in Place?  -      TOTAL TIME TAKING CARE OF THIS PATIENT: 43  minutes.   Note: This dictation was prepared with Dragon dictation along with smaller phrase technology. Any transcriptional errors that result from this process are unintentional.   @M[facility] @  on 12/15/2016 at 8:46 AM  Between 7am to 6pm - Pager - [phone]  After 6pm go to [email or web address] - password [email or web address] [facility]  Office  [phone]  CC: Primary care physician; Einar Pheasant, MD

## 2016-12-15 NOTE — Anesthesia Postprocedure Evaluation (Signed)
Anesthesia Post Note  Patient: Stellah Donovan Samano  Procedure(s) Performed: Procedure(s) (LRB): INSERTION PACEMAKER (Left)  Patient location during evaluation: PACU Anesthesia Type: General Level of consciousness: awake and alert Pain management: pain level controlled Vital Signs Assessment: post-procedure vital signs reviewed and stable Respiratory status: spontaneous breathing, nonlabored ventilation, respiratory function stable and patient connected to nasal cannula oxygen Cardiovascular status: blood pressure returned to baseline and stable Postop Assessment: no signs of nausea or vomiting Anesthetic complications: no     Last Vitals:  Vitals:   12/15/16 0357 12/15/16 0908  BP: (!) 159/61 (!) 106/46  Pulse: 72 (!) 58  Resp: 17 17  Temp: 36.6 C 36.5 C    Last Pain:  Vitals:   12/15/16 0958  TempSrc:   PainSc: 0-No pain                 Molli Barrows

## 2016-12-15 NOTE — Progress Notes (Signed)
Physical Therapy Treatment Patient Details Name: Kristin Avila MRN: [id number] DOB: 12-Apr-1928 Today's Date: 12/15/2016    History of Present Illness Patient is an 83 y/o female that presents after multiple syncopal episodes. She has a history of A-fib, CT scan was negative. She was in the kitchen during her episodes, does not recall what activities she was doing prior.     PT Comments    Pt in chair, ready to ambulate.  New pacemaker placed yesterday.  Educated pt on precautions and limiting LUE use and pushing with transfers.  Pt was able to stand with min assist to limit LUE push forces.  Once standing she was able to ambulate x 1 around unit with walker and min guard.  She was fatigued with gait and voiced being surprised at how weak she has become since admission.  Overall gait improved with walker and she is agreeable to using one at home.    Due to continued weakness from extended hospital stay, d/c recommendations changed to include HHPT to increase overall strength and balance as they continue to limit her mobility and activity tolerance.     Follow Up Recommendations  Home health PT     Equipment Recommendations  Rolling walker with 5" wheels    Recommendations for Other Services       Precautions / Restrictions Precautions Precautions: Fall;ICD/Pacemaker Restrictions Weight Bearing Restrictions: No    Mobility  Bed Mobility               General bed mobility comments: Patient seated in bedside recliner.   Transfers Overall transfer level: Modified independent Equipment used: Rolling walker (2 wheeled)             General transfer comment: education to restrict LUE movement and pushing with transfers  Ambulation/Gait Ambulation/Gait assistance: Min guard Ambulation Distance (Feet): 160 Feet Assistive device: Rolling walker (2 wheeled) Gait Pattern/deviations: Step-through pattern   Gait velocity interpretation: Below normal speed for  age/gender     Stairs            Wheelchair Mobility    Modified Rankin (Stroke Patients Only)       Balance Overall balance assessment: Needs assistance Sitting-balance support: Feet supported Sitting balance-Leahy Scale: Normal     Standing balance support: Single extremity supported Standing balance-Leahy Scale: Fair                              Cognition Arousal/Alertness: Awake/alert Behavior During Therapy: WFL for tasks assessed/performed Overall Cognitive Status: Within Functional Limits for tasks assessed                                        Exercises      General Comments        Pertinent Vitals/Pain Pain Assessment: No/denies pain    Home Living                      Prior Function            PT Goals (current goals can now be found in the care plan section) Progress towards PT goals: Progressing toward goals    Frequency    Min 2X/week      PT Plan Current plan remains appropriate    Co-evaluation  AM-PAC PT "6 Clicks" Daily Activity  Outcome Measure  Difficulty turning over in bed (including adjusting bedclothes, sheets and blankets)?: None Difficulty moving from lying on back to sitting on the side of the bed? : None Difficulty sitting down on and standing up from a chair with arms (e.g., wheelchair, bedside commode, etc,.)?: Total Help needed moving to and from a bed to chair (including a wheelchair)?: A Little Help needed walking in hospital room?: A Little Help needed climbing 3-5 steps with a railing? : A Little 6 Click Score: 18    End of Session Equipment Utilized During Treatment: Gait belt Activity Tolerance: Patient tolerated treatment well Patient left: in chair;with chair alarm set;with call bell/phone within reach         Time: 0845-0900 PT Time Calculation (min) (ACUTE ONLY): 15 min  Charges:  $Gait Training: 8-22 mins                    G  Codes:       Chesley Noon, PTA 12/15/16, 9:36 AM

## 2016-12-15 NOTE — Progress Notes (Signed)
       [facility]  SUBJECTIVE: No complaints. Pacemaker site clean and dry   Vitals:   12/14/16 1503 12/14/16 1504 12/14/16 2001 12/15/16 0357  BP:  (!) 154/58 (!) 106/47 (!) 159/61  Pulse: 60 62 70 72  Resp:   17 17  Temp:  97.8 F (36.6 C) 98.2 F (36.8 C) 97.8 F (36.6 C)  TempSrc:   Oral Oral  SpO2: 92%  96% 95%  Weight:      Height:        Intake/Output Summary (Last 24 hours) at 12/15/16 0709 Last data filed at 12/15/16 0557  Gross per 24 hour  Intake              500 ml  Output             1835 ml  Net            -1335 ml    LABS: Basic Metabolic Panel:  Recent Labs  12/14/16 0518 12/15/16 0504  NA 139  --   K 4.2  --   CL 107  --   CO2 27  --   GLUCOSE 91  --   BUN 21*  --   CREATININE 0.81 0.70  CALCIUM 9.3  --    Liver Function Tests: No results for input(s): AST, ALT, ALKPHOS, BILITOT, PROT, ALBUMIN in the last 72 hours. No results for input(s): LIPASE, AMYLASE in the last 72 hours. CBC:  Recent Labs  12/14/16 0518 12/15/16 0504  WBC 5.7 7.4  HGB 12.9 13.8  HCT 37.9 40.4  MCV 84.3 84.9  PLT 230 235   Cardiac Enzymes: No results for input(s): CKTOTAL, CKMB, CKMBINDEX, TROPONINI in the last 72 hours. BNP: Invalid input(s): POCBNP D-Dimer: No results for input(s): DDIMER in the last 72 hours. Hemoglobin A1C: No results for input(s): HGBA1C in the last 72 hours. Fasting Lipid Panel: No results for input(s): CHOL, HDL, LDLCALC, TRIG, CHOLHDL, LDLDIRECT in the last 72 hours. Thyroid Function Tests: No results for input(s): TSH, T4TOTAL, T3FREE, THYROIDAB in the last 72 hours.  Invalid input(s): FREET3 Anemia Panel: No results for input(s): VITAMINB12, FOLATE, FERRITIN, TIBC, IRON, RETICCTPCT in the last 72 hours.   Physical Exam: Blood pressure (!) 159/61, pulse 72, temperature 97.8 F (36.6 C), temperature source Oral, resp. rate 17, height 5\' 2"  (1.575 m), weight 74.4 kg (164 lb), SpO2  95 %.   Wt Readings from Last 1 Encounters:  12/14/16 74.4 kg (164 lb)     General appearance: alert and cooperative Resp: clear to auscultation bilaterally Cardio: regular rate and rhythm GI: soft, non-tender; bowel sounds normal; no masses,  no organomegaly Pulses: 2+ and symmetric Neurologic: Grossly normal Pacemaker site clean and dry TELEMETRY: Reviewed telemetry pt in nsr with intermittant  AV pace:  ASSESSMENT AND PLAN:  Active Problems:   Syncope-appears to have been to transient pauses. Had brief episodes of afib during hospitalization. Will defer any chronic anticoagulation for now due to fall risk. Would discharge on lisinopril 40 daily, pravastatin 20 mg daily, amlodipine 10 mg daily, and asa 81 mg daily. Follow up in 1 week. OK for discharge today    Teodoro Spray, MD, [facility] 12/15/2016 7:09 AM

## 2016-12-15 NOTE — Care Management (Signed)
For discharge home today.  Will require home health.  [facility] is in network with patient's medicare uhc.  Referral called and accepted

## 2016-12-15 NOTE — Discharge Instructions (Signed)
°  Outpatient follow-up with primary care physician in a week Outpatient follow-up with [facility] cardiology in  weeks

## 2016-12-16 ENCOUNTER — Telehealth: Payer: Self-pay | Admitting: Internal Medicine

## 2016-12-16 NOTE — Telephone Encounter (Signed)
Benjamine Mola from [facility] called and stated that they received a referral from the hospital for patient. She is looking for verbal orders for 2x week for cardiovascular education and medication management. Please advise, thank you!  Call @ [phone]

## 2016-12-16 NOTE — Telephone Encounter (Signed)
Home health out with patient today and stated patient vitals and 02 sats were all fine patient denies chest and SOB now, stated she does not remember telling granddaughter she was having chest pain . Patient stated she feels good ., no SOB noted in conversation with nurse. Patient has an up coming appointment with cardiology but could not tell nurse when, checked Care Every Where patient sees Dr. Nehemiah Massed, but I see no upcoming appointment scheduled. Patient refused to be evaluated today.

## 2016-12-16 NOTE — Telephone Encounter (Signed)
Please advise 

## 2016-12-16 NOTE — Telephone Encounter (Signed)
Concerned patient refused ER for chest pain with SOB last night 12/15/16 no complaint today of chest pain. Advised family to call cardiology and advised of symptoms last night patient daughter was reported to nurse calling cardiology while on phone with granddaughter. Patient complaint today general malaise and feels she aches all over. TCM has been made for HFU need appointment time to schedule.

## 2016-12-16 NOTE — Telephone Encounter (Signed)
Ok

## 2016-12-16 NOTE — Telephone Encounter (Signed)
Juliann Pulse spoke to pt.  She is feeling fine and declines evaluation now.  States needs to schedule hospital f/u.  Please hold for cancellation.  If no cancellation, then schedule for next Wednesday 12/23/16 at 1:00.  Thanks

## 2016-12-16 NOTE — Telephone Encounter (Signed)
Notified of verbal orders

## 2016-12-16 NOTE — Telephone Encounter (Signed)
Agree with notifying cardiology.  Do agree with need for evaluation now if chest pain and sob given recent pacemaker insertion.

## 2016-12-16 NOTE — Telephone Encounter (Signed)
Transition Care Management Follow-up Telephone Call  How have you been since you were released from the hospital? Patient not feeling well per granddaughter, stated patient  Just does not feel well, aches all over.   Do you understand why you were in the hospital? Yes, (granddaughter)   Do you understand the discharge instrcutions? Yes,( granddaughter)  Items Reviewed:  Medications reviewed: Yes  Allergies reviewed: yes  Dietary changes reviewed:Yes, Heart healthy.  Referrals reviewed: Yes,   Functional Questionnaire:   Activities of Daily Living (ADLs):   She states they are independent in the following Independent in ADLs States they require assistance with the following: no ,assist per granddaughter.   Any transportation issues/concerns?:no   Any patient concerns? Yes, Granddaughter concerned patient complained of SOB last night 12/15/16 but refused to return to ER,  Complaint of chest pain during SOB, Tried to urge patient to return to ER for evaluation patient refused but a greed to call cardiology , daughter on phone with cardiology while on phone.   Confirmed importance and date/time of follow-up visits scheduled: Yes,  Confirmed with patient if condition begins to worsen call PCP or go to the ER.  Patient was given the Call-a-Nurse line [phone]: Yes.

## 2016-12-17 NOTE — Telephone Encounter (Signed)
Pt scheduled  

## 2016-12-23 ENCOUNTER — Encounter: Payer: Self-pay | Admitting: Internal Medicine

## 2016-12-23 ENCOUNTER — Ambulatory Visit (INDEPENDENT_AMBULATORY_CARE_PROVIDER_SITE_OTHER): Payer: Medicare Other | Admitting: Internal Medicine

## 2016-12-23 ENCOUNTER — Telehealth: Payer: Self-pay

## 2016-12-23 VITALS — BP 140/86 | HR 100 | Temp 98.6°F | Resp 12 | Ht 62.0 in | Wt 169.0 lb

## 2016-12-23 DIAGNOSIS — I495 Sick sinus syndrome: Secondary | ICD-10-CM | POA: Diagnosis not present

## 2016-12-23 DIAGNOSIS — E7800 Pure hypercholesterolemia, unspecified: Secondary | ICD-10-CM

## 2016-12-23 DIAGNOSIS — R55 Syncope and collapse: Secondary | ICD-10-CM

## 2016-12-23 DIAGNOSIS — I4891 Unspecified atrial fibrillation: Secondary | ICD-10-CM

## 2016-12-23 DIAGNOSIS — I10 Essential (primary) hypertension: Secondary | ICD-10-CM

## 2016-12-23 DIAGNOSIS — F419 Anxiety disorder, unspecified: Secondary | ICD-10-CM | POA: Diagnosis not present

## 2016-12-23 DIAGNOSIS — R000 Tachycardia, unspecified: Secondary | ICD-10-CM

## 2016-12-23 MED ORDER — METOPROLOL SUCCINATE ER 50 MG PO TB24: 50.0000 mg | ORAL_TABLET | Freq: Every day | ORAL | 1 refills | Status: DC

## 2016-12-23 NOTE — Progress Notes (Signed)
Pre-visit discussion using our clinic review tool. No additional management support is needed unless otherwise documented below in the visit note.  

## 2016-12-23 NOTE — Progress Notes (Signed)
Patient ID: Kristin Avila, female   DOB: 1928/07/14, 83 y.o.   MRN: [id number]   Subjective:    Patient ID: Kristin Avila, female    DOB: 1928-02-07, 83 y.o.   MRN: [id number]  HPI  Patient here for hospital follow up.  She was admitted 12/08/16 with recurrent syncope felt to be sick sinus syndrome and orthostatic hypotension.  Was monitored on telemetry and found to have 3.5-4.2 second pauses.  S/p pacemaker placement on 12/14/16.  Tolerated the procedure well.  She was given IVFs.  Blood pressure improved.  Was found to have brief episodes of atrial fib during the hospitalization.  Deferred chronic anticoagulation secondary to fall risk.  She was discharged home on 42m aspirin.  She comes in today stating she report some fatigue.  No chest pain.  No further syncopal episodes.  Breathing stable.  No increased heart rate or palpitations.  No nausea or vomiting.  Bowels moving.  No abdominal pain.  Eating and drinking.     Past Medical History:  Diagnosis Date  . Anemia   . Atrial fibrillation ([place] 11/13   new onset 11/13  . Hypercholesterolemia   . Hypertension   . Osteoarthritis    hands, feet   Past Surgical History:  Procedure Laterality Date  . APPENDECTOMY  1957  . CATARACT EXTRACTION  2013   left eye  . KIDNEY SURGERY  age 83 . PACEMAKER INSERTION Left 12/14/2016   Procedure: INSERTION PACEMAKER;  Surgeon: PIsaias Cowman MD;  Location: [facility] ORS;  Service: Cardiovascular;  Laterality: Left;  . TUBAL LIGATION     Family History  Problem Relation Age of Onset  . Congestive Heart Failure Father   . Multiple myeloma Mother   . Hypertension Brother   . Alzheimer's disease Unknown        grandmother and great aunts  . Breast cancer Neg Hx   . Colon cancer Neg Hx    Social History   Social History  . Marital status: Widowed    Spouse name: N/A  . Number of children: 4  . Years of education: N/A   Social History Main Topics  . Smoking status: Never Smoker  .  Smokeless tobacco: Never Used  . Alcohol use No     Comment: occasional  . Drug use: No  . Sexual activity: Not Asked   Other Topics Concern  . None   Social History Narrative  . None    Outpatient Encounter Prescriptions as of 12/23/2016  Medication Sig  . acetaminophen (TYLENOL) 325 MG tablet Take 2 tablets (650 mg total) by mouth every 6 (six) hours as needed for mild pain, moderate pain or fever (or Fever >/= 101).  .Marland KitchenamLODipine (NORVASC) 10 MG tablet Take 1 tablet (10 mg total) by mouth daily.  .Marland Kitchenaspirin 81 MG tablet Take 81 mg by mouth daily.  . Cholecalciferol (VITAMIN D-3) 1000 UNITS CAPS Take 1 capsule by mouth daily.   .Marland KitchenFLUoxetine (PROZAC) 40 MG capsule Take 1 capsule (40 mg total) by mouth daily.  .Marland Kitchenlisinopril (PRINIVIL,ZESTRIL) 40 MG tablet TAKE ONE TABLET BY MOUTH ONCE DAILY  . lovastatin (MEVACOR) 20 MG tablet TAKE ONE TABLET BY MOUTH ONCE DAILY  . naproxen sodium (ANAPROX) 220 MG tablet Take 220 mg by mouth 2 (two) times daily with a meal.  . senna-docusate (SENOKOT-S) 8.6-50 MG tablet Take 1 tablet by mouth at bedtime as needed for mild constipation.  . traZODone (DESYREL) 100 MG tablet Take 1  tablet (100 mg total) by mouth at bedtime.  . [DISCONTINUED] HYDROcodone-acetaminophen (NORCO/VICODIN) 5-325 MG tablet Take 1 tablet by mouth every 6 (six) hours as needed for severe pain.  . metoprolol succinate (TOPROL-XL) 50 MG 24 hr tablet Take 1 tablet (50 mg total) by mouth daily. Take with or immediately following a meal.   No facility-administered encounter medications on file as of 12/23/2016.     Review of Systems  Constitutional: Positive for fatigue. Negative for appetite change.  HENT: Negative for congestion and sinus pressure.   Respiratory: Negative for cough, chest tightness and shortness of breath.   Cardiovascular: Negative for chest pain, palpitations and leg swelling.  Gastrointestinal: Negative for abdominal pain, diarrhea, nausea and vomiting.    Genitourinary: Negative for difficulty urinating and dysuria.  Musculoskeletal: Negative for back pain and joint swelling.  Skin: Negative for color change and rash.  Neurological: Negative for dizziness, light-headedness and headaches.  Psychiatric/Behavioral: Negative for agitation and dysphoric mood.       Objective:    Physical Exam  Constitutional: She appears well-developed and well-nourished. No distress.  HENT:  Nose: Nose normal.  Mouth/Throat: Oropharynx is clear and moist.  Neck: Neck supple. No thyromegaly present.  Cardiovascular:  Ventricular rate 120.    Pulmonary/Chest: Breath sounds normal. No respiratory distress. She has no wheezes.  Abdominal: Soft. Bowel sounds are normal. There is no tenderness.  Musculoskeletal: She exhibits no edema or tenderness.  Lymphadenopathy:    She has no cervical adenopathy.  Skin: No rash noted. No erythema.  Psychiatric: She has a normal mood and affect. Her behavior is normal.    BP 140/86 (BP Location: Left Arm, Patient Position: Sitting, Cuff Size: Normal)   Pulse 100   Temp 98.6 F (37 C) (Oral)   Resp 12   Ht _0  (1.575 m)   Wt 169 lb (76.7 kg)   SpO2 97%   BMI 30.91 kg/m  Wt Readings from Last 3 Encounters:  12/23/16 169 lb (76.7 kg)  12/14/16 164 lb (74.4 kg)  11/26/16 164 lb (74.4 kg)     Lab Results  Component Value Date   WBC 7.4 12/15/2016   HGB 13.8 12/15/2016   HCT 40.4 12/15/2016   PLT 235 12/15/2016   GLUCOSE 91 12/14/2016   CHOL 219 (H) 11/26/2016   TRIG 68.0 11/26/2016   HDL 91.90 11/26/2016   LDLDIRECT 105.8 09/06/2013   LDLCALC 113 (H) 11/26/2016   ALT 10 11/26/2016   AST 20 11/26/2016   NA 139 12/14/2016   K 4.2 12/14/2016   CL 107 12/14/2016   CREATININE 0.70 12/15/2016   BUN 21 (H) 12/14/2016   CO2 27 12/14/2016   TSH 1.69 11/26/2016    Mr Brain Wo Contrast  Result Date: 12/10/2016 CLINICAL DATA:  Syncope EXAM: MRI HEAD WITHOUT CONTRAST TECHNIQUE: Multiplanar, multiecho  pulse sequences of the brain and surrounding structures were obtained without intravenous contrast. COMPARISON:  None. FINDINGS: Brain: No acute infarction, hemorrhage, hydrocephalus, extra-axial collection or mass lesion. Normal brain volume for age. Few FLAIR hyperintensities in the cerebral white matter, expected. Two possible remote micro hemorrhages in the right inferior cerebrum, nonspecific and consider incidental in isolation. Vascular: Major flow voids are preserved, including in the vertebrobasilar circulation. Skull and upper cervical spine: Mild facet spurring. No evidence of marrow lesion or injury. Sinuses/Orbits: Bilateral cataract resection.  No acute finding IMPRESSION: Unremarkable brain MRI for age. Electronically Signed   By: Monte Fantasia M.D.   On: 12/10/2016 13:45  US Carotid Bilateral  Result Date: 12/09/2016 CLINICAL DATA:  Syncope. EXAM: BILATERAL CAROTID DUPLEX ULTRASOUND TECHNIQUE: Pearline Cables scale imaging, color Doppler and duplex ultrasound were performed of bilateral carotid and vertebral arteries in the neck. COMPARISON:  08/20/2012 FINDINGS: Criteria: Quantification of carotid stenosis is based on velocity parameters that correlate the residual internal carotid diameter with NASCET-based stenosis levels, using the diameter of the distal internal carotid lumen as the denominator for stenosis measurement. The following velocity measurements were obtained: RIGHT ICA:  76 cm/sec CCA:  88 cm/sec SYSTOLIC ICA/CCA RATIO:  0.9 DIASTOLIC ICA/CCA RATIO:  1.5 ECA:  106 cm/sec LEFT ICA:  74 cm/sec CCA:  82 cm/sec SYSTOLIC ICA/CCA RATIO:  0.9 DIASTOLIC ICA/CCA RATIO:  0.8 ECA:  111 cm/sec RIGHT CAROTID ARTERY: Mild disease in the right common carotid artery. External carotid artery is patent with normal waveform. Moderate plaque at the carotid bulb and proximal internal carotid artery. Normal waveforms and velocities in the internal carotid artery. RIGHT VERTEBRAL ARTERY: Antegrade flow and  normal waveform in the right vertebral artery. LEFT CAROTID ARTERY: Mild disease in left common carotid artery. Small amount of echogenic plaque at the left carotid bulb. External carotid artery is patent with normal waveform. Normal waveforms and velocities in the internal carotid artery. LEFT VERTEBRAL ARTERY: Antegrade flow and normal waveform in the left vertebral artery. IMPRESSION: Mild to moderate atherosclerotic disease in the carotid arteries, most prominent in the proximal right internal carotid artery. Estimated degree of stenosis in the internal carotid arteries is less than 50% bilaterally. Patent vertebral arteries with antegrade flow. Electronically Signed   By: Markus Daft M.D.   On: 12/09/2016 17:14       Assessment & Plan:   Problem List Items Addressed This Visit    Anxiety    On prozac.  Stable.        Atrial fibrillation ([place])    Appears to be in afib/aflutter now.  Increased ventricular rate.  Tolerating.  Some increased fatigue.  Discussed with cardiology.  Start metoprolol 29m q day.  She has an appt tomorrow with cardiology.  Will keep.  Discussed anticoagulation.  Will d/w cardiology.  If any change or worsening problems, she is to be evaluated immediately.        Relevant Medications   metoprolol succinate (TOPROL-XL) 50 MG 24 hr tablet   Hypercholesterolemia    On lovastatin.  Low cholesterol diet and exercise.  Follow lipid panel and liver function tests.        Relevant Medications   metoprolol succinate (TOPROL-XL) 50 MG 24 hr tablet   Hypertension    Blood pressure under good control.  Continue same medication regimen.  Follow pressures.  Follow metabolic panel.        Relevant Medications   metoprolol succinate (TOPROL-XL) 50 MG 24 hr tablet   Sick sinus syndrome ([facility]    Admitted with syncope.  Found to have sick sinus syndrome.  S/p pacemaker placement.  Keep appt with cardiology as outlined.        Relevant Medications   metoprolol succinate  (TOPROL-XL) 50 MG 24 hr tablet   Syncope    Recurring syncope.  Found to have sick sinus syndrome.  S/p pacemaker placement.  F/u with cardiology tomorrow as outlined.        Relevant Medications   metoprolol succinate (TOPROL-XL) 50 MG 24 hr tablet    Other Visit Diagnoses    Tachycardia    -  Primary   Relevant Orders  EKG 12-Lead (Completed)       Einar Pheasant, MD

## 2016-12-23 NOTE — Telephone Encounter (Signed)
I have gave the patient this appointment time/date can you put her in for 02-17-17 at 1:00

## 2016-12-24 NOTE — Telephone Encounter (Signed)
Patient has been scheduled make sure patient aware.

## 2016-12-24 NOTE — Telephone Encounter (Signed)
Per message patient aware

## 2016-12-31 ENCOUNTER — Ambulatory Visit: Payer: Medicare Other

## 2017-01-01 DIAGNOSIS — R2689 Other abnormalities of gait and mobility: Secondary | ICD-10-CM | POA: Diagnosis not present

## 2017-01-01 DIAGNOSIS — Z950 Presence of cardiac pacemaker: Secondary | ICD-10-CM | POA: Diagnosis not present

## 2017-01-01 DIAGNOSIS — M6281 Muscle weakness (generalized): Secondary | ICD-10-CM | POA: Diagnosis not present

## 2017-01-01 DIAGNOSIS — Z48812 Encounter for surgical aftercare following surgery on the circulatory system: Secondary | ICD-10-CM | POA: Diagnosis not present

## 2017-01-01 DIAGNOSIS — Z9181 History of falling: Secondary | ICD-10-CM | POA: Diagnosis not present

## 2017-01-01 DIAGNOSIS — I4891 Unspecified atrial fibrillation: Secondary | ICD-10-CM | POA: Diagnosis not present

## 2017-01-01 DIAGNOSIS — I10 Essential (primary) hypertension: Secondary | ICD-10-CM | POA: Diagnosis not present

## 2017-01-01 DIAGNOSIS — F419 Anxiety disorder, unspecified: Secondary | ICD-10-CM | POA: Diagnosis not present

## 2017-01-01 DIAGNOSIS — M1990 Unspecified osteoarthritis, unspecified site: Secondary | ICD-10-CM | POA: Diagnosis not present

## 2017-01-04 ENCOUNTER — Encounter: Payer: Self-pay | Admitting: Internal Medicine

## 2017-01-04 DIAGNOSIS — I495 Sick sinus syndrome: Secondary | ICD-10-CM

## 2017-01-04 HISTORY — DX: Sick sinus syndrome: I49.5

## 2017-01-04 NOTE — Assessment & Plan Note (Signed)
On lovastatin.  Low cholesterol diet and exercise.  Follow lipid panel and liver function tests.   

## 2017-01-04 NOTE — Assessment & Plan Note (Signed)
Admitted with syncope.  Found to have sick sinus syndrome.  S/p pacemaker placement.  Keep appt with cardiology as outlined.

## 2017-01-04 NOTE — Assessment & Plan Note (Signed)
On prozac.  Stable.   

## 2017-01-04 NOTE — Assessment & Plan Note (Signed)
Recurring syncope.  Found to have sick sinus syndrome.  S/p pacemaker placement.  F/u with cardiology tomorrow as outlined.

## 2017-01-04 NOTE — Assessment & Plan Note (Signed)
Appears to be in afib/aflutter now.  Increased ventricular rate.  Tolerating.  Some increased fatigue.  Discussed with cardiology.  Start metoprolol 50mg  q day.  She has an appt tomorrow with cardiology.  Will keep.  Discussed anticoagulation.  Will d/w cardiology.  If any change or worsening problems, she is to be evaluated immediately.

## 2017-01-04 NOTE — Assessment & Plan Note (Signed)
Blood pressure under good control.  Continue same medication regimen.  Follow pressures.  Follow metabolic panel.   

## 2017-01-15 ENCOUNTER — Telehealth: Payer: Self-pay | Admitting: Internal Medicine

## 2017-01-15 NOTE — Telephone Encounter (Signed)
Reason for call: ankle swelling bilateral  Symptoms: ankles swelling , Home Health RN advised to call cardiologist due out of office they directed patient   to call PCP,  Daughter is not with her, Home Health RN instructed her to elevate her feet when sitting , vitals were good per [place] at visit today, She is able to ambulate around, she is driving around today, No shortness of breath per daughter , she is not with patient, Patient is supposed to be going out of town tomorrow  Duration 1 week  Medications:  Last seen for this problem: Seen by: NOV 02/17/17 Dr Nicki Reaper  Daughter Arbie Cookey [phone]  Please direct , Thanks

## 2017-01-15 NOTE — Telephone Encounter (Signed)
Confirm with pt that no swelling extending up the legs.  No redness or warmth.  If acute swelling or any other symptoms, needs to be evaluated.  Was just recently admitted to hospital.  Elevate legs.  Avoid increased salt intake.

## 2017-01-15 NOTE — Telephone Encounter (Signed)
On 01/11/17 reported, by Home Health RN  bilateral ankle swelling . 01/14/17 left ankle yesterday.   No redness or warmth going up the legs.  She is elevating the legs when sitting per daughter.   Advised to elevated legs and avoid increased salt intake.  Advised needed to get swelling addressed at urgent care.

## 2017-01-15 NOTE — Telephone Encounter (Signed)
Noted.  Please f/u with pt.  Thanks

## 2017-01-15 NOTE — Telephone Encounter (Signed)
Ankles are swelling . Patient's daughter called to speak to Dr. Nehemiah Massed (or his nurse and both of them are out of the office. The daughter was told to call Dr. Nicki Reaper.

## 2017-01-15 NOTE — Telephone Encounter (Signed)
Spoke with Arbie Cookey daughter and she stated she spoke with [facility], and they reported left ankle swelling yesterday , and on 01/11/17 feet swelling.   She will check back with Home Health RN and call back  With more information.

## 2017-01-15 NOTE — Telephone Encounter (Signed)
Patient's daughter returned your call.  

## 2017-01-18 ENCOUNTER — Telehealth: Payer: Self-pay | Admitting: Internal Medicine

## 2017-01-18 ENCOUNTER — Other Ambulatory Visit: Payer: Self-pay

## 2017-01-18 MED ORDER — FLUOXETINE HCL 40 MG PO CAPS: 40.0000 mg | ORAL_CAPSULE | Freq: Every day | ORAL | 1 refills | Status: AC

## 2017-01-18 MED ORDER — METOPROLOL SUCCINATE ER 50 MG PO TB24: 50.0000 mg | ORAL_TABLET | Freq: Every day | ORAL | 1 refills | Status: DC

## 2017-01-18 MED ORDER — LISINOPRIL 40 MG PO TABS: 40.0000 mg | ORAL_TABLET | Freq: Every day | ORAL | 1 refills | Status: DC

## 2017-01-18 MED ORDER — TRAZODONE HCL 100 MG PO TABS: 100.0000 mg | ORAL_TABLET | Freq: Every day | ORAL | 1 refills | Status: DC

## 2017-01-18 MED ORDER — LOVASTATIN 20 MG PO TABS: 20.0000 mg | ORAL_TABLET | Freq: Every day | ORAL | 1 refills | Status: DC

## 2017-01-18 MED ORDER — AMLODIPINE BESYLATE 10 MG PO TABS: 10.0000 mg | ORAL_TABLET | Freq: Every day | ORAL | 1 refills | Status: DC

## 2017-01-18 NOTE — Telephone Encounter (Signed)
Spoke with Kristin Avila daughter, patient refused to go to walk in clinic . Patient elevated her feet this weekend swelling went down on Friday per her  grandson.   Patient will be coming back in town tonight.

## 2017-01-18 NOTE — Telephone Encounter (Signed)
Left voice mail to call back for Kristin Avila daughter

## 2017-01-18 NOTE — Telephone Encounter (Signed)
Ok to change all scripts to new pharmacy

## 2017-01-18 NOTE — Telephone Encounter (Signed)
Noted.  Let us know if persistent problems with swelling or any other issues.

## 2017-01-18 NOTE — Telephone Encounter (Signed)
Pt daughter called back returning your call. Please advise, thank you!  Call [facility] @ [phone]

## 2017-01-18 NOTE — Telephone Encounter (Signed)
Sent scripts over l/m for patient to let her know

## 2017-01-18 NOTE — Telephone Encounter (Signed)
Danielle called from [facility] pt needing a refill. This is a new pharmacy. Pt is aware of this pharmacy.  traZODone (DESYREL) 100 MG tablet lovastatin (MEVACOR) 20 MG tablet lisinopril (PRINIVIL,ZESTRIL) 40 MG tablet FLUoxetine (PROZAC) 40 MG capsule amLODipine (NORVASC) 10 MG tablet metoprolol succinate (TOPROL-XL) 50 MG 24 hr tablet  Thank you!

## 2017-01-18 NOTE — Telephone Encounter (Signed)
Ok to refill.  I assume this is a mail order.  If so, ok each #90 with one refill.

## 2017-01-19 ENCOUNTER — Other Ambulatory Visit: Payer: Self-pay | Admitting: Internal Medicine

## 2017-01-21 ENCOUNTER — Other Ambulatory Visit: Payer: Self-pay

## 2017-01-21 ENCOUNTER — Telehealth: Payer: Self-pay | Admitting: Internal Medicine

## 2017-01-21 MED ORDER — AMLODIPINE BESYLATE 10 MG PO TABS: 10.0000 mg | ORAL_TABLET | Freq: Every day | ORAL | 1 refills | Status: DC

## 2017-01-21 NOTE — Telephone Encounter (Signed)
Pt called requesting a refill on her amLODipine (NORVASC) 10 MG tablet. She is completely out of medication and [facility] has not sent out her rx. Please advise, thank you!  Pharmacy - [id number] - [place], [place] - [address]

## 2017-01-21 NOTE — Telephone Encounter (Signed)
Pharmacy has not received this -per granddaughter  [facility] verbal if possible [phone]

## 2017-01-21 NOTE — Telephone Encounter (Signed)
Called and gave verbal to lisa

## 2017-01-21 NOTE — Progress Notes (Unsigned)
Sent to local pharmacy.

## 2017-01-21 NOTE — Telephone Encounter (Signed)
Has been faxed I called and let them know.

## 2017-01-21 NOTE — Telephone Encounter (Signed)
Patient 's granddaughter is requesting to have amlodopine refilled and [facility] [address]

## 2017-01-27 ENCOUNTER — Inpatient Hospital Stay
Admission: EM | Admit: 2017-01-27 | Discharge: 2017-01-31 | DRG: 603 | Disposition: A | Discharge status: Home / Self Care | Payer: Medicare Other | Attending: Specialist | Admitting: Specialist

## 2017-01-27 ENCOUNTER — Encounter: Payer: Self-pay | Admitting: *Deleted

## 2017-01-27 DIAGNOSIS — L02413 Cutaneous abscess of right upper limb: Secondary | ICD-10-CM | POA: Diagnosis present

## 2017-01-27 DIAGNOSIS — L03113 Cellulitis of right upper limb: Secondary | ICD-10-CM | POA: Diagnosis present

## 2017-01-27 DIAGNOSIS — Z7902 Long term (current) use of antithrombotics/antiplatelets: Secondary | ICD-10-CM

## 2017-01-27 DIAGNOSIS — I10 Essential (primary) hypertension: Secondary | ICD-10-CM | POA: Diagnosis present

## 2017-01-27 DIAGNOSIS — Z79899 Other long term (current) drug therapy: Secondary | ICD-10-CM

## 2017-01-27 DIAGNOSIS — F329 Major depressive disorder, single episode, unspecified: Secondary | ICD-10-CM | POA: Diagnosis present

## 2017-01-27 DIAGNOSIS — I959 Hypotension, unspecified: Secondary | ICD-10-CM | POA: Diagnosis present

## 2017-01-27 DIAGNOSIS — Z8249 Family history of ischemic heart disease and other diseases of the circulatory system: Secondary | ICD-10-CM

## 2017-01-27 DIAGNOSIS — Z7901 Long term (current) use of anticoagulants: Secondary | ICD-10-CM

## 2017-01-27 DIAGNOSIS — I4891 Unspecified atrial fibrillation: Secondary | ICD-10-CM | POA: Diagnosis present

## 2017-01-27 DIAGNOSIS — M1990 Unspecified osteoarthritis, unspecified site: Secondary | ICD-10-CM | POA: Diagnosis present

## 2017-01-27 DIAGNOSIS — D649 Anemia, unspecified: Secondary | ICD-10-CM | POA: Diagnosis present

## 2017-01-27 DIAGNOSIS — E785 Hyperlipidemia, unspecified: Secondary | ICD-10-CM | POA: Diagnosis present

## 2017-01-27 DIAGNOSIS — Z950 Presence of cardiac pacemaker: Secondary | ICD-10-CM | POA: Diagnosis not present

## 2017-01-27 DIAGNOSIS — Z7982 Long term (current) use of aspirin: Secondary | ICD-10-CM | POA: Diagnosis not present

## 2017-01-27 DIAGNOSIS — E7800 Pure hypercholesterolemia, unspecified: Secondary | ICD-10-CM | POA: Diagnosis present

## 2017-01-27 DIAGNOSIS — W19XXXA Unspecified fall, initial encounter: Secondary | ICD-10-CM | POA: Diagnosis present

## 2017-01-27 HISTORY — DX: Cellulitis of right upper limb: L03.113

## 2017-01-27 LAB — LACTIC ACID, PLASMA: Lactic Acid, Venous: 0.8 mmol/L (ref 0.5–1.9)

## 2017-01-27 LAB — URINALYSIS, COMPLETE (UACMP) WITH MICROSCOPIC
BILIRUBIN URINE: NEGATIVE
GLUCOSE, UA: NEGATIVE mg/dL
Hgb urine dipstick: NEGATIVE
KETONES UR: 15 mg/dL — AB
Nitrite: NEGATIVE
PH: 6.5 (ref 5.0–8.0)
Protein, ur: NEGATIVE mg/dL
Specific Gravity, Urine: 1.015 (ref 1.005–1.030)

## 2017-01-27 LAB — CBC WITH DIFFERENTIAL/PLATELET
BASOS PCT: 1 %
Basophils Absolute: 0.1 10*3/uL (ref 0–0.1)
Eosinophils Absolute: 0.1 10*3/uL (ref 0–0.7)
Eosinophils Relative: 1 %
HEMATOCRIT: 37.6 % (ref 35.0–47.0)
HEMOGLOBIN: 12.7 g/dL (ref 12.0–16.0)
LYMPHS PCT: 9 %
Lymphs Abs: 1.2 10*3/uL (ref 1.0–3.6)
MCH: 28.1 pg (ref 26.0–34.0)
MCHC: 33.6 g/dL (ref 32.0–36.0)
MCV: 83.6 fL (ref 80.0–100.0)
MONOS PCT: 11 %
Monocytes Absolute: 1.4 10*3/uL — ABNORMAL HIGH (ref 0.2–0.9)
NEUTROS ABS: 10.7 10*3/uL — AB (ref 1.4–6.5)
NEUTROS PCT: 78 %
Platelets: 238 10*3/uL (ref 150–440)
RBC: 4.5 MIL/uL (ref 3.80–5.20)
RDW: 15.7 % — ABNORMAL HIGH (ref 11.5–14.5)
WBC: 13.5 10*3/uL — ABNORMAL HIGH (ref 3.6–11.0)

## 2017-01-27 LAB — COMPREHENSIVE METABOLIC PANEL
ALT: 15 U/L (ref 14–54)
ANION GAP: 6 (ref 5–15)
AST: 24 U/L (ref 15–41)
Albumin: 3.6 g/dL (ref 3.5–5.0)
Alkaline Phosphatase: 85 U/L (ref 38–126)
BILIRUBIN TOTAL: 1 mg/dL (ref 0.3–1.2)
BUN: 22 mg/dL — ABNORMAL HIGH (ref 6–20)
CO2: 25 mmol/L (ref 22–32)
Calcium: 8.7 mg/dL — ABNORMAL LOW (ref 8.9–10.3)
Chloride: 104 mmol/L (ref 101–111)
Creatinine, Ser: 0.8 mg/dL (ref 0.44–1.00)
GFR calc Af Amer: >60 mL/min (ref >60)
Glucose, Bld: 107 mg/dL — ABNORMAL HIGH (ref 65–99)
POTASSIUM: 4 mmol/L (ref 3.5–5.1)
Sodium: 135 mmol/L (ref 135–145)
TOTAL PROTEIN: 6.5 g/dL (ref 6.5–8.1)

## 2017-01-27 LAB — PROTIME-INR
INR: 1.12
Prothrombin Time: 14.5 seconds (ref 11.4–15.2)

## 2017-01-27 MED ORDER — IPRATROPIUM BROMIDE 0.02 % IN SOLN: 0.5000 mg | Freq: Four times a day (QID) | RESPIRATORY_TRACT | Status: DC | PRN

## 2017-01-27 MED ORDER — SENNOSIDES-DOCUSATE SODIUM 8.6-50 MG PO TABS: 1.0000 | ORAL_TABLET | Freq: Every evening | ORAL | Status: DC | PRN

## 2017-01-27 MED ORDER — APIXABAN 5 MG PO TABS
5.0000 mg | ORAL_TABLET | Freq: Two times a day (BID) | ORAL | Status: DC
  Administered 2017-01-28 – 2017-01-31 (×7): 5 mg via ORAL
  Filled 2017-01-27 (×7): qty 1

## 2017-01-27 MED ORDER — ACETAMINOPHEN 500 MG PO TABS
1000.0000 mg | ORAL_TABLET | Freq: Once | ORAL | Status: AC
  Administered 2017-01-27: 1000 mg via ORAL
  Filled 2017-01-27: qty 2

## 2017-01-27 MED ORDER — ALBUTEROL SULFATE (2.5 MG/3ML) 0.083% IN NEBU: 2.5000 mg | INHALATION_SOLUTION | Freq: Four times a day (QID) | RESPIRATORY_TRACT | Status: DC | PRN

## 2017-01-27 MED ORDER — TRAZODONE HCL 100 MG PO TABS
100.0000 mg | ORAL_TABLET | Freq: Every day | ORAL | Status: DC
  Administered 2017-01-28 – 2017-01-30 (×4): 100 mg via ORAL
  Filled 2017-01-27 (×5): qty 1

## 2017-01-27 MED ORDER — SODIUM CHLORIDE 0.9 % IV BOLUS (SEPSIS)
1000.0000 mL | Freq: Once | INTRAVENOUS | Status: AC
  Administered 2017-01-27: 1000 mL via INTRAVENOUS

## 2017-01-27 MED ORDER — ACETAMINOPHEN 325 MG PO TABS: 650.0000 mg | ORAL_TABLET | Freq: Four times a day (QID) | ORAL | Status: DC | PRN

## 2017-01-27 MED ORDER — BISACODYL 5 MG PO TBEC: 5.0000 mg | DELAYED_RELEASE_TABLET | Freq: Every day | ORAL | Status: DC | PRN

## 2017-01-27 MED ORDER — NAPROXEN SODIUM 220 MG PO TABS: 220.0000 mg | ORAL_TABLET | Freq: Two times a day (BID) | ORAL | Status: DC

## 2017-01-27 MED ORDER — CEFAZOLIN SODIUM-DEXTROSE 1-4 GM/50ML-% IV SOLN
1.0000 g | Freq: Three times a day (TID) | INTRAVENOUS | Status: DC
  Administered 2017-01-28 – 2017-01-31 (×10): 1 g via INTRAVENOUS
  Filled 2017-01-27 (×12): qty 50

## 2017-01-27 MED ORDER — VANCOMYCIN HCL 10 G IV SOLR
1250.0000 mg | INTRAVENOUS | Status: DC
  Administered 2017-01-28 – 2017-01-31 (×4): 1250 mg via INTRAVENOUS
  Filled 2017-01-27 (×4): qty 1250

## 2017-01-27 MED ORDER — OXYCODONE HCL 5 MG PO TABS: 5.0000 mg | ORAL_TABLET | ORAL | Status: DC | PRN

## 2017-01-27 MED ORDER — MAGNESIUM CITRATE PO SOLN
1.0000 | Freq: Once | ORAL | Status: DC | PRN
  Filled 2017-01-27: qty 296

## 2017-01-27 MED ORDER — CEFAZOLIN SODIUM-DEXTROSE 1-4 GM/50ML-% IV SOLN
1.0000 g | Freq: Once | INTRAVENOUS | Status: AC
  Administered 2017-01-27: 1 g via INTRAVENOUS
  Filled 2017-01-27: qty 50

## 2017-01-27 MED ORDER — AMLODIPINE BESYLATE 10 MG PO TABS
10.0000 mg | ORAL_TABLET | Freq: Every day | ORAL | Status: DC
  Filled 2017-01-27: qty 1

## 2017-01-27 MED ORDER — ONDANSETRON HCL 4 MG PO TABS: 4.0000 mg | ORAL_TABLET | Freq: Four times a day (QID) | ORAL | Status: DC | PRN

## 2017-01-27 MED ORDER — METOPROLOL SUCCINATE ER 50 MG PO TB24
50.0000 mg | ORAL_TABLET | Freq: Every day | ORAL | Status: DC
  Filled 2017-01-27: qty 1

## 2017-01-27 MED ORDER — FLUOXETINE HCL 20 MG PO CAPS
40.0000 mg | ORAL_CAPSULE | Freq: Every day | ORAL | Status: DC
  Administered 2017-01-28 – 2017-01-31 (×4): 40 mg via ORAL
  Filled 2017-01-27 (×4): qty 2

## 2017-01-27 MED ORDER — SODIUM CHLORIDE 0.9 % IV SOLN
INTRAVENOUS | Status: DC
  Administered 2017-01-27 – 2017-01-30 (×5): via INTRAVENOUS

## 2017-01-27 MED ORDER — PRAVASTATIN SODIUM 20 MG PO TABS
20.0000 mg | ORAL_TABLET | Freq: Every day | ORAL | Status: DC
  Administered 2017-01-28 – 2017-01-30 (×3): 20 mg via ORAL
  Filled 2017-01-27 (×3): qty 1

## 2017-01-27 MED ORDER — ASPIRIN EC 81 MG PO TBEC
81.0000 mg | DELAYED_RELEASE_TABLET | Freq: Every day | ORAL | Status: DC
  Administered 2017-01-30 – 2017-01-31 (×2): 81 mg via ORAL
  Filled 2017-01-27 (×4): qty 1

## 2017-01-27 MED ORDER — VITAMIN D 1000 UNITS PO TABS
1000.0000 [IU] | ORAL_TABLET | Freq: Every day | ORAL | Status: DC
  Administered 2017-01-28 – 2017-01-31 (×4): 1000 [IU] via ORAL
  Filled 2017-01-27 (×4): qty 1

## 2017-01-27 MED ORDER — LISINOPRIL 20 MG PO TABS
40.0000 mg | ORAL_TABLET | Freq: Every day | ORAL | Status: DC
  Administered 2017-01-28 – 2017-01-31 (×4): 40 mg via ORAL
  Filled 2017-01-27 (×4): qty 2

## 2017-01-27 MED ORDER — ONDANSETRON HCL 4 MG/2ML IJ SOLN
4.0000 mg | Freq: Four times a day (QID) | INTRAMUSCULAR | Status: DC | PRN
  Administered 2017-01-29: 4 mg via INTRAVENOUS

## 2017-01-27 MED ORDER — VANCOMYCIN HCL IN DEXTROSE 1-5 GM/200ML-% IV SOLN
1000.0000 mg | Freq: Once | INTRAVENOUS | Status: AC
  Administered 2017-01-27: 1000 mg via INTRAVENOUS
  Filled 2017-01-27: qty 200

## 2017-01-27 NOTE — ED Triage Notes (Signed)
Pt to triage via wheelchair.  Pt has swelling, fever and pain to right forearm and elbow.  Pt reports drainage from arm.  No known injury.  Pt alert

## 2017-01-27 NOTE — Progress Notes (Signed)
Pharmacy Antibiotic Note  Kristin Avila is a 83 y.o. female admitted on 01/27/2017 with cellulitis.  Pharmacy has been consulted for vanc/cefazolin dosing.  Plan: Patient received vanc 1g IV x 1 in ED Will follow up w/ vanc 1.25g IV q24h  Ke 0.0420 T1/2 16 ~ 24 hrs for ease of admin  Will continue cefazolin 1g IV q8h  Height: 5\' 2"  (157.5 cm) Weight: 150 lb (68 kg) IBW/kg (Calculated) : 50.1  Temp (24hrs), Avg:99.1 F (37.3 C), Min:97.8 F (36.6 C), Max:101 F (38.3 C)   Recent Labs Lab 01/27/17 2008 01/27/17 2026 01/27/17 2113  WBC  --  13.5*  --   CREATININE  --   --  0.80  LATICACIDVEN 0.8  --   --     Estimated Creatinine Clearance: 45.7 mL/min (by C-G formula based on SCr of 0.8 mg/dL).    No Known Allergies   Thank you for allowing pharmacy to be a part of this patient's care.  Tobie Lords 01/27/2017 11:41 PM

## 2017-01-27 NOTE — H&P (Signed)
History and Physical   [facility] - [place] @ [facility] Admission History and Physical [employer], D.O.    Patient Name: Kristin Avila MR#: [id number] Date of Birth: Feb 08, 1928 Date of Admission: 01/27/2017  Referring MD/NP/PA: Dr. Burlene Arnt Primary Care Physician: Einar Pheasant, MD Patient coming from: Home  Chief Complaint:  Chief Complaint  Patient presents with  . Arm Swelling    HPI: Kristin Avila is a 83 y.o. female with a known history of Anemia, atrial fibrillation, hypertension, hyperlipidemia, arthritis presents to the emergency department for evaluation of right arm swelling and pain.  Patient was in a usual state of health until 3 days ago when she describes the onset of right upper extremity redness and swelling which began with some scratches from a bug bite. She says her symptoms are associated with fevers, have been progressively worsening but she denies any pain..  Patient denies weakness, dizziness, chest pain, shortness of breath, N/V/C/D, abdominal pain, dysuria/frequency, changes in mental status.    Otherwise there has been no change in status. Patient has been taking medication as prescribed and there has been no recent change in medication or diet.  No recent antibiotics.  There has been no recent illness, hospitalizations, travel or sick contacts.    EMS/ED Course: Patient received Vanco mycin, Ancef, NS, Tylenol. Radical admission was requested for ongoing management of cellulitis the right upper extremity  Review of Systems:  CONSTITUTIONAL: No fever/chills, fatigue, weakness, weight gain/loss, headache. EYES: No blurry or double vision. ENT: No tinnitus, postnasal drip, redness or soreness of the oropharynx. RESPIRATORY: No cough, dyspnea, wheeze.  No hemoptysis.  CARDIOVASCULAR: No chest pain, palpitations, syncope, orthopnea. No lower extremity edema.  GASTROINTESTINAL: No nausea, vomiting, abdominal pain, diarrhea, constipation.  No  hematemesis, melena or hematochezia. GENITOURINARY: No dysuria, frequency, hematuria. ENDOCRINE: No polyuria or nocturia. No heat or cold intolerance. HEMATOLOGY: No anemia, bruising, bleeding. INTEGUMENTARY: No rashes, ulcers, lesions. MUSCULOSKELETAL: Right arm swelling and pain as per HPI. No arthritis, gout, dyspnea. NEUROLOGIC: No numbness, tingling, ataxia, seizure-type activity, weakness. PSYCHIATRIC: No anxiety, depression, insomnia.   Past Medical History:  Diagnosis Date  . Anemia   . Atrial fibrillation ([place]) 11/13   new onset 11/13  . Hypercholesterolemia   . Hypertension   . Osteoarthritis    hands, feet    Past Surgical History:  Procedure Laterality Date  . APPENDECTOMY  1957  . CATARACT EXTRACTION  2013   left eye  . KIDNEY SURGERY  age 68  . PACEMAKER INSERTION Left 12/14/2016   Procedure: INSERTION PACEMAKER;  Surgeon: Isaias Cowman, MD;  Location: [facility] ORS;  Service: Cardiovascular;  Laterality: Left;  . TUBAL LIGATION       reports that she has never smoked. She has never used smokeless tobacco. She reports that she does not drink alcohol or use drugs.  No Known Allergies  Family History  Problem Relation Age of Onset  . Congestive Heart Failure Father   . Multiple myeloma Mother   . Hypertension Brother   . Alzheimer's disease Unknown        grandmother and great aunts  . Breast cancer Neg Hx   . Colon cancer Neg Hx     Prior to Admission medications   Medication Sig Start Date End Date Taking? Authorizing Provider  acetaminophen (TYLENOL) 325 MG tablet Take 2 tablets (650 mg total) by mouth every 6 (six) hours as needed for mild pain, moderate pain or fever (or Fever >/= 101). 12/11/16  Yes Gouru,  Illene Silver, MD  amLODipine (NORVASC) 10 MG tablet Take 1 tablet (10 mg total) by mouth daily. 01/21/17  Yes Einar Pheasant, MD  Cholecalciferol (VITAMIN D-3) 1000 UNITS CAPS Take 1 capsule by mouth daily.    Yes [provider]  ELIQUIS 5 MG  TABS tablet Take 5 mg by mouth 2 (two) times daily.   Yes [provider]  FLUoxetine (PROZAC) 40 MG capsule Take 1 capsule (40 mg total) by mouth daily. 01/18/17  Yes Einar Pheasant, MD  lisinopril (PRINIVIL,ZESTRIL) 40 MG tablet Take 1 tablet (40 mg total) by mouth daily. 01/18/17  Yes Einar Pheasant, MD  lovastatin (MEVACOR) 20 MG tablet Take 1 tablet (20 mg total) by mouth daily. 01/18/17  Yes Einar Pheasant, MD  metoprolol succinate (TOPROL-XL) 50 MG 24 hr tablet TAKE (1) TABLET BY MOUTH DAILY. TAKE WITH OR IMMEDIATELY FOLLOWING A MEAL 01/19/17  Yes Einar Pheasant, MD  senna-docusate (SENOKOT-S) 8.6-50 MG tablet Take 1 tablet by mouth at bedtime as needed for mild constipation. 12/11/16  Yes Gouru, Illene Silver, MD  traZODone (DESYREL) 100 MG tablet Take 1 tablet (100 mg total) by mouth at bedtime. 01/18/17  Yes Einar Pheasant, MD  aspirin 81 MG tablet Take 81 mg by mouth daily.    [provider]  naproxen sodium (ANAPROX) 220 MG tablet Take 220 mg by mouth 2 (two) times daily with a meal.    [provider]    Physical Exam: Vitals:   01/27/17 2100 01/27/17 2126 01/27/17 2130 01/27/17 2230  BP: (!) 168/77  (!) 158/70 130/60  Pulse: (!) 59  (!) 57 (!) 59  Resp: 20  19 (!) 23  Temp:  98.6 F (37 C)    TempSrc:  Oral    SpO2: 97%  97% 96%  Weight:      Height:        GENERAL: 83 y.o.-year-old female patient, well-developed, well-nourished lying in the bed in no acute distress.  Pleasant and cooperative.   HEENT: Head atraumatic, normocephalic. Pupils equal, round, reactive to light and accommodation. No scleral icterus. Extraocular muscles intact. Nares are patent. Oropharynx is clear. Mucus membranes moist. NECK: Supple, full range of motion. No JVD, no bruit heard. No thyroid enlargement, no tenderness, no cervical lymphadenopathy. CHEST: Normal breath sounds bilaterally. No wheezing, rales, rhonchi or crackles. No use of accessory muscles of respiration.  No  reproducible chest wall tenderness.  CARDIOVASCULAR: S1, S2 normal. No murmurs, rubs, or gallops. Cap refill <2 seconds. Pulses intact distally.  ABDOMEN: Soft, nondistended, nontender. No rebound, guarding, rigidity. Normoactive bowel sounds present in all four quadrants. No organomegaly or mass. EXTREMITIES: There is shiny redness between the right elbow and wrist. The area is warm, mildly tender to palpation. There is no evidence of fluctuation, abscess or mass. There are multiple superficial scratch marks.. No pedal edema, cyanosis, or clubbing. No calf tenderness or Homan's sign.  NEUROLOGIC: The patient is alert and oriented x 3. Cranial nerves II through XII are grossly intact with no focal sensorimotor deficit. Muscle strength 5/5 in all extremities. Sensation intact. Gait not checked. PSYCHIATRIC:  Normal affect, mood, thought content. SKIN: Warm, dry, and intact without obvious rash, lesion, or ulcer.    Labs on Admission:  CBC:  Recent Labs Lab 01/27/17 2026  WBC 13.5*  NEUTROABS 10.7*  HGB 12.7  HCT 37.6  MCV 83.6  PLT 175   Basic Metabolic Panel:  Recent Labs Lab 01/27/17 2113  NA 135  K 4.0  CL 104  CO2 25  GLUCOSE 107*  BUN 22*  CREATININE 0.80  CALCIUM 8.7*   GFR: Estimated Creatinine Clearance: 45.7 mL/min (by C-G formula based on SCr of 0.8 mg/dL). Liver Function Tests:  Recent Labs Lab 01/27/17 2113  AST 24  ALT 15  ALKPHOS 85  BILITOT 1.0  PROT 6.5  ALBUMIN 3.6   No results for input(s): LIPASE, AMYLASE in the last 168 hours. No results for input(s): AMMONIA in the last 168 hours. Coagulation Profile:  Recent Labs Lab 01/27/17 2026  INR 1.12   Cardiac Enzymes: No results for input(s): CKTOTAL, CKMB, CKMBINDEX, TROPONINI in the last 168 hours. BNP (last 3 results) No results for input(s): PROBNP in the last 8760 hours. HbA1C: No results for input(s): HGBA1C in the last 72 hours. CBG: No results for input(s): GLUCAP in the last 168  hours. Lipid Profile: No results for input(s): CHOL, HDL, LDLCALC, TRIG, CHOLHDL, LDLDIRECT in the last 72 hours. Thyroid Function Tests: No results for input(s): TSH, T4TOTAL, FREET4, T3FREE, THYROIDAB in the last 72 hours. Anemia Panel: No results for input(s): VITAMINB12, FOLATE, FERRITIN, TIBC, IRON, RETICCTPCT in the last 72 hours. Urine analysis:    Component Value Date/Time   COLORURINE YELLOW 01/27/2017 2158   APPEARANCEUR CLEAR 01/27/2017 2158   APPEARANCEUR Clear 08/30/2012 0019   LABSPEC 1.015 01/27/2017 2158   LABSPEC 1.004 08/30/2012 0019   PHURINE 6.5 01/27/2017 2158   GLUCOSEU NEGATIVE 01/27/2017 2158   GLUCOSEU Negative 08/30/2012 0019   HGBUR NEGATIVE 01/27/2017 2158   BILIRUBINUR NEGATIVE 01/27/2017 2158   BILIRUBINUR Negative 08/30/2012 0019   KETONESUR 15 (A) 01/27/2017 2158   PROTEINUR NEGATIVE 01/27/2017 2158   NITRITE NEGATIVE 01/27/2017 2158   LEUKOCYTESUR TRACE (A) 01/27/2017 2158   LEUKOCYTESUR Negative 08/30/2012 0019   Sepsis Labs: _0 (procalcitonin:4,lacticidven:4) )No results found for this or any previous visit (from the past 240 hour(s)).   Radiological Exams on Admission: No results found.  Assessment/Plan This is a 83 y.o. female with a history of Anemia, atrial fibrillation, hypertension, hyperlipidemia, arthritis now being admitted with:  #. Cellulitis of the right arm - Admit to inpatient - IV Ancef, Vanco - Check xray - Follow up blood cultures  #. History of atrial fibrillation -Continue Eliquis, metoprolol  #. History of hypertension -Continue Norvasc, lisinopril, metoprolol  #. History of depression - Continue Prozac  #. History of arthritis - Continue naproxen, tramadol  Admission status: Inpatient IV Fluids: Normal saline Diet/Nutrition: Heart healthy Consults called: None  DVT Px: Eliquis SCDs and early ambulation. Code Status: Full Code  Disposition Plan: To home in 1-2 days  All the records are  reviewed and case discussed with ED provider. Management plans discussed with the patient and/or family who express understanding and agree with plan of care.  Kristin Avila D.O. on 01/27/2017 at 10:42 PM Between 7am to 6pm - Pager - [phone] After 6pm go to [email or web address] - Proofreader [facility] [place] [facility] Office [phone] CC: Primary care physician; Einar Pheasant, MD   01/27/2017, 10:42 PM

## 2017-01-28 ENCOUNTER — Inpatient Hospital Stay: Payer: Medicare Other

## 2017-01-28 LAB — LACTIC ACID, PLASMA: Lactic Acid, Venous: 0.8 mmol/L (ref 0.5–1.9)

## 2017-01-28 LAB — BASIC METABOLIC PANEL
Anion gap: 4 — ABNORMAL LOW (ref 5–15)
BUN: 17 mg/dL (ref 6–20)
CALCIUM: 8.6 mg/dL — AB (ref 8.9–10.3)
CO2: 25 mmol/L (ref 22–32)
CREATININE: 0.69 mg/dL (ref 0.44–1.00)
Chloride: 110 mmol/L (ref 101–111)
GFR calc non Af Amer: >60 mL/min (ref >60)
Glucose, Bld: 105 mg/dL — ABNORMAL HIGH (ref 65–99)
Potassium: 3.7 mmol/L (ref 3.5–5.1)
Sodium: 139 mmol/L (ref 135–145)

## 2017-01-28 LAB — CBC
HCT: 36.8 % (ref 35.0–47.0)
Hemoglobin: 12.2 g/dL (ref 12.0–16.0)
MCH: 28.1 pg (ref 26.0–34.0)
MCHC: 33.2 g/dL (ref 32.0–36.0)
MCV: 84.7 fL (ref 80.0–100.0)
Platelets: 207 10*3/uL (ref 150–440)
RBC: 4.35 MIL/uL (ref 3.80–5.20)
RDW: 15.3 % — ABNORMAL HIGH (ref 11.5–14.5)
WBC: 12.7 10*3/uL — ABNORMAL HIGH (ref 3.6–11.0)

## 2017-01-28 MED ORDER — NAPROXEN 250 MG PO TABS
250.0000 mg | ORAL_TABLET | Freq: Two times a day (BID) | ORAL | Status: DC
  Administered 2017-01-28: 250 mg via ORAL
  Filled 2017-01-28: qty 1

## 2017-01-28 MED ORDER — IBUPROFEN 400 MG PO TABS
400.0000 mg | ORAL_TABLET | Freq: Four times a day (QID) | ORAL | Status: DC
  Administered 2017-01-28 – 2017-01-30 (×8): 400 mg via ORAL
  Filled 2017-01-28 (×8): qty 1

## 2017-01-28 NOTE — Progress Notes (Signed)
[facility] at [place] NAME: Trenton Verne    MR#:  [id number]  DATE OF BIRTH:  02-22-1928  SUBJECTIVE:  CHIEF COMPLAINT:   Chief Complaint  Patient presents with  . Arm Swelling  Rt elbow/forearm swelling and pain, daughter at bedside REVIEW OF SYSTEMS:  Review of Systems  Constitutional: Negative for chills, fever and weight loss.  HENT: Positive for hearing loss. Negative for nosebleeds and sore throat.   Eyes: Negative for blurred vision.  Respiratory: Negative for cough, shortness of breath and wheezing.   Cardiovascular: Negative for chest pain, orthopnea, leg swelling and PND.  Gastrointestinal: Negative for abdominal pain, constipation, diarrhea, heartburn, nausea and vomiting.  Genitourinary: Negative for dysuria and urgency.  Musculoskeletal: Positive for joint pain. Negative for back pain.  Skin: Negative for rash.  Neurological: Negative for dizziness, speech change, focal weakness and headaches.  Endo/Heme/Allergies: Does not bruise/bleed easily.  Psychiatric/Behavioral: Negative for depression.    DRUG ALLERGIES:  No Known Allergies VITALS:  Blood pressure (!) 126/50, pulse (!) 58, temperature 99.4 F (37.4 C), temperature source Oral, resp. rate 20, height 5\' 2"  (1.575 m), weight 74.8 kg (164 lb 14.4 oz), SpO2 94 %. PHYSICAL EXAMINATION:  Physical Exam  Constitutional: She is oriented to person, place, and time and well-developed, well-nourished, and in no distress.  HENT:  Head: Normocephalic and atraumatic.  Eyes: Conjunctivae and EOM are normal. Pupils are equal, round, and reactive to light.  Neck: Normal range of motion. Neck supple. No tracheal deviation present. No thyromegaly present.  Cardiovascular: Normal rate, regular rhythm and normal heart sounds.   Pulmonary/Chest: Effort normal and breath sounds normal. No respiratory distress. She has no wheezes. She exhibits no tenderness.  Abdominal: Soft. Bowel  sounds are normal. She exhibits no distension. There is no tenderness.  Musculoskeletal: Normal range of motion.  Neurological: She is alert and oriented to person, place, and time. No cranial nerve deficit.  Skin: Skin is warm and dry. Rash noted. There is erythema.  redness between the right elbow and wrist. The area is warm, mildly tender to palpation  Psychiatric: Mood and affect normal.   LABORATORY PANEL:  Female CBC  Recent Labs Lab 01/28/17 0439  WBC 12.7*  HGB 12.2  HCT 36.8  PLT 207   ------------------------------------------------------------------------------------------------------------------ Chemistries   Recent Labs Lab 01/27/17 2113 01/28/17 0439  NA 135 139  K 4.0 3.7  CL 104 110  CO2 25 25  GLUCOSE 107* 105*  BUN 22* 17  CREATININE 0.80 0.69  CALCIUM 8.7* 8.6*  AST 24  --   ALT 15  --   ALKPHOS 85  --   BILITOT 1.0  --    RADIOLOGY:  Dg Elbow 2 Views Right  Result Date: 01/28/2017 CLINICAL DATA:  Cellulitis of right arm. Right elbow pain and swelling. EXAM: RIGHT ELBOW - 2 VIEW COMPARISON:  None. FINDINGS: There is no evidence of fracture, dislocation, or joint effusion. Mild osteoarthritis. There is diffuse soft tissue edema. Questionable soft tissue defect of the posterior elbow. No tracking soft tissue air. No radiopaque foreign body. IMPRESSION: Diffuse soft tissue edema of the elbow with probable skin defect posteriorly, skin breaks reported on available clinical note. No evidence of tracking soft tissue air. Electronically Signed   By: Jeb Levering M.D.   On: 01/28/2017 00:51   ASSESSMENT AND PLAN:  This is a 83 y.o. female with a history of Anemia, atrial fibrillation, hypertension, hyperlipidemia, arthritis now being  admitted with:  #. Cellulitis of the right arm - continue IV Ancef, Vanco - xray shows diffuse sharp tissue edema.  -Negative blood cultures until now  #. History of atrial fibrillation - Continue Eliquis,  - Hold  Metoprolol and Norvasc as her heart rate is in 50-60s  #Hypotension with history of hypertension -Continue lisinopril.  Hold metoprolol and Norvasc  #. History of depression - Continue Prozac  #. History of arthritis - try ibuprofen QID   All the records are reviewed and case discussed with Care Management/Social Worker. Management plans discussed with the patient, family, Dr Ubaldo Glassing and they are in agreement.  CODE STATUS: Full Code  TOTAL TIME TAKING CARE OF THIS PATIENT: 25 minutes.   More than 50% of the time was spent in counseling/coordination of care: YES  POSSIBLE D/C IN 1 DAYS, DEPENDING ON CLINICAL CONDITION.   Max Sane M.D on 01/28/2017 at 11:25 AM  Between 7am to 6pm - Pager - [phone]  After 6pm go to [email or web address] - Proofreader  [facility] [place] [facility]  Office  [phone]  CC: Primary care physician; Einar Pheasant, MD  Note: This dictation was prepared with Dragon dictation along with smaller phrase technology. Any transcriptional errors that result from this process are unintentional.

## 2017-01-28 NOTE — Care Management (Signed)
Open with [facility] for nursing only

## 2017-01-28 NOTE — ED Provider Notes (Addendum)
[facility] Emergency Department Provider Note  ____________________________________________   I have reviewed the triage vital signs and the nursing notes.   HISTORY  Chief Complaint Arm Swelling    HPI Kristin Avila is a 83 y.o. female who presents today complaining of pain and splint to the right upper extremity for 3 days. She has also had subjective fevers at home. She states she thinks she had some bug bites there earlier she has scratched them. Now it is red and inflamed. She is having minimal discomfort. Nothing makes it better and nothing makes it worse no radiation of associated symptoms no prior treatment.      Past Medical History:  Diagnosis Date  . Anemia   . Atrial fibrillation ([facility]) 11/13   new onset 11/13  . Hypercholesterolemia   . Hypertension   . Osteoarthritis    hands, feet    Patient Active Problem List   Diagnosis Date Noted  . Cellulitis of right arm 01/27/2017  . Sick sinus syndrome ([facility]) 01/04/2017  . Syncope 12/08/2016  . Decreased hearing 05/21/2015  . Constipation 01/13/2015  . Health care maintenance 10/14/2014  . Anxiety 10/02/2012  . Atrial fibrillation ([facility]) 07/09/2012  . Osteoarthritis 07/08/2012  . Hypercholesterolemia 07/08/2012  . Hypertension 07/08/2012    Past Surgical History:  Procedure Laterality Date  . APPENDECTOMY  1957  . CATARACT EXTRACTION  2013   left eye  . KIDNEY SURGERY  age 25  . PACEMAKER INSERTION Left 12/14/2016   Procedure: INSERTION PACEMAKER;  Surgeon: Paraschos, Alexander, MD;  Location: [facility] ORS;  Service: Cardiovascular;  Laterality: Left;  . TUBAL LIGATION      Prior to Admission medications   Medication Sig Start Date End Date Taking? Authorizing Provider  acetaminophen (TYLENOL) 325 MG tablet Take 2 tablets (650 mg total) by mouth every 6 (six) hours as needed for mild pain, moderate pain or fever (or Fever >/= 101). 12/11/16  Yes Gouru, Aruna, MD  amLODipine (NORVASC)  10 MG tablet Take 1 tablet (10 mg total) by mouth daily. 01/21/17  Yes Scott, Charlene, MD  Cholecalciferol (VITAMIN D-3) 1000 UNITS CAPS Take 1 capsule by mouth daily.    Yes [provider]  ELIQUIS 5 MG TABS tablet Take 5 mg by mouth 2 (two) times daily.   Yes [provider]  FLUoxetine (PROZAC) 40 MG capsule Take 1 capsule (40 mg total) by mouth daily. 01/18/17  Yes Scott, Charlene, MD  lisinopril (PRINIVIL,ZESTRIL) 40 MG tablet Take 1 tablet (40 mg total) by mouth daily. 01/18/17  Yes Scott, Charlene, MD  lovastatin (MEVACOR) 20 MG tablet Take 1 tablet (20 mg total) by mouth daily. 01/18/17  Yes Scott, Charlene, MD  metoprolol succinate (TOPROL-XL) 50 MG 24 hr tablet TAKE (1) TABLET BY MOUTH DAILY. TAKE WITH OR IMMEDIATELY FOLLOWING A MEAL 01/19/17  Yes Scott, Charlene, MD  senna-docusate (SENOKOT-S) 8.6-50 MG tablet Take 1 tablet by mouth at bedtime as needed for mild constipation. 12/11/16  Yes Gouru, Aruna, MD  traZODone (DESYREL) 100 MG tablet Take 1 tablet (100 mg total) by mouth at bedtime. 01/18/17  Yes Scott, Charlene, MD  aspirin 81 MG tablet Take 81 mg by mouth daily.    [provider]  naproxen sodium (ANAPROX) 220 MG tablet Take 220 mg by mouth 2 (two) times daily with a meal.    [provider]    Allergies Patient has no known allergies.  Family History  Problem Relation Age of Onset  .   Congestive Heart Failure Father   . Multiple myeloma Mother   . Hypertension Brother   . Alzheimer's disease Unknown        grandmother and great aunts  . Breast cancer Neg Hx   . Colon cancer Neg Hx     Social History Social History  Substance Use Topics  . Smoking status: Never Smoker  . Smokeless tobacco: Never Used  . Alcohol use No     Comment: occasional    Review of Systems Constitutional: No fever/chills Eyes: No visual changes. ENT: No sore throat. No stiff neck no neck pain Cardiovascular: Denies chest pain. Respiratory: Denies  shortness of breath. Gastrointestinal:   no vomiting.  No diarrhea.  No constipation. Genitourinary: Negative for dysuria. Musculoskeletal: Negative lower extremity swelling Skin: Negative for rash. Neurological: Negative for severe headaches, focal weakness or numbness.   ____________________________________________   PHYSICAL EXAM:  VITAL SIGNS: ED Triage Vitals  Enc Vitals Group     BP 01/27/17 2004 107/84     Pulse Rate 01/27/17 2004 (!) 58     Resp 01/27/17 2004 20     Temp 01/27/17 2004 (!) 101 F (38.3 C)     Temp Source 01/27/17 2004 Oral     SpO2 01/27/17 2004 96 %     Weight 01/27/17 2005 150 lb (68 kg)     Height 01/27/17 2005 5' 2" (1.575 m)     Head Circumference --      Peak Flow --      Pain Score 01/27/17 2002 10     Pain Loc --      Pain Edu? --      Excl. in [place]? --     Constitutional: Alert and oriented. Well appearing and in no acute distress. Eyes: Conjunctivae are normal Head: Atraumatic HEENT: No congestion/rhinnorhea. Mucous membranes are moist.  Oropharynx non-erythematous Neck:   Nontender with no meningismus, no masses, no stridor Cardiovascular: Normal rate, regular rhythm. Grossly normal heart sounds.  Good peripheral circulation. Respiratory: Normal respiratory effort.  No retractions. Lungs CTAB. Abdominal: Soft and nontender. No distention. No guarding no rebound Back:  There is no focal tenderness or step off.  there is no midline tenderness there are no lesions noted. there is no CVA tenderness Musculoskeletal: No lower extremity tenderness, Right upper extremity just in the forearm is erythematous and warm there is no crepitus, there is no induration, there are excoriation marks which appear red and inflamed and there is erythema. There is no lymphogenic spread up the arm. She has strong distal pulses and compartments are soft. No joint effusions, no DVT signs strong distal pulses no edema Neurologic:  Normal speech and language. No gross  focal neurologic deficits are appreciated.  Skin:  Skin is warm, dry and intact. No rash noted. Psychiatric: Mood and affect are normal. Speech and behavior are normal.  ____________________________________________   LABS (all labs ordered are listed, but only abnormal results are displayed)  Labs Reviewed  CBC WITH DIFFERENTIAL/PLATELET - Abnormal; Notable for the following:       Result Value   WBC 13.5 (*)    RDW 15.7 (*)    Neutro Abs 10.7 (*)    Monocytes Absolute 1.4 (*)    All other components within normal limits  URINALYSIS, COMPLETE (UACMP) WITH MICROSCOPIC - Abnormal; Notable for the following:    Ketones, ur 15 (*)    Leukocytes, UA TRACE (*)    Squamous Epithelial / LPF 0-5 (*)  Bacteria, UA RARE (*)    All other components within normal limits  COMPREHENSIVE METABOLIC PANEL - Abnormal; Notable for the following:    Glucose, Bld 107 (*)    BUN 22 (*)    Calcium 8.7 (*)    All other components within normal limits  CULTURE, BLOOD (ROUTINE X 2)  CULTURE, BLOOD (ROUTINE X 2)  LACTIC ACID, PLASMA  PROTIME-INR  LACTIC ACID, PLASMA  BASIC METABOLIC PANEL  CBC   ____________________________________________  EKG  I personally interpreted any EKGs ordered by me or triage Likely truly paced rate 86, no acute ST elevation or depression, PVCs noted. No acute ischemic changes ____________________________________________  RADIOLOGY  I reviewed any imaging ordered by me or triage that were performed during my shift and, if possible, patient and/or family made aware of any abnormal findings. ____________________________________________   PROCEDURES  Procedure(s) performed: None  Procedures  Critical Care performed: None  ____________________________________________   INITIAL IMPRESSION / ASSESSMENT AND PLAN / ED COURSE  Pertinent labs & imaging results that were available during my care of the patient were reviewed by me and considered in my medical  decision making (see chart for details).  Patient with evidence of a cellulitis from clear skin breaks that she is made worse by excoriation. No evidence of DVT, this is the forearm only when it is hot to touch, no evidence of abscess. We have started an antibiotic patient is admitted to the hospitalist service for further care. Very well-appearing otherwise.    ____________________________________________   FINAL CLINICAL IMPRESSION(S) / ED DIAGNOSES  Final diagnoses:  Cellulitis of right upper extremity      This chart was dictated using voice recognition software.  Despite best efforts to proofread,  errors can occur which can change meaning.      McShane, James A, MD 01/28/17 0020    McShane, James A, MD 01/28/17 0022  

## 2017-01-28 NOTE — Progress Notes (Signed)
Notified Dr. Manuella Ghazi of low BP and pulse of 60. Per MD hold metoprolol and amlodepine at this time.

## 2017-01-28 NOTE — Care Management (Signed)
Patient admitted from home with cellulitis.  PCP Scott.  Reported that patient lives at home with grandson.  PT has assessed patient and recommends outpatient PT.  Previously patient was discharged on 12/15/16 with home health services through [facility].  Awaiting return call from [place] with [facility] to determine if patient is still open with services.  RNCM following.

## 2017-01-28 NOTE — Evaluation (Signed)
Physical Therapy Evaluation Patient Details Name: Kristin Avila MRN: [id number] DOB: Dec 09, 1927 Today's Date: 01/28/2017   History of Present Illness  Pt is a 83 y/o F who presented for evaluation of R arm swelling and pain with RUE redness and swelling which began with some scratches from a bug bite.  Pt admitted for treatment of RUE cellulitis.  Pt's PMH includes a-fib, pacemaker insertion.    Clinical Impression  Pt admitted with above diagnosis. Pt currently with functional limitations due to the deficits listed below (see PT Problem List). Kristin Avila demonstrates mild instability with sit<>stand transfers and when ambulating without AD but no LOB.  She was encouraged to ambulate in hallway at least 3x/day with nursing staff.  PT will have 24/7 assist/supervision available at d/c.  Pt will benefit from skilled PT to increase their independence and safety with mobility to allow discharge to the venue listed below.      Follow Up Recommendations Outpatient PT    Equipment Recommendations  None recommended by PT    Recommendations for Other Services       Precautions / Restrictions Precautions Precautions: Fall Restrictions Weight Bearing Restrictions: No      Mobility  Bed Mobility               General bed mobility comments: Pt sitting in chair at start and end of session  Transfers Overall transfer level: Needs assistance Equipment used: None Transfers: Sit to/from Stand Sit to Stand: Supervision         General transfer comment: Supervision for safety with mild instability noted with standing.  Ambulation/Gait Ambulation/Gait assistance: Min guard Ambulation Distance (Feet): 200 Feet Assistive device: None Gait Pattern/deviations: Step-through pattern;Narrow base of support Gait velocity: mildly decreased Gait velocity interpretation: Below normal speed for age/gender General Gait Details: Pt reports she feels "wobbly" as this is her first time  ambulating in the hallway.  Min guard provided for safety as pt ambulates with narrow BOS.  She reaches out for countertop and bed rail when ambulating in room.    Stairs            Wheelchair Mobility    Modified Rankin (Stroke Patients Only)       Balance Overall balance assessment: Needs assistance;History of Falls Sitting-balance support: No upper extremity supported;Feet supported Sitting balance-Leahy Scale: Good     Standing balance support: No upper extremity supported;During functional activity Standing balance-Leahy Scale: Fair Standing balance comment: Pt able to perform static and dynamic activities without UE support but likely would lose her balance with outside perturbations.             High level balance activites: Backward walking;Direction changes;Turns;Sudden stops;Head turns;Other (comment) (increased gait speed) High Level Balance Comments: Mild unsteadiness with all high level balance activities but no LOB             Pertinent Vitals/Pain Pain Assessment: No/denies pain    Home Living Family/patient expects to be discharged to:: Private residence Living Arrangements: Other relatives (Grandson and his family) Available Help at Discharge: Family;Available 24 hours/day (Grandson's fiance) Type of Home: House Home Access: Stairs to enter Entrance Stairs-Rails: None Entrance Stairs-Number of Steps: 1 Home Layout: One level Home Equipment: None      Prior Function Level of Independence: Independent         Comments: Pt ambulates without AD.  She reports one fall last week at [facility] when she tripped over something.  Pt independent with bathing, dressing, does some  cooking.  She completed working with HHPT ~2 wks ago.       Hand Dominance        Extremity/Trunk Assessment   Upper Extremity Assessment Upper Extremity Assessment: RUE deficits/detail RUE Deficits / Details: Redness and swelling R elbow region.  Pt denies pain.       Lower Extremity Assessment Lower Extremity Assessment: Overall WFL for tasks assessed       Communication   Communication: HOH  Cognition Arousal/Alertness: Awake/alert Behavior During Therapy: WFL for tasks assessed/performed Overall Cognitive Status: History of cognitive impairments - at baseline                                 General Comments: Very mild memory deficits.       General Comments General comments (skin integrity, edema, etc.): Kristin Avila in room during Evaluation    Exercises Other Exercises Other Exercises: Encouraged pt to ambulate in hallway at least 3x/day with nursing staff   Assessment/Plan    PT Assessment Patient needs continued PT services  PT Problem List Decreased balance;Decreased safety awareness       PT Treatment Interventions DME instruction;Gait training;Stair training;Functional mobility training;Therapeutic activities;Therapeutic exercise;Balance training;Patient/family education;Cognitive remediation    PT Goals (Current goals can be found in the Care Plan section)  Acute Rehab PT Goals Patient Stated Goal: to go home and get stronger PT Goal Formulation: With patient Time For Goal Achievement: 02/11/17 Potential to Achieve Goals: Good    Frequency Min 2X/week   Barriers to discharge        Co-evaluation               AM-PAC PT "6 Clicks" Daily Activity  Outcome Measure Difficulty turning over in bed (including adjusting bedclothes, sheets and blankets)?: None Difficulty moving from lying on back to sitting on the side of the bed? : None Difficulty sitting down on and standing up from a chair with arms (e.g., wheelchair, bedside commode, etc,.)?: A Little Help needed moving to and from a bed to chair (including a wheelchair)?: A Little Help needed walking in hospital room?: A Little Help needed climbing 3-5 steps with a railing? : A Little 6 Click Score: 20    End of Session Equipment Utilized  During Treatment: Gait belt Activity Tolerance: Patient tolerated treatment well Patient left: in chair;with call bell/phone within reach;with chair alarm set;with family/visitor present Nurse Communication: Mobility status;Other (comment) (pt needs new taping for IV line) PT Visit Diagnosis: Unsteadiness on feet (R26.81);Other abnormalities of gait and mobility (R26.89);History of falling (Z91.81)    Time: 6244-6950 PT Time Calculation (min) (ACUTE ONLY): 20 min   Charges:   PT Evaluation $PT Eval Low Complexity: 1 Procedure     PT G Codes:        Collie Siad PT, DPT 01/28/2017, 12:47 PM

## 2017-01-29 ENCOUNTER — Inpatient Hospital Stay: Payer: Medicare Other | Admitting: Anesthesiology

## 2017-01-29 ENCOUNTER — Encounter: Payer: Self-pay | Admitting: Anesthesiology

## 2017-01-29 ENCOUNTER — Encounter
Admission: EM | Disposition: A | Discharge status: Home / Self Care | Payer: Self-pay | Source: Home / Self Care | Attending: Specialist

## 2017-01-29 DIAGNOSIS — L03113 Cellulitis of right upper limb: Principal | ICD-10-CM

## 2017-01-29 HISTORY — PX: INCISION AND DRAINAGE ABSCESS: SHX5864

## 2017-01-29 LAB — BASIC METABOLIC PANEL
ANION GAP: 7 (ref 5–15)
BUN: 17 mg/dL (ref 6–20)
CALCIUM: 8.3 mg/dL — AB (ref 8.9–10.3)
CHLORIDE: 109 mmol/L (ref 101–111)
CO2: 19 mmol/L — AB (ref 22–32)
CREATININE: 0.75 mg/dL (ref 0.44–1.00)
GFR calc Af Amer: >60 mL/min (ref >60)
GFR calc non Af Amer: >60 mL/min (ref >60)
GLUCOSE: 82 mg/dL (ref 65–99)
Potassium: 3.2 mmol/L — ABNORMAL LOW (ref 3.5–5.1)
Sodium: 135 mmol/L (ref 135–145)

## 2017-01-29 LAB — CBC
HEMATOCRIT: 34.2 % — AB (ref 35.0–47.0)
HEMOGLOBIN: 11.4 g/dL — AB (ref 12.0–16.0)
MCH: 28.6 pg (ref 26.0–34.0)
MCHC: 33.3 g/dL (ref 32.0–36.0)
MCV: 86.1 fL (ref 80.0–100.0)
Platelets: 183 10*3/uL (ref 150–440)
RBC: 3.97 MIL/uL (ref 3.80–5.20)
RDW: 15.2 % — AB (ref 11.5–14.5)
WBC: 14.1 10*3/uL — ABNORMAL HIGH (ref 3.6–11.0)

## 2017-01-29 LAB — SURGICAL PCR SCREEN
MRSA, PCR: NEGATIVE
STAPHYLOCOCCUS AUREUS: NEGATIVE

## 2017-01-29 SURGERY — INCISION AND DRAINAGE, ABSCESS
Anesthesia: General | Laterality: Right

## 2017-01-29 MED ORDER — FENTANYL CITRATE (PF) 100 MCG/2ML IJ SOLN
INTRAMUSCULAR | Status: DC | PRN
  Administered 2017-01-29: 50 ug via INTRAVENOUS

## 2017-01-29 MED ORDER — ONDANSETRON HCL 4 MG/2ML IJ SOLN: 4.0000 mg | Freq: Once | INTRAMUSCULAR | Status: DC | PRN

## 2017-01-29 MED ORDER — METOPROLOL SUCCINATE ER 50 MG PO TB24
50.0000 mg | ORAL_TABLET | Freq: Every day | ORAL | Status: DC
  Administered 2017-01-29 – 2017-01-31 (×3): 50 mg via ORAL
  Filled 2017-01-29 (×3): qty 1

## 2017-01-29 MED ORDER — PROPOFOL 10 MG/ML IV BOLUS
INTRAVENOUS | Status: DC | PRN
Start: 2017-01-29 — End: 2017-01-29
  Administered 2017-01-29: 100 mg via INTRAVENOUS

## 2017-01-29 MED ORDER — HYDROCODONE-ACETAMINOPHEN 5-325 MG PO TABS: 1.0000 | ORAL_TABLET | ORAL | Status: DC | PRN

## 2017-01-29 MED ORDER — LACTATED RINGERS IV SOLN
INTRAVENOUS | Status: DC | PRN
  Administered 2017-01-29: 16:00:00 via INTRAVENOUS

## 2017-01-29 MED ORDER — FENTANYL CITRATE (PF) 100 MCG/2ML IJ SOLN
INTRAMUSCULAR | Status: AC
  Filled 2017-01-29: qty 2

## 2017-01-29 MED ORDER — FENTANYL CITRATE (PF) 100 MCG/2ML IJ SOLN: 25.0000 ug | INTRAMUSCULAR | Status: DC | PRN

## 2017-01-29 MED ORDER — MORPHINE SULFATE (PF) 2 MG/ML IV SOLN
2.0000 mg | INTRAVENOUS | Status: DC | PRN
  Administered 2017-01-29: 2 mg via INTRAVENOUS
  Filled 2017-01-29: qty 1

## 2017-01-29 MED ORDER — DEXAMETHASONE SODIUM PHOSPHATE 10 MG/ML IJ SOLN
INTRAMUSCULAR | Status: DC | PRN
  Administered 2017-01-29: 10 mg via INTRAVENOUS

## 2017-01-29 MED ORDER — POTASSIUM CHLORIDE CRYS ER 20 MEQ PO TBCR
40.0000 meq | EXTENDED_RELEASE_TABLET | Freq: Once | ORAL | Status: AC
Start: 2017-01-29 — End: 2017-01-29
  Administered 2017-01-29: 40 meq via ORAL
  Filled 2017-01-29: qty 2

## 2017-01-29 SURGICAL SUPPLY — 20 items
BLADE SURG 15 STRL LF DISP TIS (BLADE) ×1 IMPLANT
BLADE SURG 15 STRL SS (BLADE) ×2
CHLORAPREP W/TINT 10.5 ML (MISCELLANEOUS) ×3 IMPLANT
DRAIN PENROSE 1/4X12 LTX (DRAIN) ×3 IMPLANT
DRAPE LAPAROTOMY 100X77 ABD (DRAPES) ×3 IMPLANT
DRAPE UTILITY 15X26 TOWEL STRL (DRAPES) ×3 IMPLANT
ELECT CAUTERY BLADE 6.4 (BLADE) ×3 IMPLANT
ELECT REM PT RETURN 9FT ADLT (ELECTROSURGICAL)
ELECTRODE REM PT RTRN 9FT ADLT (ELECTROSURGICAL) IMPLANT
GAUZE SPONGE 4X4 12PLY STRL (GAUZE/BANDAGES/DRESSINGS) IMPLANT
GLOVE BIO SURGEON STRL SZ8 (GLOVE) ×3 IMPLANT
GOWN STRL REUS W/ TWL LRG LVL3 (GOWN DISPOSABLE) ×1 IMPLANT
GOWN STRL REUS W/TWL LRG LVL3 (GOWN DISPOSABLE) ×2
KIT RM TURNOVER STRD PROC AR (KITS) ×3 IMPLANT
NEEDLE HYPO 25X1 1.5 SAFETY (NEEDLE) ×3 IMPLANT
NS IRRIG 500ML POUR BTL (IV SOLUTION) ×3 IMPLANT
PACK BASIN MINOR ARMC (MISCELLANEOUS) ×3 IMPLANT
SPONGE LAP 18X18 5 PK (GAUZE/BANDAGES/DRESSINGS) ×3 IMPLANT
SYR BULB EAR ULCER 3OZ GRN STR (SYRINGE) IMPLANT
SYRINGE 10CC LL (SYRINGE) ×3 IMPLANT

## 2017-01-29 NOTE — Anesthesia Procedure Notes (Signed)
Procedure Name: LMA Insertion Date/Time: 01/29/2017 4:23 PM Performed by: Nelda Marseille Pre-anesthesia Checklist: Patient identified, Patient being monitored, Timeout performed, Emergency Drugs available and Suction available Patient Re-evaluated:Patient Re-evaluated prior to inductionOxygen Delivery Method: Circle system utilized Preoxygenation: Pre-oxygenation with 100% oxygen Intubation Type: IV induction Ventilation: Mask ventilation without difficulty LMA: LMA inserted LMA Size: 3.5 Tube type: Oral Number of attempts: 1 Placement Confirmation: positive ETCO2 and breath sounds checked- equal and bilateral Tube secured with: Tape Dental Injury: Teeth and Oropharynx as per pre-operative assessment

## 2017-01-29 NOTE — Consult Note (Signed)
[facility] Infectious Disease     Reason for Consult: Arm cellulitis.    Referring Physician: Max Sane Date of Admission:  01/27/2017   Active Problems:   Cellulitis of right upper extremity   HPI: Kristin Avila is a 83 y.o. female admitted for fevers and  Patient was in a usual state of health until 3 days ago when she describes the onset of right upper extremity redness and swelling which began with some scratches from a bug bite. She had fallen onto R elbow a week ago but it did not start swelling until recently.  On admit she was afebrile had wbc of 13. BCX done pending, xray with soft tissue swelling. Started on vanco and cefazolin.  Past Medical History:  Diagnosis Date  . Anemia   . Atrial fibrillation ([place]) 11/13   new onset 11/13  . Hypercholesterolemia   . Hypertension   . Osteoarthritis    hands, feet   Past Surgical History:  Procedure Laterality Date  . APPENDECTOMY  1957  . CATARACT EXTRACTION  2013   left eye  . KIDNEY SURGERY  age 62  . PACEMAKER INSERTION Left 12/14/2016   Procedure: INSERTION PACEMAKER;  Surgeon: Isaias Cowman, MD;  Location: [facility] ORS;  Service: Cardiovascular;  Laterality: Left;  . TUBAL LIGATION     Social History  Substance Use Topics  . Smoking status: Never Smoker  . Smokeless tobacco: Never Used  . Alcohol use No     Comment: occasional   Family History  Problem Relation Age of Onset  . Congestive Heart Failure Father   . Multiple myeloma Mother   . Hypertension Brother   . Alzheimer's disease Unknown        grandmother and great aunts  . Breast cancer Neg Hx   . Colon cancer Neg Hx     Allergies: No Known Allergies  Current antibiotics: Antibiotics Given (last 72 hours)    Date/Time Action Medication Dose Rate   01/27/17 2122 New Bag/Given   ceFAZolin (ANCEF) IVPB 1 g/50 mL premix 1 g 100 mL/hr   01/27/17 2122 New Bag/Given   vancomycin (VANCOCIN) IVPB 1000 mg/200 mL premix 1,000 mg 200 mL/hr   01/28/17  0405 New Bag/Given   vancomycin (VANCOCIN) 1,250 mg in sodium chloride 0.9 % 250 mL IVPB 1,250 mg 166.7 mL/hr   01/28/17 0544 New Bag/Given   ceFAZolin (ANCEF) IVPB 1 g/50 mL premix 1 g 100 mL/hr   01/28/17 1502 New Bag/Given   ceFAZolin (ANCEF) IVPB 1 g/50 mL premix 1 g 100 mL/hr   01/28/17 2113 New Bag/Given   ceFAZolin (ANCEF) IVPB 1 g/50 mL premix 1 g 100 mL/hr   01/29/17 0507 New Bag/Given   vancomycin (VANCOCIN) 1,250 mg in sodium chloride 0.9 % 250 mL IVPB 1,250 mg 166.7 mL/hr   01/29/17 0647 New Bag/Given   ceFAZolin (ANCEF) IVPB 1 g/50 mL premix 1 g 100 mL/hr      MEDICATIONS: . apixaban  5 mg Oral BID  . aspirin EC  81 mg Oral Daily  . cholecalciferol  1,000 Units Oral Daily  . FLUoxetine  40 mg Oral Daily  . ibuprofen  400 mg Oral QID  . lisinopril  40 mg Oral Daily  . metoprolol succinate  50 mg Oral Daily  . pravastatin  20 mg Oral q1800  . traZODone  100 mg Oral QHS    Review of Systems - 11 systems reviewed and negative per HPI   OBJECTIVE: Temp:  [97.7  F (36.5 C)-98.4 F (36.9 C)] 98.4 F (36.9 C) (06/22 0637) Pulse Rate:  [59-113] 113 (06/22 1153) Resp:  [17-20] 20 (06/22 0637) BP: (116-144)/(51-74) 137/74 (06/22 1153) SpO2:  [98 %-99 %] 99 % (06/22 3734) Physical Exam  Constitutional:  Pleasant, sitting up in chair, daughter present No distress.  HENT: [place]/AT, PERRLA, no scleral icterus Mouth/Throat: Oropharynx is clear and moist. No oropharyngeal exudate.  Cardiovascular: Normal rate, regular rhythm and normal heart sounds.  Pulmonary/Chest: Effort normal and breath sounds normal. No respiratory distress.  has no wheezes.  Neck = supple, no nuchal rigidity Abdominal: Soft. Bowel sounds are normal.  exhibits no distension. There is no tenderness.  Lymphadenopathy: no cervical adenopathy. No axillary adenopathy Neurological: alert and oriented to person, place, and time.  Ext- RUE with edema from about midway up arm down to mid forearm.  Skin: RUE  with red and quite warm, has small opening over olecranon bursa with thin drainage Has 2 scabs over dorsal forearm Psychiatric: a normal mood and affect.  behavior is normal.    LABS: Results for orders placed or performed during the hospital encounter of 01/27/17 (from the past 48 hour(s))  Lactic acid, plasma     Status: None   Collection Time: 01/27/17  8:08 PM  Result Value Ref Range   Lactic Acid, Venous 0.8 0.5 - 1.9 mmol/L  CBC with Differential     Status: Abnormal   Collection Time: 01/27/17  8:26 PM  Result Value Ref Range   WBC 13.5 (H) 3.6 - 11.0 K/uL   RBC 4.50 3.80 - 5.20 MIL/uL   Hemoglobin 12.7 12.0 - 16.0 g/dL   HCT 37.6 35.0 - 47.0 %   MCV 83.6 80.0 - 100.0 fL   MCH 28.1 26.0 - 34.0 pg   MCHC 33.6 32.0 - 36.0 g/dL   RDW 15.7 (H) 11.5 - 14.5 %   Platelets 238 150 - 440 K/uL   Neutrophils Relative % 78 %   Neutro Abs 10.7 (H) 1.4 - 6.5 K/uL   Lymphocytes Relative 9 %   Lymphs Abs 1.2 1.0 - 3.6 K/uL   Monocytes Relative 11 %   Monocytes Absolute 1.4 (H) 0.2 - 0.9 K/uL   Eosinophils Relative 1 %   Eosinophils Absolute 0.1 0 - 0.7 K/uL   Basophils Relative 1 %   Basophils Absolute 0.1 0 - 0.1 K/uL  Protime-INR     Status: None   Collection Time: 01/27/17  8:26 PM  Result Value Ref Range   Prothrombin Time 14.5 11.4 - 15.2 seconds   INR 1.12   Culture, blood (Routine x 2)     Status: None (Preliminary result)   Collection Time: 01/27/17  8:28 PM  Result Value Ref Range   Specimen Description BLOOD BL[facility]    Special Requests      BOTTLES DRAWN AEROBIC AND ANAEROBIC Blood Culture results may not be optimal due to an inadequate volume of blood received in culture bottles   Culture NO GROWTH 2 DAYS    Report Status PENDING   Culture, blood (Routine x 2)     Status: None (Preliminary result)   Collection Time: 01/27/17  8:38 PM  Result Value Ref Range   Specimen Description BLOOD LAC    Special Requests      BOTTLES DRAWN AEROBIC AND ANAEROBIC Blood Culture  adequate volume   Culture NO GROWTH 2 DAYS    Report Status PENDING   Comprehensive metabolic panel     Status:  Abnormal   Collection Time: 01/27/17  9:13 PM  Result Value Ref Range   Sodium 135 135 - 145 mmol/L   Potassium 4.0 3.5 - 5.1 mmol/L   Chloride 104 101 - 111 mmol/L   CO2 25 22 - 32 mmol/L   Glucose, Bld 107 (H) 65 - 99 mg/dL   BUN 22 (H) 6 - 20 mg/dL   Creatinine, Ser 0.80 0.44 - 1.00 mg/dL   Calcium 8.7 (L) 8.9 - 10.3 mg/dL   Total Protein 6.5 6.5 - 8.1 g/dL   Albumin 3.6 3.5 - 5.0 g/dL   AST 24 15 - 41 U/L   ALT 15 14 - 54 U/L   Alkaline Phosphatase 85 38 - 126 U/L   Total Bilirubin 1.0 0.3 - 1.2 mg/dL   GFR calc non Af Amer >60 >60 mL/min   GFR calc Af Amer >60 >60 mL/min    Comment: (NOTE) The eGFR has been calculated using the CKD EPI equation. This calculation has not been validated in all clinical situations. eGFR's persistently <60 mL/min signify possible Chronic Kidney Disease.    Anion gap 6 5 - 15  Urinalysis, Complete w Microscopic     Status: Abnormal   Collection Time: 01/27/17  9:58 PM  Result Value Ref Range   Color, Urine YELLOW YELLOW   APPearance CLEAR CLEAR   Specific Gravity, Urine 1.015 1.005 - 1.030   pH 6.5 5.0 - 8.0   Glucose, UA NEGATIVE NEGATIVE mg/dL   Hgb urine dipstick NEGATIVE NEGATIVE   Bilirubin Urine NEGATIVE NEGATIVE   Ketones, ur 15 (A) NEGATIVE mg/dL   Protein, ur NEGATIVE NEGATIVE mg/dL   Nitrite NEGATIVE NEGATIVE   Leukocytes, UA TRACE (A) NEGATIVE   Squamous Epithelial / LPF 0-5 (A) NONE SEEN   WBC, UA 0-5 0 - 5 WBC/hpf   RBC / HPF 0-5 0 - 5 RBC/hpf   Bacteria, UA RARE (A) NONE SEEN   Mucous PRESENT   Lactic acid, plasma     Status: None   Collection Time: 01/27/17 11:37 PM  Result Value Ref Range   Lactic Acid, Venous 0.8 0.5 - 1.9 mmol/L  Basic metabolic panel     Status: Abnormal   Collection Time: 01/28/17  4:39 AM  Result Value Ref Range   Sodium 139 135 - 145 mmol/L   Potassium 3.7 3.5 - 5.1 mmol/L    Chloride 110 101 - 111 mmol/L   CO2 25 22 - 32 mmol/L   Glucose, Bld 105 (H) 65 - 99 mg/dL   BUN 17 6 - 20 mg/dL   Creatinine, Ser 0.69 0.44 - 1.00 mg/dL   Calcium 8.6 (L) 8.9 - 10.3 mg/dL   GFR calc non Af Amer >60 >60 mL/min   GFR calc Af Amer >60 >60 mL/min    Comment: (NOTE) The eGFR has been calculated using the CKD EPI equation. This calculation has not been validated in all clinical situations. eGFR's persistently <60 mL/min signify possible Chronic Kidney Disease.    Anion gap 4 (L) 5 - 15  CBC     Status: Abnormal   Collection Time: 01/28/17  4:39 AM  Result Value Ref Range   WBC 12.7 (H) 3.6 - 11.0 K/uL   RBC 4.35 3.80 - 5.20 MIL/uL   Hemoglobin 12.2 12.0 - 16.0 g/dL   HCT 36.8 35.0 - 47.0 %   MCV 84.7 80.0 - 100.0 fL   MCH 28.1 26.0 - 34.0 pg   MCHC 33.2 32.0 - 36.0  g/dL   RDW 15.3 (H) 11.5 - 14.5 %   Platelets 207 150 - 440 K/uL  CBC     Status: Abnormal   Collection Time: 01/29/17  5:19 AM  Result Value Ref Range   WBC 14.1 (H) 3.6 - 11.0 K/uL   RBC 3.97 3.80 - 5.20 MIL/uL   Hemoglobin 11.4 (L) 12.0 - 16.0 g/dL   HCT 34.2 (L) 35.0 - 47.0 %   MCV 86.1 80.0 - 100.0 fL   MCH 28.6 26.0 - 34.0 pg   MCHC 33.3 32.0 - 36.0 g/dL   RDW 15.2 (H) 11.5 - 14.5 %   Platelets 183 150 - 440 K/uL  Basic metabolic panel     Status: Abnormal   Collection Time: 01/29/17  5:19 AM  Result Value Ref Range   Sodium 135 135 - 145 mmol/L   Potassium 3.2 (L) 3.5 - 5.1 mmol/L   Chloride 109 101 - 111 mmol/L   CO2 19 (L) 22 - 32 mmol/L   Glucose, Bld 82 65 - 99 mg/dL   BUN 17 6 - 20 mg/dL   Creatinine, Ser 0.75 0.44 - 1.00 mg/dL   Calcium 8.3 (L) 8.9 - 10.3 mg/dL   GFR calc non Af Amer >60 >60 mL/min   GFR calc Af Amer >60 >60 mL/min    Comment: (NOTE) The eGFR has been calculated using the CKD EPI equation. This calculation has not been validated in all clinical situations. eGFR's persistently <60 mL/min signify possible Chronic Kidney Disease.    Anion gap 7 5 - 15   No  components found for: ESR, C REACTIVE PROTEIN MICRO: Recent Results (from the past 720 hour(s))  Culture, blood (Routine x 2)     Status: None (Preliminary result)   Collection Time: 01/27/17  8:28 PM  Result Value Ref Range Status   Specimen Description BLOOD [facility]  Final   Special Requests   Final    BOTTLES DRAWN AEROBIC AND ANAEROBIC Blood Culture results may not be optimal due to an inadequate volume of blood received in culture bottles   Culture NO GROWTH 2 DAYS  Final   Report Status PENDING  Incomplete  Culture, blood (Routine x 2)     Status: None (Preliminary result)   Collection Time: 01/27/17  8:38 PM  Result Value Ref Range Status   Specimen Description BLOOD LAC  Final   Special Requests   Final    BOTTLES DRAWN AEROBIC AND ANAEROBIC Blood Culture adequate volume   Culture NO GROWTH 2 DAYS  Final   Report Status PENDING  Incomplete    IMAGING: Dg Elbow 2 Views Right  Result Date: 01/28/2017 CLINICAL DATA:  Cellulitis of right arm. Right elbow pain and swelling. EXAM: RIGHT ELBOW - 2 VIEW COMPARISON:  None. FINDINGS: There is no evidence of fracture, dislocation, or joint effusion. Mild osteoarthritis. There is diffuse soft tissue edema. Questionable soft tissue defect of the posterior elbow. No tracking soft tissue air. No radiopaque foreign body. IMPRESSION: Diffuse soft tissue edema of the elbow with probable skin defect posteriorly, skin breaks reported on available clinical note. No evidence of tracking soft tissue air. Electronically Signed   By: Jeb Levering M.D.   On: 01/28/2017 00:51    Assessment:   ESTI DEMELLO is a 83 y.o. female with RUE cellulitis and likely abscess s/p fall a week ago and some skin lesions on forearm.  She has elevated wbc and fevers. For surgery this pm.  Infection likely caused by staph or strep.  Recommendations I have cultured the drainage from the elbow.  Would send more cultures from OR.  Agree with vanco.  Can change ancef  to ceftriaxone until culture results available WIll likely be able to dc on oral abx in a few days if surgery successful and clinically improving.   Thank you very much for allowing me to participate in the care of this patient. Please call with questions.   Cheral Marker. Ola Spurr, MD

## 2017-01-29 NOTE — Anesthesia Post-op Follow-up Note (Cosign Needed)
Anesthesia QCDR form completed.        

## 2017-01-29 NOTE — Op Note (Signed)
01/27/2017 - 01/29/2017  4:33 PM  PATIENT:  Kristin Avila  83 y.o. female  PRE-OPERATIVE DIAGNOSIS:  Right forearm abscess  POST-OPERATIVE DIAGNOSIS:  Same  PROCEDURE: Incise and drain right forearm abscess  SURGEON:  Florene Glen MD, FACS   ANESTHESIA:  Gen. with LMA   Details of Procedure: This a patient with a right forearm abscess and cellulitis. Preoperatively discussed rationale for offering surgery with form of incision and drainage and the options of observation as well as risk bleeding infection recurrent infection the need for further surgery this is all emphasized and repeated in the preop holding area the understood and agreed to proceed.  Findings clear non-foul smelling fluid in the tissue space with some gray appearing tissue with a loose cavity tracking from the lateral ulnar side forearm to the elbow. There was a small 3 mm punctate skin defect at the elbow which was open but did not track nor did it communicate with this larger cavity. No frank purulence was identified other than this clear fluid which was cultured.  Description of procedure patient was induced to general anesthesia prepped draped sterile fashion a surgical pause was held and then the fluctuant area on the lateral aspect or ulnar aspect of the forearm was incised and clear fluid was identified and cultured. Blunt dissection with the finger or the Yankauer sucker was performed to delineate tracks towards the elbow this was all opened and irrigated with copious amounts normal saline. A Penrose drain was placed into this larger cavity and held in with 3-0 nylon. Attention was turned to the elbow area where a small punctate opening was identified. This was opened bluntly and there was no tracking it was very superficial and not opened any further.  A dry dressing was placed with an ABDs Kerlix and Ace wraps. Patient started the procedure well there were no Occasions she was taken the recovery room in stable  condition to be admitted for continued care sponge lap needle count was correct   Florene Glen, MD FACS

## 2017-01-29 NOTE — Progress Notes (Addendum)
Per Dr. Manuella Ghazi make pt NPO until Dr. Burt Knack comes to see her, but okay to give her morning meds.

## 2017-01-29 NOTE — Progress Notes (Signed)
[place] [facility] [place] NAME: Saanvi Hakala    MR#:  [id number]  DATE OF BIRTH:  12/16/1927  SUBJECTIVE:  CHIEF COMPLAINT:   Chief Complaint  Patient presents with  . Arm Swelling  worsening Rt elbow/forearm swelling and pain. Sitting in chair REVIEW OF SYSTEMS:  Review of Systems  Constitutional: Negative for chills, fever and weight loss.  HENT: Positive for hearing loss. Negative for nosebleeds and sore throat.   Eyes: Negative for blurred vision.  Respiratory: Negative for cough, shortness of breath and wheezing.   Cardiovascular: Negative for chest pain, orthopnea, leg swelling and PND.  Gastrointestinal: Negative for abdominal pain, constipation, diarrhea, heartburn, nausea and vomiting.  Genitourinary: Negative for dysuria and urgency.  Musculoskeletal: Positive for joint pain. Negative for back pain.  Skin: Negative for rash.  Neurological: Negative for dizziness, speech change, focal weakness and headaches.  Endo/Heme/Allergies: Does not bruise/bleed easily.  Psychiatric/Behavioral: Negative for depression.   DRUG ALLERGIES:  No Known Allergies VITALS:  Blood pressure 137/74, pulse (!) 113, temperature 98.4 F (36.9 C), temperature source Oral, resp. rate 20, height 5\' 2"  (1.575 m), weight 74.8 kg (164 lb 14.4 oz), SpO2 99 %. PHYSICAL EXAMINATION:  Physical Exam  Constitutional: She is oriented to person, place, and time and well-developed, well-nourished, and in no distress.    HENT:  Head: Normocephalic and atraumatic.  Eyes: Conjunctivae and EOM are normal. Pupils are equal, round, and reactive to light.  Neck: Normal range of motion. Neck supple. No tracheal deviation present. No thyromegaly present.  Cardiovascular: Normal rate, regular rhythm and normal heart sounds.   Pulmonary/Chest: Effort normal and breath sounds normal. No respiratory distress. She has no wheezes. She exhibits no tenderness.  Abdominal: Soft.  Bowel sounds are normal. She exhibits no distension. There is no tenderness.  Musculoskeletal: Normal range of motion.  Neurological: She is alert and oriented to person, place, and time. No cranial nerve deficit.  Skin: Skin is warm and dry. Rash noted. There is erythema.  redness between the right elbow and forearm. The area is warm, mildly tender to palpation. Now also seems to worsening swelling and fluctuance with point tenderness.    Psychiatric: Mood and affect normal.   LABORATORY PANEL:  Female CBC  Recent Labs Lab 01/29/17 0519  WBC 14.1*  HGB 11.4*  HCT 34.2*  PLT 183   ------------------------------------------------------------------------------------------------------------------ Chemistries   Recent Labs Lab 01/27/17 2113  01/29/17 0519  NA 135  < > 135  K 4.0  < > 3.2*  CL 104  < > 109  CO2 25  < > 19*  GLUCOSE 107*  < > 82  BUN 22*  < > 17  CREATININE 0.80  < > 0.75  CALCIUM 8.7*  < > 8.3*  AST 24  --   --   ALT 15  --   --   ALKPHOS 85  --   --   BILITOT 1.0  --   --   < > = values in this interval not displayed. RADIOLOGY:  No results found. ASSESSMENT AND PLAN:  This is a 83 y.o. female with a history of Anemia, atrial fibrillation, hypertension, hyperlipidemia, arthritis now being admitted with:  #. Cellulitis/Abscess of the right arm - continue IV Ancef, Vanco - xray shows diffuse sharp tissue edema.  -Negative blood cultures until now - seem to be worse than y'day - I called and d/w Dr Copper to see if this can be  drained - seem to have formed abscess now. - will c/s ID  #. History of atrial fibrillation - Continue Eliquis  - resume Metoprolol as her heart rate is in 130s. hold Norvasc  #Hypotension with history of hypertension -Continue lisinopril. - resume Metoprolol as her heart rate is in 130s. hold Norvasc  #. History of depression - Continue Prozac  #. History of arthritis - ibuprofen QID   All the records are reviewed  and case discussed with Care Management/Social Worker. Management plans discussed with the patient, family, Dr Burt Knack and they are in agreement.  CODE STATUS: Full Code  TOTAL TIME TAKING CARE OF THIS PATIENT: 25 minutes.   More than 50% of the time was spent in counseling/coordination of care: YES  POSSIBLE D/C IN 1-2 DAYS, DEPENDING ON CLINICAL CONDITION.   Max Sane M.D on 01/29/2017 at 12:20 PM  Between 7am to 6pm - Pager - [phone]  After 6pm go to [email or web address] - Proofreader  [facility]  Office  [phone]  CC: Primary care physician; Einar Pheasant, MD  Note: This dictation was prepared with Dragon dictation along with smaller phrase technology. Any transcriptional errors that result from this process are unintentional.

## 2017-01-29 NOTE — Care Management Important Message (Signed)
Important Message  Patient Details  Name: Kristin Avila MRN: [id number] Date of Birth: July 18, 1928   Medicare Important Message Given:  Yes    Beverly Sessions, RN 01/29/2017, 3:37 PM

## 2017-01-29 NOTE — Progress Notes (Addendum)
Spoke with Dr. Manuella Ghazi will place order to resume Metoprolol at home dose as pulse was elevated this Am and pt will be going to the OR. Verified with Dr. Burt Knack to give this med with a small sip of water.

## 2017-01-29 NOTE — Transfer of Care (Signed)
Immediate Anesthesia Transfer of Care Note  Patient: Kristin Avila  Procedure(s) Performed: Procedure(s): INCISION AND DRAINAGE ABSCESS (Right)  Patient Location: PACU  Anesthesia Type:General  Level of Consciousness: sedated  Airway & Oxygen Therapy: Patient Spontanous Breathing and Patient connected to face mask oxygen  Post-op Assessment: Report given to RN and Post -op Vital signs reviewed and stable  Post vital signs: Reviewed and stable  Last Vitals:  Vitals:   01/29/17 0955 01/29/17 1153  BP: (!) 144/72 137/74  Pulse: 86 (!) 113  Resp:  19  Temp:  36.7 C    Last Pain:  Vitals:   01/29/17 1153  TempSrc: Oral  PainSc:          Complications: No apparent anesthesia complications

## 2017-01-29 NOTE — Consult Note (Signed)
Surgical Consultation  01/29/2017  Kristin Avila is an 83 y.o. female.   CC: Right arm swelling  HPI: This a patient who fell 7 days ago striking her arm. On Wednesday she startednoticing swelling and worsening pain but no fevers or chills. She's never had an episode like this before. Her pain is minimal at this point. I was asked see the patient for probable abscess in a patient being treated for cellulitis.  Past Medical History:  Diagnosis Date  . Anemia   . Atrial fibrillation ([place]) 11/13   new onset 11/13  . Hypercholesterolemia   . Hypertension   . Osteoarthritis    hands, feet    Past Surgical History:  Procedure Laterality Date  . APPENDECTOMY  1957  . CATARACT EXTRACTION  2013   left eye  . KIDNEY SURGERY  age 81  . PACEMAKER INSERTION Left 12/14/2016   Procedure: INSERTION PACEMAKER;  Surgeon: Isaias Cowman, MD;  Location: [facility] ORS;  Service: Cardiovascular;  Laterality: Left;  . TUBAL LIGATION      Family History  Problem Relation Age of Onset  . Congestive Heart Failure Father   . Multiple myeloma Mother   . Hypertension Brother   . Alzheimer's disease Unknown        grandmother and great aunts  . Breast cancer Neg Hx   . Colon cancer Neg Hx     Social History:  reports that she has never smoked. She has never used smokeless tobacco. She reports that she does not drink alcohol or use drugs.  Allergies: No Known Allergies  Medications reviewed.   Review of Systems:   Review of Systems  Constitutional: Negative for chills and fever.  HENT: Negative.   Eyes: Negative.   Respiratory: Negative.   Cardiovascular: Negative.   Gastrointestinal: Negative.   Genitourinary: Negative.   Musculoskeletal: Negative.   Skin: Negative.   Neurological: Negative.   Endo/Heme/Allergies: Negative.   Psychiatric/Behavioral: Negative.      Physical Exam:  BP (!) 144/72   Pulse 86   Temp 98.4 F (36.9 C) (Oral)   Resp 20   Ht 5' 2"  (1.575 m)   Wt  164 lb 14.4 oz (74.8 kg)   SpO2 99%   BMI 30.16 kg/m   Physical Exam  Constitutional: She is oriented to person, place, and time and well-developed, well-nourished, and in no distress. No distress.  HENT:  Head: Normocephalic and atraumatic.  Eyes: Right eye exhibits no discharge. Left eye exhibits no discharge. No scleral icterus.  Pulmonary/Chest: Effort normal. No respiratory distress.  Musculoskeletal: Normal range of motion. She exhibits edema, tenderness and deformity.  Right arm erythema swelling fluctuance and point tenderness on the lateral side or ulnar side of the forearm. There is an obvious abscess present. But no drainage. There is full function both sensory and motor in the hand.  Neurological: She is alert and oriented to person, place, and time.  Skin: Skin is warm. She is not diaphoretic. There is erythema.  Vitals reviewed.     Results for orders placed or performed during the hospital encounter of 01/27/17 (from the past 48 hour(s))  Lactic acid, plasma     Status: None   Collection Time: 01/27/17  8:08 PM  Result Value Ref Range   Lactic Acid, Venous 0.8 0.5 - 1.9 mmol/L  CBC with Differential     Status: Abnormal   Collection Time: 01/27/17  8:26 PM  Result Value Ref Range   WBC 13.5 (H) 3.6 -  11.0 K/uL   RBC 4.50 3.80 - 5.20 MIL/uL   Hemoglobin 12.7 12.0 - 16.0 g/dL   HCT 37.6 35.0 - 47.0 %   MCV 83.6 80.0 - 100.0 fL   MCH 28.1 26.0 - 34.0 pg   MCHC 33.6 32.0 - 36.0 g/dL   RDW 15.7 (H) 11.5 - 14.5 %   Platelets 238 150 - 440 K/uL   Neutrophils Relative % 78 %   Neutro Abs 10.7 (H) 1.4 - 6.5 K/uL   Lymphocytes Relative 9 %   Lymphs Abs 1.2 1.0 - 3.6 K/uL   Monocytes Relative 11 %   Monocytes Absolute 1.4 (H) 0.2 - 0.9 K/uL   Eosinophils Relative 1 %   Eosinophils Absolute 0.1 0 - 0.7 K/uL   Basophils Relative 1 %   Basophils Absolute 0.1 0 - 0.1 K/uL  Protime-INR     Status: None   Collection Time: 01/27/17  8:26 PM  Result Value Ref Range    Prothrombin Time 14.5 11.4 - 15.2 seconds   INR 1.12   Culture, blood (Routine x 2)     Status: None (Preliminary result)   Collection Time: 01/27/17  8:28 PM  Result Value Ref Range   Specimen Description BLOOD BLH    Special Requests      BOTTLES DRAWN AEROBIC AND ANAEROBIC Blood Culture results may not be optimal due to an inadequate volume of blood received in culture bottles   Culture NO GROWTH 2 DAYS    Report Status PENDING   Culture, blood (Routine x 2)     Status: None (Preliminary result)   Collection Time: 01/27/17  8:38 PM  Result Value Ref Range   Specimen Description BLOOD LAC    Special Requests      BOTTLES DRAWN AEROBIC AND ANAEROBIC Blood Culture adequate volume   Culture NO GROWTH 2 DAYS    Report Status PENDING   Comprehensive metabolic panel     Status: Abnormal   Collection Time: 01/27/17  9:13 PM  Result Value Ref Range   Sodium 135 135 - 145 mmol/L   Potassium 4.0 3.5 - 5.1 mmol/L   Chloride 104 101 - 111 mmol/L   CO2 25 22 - 32 mmol/L   Glucose, Bld 107 (H) 65 - 99 mg/dL   BUN 22 (H) 6 - 20 mg/dL   Creatinine, Ser 0.80 0.44 - 1.00 mg/dL   Calcium 8.7 (L) 8.9 - 10.3 mg/dL   Total Protein 6.5 6.5 - 8.1 g/dL   Albumin 3.6 3.5 - 5.0 g/dL   AST 24 15 - 41 U/L   ALT 15 14 - 54 U/L   Alkaline Phosphatase 85 38 - 126 U/L   Total Bilirubin 1.0 0.3 - 1.2 mg/dL   GFR calc non Af Amer >60 >60 mL/min   GFR calc Af Amer >60 >60 mL/min    Comment: (NOTE) The eGFR has been calculated using the CKD EPI equation. This calculation has not been validated in all clinical situations. eGFR's persistently <60 mL/min signify possible Chronic Kidney Disease.    Anion gap 6 5 - 15  Urinalysis, Complete w Microscopic     Status: Abnormal   Collection Time: 01/27/17  9:58 PM  Result Value Ref Range   Color, Urine YELLOW YELLOW   APPearance CLEAR CLEAR   Specific Gravity, Urine 1.015 1.005 - 1.030   pH 6.5 5.0 - 8.0   Glucose, UA NEGATIVE NEGATIVE mg/dL   Hgb urine  dipstick NEGATIVE NEGATIVE  Bilirubin Urine NEGATIVE NEGATIVE   Ketones, ur 15 (A) NEGATIVE mg/dL   Protein, ur NEGATIVE NEGATIVE mg/dL   Nitrite NEGATIVE NEGATIVE   Leukocytes, UA TRACE (A) NEGATIVE   Squamous Epithelial / LPF 0-5 (A) NONE SEEN   WBC, UA 0-5 0 - 5 WBC/hpf   RBC / HPF 0-5 0 - 5 RBC/hpf   Bacteria, UA RARE (A) NONE SEEN   Mucous PRESENT   Lactic acid, plasma     Status: None   Collection Time: 01/27/17 11:37 PM  Result Value Ref Range   Lactic Acid, Venous 0.8 0.5 - 1.9 mmol/L  Basic metabolic panel     Status: Abnormal   Collection Time: 01/28/17  4:39 AM  Result Value Ref Range   Sodium 139 135 - 145 mmol/L   Potassium 3.7 3.5 - 5.1 mmol/L   Chloride 110 101 - 111 mmol/L   CO2 25 22 - 32 mmol/L   Glucose, Bld 105 (H) 65 - 99 mg/dL   BUN 17 6 - 20 mg/dL   Creatinine, Ser 0.69 0.44 - 1.00 mg/dL   Calcium 8.6 (L) 8.9 - 10.3 mg/dL   GFR calc non Af Amer >60 >60 mL/min   GFR calc Af Amer >60 >60 mL/min    Comment: (NOTE) The eGFR has been calculated using the CKD EPI equation. This calculation has not been validated in all clinical situations. eGFR's persistently <60 mL/min signify possible Chronic Kidney Disease.    Anion gap 4 (L) 5 - 15  CBC     Status: Abnormal   Collection Time: 01/28/17  4:39 AM  Result Value Ref Range   WBC 12.7 (H) 3.6 - 11.0 K/uL   RBC 4.35 3.80 - 5.20 MIL/uL   Hemoglobin 12.2 12.0 - 16.0 g/dL   HCT 36.8 35.0 - 47.0 %   MCV 84.7 80.0 - 100.0 fL   MCH 28.1 26.0 - 34.0 pg   MCHC 33.2 32.0 - 36.0 g/dL   RDW 15.3 (H) 11.5 - 14.5 %   Platelets 207 150 - 440 K/uL  CBC     Status: Abnormal   Collection Time: 01/29/17  5:19 AM  Result Value Ref Range   WBC 14.1 (H) 3.6 - 11.0 K/uL   RBC 3.97 3.80 - 5.20 MIL/uL   Hemoglobin 11.4 (L) 12.0 - 16.0 g/dL   HCT 34.2 (L) 35.0 - 47.0 %   MCV 86.1 80.0 - 100.0 fL   MCH 28.6 26.0 - 34.0 pg   MCHC 33.3 32.0 - 36.0 g/dL   RDW 15.2 (H) 11.5 - 14.5 %   Platelets 183 150 - 440 K/uL  Basic  metabolic panel     Status: Abnormal   Collection Time: 01/29/17  5:19 AM  Result Value Ref Range   Sodium 135 135 - 145 mmol/L   Potassium 3.2 (L) 3.5 - 5.1 mmol/L   Chloride 109 101 - 111 mmol/L   CO2 19 (L) 22 - 32 mmol/L   Glucose, Bld 82 65 - 99 mg/dL   BUN 17 6 - 20 mg/dL   Creatinine, Ser 0.75 0.44 - 1.00 mg/dL   Calcium 8.3 (L) 8.9 - 10.3 mg/dL   GFR calc non Af Amer >60 >60 mL/min   GFR calc Af Amer >60 >60 mL/min    Comment: (NOTE) The eGFR has been calculated using the CKD EPI equation. This calculation has not been validated in all clinical situations. eGFR's persistently <60 mL/min signify possible Chronic Kidney Disease.  Anion gap 7 5 - 15   Dg Elbow 2 Views Right  Result Date: 01/28/2017 CLINICAL DATA:  Cellulitis of right arm. Right elbow pain and swelling. EXAM: RIGHT ELBOW - 2 VIEW COMPARISON:  None. FINDINGS: There is no evidence of fracture, dislocation, or joint effusion. Mild osteoarthritis. There is diffuse soft tissue edema. Questionable soft tissue defect of the posterior elbow. No tracking soft tissue air. No radiopaque foreign body. IMPRESSION: Diffuse soft tissue edema of the elbow with probable skin defect posteriorly, skin breaks reported on available clinical note. No evidence of tracking soft tissue air. Electronically Signed   By: Jeb Levering M.D.   On: 01/28/2017 00:51    Assessment/Plan:  Labs and plain film reviewed personally.  Suspect right arm abscess in a patient being treated with IV antibiotics for right arm cellulitis. She fell a week ago and has had worsening pain and swelling over the last 2 days area I recommended incision and drainage in the operating room. The patient has had a full breakfast at 8:00 this morning and therefore will be scheduled later this afternoon. Discussed with she and her daughter the rationale for this approach and the options of continued observation and IV antibiotics as well as risk of bleeding infection  recurrence open wound and drain placement. Of most importance I discussed that because of her nothing by mouth status this could be done later this evening depending on the operating room schedule and that it would either be performed by me or by Dr. Dahlia Byes. They were understanding of this and agreed with the plan.  Florene Glen, MD, FACS

## 2017-01-29 NOTE — Anesthesia Preprocedure Evaluation (Addendum)
Anesthesia Evaluation  Patient identified by MRN, date of birth, ID band Patient awake    Reviewed: Allergy & Precautions, NPO status , Patient's Chart, lab work & pertinent test results, reviewed documented beta blocker date and time   Airway Mallampati: III  TM Distance: >3 FB     Dental  (+) Chipped, Missing   Pulmonary    Pulmonary exam normal        Cardiovascular hypertension, Pt. on medications and Pt. on home beta blockers + dysrhythmias Atrial Fibrillation + pacemaker  Rhythm:Irregular Rate:Tachycardia     Neuro/Psych Anxiety    GI/Hepatic   Endo/Other    Renal/GU      Musculoskeletal  (+) Arthritis ,   Abdominal Normal abdominal exam  (+)   Peds  Hematology  (+) anemia ,   Anesthesia Other Findings   Reproductive/Obstetrics                           Anesthesia Physical Anesthesia Plan  ASA: III  Anesthesia Plan: General   Post-op Pain Management:    Induction: Intravenous  PONV Risk Score and Plan:   Airway Management Planned: Oral ETT and LMA  Additional Equipment:   Intra-op Plan:   Post-operative Plan: Extubation in OR  Informed Consent: I have reviewed the patients History and Physical, chart, labs and discussed the procedure including the risks, benefits and alternatives for the proposed anesthesia with the patient or authorized representative who has indicated his/her understanding and acceptance.     Plan Discussed with: CRNA and Surgeon  Anesthesia Plan Comments:        Anesthesia Quick Evaluation

## 2017-01-30 NOTE — Progress Notes (Signed)
Placed bandaid on right elbow - draining a scant amount.

## 2017-01-30 NOTE — Progress Notes (Signed)
1 Day Post-Op  Subjective: Status post incision and drainage of right forearm abscess. Patient feels well today no arm pain  Objective: Vital signs in last 24 hours: Temp:  [97.4 F (36.3 C)-98.7 F (37.1 C)] 97.4 F (36.3 C) (06/23 0439) Pulse Rate:  [59-113] 101 (06/23 0439) Resp:  [16-22] 16 (06/23 0439) BP: (107-144)/(46-74) 127/65 (06/23 0439) SpO2:  [93 %-100 %] 95 % (06/23 0439) Last BM Date: 01/29/17  Intake/Output from previous day: 06/22 0701 - 06/23 0700 In: 3176.5 [P.O.:240; I.V.:2386.5; IV Piggyback:550] Out: 4536 [Urine:2550; Blood:1] Intake/Output this shift: Total I/O In: 106 [I.V.:106] Out: 350 [Urine:350]  Physical exam:  Patient is afebrile and appears comfortable Dressing is taken down wounds are clean much less erythema resolving edema and no purulence noted  Lab Results: CBC   Recent Labs  01/28/17 0439 01/29/17 0519  WBC 12.7* 14.1*  HGB 12.2 11.4*  HCT 36.8 34.2*  PLT 207 183   BMET  Recent Labs  01/28/17 0439 01/29/17 0519  NA 139 135  K 3.7 3.2*  CL 110 109  CO2 25 19*  GLUCOSE 105* 82  BUN 17 17  CREATININE 0.69 0.75  CALCIUM 8.6* 8.3*   PT/INR  Recent Labs  01/27/17 2026  LABPROT 14.5  INR 1.12   ABG No results for input(s): PHART, HCO3 in the last 72 hours.  Invalid input(s): PCO2, PO2  Studies/Results: No results found.  Anti-infectives: Anti-infectives    Start     Dose/Rate Route Frequency Ordered Stop   01/28/17 0530  ceFAZolin (ANCEF) IVPB 1 g/50 mL premix     1 g 100 mL/hr over 30 Minutes Intravenous Every 8 hours 01/27/17 2341     01/28/17 0430  vancomycin (VANCOCIN) 1,250 mg in sodium chloride 0.9 % 250 mL IVPB     1,250 mg 166.7 mL/hr over 90 Minutes Intravenous Every 24 hours 01/27/17 2341     01/27/17 2115  ceFAZolin (ANCEF) IVPB 1 g/50 mL premix     1 g 100 mL/hr over 30 Minutes Intravenous  Once 01/27/17 2103 01/27/17 2155   01/27/17 2115  vancomycin (VANCOCIN) IVPB 1000 mg/200 mL premix      1,000 mg 200 mL/hr over 60 Minutes Intravenous  Once 01/27/17 2109 01/27/17 2234      Assessment/Plan: s/p Procedure(s): INCISION AND DRAINAGE ABSCESS   Dressing to be replaced cultures are pending continue IV antibiotics today and probably needs one more day of IV antibiotics.  Florene Glen, MD, FACS  01/30/2017

## 2017-01-30 NOTE — Progress Notes (Signed)
[place] [facility] [place] NAME: Kristin Avila    MR#:  [id number]  DATE OF BIRTH:  14-Sep-1927  SUBJECTIVE:   Patient here due to right upper extremity cellulitis with abscess. Status post incision and drainage done by surgery yesterday. Feels better. Afebrile, no other acute events overnight.  REVIEW OF SYSTEMS:    Review of Systems  Constitutional: Negative for chills and fever.  HENT: Negative for congestion and tinnitus.   Eyes: Negative for blurred vision and double vision.  Respiratory: Negative for cough, shortness of breath and wheezing.   Cardiovascular: Negative for chest pain, orthopnea and PND.  Gastrointestinal: Negative for abdominal pain, diarrhea, nausea and vomiting.  Genitourinary: Negative for dysuria and hematuria.  Neurological: Negative for dizziness, sensory change and focal weakness.  All other systems reviewed and are negative.   Nutrition: Regular Tolerating Diet: Yes Tolerating PT: Await Eval.   DRUG ALLERGIES:  No Known Allergies  VITALS:  Blood pressure 127/77, pulse (!) 106, temperature 97.4 F (36.3 C), temperature source Oral, resp. rate 16, height 5\' 2"  (1.575 m), weight 74.8 kg (164 lb 14.4 oz), SpO2 95 %.  PHYSICAL EXAMINATION:   Physical Exam  GENERAL:  83 y.o.-year-old patient sitting up in chair in no acute distress.  EYES: Pupils equal, round, reactive to light and accommodation. No scleral icterus. Extraocular muscles intact.  HEENT: Head atraumatic, normocephalic. Oropharynx and nasopharynx clear.  NECK:  Supple, no jugular venous distention. No thyroid enlargement, no tenderness.  LUNGS: Normal breath sounds bilaterally, no wheezing, rales, rhonchi. No use of accessory muscles of respiration.  CARDIOVASCULAR: S1, S2 normal. No murmurs, rubs, or gallops.  ABDOMEN: Soft, nontender, nondistended. Bowel sounds present. No organomegaly or mass.  EXTREMITIES: No cyanosis, clubbing or edema b/l.  Right upper ext. Dressing in place after I & D.  NEUROLOGIC: Cranial nerves II through XII are intact. No focal Motor or sensory deficits b/l.   PSYCHIATRIC: The patient is alert and oriented x 3.  SKIN: No obvious rash, lesion, or ulcer.    LABORATORY PANEL:   CBC  Recent Labs Lab 01/29/17 0519  WBC 14.1*  HGB 11.4*  HCT 34.2*  PLT 183   ------------------------------------------------------------------------------------------------------------------  Chemistries   Recent Labs Lab 01/27/17 2113  01/29/17 0519  NA 135  < > 135  K 4.0  < > 3.2*  CL 104  < > 109  CO2 25  < > 19*  GLUCOSE 107*  < > 82  BUN 22*  < > 17  CREATININE 0.80  < > 0.75  CALCIUM 8.7*  < > 8.3*  AST 24  --   --   ALT 15  --   --   ALKPHOS 85  --   --   BILITOT 1.0  --   --   < > = values in this interval not displayed. ------------------------------------------------------------------------------------------------------------------  Cardiac Enzymes No results for input(s): TROPONINI in the last 168 hours. ------------------------------------------------------------------------------------------------------------------  RADIOLOGY:  No results found.   ASSESSMENT AND PLAN:   83 yo female HTN, Hyperlipidemia, OA, A. Fib, presented to the hospital due to right upper extremity swelling and redness and pain and noted to have cellulitis.   1. Right upper extremity cellulitis-this is the cause of patient's redness and swelling and pain. -Patient was seen by general surgery and status post incision and drainage of the right upper extremity cellulitis with abscess. Postop day #1 today. -Continue IV vancomycin, follow intraoperative cultures. Possible DC home  tomorrow on oral antibiotics and doing well.  2. History of atrial fibrillation-rate controlled. - cont. Toprol, Eliquis  3. HTN - cont. Toprol, Lisinopril.   4. Depression - cont. Prozac  5. Hyperlipidemia - cont. Pravachol.    All  the records are reviewed and case discussed with Care Management/Social Worker. Management plans discussed with the patient, family and they are in agreement.  CODE STATUS: Full code  DVT Prophylaxis: Eliquis  TOTAL TIME TAKING CARE OF THIS PATIENT: 30 minutes.   POSSIBLE D/C IN 1-2 DAYS, DEPENDING ON CLINICAL CONDITION.   Henreitta Leber M.D on 01/30/2017 at 1:31 PM  Between 7am to 6pm - Pager - [phone]  After 6pm go to [email or web address] - Proofreader  [facility]  Office  [phone]  CC: Primary care physician; Einar Pheasant, MD

## 2017-01-30 NOTE — Progress Notes (Signed)
PT Cancellation Note  Patient Details Name: Kristin Avila MRN: [id number] DOB: September 30, 1927   Cancelled Treatment:    Reason Eval/Treat Not Completed: Other (comment)   Chart reviewed.  Pt with surgery yesterday with general anesthesia.  Per therapy protocols, discharge of PT at this time and will await new orders if appropriate.   Chesley Noon 01/30/2017, 10:07 AM

## 2017-01-30 NOTE — Anesthesia Postprocedure Evaluation (Signed)
Anesthesia Post Note  Patient: Kristin Avila  Procedure(s) Performed: Procedure(s) (LRB): INCISION AND DRAINAGE ABSCESS (Right)  Patient location during evaluation: PACU Anesthesia Type: General Level of consciousness: awake and alert and oriented Pain management: pain level controlled Vital Signs Assessment: post-procedure vital signs reviewed and stable Respiratory status: spontaneous breathing and respiratory function stable Cardiovascular status: blood pressure returned to baseline Anesthetic complications: no     Last Vitals:  Vitals:   01/30/17 0439 01/30/17 1056  BP: 127/65 127/77  Pulse: (!) 101 (!) 106  Resp: 16   Temp: 36.3 C     Last Pain:  Vitals:   01/30/17 1156  TempSrc:   PainSc: 0-No pain                 Fahad Cisse

## 2017-01-31 ENCOUNTER — Encounter: Payer: Self-pay | Admitting: Surgery

## 2017-01-31 LAB — CBC
HCT: 34.3 % — ABNORMAL LOW (ref 35.0–47.0)
HEMOGLOBIN: 11.4 g/dL — AB (ref 12.0–16.0)
MCH: 28.6 pg (ref 26.0–34.0)
MCHC: 33.3 g/dL (ref 32.0–36.0)
MCV: 85.8 fL (ref 80.0–100.0)
PLATELETS: 256 10*3/uL (ref 150–440)
RBC: 3.99 MIL/uL (ref 3.80–5.20)
RDW: 15.9 % — ABNORMAL HIGH (ref 11.5–14.5)
WBC: 15.4 10*3/uL — ABNORMAL HIGH (ref 3.6–11.0)

## 2017-01-31 LAB — VANCOMYCIN, TROUGH: VANCOMYCIN TR: 11 ug/mL — AB (ref 15–20)

## 2017-01-31 LAB — POTASSIUM: POTASSIUM: 4.1 mmol/L (ref 3.5–5.1)

## 2017-01-31 MED ORDER — AMOXICILLIN-POT CLAVULANATE 875-125 MG PO TABS: 1.0000 | ORAL_TABLET | Freq: Two times a day (BID) | ORAL | 0 refills | Status: AC

## 2017-01-31 MED ORDER — VANCOMYCIN HCL IN DEXTROSE 750-5 MG/150ML-% IV SOLN
750.0000 mg | Freq: Two times a day (BID) | INTRAVENOUS | Status: DC
  Filled 2017-01-31: qty 150

## 2017-01-31 NOTE — Progress Notes (Signed)
MD ordered patient to be discharged home.  Discharge instructions were reviewed with the patient and family and they voiced understanding.  Patient was instructed on making her follow-up appointment.  Prescription given to the patient.  IV was removed with catheter intact.  All patients questions were answered.  Patient sent home with some supplies for dressing change.  Patient leaving via wheelchair escorted by nurse tech.

## 2017-01-31 NOTE — Discharge Summary (Signed)
[place] [facility] [place] NAME: Kristin Avila    MR#:  [id number]  DATE OF BIRTH:  01-20-1928  DATE OF ADMISSION:  01/27/2017 ADMITTING PHYSICIAN: Harvie Bridge, DO  DATE OF DISCHARGE: 01/31/2017 11:26 AM  PRIMARY CARE PHYSICIAN: Einar Pheasant, MD    ADMISSION DIAGNOSIS:  Cellulitis of right upper extremity [L89.373]  DISCHARGE DIAGNOSIS:  Active Problems:   Cellulitis of right upper extremity   SECONDARY DIAGNOSIS:   Past Medical History:  Diagnosis Date  . Anemia   . Atrial fibrillation ([place]) 11/13   new onset 11/13  . Hypercholesterolemia   . Hypertension   . Osteoarthritis    hands, feet    HOSPITAL COURSE:   83 yo female HTN, Hyperlipidemia, OA, A. Fib, presented to the hospital due to right upper extremity swelling and redness and pain and noted to have cellulitis.   1. Right upper extremity cellulitis-this was the cause of patient's redness and swelling and pain. -Patient was seen by general surgery and is status post incision and drainage of the right upper extremity cellulitis with abscess. Postop day #2 today. Clinically doing much better. Afebrile, And seen by surgery who recommending discharge patient on oral antibiotics and follow up with them next week. Patient was discharged on oral Augmentin for additional 10 days. Her intra-operative cultures have remained negative.  2. History of atrial fibrillation-rate controlled and she will cont. Toprol.  - she will resume her Eliquis  3. HTN - she will cont. Toprol, Lisinopril.   4. Depression - she will  cont. Prozac  5. Hyperlipidemia - she will cont. Pravachol.   DISCHARGE CONDITIONS:   Stable.   CONSULTS OBTAINED:  Treatment Team:  Florene Glen, MD Leonel Ramsay, MD  DRUG ALLERGIES:  No Known Allergies  DISCHARGE MEDICATIONS:   Allergies as of 01/31/2017   No Known Allergies     Medication List    TAKE these medications    acetaminophen 325 MG tablet Commonly known as:  TYLENOL Take 2 tablets (650 mg total) by mouth every 6 (six) hours as needed for mild pain, moderate pain or fever (or Fever >/= 101).   amLODipine 10 MG tablet Commonly known as:  NORVASC Take 1 tablet (10 mg total) by mouth daily.   amoxicillin-clavulanate 875-125 MG tablet Commonly known as:  AUGMENTIN Take 1 tablet by mouth every 12 (twelve) hours.   aspirin 81 MG tablet Take 81 mg by mouth daily.   ELIQUIS 5 MG Tabs tablet Generic drug:  apixaban Take 5 mg by mouth 2 (two) times daily.   FLUoxetine 40 MG capsule Commonly known as:  PROZAC Take 1 capsule (40 mg total) by mouth daily.   lisinopril 40 MG tablet Commonly known as:  PRINIVIL,ZESTRIL Take 1 tablet (40 mg total) by mouth daily.   lovastatin 20 MG tablet Commonly known as:  MEVACOR Take 1 tablet (20 mg total) by mouth daily.   metoprolol succinate 50 MG 24 hr tablet Commonly known as:  TOPROL-XL TAKE (1) TABLET BY MOUTH DAILY. TAKE WITH OR IMMEDIATELY FOLLOWING A MEAL   naproxen sodium 220 MG tablet Commonly known as:  ANAPROX Take 220 mg by mouth 2 (two) times daily with a meal.   senna-docusate 8.6-50 MG tablet Commonly known as:  Senokot-S Take 1 tablet by mouth at bedtime as needed for mild constipation.   traZODone 100 MG tablet Commonly known as:  DESYREL Take 1 tablet (100 mg total) by mouth at bedtime.  Vitamin D-3 1000 units Caps Take 1 capsule by mouth daily.         DISCHARGE INSTRUCTIONS:   DIET:  Cardiac diet  DISCHARGE CONDITION:  Stable  ACTIVITY:  Activity as tolerated  OXYGEN:  Home Oxygen: No.   Oxygen Delivery: room air  DISCHARGE LOCATION:  home   If you experience worsening of your admission symptoms, develop shortness of breath, life threatening emergency, suicidal or homicidal thoughts you must seek medical attention immediately by calling 911 or calling your MD immediately  if symptoms less severe.  You  Must read complete instructions/literature along with all the possible adverse reactions/side effects for all the Medicines you take and that have been prescribed to you. Take any new Medicines after you have completely understood and accpet all the possible adverse reactions/side effects.   Please note  You were cared for by a hospitalist during your hospital stay. If you have any questions about your discharge medications or the care you received while you were in the hospital after you are discharged, you can call the unit and asked to speak with the hospitalist on call if the hospitalist that took care of you is not available. Once you are discharged, your primary care physician will handle any further medical issues. Please note that NO REFILLS for any discharge medications will be authorized once you are discharged, as it is imperative that you return to your primary care physician (or establish a relationship with a primary care physician if you do not have one) for your aftercare needs so that they can reassess your need for medications and monitor your lab values.     Today   No acute events overnight. Afebrile. Right upper extremity swelling and redness has improved.  VITAL SIGNS:  Blood pressure 125/74, pulse (!) 119, temperature 97.7 F (36.5 C), temperature source Oral, resp. rate 18, height 5\' 2"  (1.575 m), weight 74.8 kg (164 lb 14.4 oz), SpO2 98 %.  I/O:   Intake/Output Summary (Last 24 hours) at 01/31/17 1237 Last data filed at 01/31/17 1053  Gross per 24 hour  Intake          2465.09 ml  Output             2700 ml  Net          -234.91 ml    PHYSICAL EXAMINATION:   GENERAL:  83 y.o.-year-old patient sitting up in chair in no acute distress.  EYES: Pupils equal, round, reactive to light and accommodation. No scleral icterus. Extraocular muscles intact.  HEENT: Head atraumatic, normocephalic. Oropharynx and nasopharynx clear.  NECK:  Supple, no jugular venous  distention. No thyroid enlargement, no tenderness.  LUNGS: Normal breath sounds bilaterally, no wheezing, rales, rhonchi. No use of accessory muscles of respiration.  CARDIOVASCULAR: S1, S2 normal. No murmurs, rubs, or gallops.  ABDOMEN: Soft, nontender, nondistended. Bowel sounds present. No organomegaly or mass.  EXTREMITIES: No cyanosis, clubbing or edema b/l. Right upper ext. Dressing in place after I & D.  NEUROLOGIC: Cranial nerves II through XII are intact. No focal Motor or sensory deficits b/l.   PSYCHIATRIC: The patient is alert and oriented x 3.  SKIN: No obvious rash, lesion, or ulcer.   DATA REVIEW:   CBC  Recent Labs Lab 01/31/17 0351  WBC 15.4*  HGB 11.4*  HCT 34.3*  PLT 256    Chemistries   Recent Labs Lab 01/27/17 2113  01/29/17 0519 01/31/17 0351  NA 135  < > 135  --  K 4.0  < > 3.2* 4.1  CL 104  < > 109  --   CO2 25  < > 19*  --   GLUCOSE 107*  < > 82  --   BUN 22*  < > 17  --   CREATININE 0.80  < > 0.75  --   CALCIUM 8.7*  < > 8.3*  --   AST 24  --   --   --   ALT 15  --   --   --   ALKPHOS 85  --   --   --   BILITOT 1.0  --   --   --   < > = values in this interval not displayed.  Cardiac Enzymes No results for input(s): TROPONINI in the last 168 hours.  Microbiology Results  Results for orders placed or performed during the hospital encounter of 01/27/17  Culture, blood (Routine x 2)     Status: None (Preliminary result)   Collection Time: 01/27/17  8:28 PM  Result Value Ref Range Status   Specimen Description BLOOD [facility]  Final   Special Requests   Final    BOTTLES DRAWN AEROBIC AND ANAEROBIC Blood Culture results may not be optimal due to an inadequate volume of blood received in culture bottles   Culture NO GROWTH 4 DAYS  Final   Report Status PENDING  Incomplete  Culture, blood (Routine x 2)     Status: None (Preliminary result)   Collection Time: 01/27/17  8:38 PM  Result Value Ref Range Status   Specimen Description BLOOD LAC   Final   Special Requests   Final    BOTTLES DRAWN AEROBIC AND ANAEROBIC Blood Culture adequate volume   Culture NO GROWTH 4 DAYS  Final   Report Status PENDING  Incomplete  Surgical PCR screen     Status: None   Collection Time: 01/29/17  1:57 PM  Result Value Ref Range Status   MRSA, PCR NEGATIVE NEGATIVE Final   Staphylococcus aureus NEGATIVE NEGATIVE Final    Comment:        The Xpert SA Assay (FDA approved for NASAL specimens in patients over 5 years of age), is one component of a comprehensive surveillance program.  Test performance has been validated by [facility] for patients greater than or equal to 21 year old. It is not intended to diagnose infection nor to guide or monitor treatment.   Aerobic Culture (superficial specimen)     Status: None (Preliminary result)   Collection Time: 01/29/17  1:57 PM  Result Value Ref Range Status   Specimen Description ARM RIGHT  Final   Special Requests NONE  Final   Gram Stain   Final    FEW WBC PRESENT, PREDOMINANTLY PMN RARE GRAM POSITIVE COCCI IN CLUSTERS    Culture   Final    CULTURE REINCUBATED FOR BETTER GROWTH Performed at [facility], [place] [address], [place], [place] [address]    Report Status PENDING  Incomplete  Aerobic/Anaerobic Culture (surgical/deep wound)     Status: None (Preliminary result)   Collection Time: 01/29/17  4:32 PM  Result Value Ref Range Status   Specimen Description ABSCESS  Final   Special Requests RIGHT ARM  Final   Gram Stain   Final    FEW WBC PRESENT, PREDOMINANTLY PMN NO ORGANISMS SEEN    Culture   Final    NO GROWTH 1 DAY Performed at [facility], [place] [address],  [place], [place] [address]    Report Status PENDING  Incomplete    RADIOLOGY:  No results found.    Management plans discussed with the patient, family and they are in agreement.  CODE STATUS:     Code Status Orders        Start     Ordered   01/27/17 2319  Full code  Continuous     01/27/17  2318    Code Status History    Date Active Date Inactive Code Status Order ID Comments User Context   12/08/2016 10:14 PM 12/15/2016  3:09 PM Full Code [id number]  Demetrios Loll, MD ED    Advance Directive Documentation     Most Recent Value  Type of Advance Directive  Living will  Pre-existing out of facility DNR order (yellow form or pink MOST form)  -  "MOST" Form in Place?  -      TOTAL TIME TAKING CARE OF THIS PATIENT: 40 minutes.    Henreitta Leber M.D on 01/31/2017 at 12:37 PM  Between 7am to 6pm - Pager - [phone]  After 6pm go to [email or web address] - Proofreader  [facility]  Office  [phone]  CC: Primary care physician; Einar Pheasant, MD

## 2017-01-31 NOTE — Progress Notes (Signed)
Pharmacy Antibiotic Note  Kristin Avila is a 83 y.o. female admitted on 01/27/2017 with cellulitis.  Pharmacy has been consulted for vanc/cefazolin dosing.  Plan: Patient received vanc 1g IV x 1 in ED Will follow up w/ vanc 1.25g IV q24h  Ke 0.0420 T1/2 16 ~ 24 hrs for ease of admin  6/24 @ 0500 VT 11 therapeutic for goal range of 10 - 15 mcg/mL and level drawn appropriately; however, white count is continuing to increase -- will target a goal range of 15 - 20 mcg/mL. Will increase dose to vanc 750 mg IV q12h to target a Css of 18 mcg/mL. Will draw a vanc trough 6/25 @ 1600 prior to 4th dose. Ke 0.0440 T1/2 15 hours  Will continue cefazolin 1g IV q8h  Height: 5\' 2"  (157.5 cm) Weight: 164 lb 14.4 oz (74.8 kg) IBW/kg (Calculated) : 50.1  Temp (24hrs), Avg:97.8 F (36.6 C), Min:97.4 F (36.3 C), Max:98.2 F (36.8 C)   Recent Labs Lab 01/27/17 2008 01/27/17 2026 01/27/17 2113 01/27/17 2337 01/28/17 0439 01/29/17 0519 01/31/17 0351  WBC  --  13.5*  --   --  12.7* 14.1* 15.4*  CREATININE  --   --  0.80  --  0.69 0.75  --   LATICACIDVEN 0.8  --   --  0.8  --   --   --   VANCOTROUGH  --   --   --   --   --   --  11*    Estimated Creatinine Clearance: 47.8 mL/min (by C-G formula based on SCr of 0.75 mg/dL).    No Known Allergies   Thank you for allowing pharmacy to be a part of this patient's care.  Tobie Lords, PharmD, BCPS Clinical Pharmacist 01/31/2017

## 2017-01-31 NOTE — Discharge Instructions (Signed)
Dressing Change--  Daily- Remove old dressing and place new gauze and abd pad then wrap with kerlex and ace bandage.

## 2017-01-31 NOTE — Care Management Note (Signed)
Case Management Note  Patient Details  Name: ADITI ROVIRA MRN: [id number] Date of Birth: 1928/01/16  Subjective/Objective:      Call to Sarah at [facility] to resume home health services.               Action/Plan:   Expected Discharge Date:  01/31/17               Expected Discharge Plan:     In-House Referral:     Discharge planning Services     Post Acute Care Choice:    Choice offered to:     DME Arranged:    DME Agency:     HH Arranged:    HH Agency:     Status of Service:     If discussed at [employer], dates discussed:    Additional Comments:  Dianne Bady A, RN 01/31/2017, 9:03 AM

## 2017-01-31 NOTE — Progress Notes (Signed)
2 Days Post-Op  Subjective: Status post I&D of right arm abscess. Patient feels well she has no pain and no fevers or chills*  Objective: Vital signs in last 24 hours: Temp:  [97.4 F (36.3 C)-98.2 F (36.8 C)] 97.7 F (36.5 C) (06/24 0349) Pulse Rate:  [59-106] 97 (06/24 0349) Resp:  [18-20] 18 (06/24 0349) BP: (111-147)/(55-84) 147/84 (06/24 0349) SpO2:  [93 %-100 %] 93 % (06/24 0349) Last BM Date: 01/30/17  Intake/Output from previous day: 06/23 0701 - 06/24 0700 In: 2913.1 [P.O.:1200; I.V.:1713.1] Out: 2650 [Urine:2650] Intake/Output this shift: Total I/O In: -  Out: 200 [Urine:200]  Physical exam:  Vital signs are stable she is afebrile much less erythema much less edema drain in place with no purulence. Function of the hand  Lab Results: CBC   Recent Labs  01/29/17 0519 01/31/17 0351  WBC 14.1* 15.4*  HGB 11.4* 11.4*  HCT 34.2* 34.3*  PLT 183 256   BMET  Recent Labs  01/29/17 0519 01/31/17 0351  NA 135  --   K 3.2* 4.1  CL 109  --   CO2 19*  --   GLUCOSE 82  --   BUN 17  --   CREATININE 0.75  --   CALCIUM 8.3*  --    PT/INR No results for input(s): LABPROT, INR in the last 72 hours. ABG No results for input(s): PHART, HCO3 in the last 72 hours.  Invalid input(s): PCO2, PO2  Studies/Results: No results found.  Anti-infectives: Anti-infectives    Start     Dose/Rate Route Frequency Ordered Stop   01/31/17 1700  vancomycin (VANCOCIN) IVPB 750 mg/150 ml premix     750 mg 150 mL/hr over 60 Minutes Intravenous Every 12 hours 01/31/17 0512     01/28/17 0530  ceFAZolin (ANCEF) IVPB 1 g/50 mL premix     1 g 100 mL/hr over 30 Minutes Intravenous Every 8 hours 01/27/17 2341     01/28/17 0430  vancomycin (VANCOCIN) 1,250 mg in sodium chloride 0.9 % 250 mL IVPB  Status:  Discontinued     1,250 mg 166.7 mL/hr over 90 Minutes Intravenous Every 24 hours 01/27/17 2341 01/31/17 0512   01/27/17 2115  ceFAZolin (ANCEF) IVPB 1 g/50 mL premix     1  g 100 mL/hr over 30 Minutes Intravenous  Once 01/27/17 2103 01/27/17 2155   01/27/17 2115  vancomycin (VANCOCIN) IVPB 1000 mg/200 mL premix     1,000 mg 200 mL/hr over 60 Minutes Intravenous  Once 01/27/17 2109 01/27/17 2234      Assessment/Plan: s/p Procedure(s): INCISION AND DRAINAGE ABSCESS   White blood cell count remains elevated but her cultures are no growth so far. She is feeling well. Her wounds and arm are much improved. She can likely be discharged today on oral antibiotics to follow-up with me midweek for Penrose drain removal.  Florene Glen, MD, FACS  01/31/2017

## 2017-02-01 ENCOUNTER — Telehealth: Payer: Self-pay | Admitting: Internal Medicine

## 2017-02-01 DIAGNOSIS — E782 Mixed hyperlipidemia: Secondary | ICD-10-CM | POA: Insufficient documentation

## 2017-02-01 HISTORY — DX: Mixed hyperlipidemia: E78.2

## 2017-02-01 LAB — AEROBIC CULTURE W GRAM STAIN (SUPERFICIAL SPECIMEN)

## 2017-02-01 LAB — CULTURE, BLOOD (ROUTINE X 2)
Culture: NO GROWTH
Culture: NO GROWTH
SPECIAL REQUESTS: ADEQUATE

## 2017-02-01 LAB — AEROBIC CULTURE  (SUPERFICIAL SPECIMEN)

## 2017-02-01 NOTE — Telephone Encounter (Signed)
First, attempt at [facility] called patient line busy , will continue to call.

## 2017-02-02 ENCOUNTER — Encounter: Payer: Self-pay | Admitting: Surgery

## 2017-02-02 ENCOUNTER — Ambulatory Visit (INDEPENDENT_AMBULATORY_CARE_PROVIDER_SITE_OTHER): Payer: Medicare Other | Admitting: Surgery

## 2017-02-02 VITALS — BP 176/96 | HR 101 | Temp 97.7°F | Ht 62.0 in | Wt 163.0 lb

## 2017-02-02 DIAGNOSIS — L03113 Cellulitis of right upper limb: Secondary | ICD-10-CM

## 2017-02-02 NOTE — Patient Instructions (Signed)
We will see you back on Friday with Dr. Burt Knack. Your wound looks great today but we have left your drain in. You may shower but do not submerge in any water until you have been told to.  Keep a dressing over this area, changing this daily, until seen back on Friday.  Call with any questions or concerns prior to your appointment.

## 2017-02-02 NOTE — Telephone Encounter (Signed)
Discharged 01/31/17 need appt date within 14 days, Please mark appointment TCM completed. You have an 11 on the 02/11/17.

## 2017-02-02 NOTE — Telephone Encounter (Signed)
Transition Care Management Follow-up Telephone Call  How have you been since you were released from the hospital? Patient stated she has felt well since discharge.   Do you understand why you were in the hospital? Yes   Do you understand the discharge instrcutions? Yes  Items Reviewed:  Medications reviewed: yes  Allergies reviewed:yes  Dietary changes reviewed: yes  Referrals reviewed: yes, Follow up with surgeon,  Dr. Phoebe Perch   Functional Questionnaire:   Activities of Daily Living (ADLs):   She states they are independent in the following: All ADLs States they require assistance with the following: No assist required   Any transportation issues/concerns?: No   Any patient concerns? No   Confirmed importance and date/time of follow-up visits scheduled: Yes  Confirmed with patient if condition begins to worsen call PCP or go to the ER.  Patient was given the Call-a-Nurse line [phone]

## 2017-02-02 NOTE — Progress Notes (Signed)
Surgical Clinic Progress/Follow-up Note   HPI:  83 y.o. Female presents to clinic for post-op follow-up evaluation of Right arm 4 days s/p incision and drainage for cellulitis with underlying fluctuance, concern for abscess without purulent fluid obtained intraoperatively. Patient reports resolution of the pain and fevers for which she initially presented to [facility] ED, otherwise denies N/V, diarrhea, CP, or SOB.  Review of Systems:  Constitutional: denies any other weight loss, fever, chills, or sweats  Eyes: denies any other vision changes, history of eye injury  ENT: denies sore throat, hearing problems  Respiratory: denies shortness of breath, wheezing  Cardiovascular: denies chest pain, palpitations  Gastrointestinal: denies abdominal pain, N/V, or diarrhea Musculoskeletal: denies any other joint pains or cramps  Skin: Denies any other rashes or skin discolorations  Neurological: denies any other headache, dizziness, weakness  Psychiatric: denies any other depression, anxiety  All other review of systems: otherwise negative   Vital Signs:  BP (!) 176/96   Pulse (!) 101   Temp 97.7 F (36.5 C) (Oral)   Ht 5\' 2"  (1.575 m)   Wt 163 lb (73.9 kg)   BMI 29.81 kg/m    Physical Exam:  Constitutional:  -- Normal body habitus  -- Awake, alert, and oriented x3  Eyes:  -- Pupils equally round and reactive to light  -- No scleral icterus  Ear, nose, throat:  -- No jugular venous distension  -- No nasal drainage, bleeding Pulmonary:  -- No crackles -- Equal breath sounds bilaterally -- Breathing non-labored at rest Cardiovascular:  -- S1, S2 present  -- No pericardial rubs  Gastrointestinal:  -- Soft, nontender, nondistended, no guarding/rebound  -- No abdominal masses appreciated, pulsatile or otherwise  Musculoskeletal / Integumentary:  -- Wounds or skin discoloration: Non-tender mild-/moderate- erythema overlying drained Right forearm with well-secured penrose drain and a  small amount of fibrinous exudate/film around the surgical incision and drain  -- Extremities: B/L UE and LE FROM, hands and feet warm, no edema  Neurologic:  -- Motor function: intact and symmetric  -- Sensation: intact and symmetric   Laboratory studies:  CBC Latest Ref Rng & Units 01/31/2017 01/29/2017 01/28/2017  WBC 3.6 - 11.0 K/uL 15.4(H) 14.1(H) 12.7(H)  Hemoglobin 12.0 - 16.0 g/dL 11.4(L) 11.4(L) 12.2  Hematocrit 35.0 - 47.0 % 34.3(L) 34.2(L) 36.8  Platelets 150 - 440 K/uL 256 183 207   Deep Wound / Abscess Cultures (obtained 6/22, finalized 6/25):  Gram Stain FEW WBC PRESENT, PREDOMINANTLY PMN  RARE GRAM POSITIVE COCCI IN CLUSTERS      Culture RARE GROUP A STREP (S.PYOGENES) ISOLATED    Assessment:  83 y.o. yo Female with a problem list including...  Patient Active Problem List   Diagnosis Date Noted  . Hyperlipidemia, mixed 02/01/2017  . Cellulitis of right upper extremity 01/27/2017  . Sick sinus syndrome ([place]) 01/04/2017  . Syncope 12/08/2016  . BCC (basal cell carcinoma), face 04/24/2016  . Decreased hearing 05/21/2015  . Benign essential hypertension 03/13/2015  . Constipation 01/13/2015  . Health care maintenance 10/14/2014  . Anxiety 10/02/2012  . Atrial fibrillation ([place]) 07/09/2012  . Osteoarthritis 07/08/2012  . Hypercholesterolemia 07/08/2012  . Hypertension 07/08/2012    presents to clinic for post-op follow-up evaluation of Right arm, doing overall well 4 days s/p incision and drainage for cellulitis with underlying fluctuance, concern for abscess without purulent fluid obtained intraoperatively.  Plan:   - fibrinous exudate/film sharply debrided using scissors  - complete prescribed course of antibiotics even if feeling better  -  follow-up with operating surgeon Dr. Burt Knack later this week, likely including drain removal  - culture results reviewed and discussed with patient and her daughter (bedside)  All of the above recommendations were discussed  with the patient and patient's daughter, and all of patient's and her daughter's questions were answered to their expressed satisfaction.  -- Marilynne Drivers Rosana Hoes, MD, [place]: [facility] - Partnering for exceptional care. Office: [phone]

## 2017-02-02 NOTE — Telephone Encounter (Signed)
Ok.  To schedule for this time.  Please schedule and notify pt.

## 2017-02-02 NOTE — Telephone Encounter (Signed)
Left message for patient to return call to office. 

## 2017-02-03 LAB — AEROBIC/ANAEROBIC CULTURE (SURGICAL/DEEP WOUND): CULTURE: NO GROWTH

## 2017-02-03 LAB — AEROBIC/ANAEROBIC CULTURE W GRAM STAIN (SURGICAL/DEEP WOUND)

## 2017-02-03 NOTE — Telephone Encounter (Signed)
Need an appointment date and time could not reach patient with appointment date listed by PCP now appointment has been taken.

## 2017-02-03 NOTE — Telephone Encounter (Signed)
Called patient put in app

## 2017-02-03 NOTE — Telephone Encounter (Signed)
Left message for patient to call office.  

## 2017-02-03 NOTE — Telephone Encounter (Signed)
Per cma patient has been scheduled.

## 2017-02-03 NOTE — Telephone Encounter (Signed)
I put a hold on 02/09/17 @ 3:30

## 2017-02-04 ENCOUNTER — Other Ambulatory Visit: Payer: Self-pay

## 2017-02-05 ENCOUNTER — Encounter: Payer: Self-pay | Admitting: Surgery

## 2017-02-05 ENCOUNTER — Ambulatory Visit (INDEPENDENT_AMBULATORY_CARE_PROVIDER_SITE_OTHER): Payer: Medicare Other | Admitting: Surgery

## 2017-02-05 VITALS — BP 126/74 | HR 61 | Temp 97.8°F | Ht 62.0 in | Wt 165.8 lb

## 2017-02-05 DIAGNOSIS — L03113 Cellulitis of right upper limb: Secondary | ICD-10-CM

## 2017-02-05 NOTE — Progress Notes (Signed)
Outpatient postop visit  02/05/2017  Kristin Avila is an 83 y.o. female.    Procedure: Incision and drainage of right forearm abscess  CC: Feels well  HPI: This patient status post incision and drainage of right forearm abscess. Cultures had been negative and a Penrose drain is in place. Patient has no complaints at this time.  Medications reviewed.    Physical Exam:  There were no vitals taken for this visit.    PE: Afebrile wound is clean the Penrose drain is in place and there is no purulence. The erythema is much improved although there is still some persistent erythema at the elbow the massive edema and swelling and erythema has largely resolved. She has full function of her hand.    Assessment/Plan:  Cultures negative. Patient doing very well recommend removing the Penrose drain today which was performed and she will follow-up next week.  Florene Glen, MD, FACS

## 2017-02-05 NOTE — Patient Instructions (Signed)
Please see your follow up appointment listed below. 02/12/17 @ 8:45 am.

## 2017-02-09 ENCOUNTER — Encounter: Payer: Self-pay | Admitting: Internal Medicine

## 2017-02-09 ENCOUNTER — Ambulatory Visit (INDEPENDENT_AMBULATORY_CARE_PROVIDER_SITE_OTHER): Payer: Medicare Other | Admitting: Internal Medicine

## 2017-02-09 DIAGNOSIS — I4891 Unspecified atrial fibrillation: Secondary | ICD-10-CM

## 2017-02-09 DIAGNOSIS — I10 Essential (primary) hypertension: Secondary | ICD-10-CM

## 2017-02-09 DIAGNOSIS — L03113 Cellulitis of right upper limb: Secondary | ICD-10-CM | POA: Diagnosis not present

## 2017-02-09 DIAGNOSIS — I495 Sick sinus syndrome: Secondary | ICD-10-CM

## 2017-02-09 NOTE — Progress Notes (Signed)
Patient ID: Kristin Avila, female   DOB: 08-Jan-1928, 83 y.o.   MRN: [id number]   Subjective:    Patient ID: Kristin Avila, female    DOB: [birth year], 83 y.o.   MRN: [id number]  HPI  Patient here for a hospital follow up.  She is accompanied by her niece.  History obtained from both of them.  She was admitted with cellulitis of right upper extremity.  Is s/p I&D right forearmm.  She is being followed by surgery.  Was on IV antibiotics and discharged on oral abx.  Tolerating.  Eating.  Discussed the need to stay hydrated.  Also discussed the need for probiotics.  No chest pain.  No sob.  No abdominal pain.  Bowels moving.  Arm feels better.  Decreased pain.  Still being followed by surgery.     Past Medical History:  Diagnosis Date  . Anemia   . Anxiety 10/02/2012  . Atrial fibrillation ([place]) 11/13   new onset 11/13  . BCC (basal cell carcinoma), face 04/24/2016  . Benign essential hypertension 03/13/2015  . Cellulitis of right upper extremity 01/27/2017  . Constipation 01/13/2015  . Decreased hearing 05/21/2015  . Health care maintenance 10/14/2014   Mammogram 10/01/15 - Birads I.    . Hypercholesterolemia   . Hyperlipidemia, mixed 02/01/2017  . Hypertension   . Osteoarthritis    hands, feet  . Sick sinus syndrome ([place]) 01/04/2017   Overview:  Sp pacer placement 2018  . Syncope 12/08/2016   Past Surgical History:  Procedure Laterality Date  . APPENDECTOMY  1957  . CATARACT EXTRACTION  2013   left eye  . INCISION AND DRAINAGE ABSCESS Right 01/29/2017   Procedure: INCISION AND DRAINAGE ABSCESS;  Surgeon: Florene Glen, MD;  Location: [facility] ORS;  Service: General;  Laterality: Right;  . KIDNEY SURGERY  age 68  . PACEMAKER INSERTION Left 12/14/2016   Procedure: INSERTION PACEMAKER;  Surgeon: Isaias Cowman, MD;  Location: [facility] ORS;  Service: Cardiovascular;  Laterality: Left;  . TUBAL LIGATION     Family History  Problem Relation Age of Onset  . Congestive Heart Failure Father    . Multiple myeloma Mother   . Hypertension Brother   . Alzheimer's disease Unknown        grandmother and great aunts  . Breast cancer Neg Hx   . Colon cancer Neg Hx    Social History   Social History  . Marital status: Widowed    Spouse name: N/A  . Number of children: 4  . Years of education: N/A   Social History Main Topics  . Smoking status: Never Smoker  . Smokeless tobacco: Never Used  . Alcohol use No     Comment: occasional  . Drug use: No  . Sexual activity: Not Asked   Other Topics Concern  . None   Social History Narrative  . None    Outpatient Encounter Prescriptions as of 02/09/2017  Medication Sig  . acetaminophen (TYLENOL) 325 MG tablet Take 2 tablets (650 mg total) by mouth every 6 (six) hours as needed for mild pain, moderate pain or fever (or Fever >/= 101).  Marland Kitchen amLODipine (NORVASC) 10 MG tablet Take 1 tablet (10 mg total) by mouth daily.  . [EXPIRED] amoxicillin-clavulanate (AUGMENTIN) 875-125 MG tablet Take 1 tablet by mouth every 12 (twelve) hours.  . Cholecalciferol (VITAMIN D-3) 1000 UNITS CAPS Take 1 capsule by mouth daily.   Marland Kitchen ELIQUIS 5 MG TABS tablet Take 5 mg by mouth  2 (two) times daily.  Marland Kitchen FLUoxetine (PROZAC) 40 MG capsule Take 1 capsule (40 mg total) by mouth daily.  . hydrochlorothiazide (HYDRODIURIL) 25 MG tablet Take 0.5 tablets by mouth daily.  Marland Kitchen lisinopril (PRINIVIL,ZESTRIL) 40 MG tablet Take 1 tablet (40 mg total) by mouth daily.  Marland Kitchen lovastatin (MEVACOR) 20 MG tablet Take 1 tablet (20 mg total) by mouth daily.  . metoprolol succinate (TOPROL-XL) 50 MG 24 hr tablet TAKE (1) TABLET BY MOUTH DAILY. TAKE WITH OR IMMEDIATELY FOLLOWING A MEAL  . naproxen sodium (ANAPROX) 220 MG tablet Take 220 mg by mouth 2 (two) times daily with a meal.  . senna-docusate (SENOKOT-S) 8.6-50 MG tablet Take 1 tablet by mouth at bedtime as needed for mild constipation.  . traZODone (DESYREL) 100 MG tablet Take 1 tablet (100 mg total) by mouth at bedtime.   No  facility-administered encounter medications on file as of 02/09/2017.     Review of Systems  Constitutional: Negative for appetite change and unexpected weight change.  HENT: Negative for congestion and sinus pressure.   Respiratory: Negative for cough, chest tightness and shortness of breath.   Cardiovascular: Negative for chest pain, palpitations and leg swelling.  Gastrointestinal: Negative for abdominal pain, diarrhea, nausea and vomiting.  Skin:       Open incision - right arm.  Some tenderness to palpation.  Reports pain significantly improved.  Reports decreased erythema and swelling.    Neurological: Negative for dizziness, light-headedness and headaches.  Psychiatric/Behavioral: Negative for agitation and dysphoric mood.       Objective:    Physical Exam  Constitutional: She appears well-developed and well-nourished. No distress.  Neck: Neck supple.  Cardiovascular: Normal rate and regular rhythm.   Pulmonary/Chest: Breath sounds normal. No respiratory distress. She has no wheezes.  Abdominal: Soft. Bowel sounds are normal. There is no tenderness.  Musculoskeletal:  Still with some soft tissue swelling - right arm (reported improved).  No pain with flexion and extension of arm.    Lymphadenopathy:    She has no cervical adenopathy.  Skin:  Open incision - right arm.  Still with some erythema (improved).    Psychiatric: She has a normal mood and affect. Her behavior is normal.    BP 124/68 (BP Location: Left Arm, Patient Position: Sitting, Cuff Size: Normal)   Pulse (!) 56   Temp 97.9 F (36.6 C) (Oral)   Resp 12   Ht 5' 2"  (1.575 m)   Wt 169 lb (76.7 kg)   SpO2 96%   BMI 30.91 kg/m  Wt Readings from Last 3 Encounters:  02/09/17 169 lb (76.7 kg)  02/05/17 165 lb 12.8 oz (75.2 kg)  02/02/17 163 lb (73.9 kg)     Lab Results  Component Value Date   WBC 15.4 (H) 01/31/2017   HGB 11.4 (L) 01/31/2017   HCT 34.3 (L) 01/31/2017   PLT 256 01/31/2017   GLUCOSE 82  01/29/2017   CHOL 219 (H) 11/26/2016   TRIG 68.0 11/26/2016   HDL 91.90 11/26/2016   LDLDIRECT 105.8 09/06/2013   LDLCALC 113 (H) 11/26/2016   ALT 15 01/27/2017   AST 24 01/27/2017   NA 135 01/29/2017   K 4.1 01/31/2017   CL 109 01/29/2017   CREATININE 0.75 01/29/2017   BUN 17 01/29/2017   CO2 19 (L) 01/29/2017   TSH 1.69 11/26/2016   INR 1.12 01/27/2017    Dg Elbow 2 Views Right  Result Date: 01/28/2017 CLINICAL DATA:  Cellulitis of right arm. Right  elbow pain and swelling. EXAM: RIGHT ELBOW - 2 VIEW COMPARISON:  None. FINDINGS: There is no evidence of fracture, dislocation, or joint effusion. Mild osteoarthritis. There is diffuse soft tissue edema. Questionable soft tissue defect of the posterior elbow. No tracking soft tissue air. No radiopaque foreign body. IMPRESSION: Diffuse soft tissue edema of the elbow with probable skin defect posteriorly, skin breaks reported on available clinical note. No evidence of tracking soft tissue air. Electronically Signed   By: Jeb Levering M.D.   On: 01/28/2017 00:51       Assessment & Plan:   Problem List Items Addressed This Visit    Atrial fibrillation ([place])    Followed by cardiology.  On eliquis.  Doing well.        Cellulitis of right upper extremity    On abx.  Tolerating.  Instructed to take probiotics while on abx and for two weeks after completing.  Swelling and erythema improved.  Pain improved.  Continues to follow up with surgery.  Continue dressing changes.        Hypertension    Blood pressure under good control.  Continue same medication regimen.  Follow pressures.  Follow metabolic panel.        Sick sinus syndrome [facility])    S/p pacemaker placement.  Doing well.  Continue f/u with cardiology.           Einar Pheasant, MD

## 2017-02-09 NOTE — Progress Notes (Signed)
Pre-visit discussion using our clinic review tool. No additional management support is needed unless otherwise documented below in the visit note.  

## 2017-02-12 ENCOUNTER — Telehealth: Payer: Self-pay

## 2017-02-12 ENCOUNTER — Encounter: Payer: Self-pay | Admitting: Internal Medicine

## 2017-02-12 ENCOUNTER — Ambulatory Visit: Payer: Medicare Other | Admitting: Surgery

## 2017-02-12 NOTE — Telephone Encounter (Signed)
Received order from [employer] for dressing supplies. In your note looks like she is still being followed by surgery for that should order go to them? I have put in your blue folder for review.

## 2017-02-12 NOTE — Assessment & Plan Note (Signed)
Blood pressure under good control.  Continue same medication regimen.  Follow pressures.  Follow metabolic panel.   

## 2017-02-12 NOTE — Telephone Encounter (Signed)
I do not mind signing, but since they are following I do feel needs to be sen to them to confirm correct supplies needed, etc

## 2017-02-12 NOTE — Assessment & Plan Note (Signed)
On abx.  Tolerating.  Instructed to take probiotics while on abx and for two weeks after completing.  Swelling and erythema improved.  Pain improved.  Continues to follow up with surgery.  Continue dressing changes.

## 2017-02-12 NOTE — Assessment & Plan Note (Signed)
Followed by cardiology.  On eliquis.  Doing well.

## 2017-02-12 NOTE — Telephone Encounter (Signed)
I have faxed order back to sender with note to send to surgeon

## 2017-02-12 NOTE — Assessment & Plan Note (Signed)
S/p pacemaker placement.  Doing well.  Continue f/u with cardiology.

## 2017-02-15 ENCOUNTER — Ambulatory Visit: Payer: Medicare Other | Admitting: Surgery

## 2017-02-17 ENCOUNTER — Ambulatory Visit: Payer: Medicare Other | Admitting: Internal Medicine

## 2017-02-26 ENCOUNTER — Telehealth: Payer: Self-pay | Admitting: Internal Medicine

## 2017-02-26 NOTE — Telephone Encounter (Signed)
Can I get the auth to work in? Nothing open at this time.

## 2017-02-26 NOTE — Telephone Encounter (Signed)
Pt daughter called and stated that they need to change pt's appt on 8/10. She needs any day the week 14th the latest possible. Please advise, thank you!  Call daughter @ [phone]

## 2017-02-28 NOTE — Telephone Encounter (Signed)
I can do 03/23/17 - but can they do 11:30 - this is the opening we have.

## 2017-03-01 NOTE — Telephone Encounter (Signed)
Called daughter gave all information app made

## 2017-03-10 ENCOUNTER — Ambulatory Visit: Payer: Medicare Other | Admitting: Surgery

## 2017-03-11 ENCOUNTER — Ambulatory Visit: Payer: Medicare Other | Admitting: Surgery

## 2017-03-19 ENCOUNTER — Ambulatory Visit: Payer: Medicare Other | Admitting: Internal Medicine

## 2017-03-22 ENCOUNTER — Telehealth: Payer: Self-pay | Admitting: Internal Medicine

## 2017-03-22 NOTE — Telephone Encounter (Signed)
Left pt message asking to call Allison back directly at [phone] to schedule AWV. Thanks! ° °*NOTE* Never had AWV before °

## 2017-03-23 ENCOUNTER — Ambulatory Visit (INDEPENDENT_AMBULATORY_CARE_PROVIDER_SITE_OTHER): Payer: Medicare Other | Admitting: Internal Medicine

## 2017-03-23 ENCOUNTER — Other Ambulatory Visit: Payer: Self-pay | Admitting: Internal Medicine

## 2017-03-23 ENCOUNTER — Encounter: Payer: Self-pay | Admitting: Internal Medicine

## 2017-03-23 ENCOUNTER — Ambulatory Visit: Payer: Medicare Other | Admitting: Internal Medicine

## 2017-03-23 VITALS — BP 146/82 | HR 60 | Temp 98.6°F | Ht 62.0 in | Wt 165.6 lb

## 2017-03-23 DIAGNOSIS — I495 Sick sinus syndrome: Secondary | ICD-10-CM | POA: Diagnosis not present

## 2017-03-23 DIAGNOSIS — D72829 Elevated white blood cell count, unspecified: Secondary | ICD-10-CM

## 2017-03-23 DIAGNOSIS — I10 Essential (primary) hypertension: Secondary | ICD-10-CM

## 2017-03-23 DIAGNOSIS — E7800 Pure hypercholesterolemia, unspecified: Secondary | ICD-10-CM

## 2017-03-23 DIAGNOSIS — I4891 Unspecified atrial fibrillation: Secondary | ICD-10-CM | POA: Diagnosis not present

## 2017-03-23 DIAGNOSIS — F419 Anxiety disorder, unspecified: Secondary | ICD-10-CM | POA: Diagnosis not present

## 2017-03-23 LAB — HEPATIC FUNCTION PANEL
ALK PHOS: 70 U/L (ref 39–117)
ALT: 9 U/L (ref 0–35)
AST: 17 U/L (ref 0–37)
Albumin: 4.2 g/dL (ref 3.5–5.2)
BILIRUBIN DIRECT: 0.1 mg/dL (ref 0.0–0.3)
TOTAL PROTEIN: 6.4 g/dL (ref 6.0–8.3)
Total Bilirubin: 0.4 mg/dL (ref 0.2–1.2)

## 2017-03-23 LAB — CBC WITH DIFFERENTIAL/PLATELET
BASOS ABS: 0 10*3/uL (ref 0.0–0.1)
Basophils Relative: 0.6 % (ref 0.0–3.0)
EOS ABS: 0.1 10*3/uL (ref 0.0–0.7)
Eosinophils Relative: 1.8 % (ref 0.0–5.0)
HEMATOCRIT: 42.9 % (ref 36.0–46.0)
HEMOGLOBIN: 13.9 g/dL (ref 12.0–15.0)
LYMPHS PCT: 22.1 % (ref 12.0–46.0)
Lymphs Abs: 1.3 10*3/uL (ref 0.7–4.0)
MCHC: 32.3 g/dL (ref 30.0–36.0)
MCV: 86.9 fl (ref 78.0–100.0)
Monocytes Absolute: 0.7 10*3/uL (ref 0.1–1.0)
Monocytes Relative: 11.5 % (ref 3.0–12.0)
Neutro Abs: 3.7 10*3/uL (ref 1.4–7.7)
Neutrophils Relative %: 64 % (ref 43.0–77.0)
PLATELETS: 207 10*3/uL (ref 150.0–400.0)
RBC: 4.94 Mil/uL (ref 3.87–5.11)
RDW: 15.3 % (ref 11.5–15.5)
WBC: 5.7 10*3/uL (ref 4.0–10.5)

## 2017-03-23 LAB — BASIC METABOLIC PANEL
BUN: 21 mg/dL (ref 6–23)
CHLORIDE: 104 meq/L (ref 96–112)
CO2: 29 mEq/L (ref 19–32)
CREATININE: 0.8 mg/dL (ref 0.40–1.20)
Calcium: 10.6 mg/dL — ABNORMAL HIGH (ref 8.4–10.5)
GFR: 72.16 mL/min (ref >60.00)
Glucose, Bld: 77 mg/dL (ref 70–99)
Potassium: 4.2 mEq/L (ref 3.5–5.1)
Sodium: 139 mEq/L (ref 135–145)

## 2017-03-23 NOTE — Progress Notes (Signed)
Patient ID: Kristin Avila, female   DOB: 14-Sep-1927, 83 y.o.   MRN: [id number]   Subjective:    Patient ID: Kristin Avila, female    DOB: 1928/06/23, 83 y.o.   MRN: [id number]  HPI  Patient here for a scheduled follow up.  She is accompanied by her daughter.  History obtained from both of them.  Is s/p pacemaker placement 12/14/16 for sick sinus syndrome and syncope.  On eliquis for afib.  Recently admitted with cellulitis of right upper extremity.  Is s/p I&D right forearm.  Arm has healed.  She is doing better.  Feels better.  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  Discussed staying active.  No chest pain.  No sob.  No acid reflux.  No abdominal pain.  Bowels moving.  No urine change.  Has not been checking her blood pressure often.  States when is checked is ok.  Has f/u with cardiology 04/2017.    Past Medical History:  Diagnosis Date  . Anemia   . Anxiety 10/02/2012  . Atrial fibrillation ([place]) 11/13   new onset 11/13  . BCC (basal cell carcinoma), face 04/24/2016  . Benign essential hypertension 03/13/2015  . Cellulitis of right upper extremity 01/27/2017  . Constipation 01/13/2015  . Decreased hearing 05/21/2015  . Health care maintenance 10/14/2014   Mammogram 10/01/15 - Birads I.    . Hypercholesterolemia   . Hyperlipidemia, mixed 02/01/2017  . Hypertension   . Osteoarthritis    hands, feet  . Sick sinus syndrome ([place]) 01/04/2017   Overview:  Sp pacer placement 2018  . Syncope 12/08/2016   Past Surgical History:  Procedure Laterality Date  . APPENDECTOMY  1957  . CATARACT EXTRACTION  2013   left eye  . INCISION AND DRAINAGE ABSCESS Right 01/29/2017   Procedure: INCISION AND DRAINAGE ABSCESS;  Surgeon: Florene Glen, MD;  Location: [facility] ORS;  Service: General;  Laterality: Right;  . KIDNEY SURGERY  age 52  . PACEMAKER INSERTION Left 12/14/2016   Procedure: INSERTION PACEMAKER;  Surgeon: Isaias Cowman, MD;  Location: [facility] ORS;  Service: Cardiovascular;   Laterality: Left;  . TUBAL LIGATION     Family History  Problem Relation Age of Onset  . Congestive Heart Failure Father   . Multiple myeloma Mother   . Hypertension Brother   . Alzheimer's disease Unknown        grandmother and great aunts  . Breast cancer Neg Hx   . Colon cancer Neg Hx    Social History   Social History  . Marital status: Widowed    Spouse name: N/A  . Number of children: 4  . Years of education: N/A   Social History Main Topics  . Smoking status: Never Smoker  . Smokeless tobacco: Never Used  . Alcohol use No     Comment: occasional  . Drug use: No  . Sexual activity: Not Asked   Other Topics Concern  . None   Social History Narrative  . None    Outpatient Encounter Prescriptions as of 03/23/2017  Medication Sig  . acetaminophen (TYLENOL) 325 MG tablet Take 2 tablets (650 mg total) by mouth every 6 (six) hours as needed for mild pain, moderate pain or fever (or Fever >/= 101).  Marland Kitchen amLODipine (NORVASC) 10 MG tablet Take 1 tablet (10 mg total) by mouth daily.  . Cholecalciferol (VITAMIN D-3) 1000 UNITS CAPS Take 1 capsule by mouth daily.   Marland Kitchen ELIQUIS 5 MG TABS  tablet Take 5 mg by mouth 2 (two) times daily.  Marland Kitchen FLUoxetine (PROZAC) 40 MG capsule Take 1 capsule (40 mg total) by mouth daily.  . hydrochlorothiazide (HYDRODIURIL) 25 MG tablet Take 0.5 tablets by mouth daily.  Marland Kitchen lisinopril (PRINIVIL,ZESTRIL) 40 MG tablet Take 1 tablet (40 mg total) by mouth daily.  Marland Kitchen lovastatin (MEVACOR) 20 MG tablet Take 1 tablet (20 mg total) by mouth daily.  . metoprolol succinate (TOPROL-XL) 50 MG 24 hr tablet TAKE (1) TABLET BY MOUTH DAILY. TAKE WITH OR IMMEDIATELY FOLLOWING A MEAL  . naproxen sodium (ANAPROX) 220 MG tablet Take 220 mg by mouth 2 (two) times daily with a meal.  . senna-docusate (SENOKOT-S) 8.6-50 MG tablet Take 1 tablet by mouth at bedtime as needed for mild constipation.  . traZODone (DESYREL) 100 MG tablet Take 1 tablet (100 mg total) by mouth at  bedtime.   No facility-administered encounter medications on file as of 03/23/2017.     Review of Systems  Constitutional: Negative for appetite change and fever.  HENT: Negative for congestion and sinus pressure.   Respiratory: Negative for cough, chest tightness and shortness of breath.   Cardiovascular: Negative for chest pain, palpitations and leg swelling.  Gastrointestinal: Negative for abdominal pain, diarrhea, nausea and vomiting.  Genitourinary: Negative for difficulty urinating and dysuria.  Musculoskeletal: Negative for joint swelling and myalgias.  Skin: Negative for color change and rash.  Neurological: Negative for dizziness, light-headedness and headaches.  Psychiatric/Behavioral: Negative for agitation and dysphoric mood.       Objective:    Physical Exam  Constitutional: She appears well-developed and well-nourished. No distress.  HENT:  Nose: Nose normal.  Mouth/Throat: Oropharynx is clear and moist.  Neck: Neck supple. No thyromegaly present.  Cardiovascular: Normal rate and regular rhythm.   Pulmonary/Chest: Breath sounds normal. No respiratory distress. She has no wheezes.  Abdominal: Soft. Bowel sounds are normal. There is no tenderness.  Musculoskeletal: She exhibits no edema or tenderness.  Lymphadenopathy:    She has no cervical adenopathy.  Skin: No rash noted. No erythema.  Psychiatric: She has a normal mood and affect. Her behavior is normal.    BP (!) 146/82 (BP Location: Left Arm, Patient Position: Sitting, Cuff Size: Large)   Pulse 60   Temp 98.6 F (37 C) (Oral)   Ht 5' 2"  (1.575 m)   Wt 165 lb 9.6 oz (75.1 kg)   SpO2 95%   BMI 30.29 kg/m  Wt Readings from Last 3 Encounters:  03/23/17 165 lb 9.6 oz (75.1 kg)  02/09/17 169 lb (76.7 kg)  02/05/17 165 lb 12.8 oz (75.2 kg)     Lab Results  Component Value Date   WBC 5.7 03/23/2017   HGB 13.9 03/23/2017   HCT 42.9 03/23/2017   PLT 207.0 03/23/2017   GLUCOSE 77 03/23/2017   CHOL 219  (H) 11/26/2016   TRIG 68.0 11/26/2016   HDL 91.90 11/26/2016   LDLDIRECT 105.8 09/06/2013   LDLCALC 113 (H) 11/26/2016   ALT 9 03/23/2017   AST 17 03/23/2017   NA 139 03/23/2017   K 4.2 03/23/2017   CL 104 03/23/2017   CREATININE 0.80 03/23/2017   BUN 21 03/23/2017   CO2 29 03/23/2017   TSH 1.69 11/26/2016   INR 1.12 01/27/2017    Dg Elbow 2 Views Right  Result Date: 01/28/2017 CLINICAL DATA:  Cellulitis of right arm. Right elbow pain and swelling. EXAM: RIGHT ELBOW - 2 VIEW COMPARISON:  None. FINDINGS: There is no  evidence of fracture, dislocation, or joint effusion. Mild osteoarthritis. There is diffuse soft tissue edema. Questionable soft tissue defect of the posterior elbow. No tracking soft tissue air. No radiopaque foreign body. IMPRESSION: Diffuse soft tissue edema of the elbow with probable skin defect posteriorly, skin breaks reported on available clinical note. No evidence of tracking soft tissue air. Electronically Signed   By: Jeb Levering M.D.   On: 01/28/2017 00:51       Assessment & Plan:   Problem List Items Addressed This Visit    Anxiety    Doing well on prozac.  Stable.       Atrial fibrillation ([place])    On eliquis.  Appears to be in SR.  Stable.  Continue f/u with cardiology.       Hypercholesterolemia    On lovastatin.  Low cholesterol diet and exercise.  Follow lipid panel and liver function tests.        Relevant Orders   Hepatic function panel (Completed)   Hypertension - Primary    Blood pressure under reasonable control.  Same medication regimen.  Follow pressures.  Follow metabolic panel.        Relevant Orders   Basic metabolic panel (Completed)   Sick sinus syndrome [facility])    S/p pacemaker placement.  Followed by cardiology.  Doing well.         Other Visit Diagnoses    Leukocytosis, unspecified type       Relevant Orders   CBC with Differential/Platelet (Completed)       Einar Pheasant, MD

## 2017-03-23 NOTE — Progress Notes (Signed)
Pre-visit discussion using our clinic review tool. No additional management support is needed unless otherwise documented below in the visit note.  

## 2017-03-23 NOTE — Progress Notes (Signed)
Order placed for follow up labs 

## 2017-03-24 ENCOUNTER — Encounter: Payer: Self-pay | Admitting: Internal Medicine

## 2017-03-24 NOTE — Assessment & Plan Note (Signed)
Doing well on prozac.  Stable.

## 2017-03-24 NOTE — Assessment & Plan Note (Signed)
On eliquis.  Appears to be in SR.  Stable.  Continue f/u with cardiology.

## 2017-03-24 NOTE — Assessment & Plan Note (Signed)
On lovastatin.  Low cholesterol diet and exercise.  Follow lipid panel and liver function tests.   

## 2017-03-24 NOTE — Assessment & Plan Note (Signed)
Blood pressure under reasonable control.  Same medication regimen.  Follow pressures.  Follow metabolic panel.   

## 2017-03-24 NOTE — Assessment & Plan Note (Signed)
S/p pacemaker placement.  Followed by cardiology.  Doing well.

## 2017-04-05 ENCOUNTER — Telehealth: Payer: Self-pay

## 2017-04-05 NOTE — Telephone Encounter (Signed)
Form received placed in red folder for your review.

## 2017-04-06 NOTE — Telephone Encounter (Signed)
Form placed back in the box.  I think this is the second from we have tried to clarify medications.  Will need to confirm with pt if she is taking the hctz and the metoprolol.

## 2017-04-07 NOTE — Telephone Encounter (Signed)
She is still taking HCTZ and metoprolol. Form is placed back in your red folder.

## 2017-04-07 NOTE — Telephone Encounter (Signed)
Just hold on charging for this form.  Thanks

## 2017-04-07 NOTE — Telephone Encounter (Signed)
Corrected form and placed in box and signed.

## 2017-04-07 NOTE — Telephone Encounter (Signed)
I have faxed form but am holding from sending to scan. Did you want to charge for this form?

## 2017-04-08 NOTE — Telephone Encounter (Signed)
Sent to scan no charge posted

## 2017-04-14 ENCOUNTER — Telehealth: Payer: Self-pay

## 2017-04-14 DIAGNOSIS — R4182 Altered mental status, unspecified: Secondary | ICD-10-CM
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
## 2017-04-15 NOTE — Telephone Encounter (Signed)
Here is the pt we discussed.
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
## 2017-04-16 NOTE — Telephone Encounter (Signed)
Called Randi l/m to call office back about app.

## 2017-04-16 NOTE — Telephone Encounter (Signed)
Pt notified of appt with Dr Melrose Nakayama.  States she will get family to take her.  Left message on voice mail for Lala Lund - see notes.  Please call later and confirm that Randi received message.  Thanks.

## 2017-04-16 NOTE — Telephone Encounter (Signed)
I put a sticky on your monitor. She is scheduled with Chipper Herb, PA (potter's) on Monday September 10 at 9:00 am. I have not called her or family and asked them not to call her either.

## 2017-04-16 NOTE — Telephone Encounter (Signed)
randi called back she spoke to power of attorney and they are not able to give patient ride. I have transferred call to you.

## 2017-04-18 ENCOUNTER — Emergency Department: Payer: Medicare Other

## 2017-04-18 ENCOUNTER — Encounter: Payer: Self-pay | Admitting: Emergency Medicine

## 2017-04-18 ENCOUNTER — Inpatient Hospital Stay
Admission: EM | Admit: 2017-04-18 | Discharge: 2017-04-28 | DRG: 163 | Disposition: A | Discharge status: Skilled Nursing Facility | Payer: Medicare Other | Attending: Internal Medicine | Admitting: Internal Medicine

## 2017-04-18 DIAGNOSIS — Z9842 Cataract extraction status, left eye: Secondary | ICD-10-CM

## 2017-04-18 DIAGNOSIS — N179 Acute kidney failure, unspecified: Secondary | ICD-10-CM | POA: Diagnosis present

## 2017-04-18 DIAGNOSIS — Z79899 Other long term (current) drug therapy: Secondary | ICD-10-CM

## 2017-04-18 DIAGNOSIS — J939 Pneumothorax, unspecified: Secondary | ICD-10-CM

## 2017-04-18 DIAGNOSIS — R7989 Other specified abnormal findings of blood chemistry: Secondary | ICD-10-CM

## 2017-04-18 DIAGNOSIS — J90 Pleural effusion, not elsewhere classified: Secondary | ICD-10-CM | POA: Diagnosis not present

## 2017-04-18 DIAGNOSIS — M25559 Pain in unspecified hip: Secondary | ICD-10-CM

## 2017-04-18 DIAGNOSIS — R079 Chest pain, unspecified: Secondary | ICD-10-CM

## 2017-04-18 DIAGNOSIS — J9382 Other air leak: Secondary | ICD-10-CM | POA: Diagnosis not present

## 2017-04-18 DIAGNOSIS — F329 Major depressive disorder, single episode, unspecified: Secondary | ICD-10-CM | POA: Diagnosis present

## 2017-04-18 DIAGNOSIS — Z7901 Long term (current) use of anticoagulants: Secondary | ICD-10-CM | POA: Diagnosis not present

## 2017-04-18 DIAGNOSIS — I509 Heart failure, unspecified: Secondary | ICD-10-CM | POA: Diagnosis present

## 2017-04-18 DIAGNOSIS — J942 Hemothorax: Principal | ICD-10-CM | POA: Diagnosis present

## 2017-04-18 DIAGNOSIS — D62 Acute posthemorrhagic anemia: Secondary | ICD-10-CM | POA: Diagnosis present

## 2017-04-18 DIAGNOSIS — I959 Hypotension, unspecified: Secondary | ICD-10-CM | POA: Diagnosis present

## 2017-04-18 DIAGNOSIS — Z9889 Other specified postprocedural states: Secondary | ICD-10-CM

## 2017-04-18 DIAGNOSIS — Z85828 Personal history of other malignant neoplasm of skin: Secondary | ICD-10-CM | POA: Diagnosis not present

## 2017-04-18 DIAGNOSIS — I482 Chronic atrial fibrillation: Secondary | ICD-10-CM | POA: Diagnosis present

## 2017-04-18 DIAGNOSIS — Z950 Presence of cardiac pacemaker: Secondary | ICD-10-CM | POA: Diagnosis not present

## 2017-04-18 DIAGNOSIS — Z09 Encounter for follow-up examination after completed treatment for conditions other than malignant neoplasm: Secondary | ICD-10-CM

## 2017-04-18 DIAGNOSIS — J9690 Respiratory failure, unspecified, unspecified whether with hypoxia or hypercapnia: Secondary | ICD-10-CM

## 2017-04-18 DIAGNOSIS — J9601 Acute respiratory failure with hypoxia: Secondary | ICD-10-CM | POA: Diagnosis present

## 2017-04-18 DIAGNOSIS — E7800 Pure hypercholesterolemia, unspecified: Secondary | ICD-10-CM | POA: Diagnosis present

## 2017-04-18 DIAGNOSIS — F419 Anxiety disorder, unspecified: Secondary | ICD-10-CM | POA: Diagnosis present

## 2017-04-18 DIAGNOSIS — I4891 Unspecified atrial fibrillation: Secondary | ICD-10-CM

## 2017-04-18 DIAGNOSIS — R778 Other specified abnormalities of plasma proteins: Secondary | ICD-10-CM

## 2017-04-18 DIAGNOSIS — M25511 Pain in right shoulder: Secondary | ICD-10-CM | POA: Diagnosis present

## 2017-04-18 DIAGNOSIS — M1990 Unspecified osteoarthritis, unspecified site: Secondary | ICD-10-CM | POA: Diagnosis present

## 2017-04-18 DIAGNOSIS — I110 Hypertensive heart disease with heart failure: Secondary | ICD-10-CM | POA: Diagnosis present

## 2017-04-18 DIAGNOSIS — E860 Dehydration: Secondary | ICD-10-CM | POA: Diagnosis present

## 2017-04-18 LAB — COMPREHENSIVE METABOLIC PANEL
ALBUMIN: 3.7 g/dL (ref 3.5–5.0)
ALK PHOS: 63 U/L (ref 38–126)
ALT: 25 U/L (ref 14–54)
AST: 36 U/L (ref 15–41)
Anion gap: 8 (ref 5–15)
BUN: 31 mg/dL — AB (ref 6–20)
CALCIUM: 9.1 mg/dL (ref 8.9–10.3)
CO2: 25 mmol/L (ref 22–32)
CREATININE: 1.19 mg/dL — AB (ref 0.44–1.00)
Chloride: 106 mmol/L (ref 101–111)
GFR calc Af Amer: 47 mL/min — ABNORMAL LOW (ref >60)
GFR calc non Af Amer: 40 mL/min — ABNORMAL LOW (ref >60)
Glucose, Bld: 179 mg/dL — ABNORMAL HIGH (ref 65–99)
Potassium: 4.1 mmol/L (ref 3.5–5.1)
Sodium: 139 mmol/L (ref 135–145)
TOTAL PROTEIN: 6.7 g/dL (ref 6.5–8.1)
Total Bilirubin: 0.7 mg/dL (ref 0.3–1.2)

## 2017-04-18 LAB — CBC WITH DIFFERENTIAL/PLATELET
BASOS ABS: 0.1 10*3/uL (ref 0–0.1)
BASOS PCT: 1 %
Eosinophils Absolute: 0 10*3/uL (ref 0–0.7)
Eosinophils Relative: 0 %
HEMATOCRIT: 34.6 % — AB (ref 35.0–47.0)
HEMOGLOBIN: 11.8 g/dL — AB (ref 12.0–16.0)
Lymphocytes Relative: 13 %
Lymphs Abs: 1.3 10*3/uL (ref 1.0–3.6)
MCH: 28.7 pg (ref 26.0–34.0)
MCHC: 34.1 g/dL (ref 32.0–36.0)
MCV: 84 fL (ref 80.0–100.0)
MONOS PCT: 8 %
Monocytes Absolute: 0.8 10*3/uL (ref 0.2–0.9)
NEUTROS ABS: 7.8 10*3/uL — AB (ref 1.4–6.5)
NEUTROS PCT: 78 %
Platelets: 201 10*3/uL (ref 150–440)
RBC: 4.11 MIL/uL (ref 3.80–5.20)
RDW: 16 % — ABNORMAL HIGH (ref 11.5–14.5)
WBC: 10.1 10*3/uL (ref 3.6–11.0)

## 2017-04-18 LAB — FIBRIN DERIVATIVES D-DIMER (ARMC ONLY): FIBRIN DERIVATIVES D-DIMER (ARMC): 612.83 — AB (ref 0.00–499.00)

## 2017-04-18 LAB — TROPONIN I
TROPONIN I: 0.09 ng/mL — AB (ref <0.03)
Troponin I: 0.04 ng/mL (ref <0.03)

## 2017-04-18 LAB — GLUCOSE, CAPILLARY
Glucose-Capillary: 112 mg/dL — ABNORMAL HIGH (ref 65–99)
Glucose-Capillary: 138 mg/dL — ABNORMAL HIGH (ref 65–99)

## 2017-04-18 LAB — MRSA PCR SCREENING: MRSA BY PCR: NEGATIVE

## 2017-04-18 MED ORDER — TRAZODONE HCL 100 MG PO TABS
100.0000 mg | ORAL_TABLET | Freq: Every day | ORAL | Status: DC
  Administered 2017-04-18: 100 mg via ORAL
  Filled 2017-04-18: qty 1

## 2017-04-18 MED ORDER — ORAL CARE MOUTH RINSE
15.0000 mL | Freq: Two times a day (BID) | OROMUCOSAL | Status: DC
  Administered 2017-04-18 – 2017-04-28 (×16): 15 mL via OROMUCOSAL

## 2017-04-18 MED ORDER — SENNOSIDES-DOCUSATE SODIUM 8.6-50 MG PO TABS: 1.0000 | ORAL_TABLET | Freq: Every evening | ORAL | Status: DC | PRN

## 2017-04-18 MED ORDER — ACETAMINOPHEN 650 MG RE SUPP: 650.0000 mg | Freq: Four times a day (QID) | RECTAL | Status: DC | PRN

## 2017-04-18 MED ORDER — ACETAMINOPHEN 325 MG PO TABS: 650.0000 mg | ORAL_TABLET | Freq: Four times a day (QID) | ORAL | Status: DC | PRN

## 2017-04-18 MED ORDER — ALBUTEROL SULFATE (2.5 MG/3ML) 0.083% IN NEBU: 2.5000 mg | INHALATION_SOLUTION | RESPIRATORY_TRACT | Status: DC | PRN

## 2017-04-18 MED ORDER — PNEUMOCOCCAL VAC POLYVALENT 25 MCG/0.5ML IJ INJ
0.5000 mL | INJECTION | INTRAMUSCULAR | Status: DC
  Filled 2017-04-18: qty 0.5

## 2017-04-18 MED ORDER — SODIUM CHLORIDE 0.9 % IV BOLUS (SEPSIS)
1000.0000 mL | INTRAVENOUS | Status: DC | PRN
  Administered 2017-04-18 (×2): 1000 mL via INTRAVENOUS

## 2017-04-18 MED ORDER — HALOPERIDOL 5 MG PO TABS
5.0000 mg | ORAL_TABLET | Freq: Once | ORAL | Status: AC | PRN
Start: 2017-04-18 — End: 2017-04-18
  Filled 2017-04-18: qty 1

## 2017-04-18 MED ORDER — DILTIAZEM HCL 25 MG/5ML IV SOLN
5.0000 mg | Freq: Once | INTRAVENOUS | Status: DC
  Filled 2017-04-18: qty 5

## 2017-04-18 MED ORDER — HALOPERIDOL LACTATE 5 MG/ML IJ SOLN
5.0000 mg | Freq: Once | INTRAMUSCULAR | Status: AC | PRN
Start: 2017-04-18 — End: 2017-04-18
  Administered 2017-04-18: 5 mg via INTRAVENOUS
  Filled 2017-04-18: qty 1

## 2017-04-18 MED ORDER — BISACODYL 5 MG PO TBEC
5.0000 mg | DELAYED_RELEASE_TABLET | Freq: Every day | ORAL | Status: DC | PRN
  Administered 2017-04-28: 5 mg via ORAL
  Filled 2017-04-18: qty 1

## 2017-04-18 MED ORDER — FLUOXETINE HCL 20 MG PO CAPS
40.0000 mg | ORAL_CAPSULE | Freq: Every day | ORAL | Status: DC
  Administered 2017-04-18 – 2017-04-19 (×2): 40 mg via ORAL
  Filled 2017-04-18 (×2): qty 2

## 2017-04-18 MED ORDER — ONDANSETRON HCL 4 MG/2ML IJ SOLN: 4.0000 mg | Freq: Four times a day (QID) | INTRAMUSCULAR | Status: DC | PRN

## 2017-04-18 MED ORDER — SODIUM CHLORIDE 0.9 % IV SOLN
INTRAVENOUS | Status: DC
  Administered 2017-04-18: 75 mL/h via INTRAVENOUS

## 2017-04-18 MED ORDER — ONDANSETRON HCL 4 MG PO TABS: 4.0000 mg | ORAL_TABLET | Freq: Four times a day (QID) | ORAL | Status: DC | PRN

## 2017-04-18 MED ORDER — KETOROLAC TROMETHAMINE 15 MG/ML IJ SOLN
15.0000 mg | Freq: Four times a day (QID) | INTRAMUSCULAR | Status: DC | PRN
Start: 2017-04-18 — End: 2017-04-19
  Administered 2017-04-18: 15 mg via INTRAVENOUS
  Filled 2017-04-18: qty 1

## 2017-04-18 MED ORDER — HYDROCODONE-ACETAMINOPHEN 5-325 MG PO TABS
1.0000 | ORAL_TABLET | ORAL | Status: DC | PRN
  Administered 2017-04-18: 2 via ORAL
  Administered 2017-04-18 – 2017-04-19 (×2): 1 via ORAL
  Administered 2017-04-21 – 2017-04-23 (×5): 2 via ORAL
  Administered 2017-04-24 (×2): 1 via ORAL
  Administered 2017-04-25 (×2): 2 via ORAL
  Administered 2017-04-25 – 2017-04-28 (×2): 1 via ORAL
  Filled 2017-04-18 (×2): qty 2
  Filled 2017-04-18: qty 1
  Filled 2017-04-18: qty 2
  Filled 2017-04-18 (×3): qty 1
  Filled 2017-04-18: qty 2
  Filled 2017-04-18: qty 1
  Filled 2017-04-18: qty 2
  Filled 2017-04-18: qty 1
  Filled 2017-04-18 (×3): qty 2

## 2017-04-18 NOTE — Consult Note (Signed)
   [facility]  CARDIOLOGY CONSULT NOTE  Patient ID: Kristin Avila MRN: [id number] DOB/AGE: 81/24/1929 81 y.o.  Admit date: 04/18/2017 Referring Physician Dr. Chen Primary Physician   Primary Cardiologist Dr. K[place]i Reason for Consultation syncope  HPI: Pt is a 83 yo female with history of sick sinus syndrome s/p ppm placement, history of afib, hypertension, hyperlipidemia who presented to th er with complaints of bilateral shoulder pain with movment, chills and was noted to have a large right sided pleural effusion, new since may.  She also had afib with rvr. Pt stats that she had syncope. She was moderaately hypotensive. Pacemaker is funcitoning normally.   Review of Systems  Constitutional: Positive for chills and fever.  HENT: Negative.   Eyes: Negative.   Respiratory: Positive for shortness of breath.   Cardiovascular: Positive for chest pain.  Musculoskeletal: Positive for back pain.  Skin: Negative.   Neurological: Positive for weakness.  Endo/Heme/Allergies: Negative.   Psychiatric/Behavioral: Negative.     Past Medical History:  Diagnosis Date  . Anemia   . Anxiety 10/02/2012  . Atrial fibrillation (HCC) 11/13   new onset 11/13  . BCC (basal cell carcinoma), face 04/24/2016  . Benign essential hypertension 03/13/2015  . Cellulitis of right upper extremity 01/27/2017  . Constipation 01/13/2015  . Decreased hearing 05/21/2015  . Health care maintenance 10/14/2014   Mammogram 10/01/15 - Birads I.    . Hypercholesterolemia   . Hyperlipidemia, mixed 02/01/2017  . Hypertension   . Osteoarthritis    hands, feet  . Sick sinus syndrome (HCC) 01/04/2017   Overview:  Sp pacer placement 2018  . Syncope 12/08/2016    Family History  Problem Relation Age of Onset  . Congestive Heart Failure Father   . Multiple myeloma Mother   . Hypertension Brother   . Alzheimer's disease Unknown        grandmother and great aunts  . Breast  cancer Neg Hx   . Colon cancer Neg Hx     Social History   Social History  . Marital status: Widowed    Spouse name: N/A  . Number of children: 4  . Years of education: N/A   Occupational History  . Not on file.   Social History Main Topics  . Smoking status: Never Smoker  . Smokeless tobacco: Never Used  . Alcohol use No     Comment: occasional  . Drug use: No  . Sexual activity: Not on file   Other Topics Concern  . Not on file   Social History Narrative  . No narrative on file    Past Surgical History:  Procedure Laterality Date  . APPENDECTOMY  1957  . CATARACT EXTRACTION  2013   left eye  . INCISION AND DRAINAGE ABSCESS Right 01/29/2017   Procedure: INCISION AND DRAINAGE ABSCESS;  Surgeon: Cooper, Richard E, MD;  Location: [facility] ORS;  Service: General;  Laterality: Right;  . KIDNEY SURGERY  age 25  . PACEMAKER INSERTION Left 12/14/2016   Procedure: INSERTION PACEMAKER;  Surgeon: Paraschos, Alexander, MD;  Location: [facility] ORS;  Service: Cardiovascular;  Laterality: Left;  . TUBAL LIGATION       Prescriptions Prior to Admission  Medication Sig Dispense Refill Last Dose  . acetaminophen (TYLENOL) 325 MG tablet Take 2 tablets (650 mg total) by mouth every 6 (six) hours as needed for mild pain, moderate pain or fever (or Fever >/= 101).   unknown  . amLODipine (NORVASC)   10 MG tablet Take 1 tablet (10 mg total) by mouth daily. 90 tablet 1 unknown  . Cholecalciferol (VITAMIN D-3) 1000 UNITS CAPS Take 1 capsule by mouth daily.    unknown  . ELIQUIS 5 MG TABS tablet Take 5 mg by mouth 2 (two) times daily.   unknown  . FLUoxetine (PROZAC) 40 MG capsule Take 1 capsule (40 mg total) by mouth daily. 90 capsule 1 unknown  . hydrochlorothiazide (HYDRODIURIL) 25 MG tablet Take 0.5 tablets by mouth daily.   unknown  . lisinopril (PRINIVIL,ZESTRIL) 40 MG tablet Take 1 tablet (40 mg total) by mouth daily. 90 tablet 1 unknown  . lovastatin (MEVACOR) 20 MG tablet Take 1 tablet (20 mg  total) by mouth daily. 90 tablet 1 unknown  . metoprolol succinate (TOPROL-XL) 50 MG 24 hr tablet TAKE (1) TABLET BY MOUTH DAILY. TAKE WITH OR IMMEDIATELY FOLLOWING A MEAL 30 tablet 10 unknown  . naproxen sodium (ANAPROX) 220 MG tablet Take 220 mg by mouth 2 (two) times daily with a meal.   unknown  . senna-docusate (SENOKOT-S) 8.6-50 MG tablet Take 1 tablet by mouth at bedtime as needed for mild constipation.   unknown  . traZODone (DESYREL) 100 MG tablet Take 1 tablet (100 mg total) by mouth at bedtime. 90 tablet 1 unknown    Physical Exam: Blood pressure (!) 87/44, pulse 62, temperature (!) 97.2 F (36.2 C), temperature source Oral, resp. rate (!) 24, height 5' 2" (1.575 m), weight 74.8 kg (164 lb 14.5 oz), SpO2 100 %.   Wt Readings from Last 1 Encounters:  04/18/17 74.8 kg (164 lb 14.5 oz)     General appearance: cooperative Resp: diminished breath sounds RLL Cardio: regular rate and rhythm GI: soft, non-tender; bowel sounds normal; no masses,  no organomegaly Extremities: extremities normal, atraumatic, no cyanosis or edema Neurologic: Grossly normal  Labs:   Lab Results  Component Value Date   WBC 10.1 04/18/2017   HGB 11.8 (L) 04/18/2017   HCT 34.6 (L) 04/18/2017   MCV 84.0 04/18/2017   PLT 201 04/18/2017    Recent Labs Lab 04/18/17 0526  NA 139  K 4.1  CL 106  CO2 25  BUN 31*  CREATININE 1.19*  CALCIUM 9.1  PROT 6.7  BILITOT 0.7  ALKPHOS 63  ALT 25  AST 36  GLUCOSE 179*   Lab Results  Component Value Date   CKTOTAL 83 08/21/2012   CKMB 0.8 08/21/2012   TROPONINI 0.09 (HH) 04/18/2017      Radiology: right pleural effusion EKG: a v paced  ASSESSMENT AND PLAN:  Patient with hitory of afib and syncope and sss s/p ppm who was admitted after presenting to the er with sob, and bilateral shoulder and chest pain with chills. Noted to have right pleural effusion on cxr. New from may. Trivial troponin elevation. Appears to be demand ischemia and not cad. She is  on eliquis for anticoagulaton. Was relatively hypotensive in the er and had afib with rvr . She received a bolu of cardizem if andnow in icu is av paced. Agree with holdin elequis for thoracentesis. Pacer is funcitoning normally. Will follow with you Signed:  A  MD, FACC 04/18/2017, 5:42 PM     

## 2017-04-18 NOTE — ED Notes (Signed)
Contacted Dr Bridgett Larsson concerning vital signs of patient, Dr.Chen reviewing and will make a change to admit order.

## 2017-04-18 NOTE — Consult Note (Signed)
Name: Kristin Avila MRN: [id number] DOB: Jun 05, 1928     CONSULTATION DATE: 04/18/2017  REFERRING MD : Bridgett Larsson  CHIEF COMPLAINT:  syncope  STUDIES:  I have Independently reviewed images of  CXR   on 04/18/2017 Interpretation:RT lower lobe opacity-effusion/atalectasis    HISTORY OF PRESENT ILLNESS:   83 y.o. female with a known history of A. Fib, 6 sinus syndrome, status post pacemaker placement, hypertension, hyperlipidemia and osteoarthritis.  -The patient presented to the ED with bilateral shoulder pain, right greater than left this morning.  -She was fine until this morning. She denies any fever or chills, no cough, wheezing or shortness of breath.  -She also complains of generalized body aching. patient had chills over in the morning.  Chest x-ray show large right-sided opacity which is new compared to chest x-ray in May. She was found A. Fib with RVR, given small bolus Cardizem in the ED. Heart rate is controlled, but blood pressure decreased to 80s. She had fallen and could not get up  Admitted to step down     PAST MEDICAL HISTORY :   has a past medical history of Anemia; Anxiety (10/02/2012); Atrial fibrillation ([place]) (11/13); BCC (basal cell carcinoma), face (04/24/2016); Benign essential hypertension (03/13/2015); Cellulitis of right upper extremity (01/27/2017); Constipation (01/13/2015); Decreased hearing (05/21/2015); Health care maintenance (10/14/2014); Hypercholesterolemia; Hyperlipidemia, mixed (02/01/2017); Hypertension; Osteoarthritis; Sick sinus syndrome ([place]) (01/04/2017); and Syncope (12/08/2016).  has a past surgical history that includes Tubal ligation; Appendectomy (1957); Kidney surgery (age 34); Cataract extraction (2013); Pacemaker insertion (Left, 12/14/2016); and Incision and drainage abscess (Right, 01/29/2017). Prior to Admission medications   Medication Sig Start Date End Date Taking? Authorizing Provider  acetaminophen (TYLENOL) 325 MG tablet Take 2 tablets (650 mg  total) by mouth every 6 (six) hours as needed for mild pain, moderate pain or fever (or Fever >/= 101). 12/11/16  Yes Gouru, Illene Silver, MD  amLODipine (NORVASC) 10 MG tablet Take 1 tablet (10 mg total) by mouth daily. 01/21/17  Yes Einar Pheasant, MD  Cholecalciferol (VITAMIN D-3) 1000 UNITS CAPS Take 1 capsule by mouth daily.    Yes [provider]  ELIQUIS 5 MG TABS tablet Take 5 mg by mouth 2 (two) times daily.   Yes [provider]  FLUoxetine (PROZAC) 40 MG capsule Take 1 capsule (40 mg total) by mouth daily. 01/18/17  Yes Einar Pheasant, MD  hydrochlorothiazide (HYDRODIURIL) 25 MG tablet Take 0.5 tablets by mouth daily. 02/03/17  Yes [provider]  lisinopril (PRINIVIL,ZESTRIL) 40 MG tablet Take 1 tablet (40 mg total) by mouth daily. 01/18/17  Yes Einar Pheasant, MD  lovastatin (MEVACOR) 20 MG tablet Take 1 tablet (20 mg total) by mouth daily. 01/18/17  Yes Einar Pheasant, MD  metoprolol succinate (TOPROL-XL) 50 MG 24 hr tablet TAKE (1) TABLET BY MOUTH DAILY. TAKE WITH OR IMMEDIATELY FOLLOWING A MEAL 01/19/17  Yes Einar Pheasant, MD  naproxen sodium (ANAPROX) 220 MG tablet Take 220 mg by mouth 2 (two) times daily with a meal.   Yes [provider]  senna-docusate (SENOKOT-S) 8.6-50 MG tablet Take 1 tablet by mouth at bedtime as needed for mild constipation. 12/11/16  Yes Gouru, Illene Silver, MD  traZODone (DESYREL) 100 MG tablet Take 1 tablet (100 mg total) by mouth at bedtime. 01/18/17  Yes Einar Pheasant, MD   No Known Allergies  FAMILY HISTORY:  family history includes Alzheimer's disease in her unknown relative; Congestive Heart Failure in her father; Hypertension in her brother; Multiple myeloma in her mother.  SOCIAL HISTORY:  reports that she has never smoked. She has never used smokeless tobacco. She reports that she does not drink alcohol or use drugs.  REVIEW OF SYSTEMS:   Constitutional: Negative for fever, +chills, weight loss, +malaise/fatigue and  diaphoresis.  HENT: Negative for hearing loss, ear pain, nosebleeds, congestion, sore throat, neck pain, tinnitus and ear discharge.   Eyes: Negative for blurred vision, double vision, photophobia, pain, discharge and redness.  Respiratory: Negative for cough, hemoptysis, sputum production, shortness of breath, wheezing and stridor.   Cardiovascular: Negative for chest pain, palpitations, orthopnea, claudication, leg swelling and PND.  Gastrointestinal: Negative for heartburn, nausea, vomiting, abdominal pain, diarrhea, constipation, blood in stool and melena.  Genitourinary: Negative for dysuria, urgency, frequency, hematuria and flank pain.  Musculoskeletal: Negative for myalgias, back pain, joint pain and falls.  Skin: Negative for itching and rash.  Neurological: Negative for dizziness, tingling, tremors, sensory change, speech change, focal weakness, seizures, loss of consciousness, weakness and headaches.  Endo/Heme/Allergies: Negative for environmental allergies and polydipsia. Does not bruise/bleed easily.  ALL OTHER ROS ARE NEGATIVE    VITAL SIGNS: Temp:  [96.5 F (35.8 C)-97.8 F (36.6 C)] 96.5 F (35.8 C) (09/09 1049) Pulse Rate:  [58-127] 58 (09/09 1100) Resp:  [12-29] 20 (09/09 1100) BP: (80-145)/(37-77) 82/51 (09/09 1100) SpO2:  [91 %-100 %] 100 % (09/09 1100) Weight:  [164 lb 14.5 oz (74.8 kg)-170 lb (77.1 kg)] 164 lb 14.5 oz (74.8 kg) (09/09 1049)  Physical Examination:   GENERAL:NAD, no fevers, chills, no weakness no fatigue HEAD: Normocephalic, atraumatic.  EYES: Pupils equal, round, reactive to light. Extraocular muscles intact. No scleral icterus.  MOUTH: Moist mucosal membrane.   EAR, NOSE, THROAT: Clear without exudates. No external lesions.  NECK: Supple. No thyromegaly. No nodules. No JVD.  PULMONARY:CTA B/L no wheezes, no crackles, no rhonchi CARDIOVASCULAR: S1 and S2. Regular rate and rhythm. No murmurs, rubs, or gallops. No edema.  GASTROINTESTINAL:  Soft, nontender, nondistended. No masses. Positive bowel sounds.  MUSCULOSKELETAL: No swelling, clubbing, or edema. Range of motion full in all extremities.  NEUROLOGIC: Cranial nerves II through XII are intact. No gross focal neurological deficits.  SKIN: No ulceration, lesions, rashes, or cyanosis. Skin warm and dry. Turgor intact.  PSYCHIATRIC: Mood, affect within normal limits. The patient is awake, alert and oriented x 3. Insight, judgment intact.       Recent Labs Lab 04/18/17 0526  NA 139  K 4.1  CL 106  CO2 25  BUN 31*  CREATININE 1.19*  GLUCOSE 179*    ASSESSMENT / PLAN: 83 yo pleasant white female with acute afib with rvr with RT sided opacity probably from pleural effusion with atelectasis with low BP.  1.VS per protocol 2.US guided thoracentesis pending 3.oxygen as needed 4.avoid cardizem, will use amiodarone if needed for afib with RVR 5.advance diet   Patien satisfied with Plan of action and management. All questions answered  Corrin Parker, M.D.  [facility]  Medical Director [place] Director [facility] Cardio-Pulmonary Department

## 2017-04-18 NOTE — Progress Notes (Signed)
Pt transferred to unit from ED. She is A&O x 2 and easily reoriented to time. She denies pain, SOB, CP, dyspnea, dizziness, and lightheadedness at this time. She reports that she came to the hospital because she "fell out" while trying to use the bathroom the morning around 0300. She denies having fallen, but she states she was able to lower herself to the ground and sit. VSS although BP is a little soft and temperature low: BP (!) 82/51   Pulse (!) 58   Temp (!) 96.5 F (35.8 C) (Axillary) Comment: warm blankets applied  Resp 20   Ht 5\' 2"  (1.575 m)   Wt 74.8 kg (164 lb 14.5 oz)   SpO2 100%   BMI 30.16 kg/m . Cardiology and pulmonology have been consulted on pt and they are aware of her admission. Pt has no further requests at this time.

## 2017-04-18 NOTE — ED Notes (Signed)
ED Provider at bedside. 

## 2017-04-18 NOTE — ED Notes (Signed)
Paced rhythm noted on the monitor.

## 2017-04-18 NOTE — ED Triage Notes (Addendum)
Patient presents to Emergency Department via [facility] with complaints of "pain all over" but especially in both shoulders as a dull ache. Pt had pacemaker installed this year.  Pt unable to state year or name this  hospital, has difficulty relating history of pain and medical hx.

## 2017-04-18 NOTE — NC FL2 (Signed)
[place] LEVEL OF CARE SCREENING TOOL     IDENTIFICATION  Patient Name: Kristin Avila Birthdate: [birth year] Sex: female Admission Date (Current Location): 04/18/2017  [place] and [place] Number:  Engineering geologist and Address:  [facility], [address], [place], [place] [address]      Provider Number: [id number]  Attending Physician Name and Address:  Demetrios Loll, MD  Relative Name and Phone Number:       Current Level of Care: Hospital Recommended Level of Care: [place] Prior Approval Number:    Date Approved/Denied:   PASRR Number: [id number]  Discharge Plan: SNF    Current Diagnoses: Patient Active Problem List   Diagnosis Date Noted  . Pleural effusion on right 04/18/2017  . Hypotension 04/18/2017  . Hyperlipidemia, mixed 02/01/2017  . Cellulitis of right upper extremity 01/27/2017  . Sick sinus syndrome ([place]) 01/04/2017  . Syncope 12/08/2016  . BCC (basal cell carcinoma), face 04/24/2016  . Decreased hearing 05/21/2015  . Benign essential hypertension 03/13/2015  . Constipation 01/13/2015  . Health care maintenance 10/14/2014  . Anxiety 10/02/2012  . Atrial fibrillation ([place]) 07/09/2012  . Osteoarthritis 07/08/2012  . Hypercholesterolemia 07/08/2012  . Hypertension 07/08/2012    Orientation RESPIRATION BLADDER Height & Weight     Self, Time, Situation, Place  Normal Continent Weight: 170 lb (77.1 kg) Height:  5\' 2"  (157.5 cm)  BEHAVIORAL SYMPTOMS/MOOD NEUROLOGICAL BOWEL NUTRITION STATUS      Continent Diet (Normal)  AMBULATORY STATUS COMMUNICATION OF NEEDS Skin   Independent Verbally Normal                       Personal Care Assistance Level of Assistance  Bathing, Feeding, Dressing, Total care Bathing Assistance: Limited assistance Feeding assistance: Independent Dressing Assistance: Limited assistance Total Care Assistance: Independent   Functional Limitations Info   Sight, Hearing, Speech Sight Info: Adequate Hearing Info: Impaired (Hard of hearing and wont wear hearing aid) Speech Info: Adequate    SPECIAL CARE FACTORS FREQUENCY  PT (By licensed PT), OT (By licensed OT)     PT Frequency: x5 OT Frequency: x5            Contractures Contractures Info: Not present    Additional Factors Info  Code Status Code Status Info: Full code             Current Medications (04/18/2017):  This is the current hospital active medication list Current Facility-Administered Medications  Medication Dose Route Frequency Provider Last Rate Last Dose  . diltiazem (CARDIZEM) injection 5 mg  5 mg Intravenous Once Paulette Blanch, MD   Stopped at 04/18/17 0703   Current Outpatient Prescriptions  Medication Sig Dispense Refill  . acetaminophen (TYLENOL) 325 MG tablet Take 2 tablets (650 mg total) by mouth every 6 (six) hours as needed for mild pain, moderate pain or fever (or Fever >/= 101).    Marland Kitchen amLODipine (NORVASC) 10 MG tablet Take 1 tablet (10 mg total) by mouth daily. 90 tablet 1  . Cholecalciferol (VITAMIN D-3) 1000 UNITS CAPS Take 1 capsule by mouth daily.     Marland Kitchen ELIQUIS 5 MG TABS tablet Take 5 mg by mouth 2 (two) times daily.    Marland Kitchen FLUoxetine (PROZAC) 40 MG capsule Take 1 capsule (40 mg total) by mouth daily. 90 capsule 1  . hydrochlorothiazide (HYDRODIURIL) 25 MG tablet Take 0.5 tablets by mouth daily.    Marland Kitchen lisinopril (PRINIVIL,ZESTRIL) 40 MG tablet  Take 1 tablet (40 mg total) by mouth daily. 90 tablet 1  . lovastatin (MEVACOR) 20 MG tablet Take 1 tablet (20 mg total) by mouth daily. 90 tablet 1  . metoprolol succinate (TOPROL-XL) 50 MG 24 hr tablet TAKE (1) TABLET BY MOUTH DAILY. TAKE WITH OR IMMEDIATELY FOLLOWING A MEAL 30 tablet 10  . naproxen sodium (ANAPROX) 220 MG tablet Take 220 mg by mouth 2 (two) times daily with a meal.    . senna-docusate (SENOKOT-S) 8.6-50 MG tablet Take 1 tablet by mouth at bedtime as needed for mild constipation.    .  traZODone (DESYREL) 100 MG tablet Take 1 tablet (100 mg total) by mouth at bedtime. 90 tablet 1     Discharge Medications: Please see discharge summary for a list of discharge medications.  Relevant Imaging Results:  Relevant Lab Results:   Additional Information [id number] SSN  [place], [place], [place]

## 2017-04-18 NOTE — Progress Notes (Signed)
Pt's BP has sustained in the 80s/50s. She continues to deny SOB, dyspnea, CP, dizziness, lightheadedness, or feeling faint. Dr. Mortimer Fries notified and he stated to administer 1L of NS and then to reasses.

## 2017-04-18 NOTE — ED Notes (Addendum)
Pt's daughter and son-in-law at bedside

## 2017-04-18 NOTE — Progress Notes (Signed)
Pt's troponin came back elevated at 0.09 and her fibrin derivatives came back elevated at 612.83. Dr. Mortimer Fries notified, no new orders received.

## 2017-04-18 NOTE — ED Notes (Signed)
CRITICAL LAB: TROPONIN is 0.04, [employer], Dr. Beather Arbour notified, orders received

## 2017-04-18 NOTE — H&P (Addendum)
[facility] NAME: Kristin Avila    MR#:  [id number]  DATE OF BIRTH:  02-13-1928  DATE OF ADMISSION:  04/18/2017  PRIMARY CARE PHYSICIAN: Einar Pheasant, MD   REQUESTING/REFERRING PHYSICIAN: Paulette Blanch, MD  CHIEF COMPLAINT:   Chief Complaint  Patient presents with  . Shoulder Pain   Right shoulder pain today HISTORY OF PRESENT ILLNESS:  Kristin Avila  is a 83 y.o. female with a known history of A. Fib, sick sinus syndrome, status post pacemaker placement, hypertension, hyperlipidemia and osteoarthritis. The patient presented to the ED with bilateral shoulder pain, right greater than left this morning. She was fine until this morning. Shoulder pain is extubated by movement. She denies any fever or chills, no cough, wheezing or shortness of breath. She also complains of generalized body aching. Her daughter states the patient had chills over in the morning. Chest x-ray show large right-sided pleural effusion, which is new compared to chest x-ray in May. She was found A. Fib with RVR, given small bolus Cardizem in the ED. Heart rate is controlled, but blood pressure decreased to 80s.  PAST MEDICAL HISTORY:   Past Medical History:  Diagnosis Date  . Anemia   . Anxiety 10/02/2012  . Atrial fibrillation ([place]) 11/13   new onset 11/13  . BCC (basal cell carcinoma), face 04/24/2016  . Benign essential hypertension 03/13/2015  . Cellulitis of right upper extremity 01/27/2017  . Constipation 01/13/2015  . Decreased hearing 05/21/2015  . Health care maintenance 10/14/2014   Mammogram 10/01/15 - Birads I.    . Hypercholesterolemia   . Hyperlipidemia, mixed 02/01/2017  . Hypertension   . Osteoarthritis    hands, feet  . Sick sinus syndrome ([place]) 01/04/2017   Overview:  Sp pacer placement 2018  . Syncope 12/08/2016    PAST SURGICAL HISTORY:   Past Surgical History:  Procedure Laterality Date  . APPENDECTOMY  1957  . CATARACT EXTRACTION   2013   left eye  . INCISION AND DRAINAGE ABSCESS Right 01/29/2017   Procedure: INCISION AND DRAINAGE ABSCESS;  Surgeon: Florene Glen, MD;  Location: [facility] ORS;  Service: General;  Laterality: Right;  . KIDNEY SURGERY  age 5  . PACEMAKER INSERTION Left 12/14/2016   Procedure: INSERTION PACEMAKER;  Surgeon: Isaias Cowman, MD;  Location: [facility] ORS;  Service: Cardiovascular;  Laterality: Left;  . TUBAL LIGATION      SOCIAL HISTORY:   Social History  Substance Use Topics  . Smoking status: Never Smoker  . Smokeless tobacco: Never Used  . Alcohol use No     Comment: occasional    FAMILY HISTORY:   Family History  Problem Relation Age of Onset  . Congestive Heart Failure Father   . Multiple myeloma Mother   . Hypertension Brother   . Alzheimer's disease Unknown        grandmother and great aunts  . Breast cancer Neg Hx   . Colon cancer Neg Hx     DRUG ALLERGIES:  No Known Allergies  REVIEW OF SYSTEMS:   Review of Systems  Constitutional: Positive for malaise/fatigue. Negative for chills and fever.  HENT: Negative for sore throat.   Eyes: Negative for blurred vision and double vision.  Respiratory: Negative for cough, hemoptysis, shortness of breath, wheezing and stridor.   Cardiovascular: Negative for chest pain, palpitations, orthopnea and leg swelling.  Gastrointestinal: Negative for abdominal pain, blood in stool, diarrhea, melena, nausea and vomiting.  Genitourinary:  Negative for dysuria, flank pain and hematuria.  Musculoskeletal: Positive for back pain. Negative for joint pain.       Shoulder pain  Neurological: Positive for weakness. Negative for dizziness, sensory change, focal weakness, seizures, loss of consciousness and headaches.  Endo/Heme/Allergies: Negative for polydipsia.  Psychiatric/Behavioral: Negative for depression. The patient is not nervous/anxious.     MEDICATIONS AT HOME:   Prior to Admission medications   Medication Sig Start Date End  Date Taking? Authorizing Provider  acetaminophen (TYLENOL) 325 MG tablet Take 2 tablets (650 mg total) by mouth every 6 (six) hours as needed for mild pain, moderate pain or fever (or Fever >/= 101). 12/11/16  Yes Gouru, Illene Silver, MD  amLODipine (NORVASC) 10 MG tablet Take 1 tablet (10 mg total) by mouth daily. 01/21/17  Yes Einar Pheasant, MD  Cholecalciferol (VITAMIN D-3) 1000 UNITS CAPS Take 1 capsule by mouth daily.    Yes [provider]  ELIQUIS 5 MG TABS tablet Take 5 mg by mouth 2 (two) times daily.   Yes [provider]  FLUoxetine (PROZAC) 40 MG capsule Take 1 capsule (40 mg total) by mouth daily. 01/18/17  Yes Einar Pheasant, MD  hydrochlorothiazide (HYDRODIURIL) 25 MG tablet Take 0.5 tablets by mouth daily. 02/03/17  Yes [provider]  lisinopril (PRINIVIL,ZESTRIL) 40 MG tablet Take 1 tablet (40 mg total) by mouth daily. 01/18/17  Yes Einar Pheasant, MD  lovastatin (MEVACOR) 20 MG tablet Take 1 tablet (20 mg total) by mouth daily. 01/18/17  Yes Einar Pheasant, MD  metoprolol succinate (TOPROL-XL) 50 MG 24 hr tablet TAKE (1) TABLET BY MOUTH DAILY. TAKE WITH OR IMMEDIATELY FOLLOWING A MEAL 01/19/17  Yes Einar Pheasant, MD  naproxen sodium (ANAPROX) 220 MG tablet Take 220 mg by mouth 2 (two) times daily with a meal.   Yes [provider]  senna-docusate (SENOKOT-S) 8.6-50 MG tablet Take 1 tablet by mouth at bedtime as needed for mild constipation. 12/11/16  Yes Gouru, Illene Silver, MD  traZODone (DESYREL) 100 MG tablet Take 1 tablet (100 mg total) by mouth at bedtime. 01/18/17  Yes Einar Pheasant, MD      VITAL SIGNS:  Blood pressure (!) 88/37, pulse (!) 59, temperature 97.8 F (36.6 C), temperature source Oral, resp. rate 13, height _0  (1.575 m), weight 170 lb (77.1 kg), SpO2 97 %.  PHYSICAL EXAMINATION:  Physical Exam  GENERAL:  83 y.o.-year-old patient lying in the bed with no acute distress.  EYES: Pupils equal, round, reactive to light and  accommodation. No scleral icterus. Extraocular muscles intact.  HEENT: Head atraumatic, normocephalic. Oropharynx and nasopharynx clear.  NECK:  Supple, no jugular venous distention. No thyroid enlargement, no tenderness.  LUNGS: no breath sounds on lower part of right side of chest, no wheezing, rales,rhonchi or crepitation. No use of accessory muscles of respiration.  CARDIOVASCULAR: S1, S2 normal. No murmurs, rubs, or gallops.  ABDOMEN: Soft, nontender, nondistended. Bowel sounds present. No organomegaly or mass.  EXTREMITIES: No pedal edema, cyanosis, or clubbing.  NEUROLOGIC: Cranial nerves II through XII are intact. Muscle strength 5/5 in all extremities. Sensation intact. Gait not checked.  PSYCHIATRIC: The patient is alert and oriented x 3.  SKIN: No obvious rash, lesion, or ulcer.   LABORATORY PANEL:   CBC  Recent Labs Lab 04/18/17 0526  WBC 10.1  HGB 11.8*  HCT 34.6*  PLT 201   ------------------------------------------------------------------------------------------------------------------  Chemistries   Recent Labs Lab 04/18/17 0526  NA 139  K 4.1  CL 106  CO2 25  GLUCOSE 179*  BUN 31*  CREATININE 1.19*  CALCIUM 9.1  AST 36  ALT 25  ALKPHOS 63  BILITOT 0.7   ------------------------------------------------------------------------------------------------------------------  Cardiac Enzymes  Recent Labs Lab 04/18/17 0526  TROPONINI 0.04*   ------------------------------------------------------------------------------------------------------------------  RADIOLOGY:  Dg Chest Port 1 View  Result Date: 04/18/2017 CLINICAL DATA:  Diffuse body pain. Pacemaker installed last week. Altered mental status. EXAM: PORTABLE CHEST 1 VIEW COMPARISON:  Chest radiograph Dec 14, 2016 FINDINGS: New large RIGHT pleural effusion with sub pulmonic component. RIGHT lung base consolidation. RIGHT midlung zone atelectasis. LEFT lung is clear. Cardiac silhouette predominately  obscured on the RIGHT. Calcified aortic knob. No pneumothorax. Dual lead LEFT cardiac pacemaker in situ. Severe LEFT glenohumeral osteoarthrosis. IMPRESSION: New large RIGHT pleural effusion with underlying consolidation, associated compressive atelectasis. Recommend follow-up chest radiograph after treatment to verify improvement. Electronically Signed   By: Elon Alas M.D.   On: 04/18/2017 05:46      IMPRESSION AND PLAN:   Right large pleural effusion with back pain and chest pain The patient will be admitted to stepdown unit. US guided thoracentesis per Dr. Mortimer Fries. Hold Eliquis for thoracentesis.  Pain control.  Hypotension. Blood pressure is low still at 80s. NS bolus.  Hold all hypertension medication for now.   Dehydration. Start IV fluid support and follow-up BMP. Hold HCTZ.  Elevated troponin. Possible due to demanding ischemia. Follow-up troponin level, hold Eliquis for now.   A. Fib RVR and sick sinus syndrome, status post pacemaker.  Hold Eliquis, no heparin drip per Dr. Mortimer Fries. Cardiology consult. Hold Lopressor due to hypotension.  Osteoarthritis. Pain control.  I discussed with Dr. Mortimer Fries. All the records are reviewed and case discussed with ED provider. Management plans discussed with the patient, her daughter and son and they are in agreement.  CODE STATUS: Full code  TOTAL CRITICAL TIME TAKING CARE OF THIS PATIENT: 62 minutes.    Demetrios Loll M.D on 04/18/2017 at 8:33 AM  Between 7am to 6pm - Pager - [phone]  After 6pm go to [email or web address] - Proofreader  [facility]  Office  [phone]  CC: Primary care physician; Einar Pheasant, MD   Note: This dictation was prepared with Dragon dictation along with smaller phrase technology. Any transcriptional errors that result from this process are unin

## 2017-04-18 NOTE — ED Notes (Addendum)
  Pt BP at 65 MAP, Dr Beather Arbour notified and pt converted from afib to paced rhythm, Hold cardizem, no fluids, monitor pressure, info given to son and daughter in room, pt mentation at baseline

## 2017-04-18 NOTE — Clinical Social Work Note (Signed)
Clinical Social Work Assessment  Patient Details  Name: Kristin Avila MRN: [id number] Date of Birth: 1927/12/04  Date of referral:  04/18/17               Reason for consult:  Facility Placement                Permission sought to share information with:  Family Supports, Customer service manager Permission granted to share information::  Yes, Verbal Permission Granted  Name::     [name] daughter [place] [phone] leave a voice mail Lanai Conlee son [phone]  Agency::  all facillities  Relationship::     Contact Information:     Housing/Transportation Living arrangements for the past 2 months:  Single Family Home Source of Information:  Patient, Power of [place], Adult Children Patient Interpreter Needed:  None Criminal Activity/Legal Involvement Pertinent to Current Situation/Hospitalization:  No - Comment as needed Significant Relationships:  Adult Children, Church, [place], Other Family Members Lives with:  Adult Children, Minor Children, Self Do you feel safe going back to the place where you live?  Yes Need for family participation in patient care:  Yes (Comment)  Care giving concerns: Patient is usually very active and independent and is now feeling weak and numb   Facilities manager / plan: LCSW introduced myself to family and patient and obtained verbal consent to conduct an interview to complete an assessment. Pt gave verbal and POA Girtha Hake [phone] and her son Phinley Schall [phone]. Patient presented in ED today as she felt very weak, dizzy, sweaty and numb in shoulder and arm. According to family she is hard of hearing, has good vision and speech oriented x4. And no 02 required. She is independent with all her ADL and lives in her own home with Yolanda Bonine and his wife and 2 kids. She is widowed and has 2 sons and I living daughter. She has excellent family support. Family would like her to return home but agree to allow LCSW to send out  pt information to SNF incase its a better solution than home health. LCSW completed  Assessment Fl2 started and Passr number obtained and info sent out to [facility]. Reviewed care giver stress and discussed self care to family. No further needs  Employment status:  Retired (Used to be a Consulting civil engineer for [employer]) Insurance information:  Building surveyor) PT Recommendations:  Not assessed at this time Information / Referral to community resources:  Acute Rehab, [place]  Patient/Family's Response to care:  They understand patient will be admitted  Patient/Family's Understanding of and Emotional Response to Diagnosis, Current Treatment, and Prognosis:  Good understanding  Emotional Assessment Appearance:  Appears younger than stated age, Well-Groomed Attitude/Demeanor/Rapport:   (Polite and kind) Affect (typically observed):  Accepting, Calm Orientation:  Oriented to Self, Oriented to Place, Oriented to  Time, Oriented to Situation Alcohol / Substance use:  Not Applicable Psych involvement (Current and /or in the community):  No (Comment), Yes (Comment)  Discharge Needs  Concerns to be addressed:  No discharge needs identified Readmission within the last 30 days:  No Current discharge risk:  None Barriers to Discharge:  Continued Medical Work up   [place], [place], LCSW 04/18/2017, 9:13 AM

## 2017-04-18 NOTE — Progress Notes (Signed)
Pt's bp has gradually dropped since receiving a 1L NS bolus. Systolic now in the 11'E and MAP in the 50's. [place] notified. Waiting for orders.

## 2017-04-18 NOTE — Progress Notes (Addendum)
Clarified 'PRN 1L NS bolus for systolic less than 90' order with Dr. Mortimer Fries. Received orders to discontinue the PRN. Pt's Map goal is now 17.

## 2017-04-18 NOTE — ED Provider Notes (Signed)
 [facility] Emergency Department Provider Note   ____________________________________________   First MD Initiated Contact with Patient 04/18/17 0547     (approximate)  I have reviewed the triage vital signs and the nursing notes.   HISTORY  Chief Complaint Shoulder Pain    HPI Kristin Avila is a 83 y.o. female brought to the ED from home via EMS with a chief complaint of bilateral shoulder blade pain, right greater than left. Patient has a history of atrial fibrillation on Eliquis. Describes pain all over but especially in her shoulder blades with associated difficulty breathing. Daughter states patient had chills earlier in the evening. Denies abdominal pain, nausea, vomiting. Denies recent travel or trauma. Nothing makes her symptoms better or worse.   Past Medical History:  Diagnosis Date  . Anemia   . Anxiety 10/02/2012  . Atrial fibrillation ([facility]) 11/13   new onset 11/13  . BCC (basal cell carcinoma), face 04/24/2016  . Benign essential hypertension 03/13/2015  . Cellulitis of right upper extremity 01/27/2017  . Constipation 01/13/2015  . Decreased hearing 05/21/2015  . Health care maintenance 10/14/2014   Mammogram 10/01/15 - Birads I.    . Hypercholesterolemia   . Hyperlipidemia, mixed 02/01/2017  . Hypertension   . Osteoarthritis    hands, feet  . Sick sinus syndrome ([facility]) 01/04/2017   Overview:  Sp pacer placement 2018  . Syncope 12/08/2016    Patient Active Problem List   Diagnosis Date Noted  . Hyperlipidemia, mixed 02/01/2017  . Cellulitis of right upper extremity 01/27/2017  . Sick sinus syndrome ([facility]) 01/04/2017  . Syncope 12/08/2016  . BCC (basal cell carcinoma), face 04/24/2016  . Decreased hearing 05/21/2015  . Benign essential hypertension 03/13/2015  . Constipation 01/13/2015  . Health care maintenance 10/14/2014  . Anxiety 10/02/2012  . Atrial fibrillation ([facility]) 07/09/2012  . Osteoarthritis 07/08/2012  .  Hypercholesterolemia 07/08/2012  . Hypertension 07/08/2012    Past Surgical History:  Procedure Laterality Date  . APPENDECTOMY  1957  . CATARACT EXTRACTION  2013   left eye  . INCISION AND DRAINAGE ABSCESS Right 01/29/2017   Procedure: INCISION AND DRAINAGE ABSCESS;  Surgeon: Cooper, Richard E, MD;  Location: [facility] ORS;  Service: General;  Laterality: Right;  . KIDNEY SURGERY  age 25  . PACEMAKER INSERTION Left 12/14/2016   Procedure: INSERTION PACEMAKER;  Surgeon: Paraschos, Alexander, MD;  Location: [facility] ORS;  Service: Cardiovascular;  Laterality: Left;  . TUBAL LIGATION      Prior to Admission medications   Medication Sig Start Date End Date Taking? Authorizing Provider  acetaminophen (TYLENOL) 325 MG tablet Take 2 tablets (650 mg total) by mouth every 6 (six) hours as needed for mild pain, moderate pain or fever (or Fever >/= 101). 12/11/16  Yes Gouru, Aruna, MD  amLODipine (NORVASC) 10 MG tablet Take 1 tablet (10 mg total) by mouth daily. 01/21/17  Yes Scott, Charlene, MD  Cholecalciferol (VITAMIN D-3) 1000 UNITS CAPS Take 1 capsule by mouth daily.    Yes [provider]  ELIQUIS 5 MG TABS tablet Take 5 mg by mouth 2 (two) times daily.   Yes [provider]  FLUoxetine (PROZAC) 40 MG capsule Take 1 capsule (40 mg total) by mouth daily. 01/18/17  Yes Scott, Charlene, MD  hydrochlorothiazide (HYDRODIURIL) 25 MG tablet Take 0.5 tablets by mouth daily. 02/03/17  Yes [provider]  lisinopril (PRINIVIL,ZESTRIL) 40 MG tablet Take 1 tablet (40 mg total) by mouth daily. 01/18/17  Yes Scott,   Charlene, MD  lovastatin (MEVACOR) 20 MG tablet Take 1 tablet (20 mg total) by mouth daily. 01/18/17  Yes Scott, Charlene, MD  metoprolol succinate (TOPROL-XL) 50 MG 24 hr tablet TAKE (1) TABLET BY MOUTH DAILY. TAKE WITH OR IMMEDIATELY FOLLOWING A MEAL 01/19/17  Yes Scott, Charlene, MD  naproxen sodium (ANAPROX) 220 MG tablet Take 220 mg by mouth 2 (two) times daily with a meal.   Yes  [provider]  senna-docusate (SENOKOT-S) 8.6-50 MG tablet Take 1 tablet by mouth at bedtime as needed for mild constipation. 12/11/16  Yes Gouru, Aruna, MD  traZODone (DESYREL) 100 MG tablet Take 1 tablet (100 mg total) by mouth at bedtime. 01/18/17  Yes Scott, Charlene, MD    Allergies Patient has no known allergies.  Family History  Problem Relation Age of Onset  . Congestive Heart Failure Father   . Multiple myeloma Mother   . Hypertension Brother   . Alzheimer's disease Unknown        grandmother and great aunts  . Breast cancer Neg Hx   . Colon cancer Neg Hx     Social History Social History  Substance Use Topics  . Smoking status: Never Smoker  . Smokeless tobacco: Never Used  . Alcohol use No     Comment: occasional    Review of Systems  Constitutional: positive for chills. Eyes: No visual changes. ENT: No sore throat. Cardiovascular: Denies chest pain. Respiratory: positive for shortness of breath. Gastrointestinal: No abdominal pain.  No nausea, no vomiting.  No diarrhea.  No constipation. Genitourinary: Negative for dysuria. Musculoskeletal: Negative for back pain. Skin: Negative for rash. Neurological: Negative for headaches, focal weakness or numbness.   ____________________________________________   PHYSICAL EXAM:  VITAL SIGNS: ED Triage Vitals  Enc Vitals Group     BP 04/18/17 0515 (!) 145/77     Pulse Rate 04/18/17 0515 (!) 127     Resp 04/18/17 0515 (!) 26     Temp 04/18/17 0515 97.8 F (36.6 C)     Temp Source 04/18/17 0515 Oral     SpO2 04/18/17 0513 94 %     Weight 04/18/17 0517 170 lb (77.1 kg)     Height 04/18/17 0517 5' 2" (1.575 m)     Head Circumference --      Peak Flow --      Pain Score 04/18/17 0514 5     Pain Loc --      Pain Edu? --      Excl. in GC? --     Constitutional: Alert and oriented. Uncomfortable appearing and in mild acute distress. Eyes: Conjunctivae are normal. PERRL. EOMI. Head:  Atraumatic. Nose: No congestion/rhinnorhea. Mouth/Throat: Mucous membranes are moist.  Oropharynx non-erythematous. Neck: No stridor.   Cardiovascular: tachycardic rate, irregular rhythm. Grossly normal heart sounds.  Good peripheral circulation. Respiratory: increased respiratory effort.  No retractions. Lungs with rales on the right. Gastrointestinal: Soft and nontender. No distention. No abdominal bruits. No CVA tenderness. Musculoskeletal: No lower extremity tenderness nor edema.  No joint effusions. Neurologic:  Normal speech and language. No gross focal neurologic deficits are appreciated.  Skin:  Skin is warm, dry and intact. No rash noted. Psychiatric: Mood and affect are normal. Speech and behavior are normal.  ____________________________________________   LABS (all labs ordered are listed, but only abnormal results are displayed)  Labs Reviewed  TROPONIN I - Abnormal; Notable for the following:       Result Value   Troponin I 0.04 (*)      All other components within normal limits  CBC WITH DIFFERENTIAL/PLATELET - Abnormal; Notable for the following:    Hemoglobin 11.8 (*)    HCT 34.6 (*)    RDW 16.0 (*)    Neutro Abs 7.8 (*)    All other components within normal limits  COMPREHENSIVE METABOLIC PANEL - Abnormal; Notable for the following:    Glucose, Bld 179 (*)    BUN 31 (*)    Creatinine, Ser 1.19 (*)    GFR calc non Af Amer 40 (*)    GFR calc Af Amer 47 (*)    All other components within normal limits   ____________________________________________  EKG  ED ECG REPORT I, , J, the attending physician, personally viewed and interpreted this ECG.   Date: 04/18/2017  EKG Time: 0518  Rate: 116  Rhythm: atrial fibrillation, rate 116  Axis: LAD  Intervals:none  ST&T Change: nonspecific  ____________________________________________  RADIOLOGY  Dg Chest Port 1 View  Result Date: 04/18/2017 CLINICAL DATA:  Diffuse body pain. Pacemaker installed last  week. Altered mental status. EXAM: PORTABLE CHEST 1 VIEW COMPARISON:  Chest radiograph Dec 14, 2016 FINDINGS: New large RIGHT pleural effusion with sub pulmonic component. RIGHT lung base consolidation. RIGHT midlung zone atelectasis. LEFT lung is clear. Cardiac silhouette predominately obscured on the RIGHT. Calcified aortic knob. No pneumothorax. Dual lead LEFT cardiac pacemaker in situ. Severe LEFT glenohumeral osteoarthrosis. IMPRESSION: New large RIGHT pleural effusion with underlying consolidation, associated compressive atelectasis. Recommend follow-up chest radiograph after treatment to verify improvement. Electronically Signed   By: Courtnay  Bloomer M.D.   On: 04/18/2017 05:46    ____________________________________________   PROCEDURES  Procedure(s) performed: None  Procedures  Critical Care performed: Yes, see critical care note(s)   CRITICAL CARE Performed by: , J   Total critical care time: 30 minutes  Critical care time was exclusive of separately billable procedures and treating other patients.  Critical care was necessary to treat or prevent imminent or life-threatening deterioration.  Critical care was time spent personally by me on the following activities: development of treatment plan with patient and/or surrogate as well as nursing, discussions with consultants, evaluation of patient's response to treatment, examination of patient, obtaining history from patient or surrogate, ordering and performing treatments and interventions, ordering and review of laboratory studies, ordering and review of radiographic studies, pulse oximetry and re-evaluation of patient's condition.  ____________________________________________   INITIAL IMPRESSION / ASSESSMENT AND PLAN / ED COURSE  Pertinent labs & imaging results that were available during my care of the patient were reviewed by me and considered in my medical decision making (see chart for details).  83-year-old  female with atrial fibrillation and pacemaker who presents with shoulder blade pain. Patient arrives in atrial fibrillation with RVR. Large right-sided pleural effusion seen on chest x-ray. Will give small bolus Cardizem for rate control. Patient's blood pressure is normotensive. Discussed with hospitalist to evaluate patient in the emergency department for admission.  Clinical Course as of Apr 18 716  Sun Apr 18, 2017  0715 After 5 mg bolus of diltiazem, patient is rate controlled at a rate of 60 with symmetrically paced rhythm. Blood pressure a little soft; will continue to monitor. Consider judicious fluids if blood pressure remains soft.  [JS]    Clinical Course User Index [JS] ,  J, MD     ____________________________________________   FINAL CLINICAL IMPRESSION(S) / ED DIAGNOSES  Final diagnoses:  Atrial fibrillation with rapid ventricular response (HCC)  Pleural effusion  Elevated   troponin      NEW MEDICATIONS STARTED DURING THIS VISIT:  New Prescriptions   No medications on file     Note:  This document was prepared using Dragon voice recognition software and may include unintentional dictation errors.    ,  J, MD 04/18/17 0717  

## 2017-04-19 ENCOUNTER — Inpatient Hospital Stay: Payer: Medicare Other | Admitting: Anesthesiology

## 2017-04-19 ENCOUNTER — Inpatient Hospital Stay: Payer: Medicare Other

## 2017-04-19 ENCOUNTER — Encounter
Admission: EM | Disposition: A | Discharge status: Skilled Nursing Facility | Payer: Self-pay | Source: Home / Self Care | Attending: Internal Medicine

## 2017-04-19 ENCOUNTER — Encounter: Payer: Self-pay | Admitting: Anesthesiology

## 2017-04-19 ENCOUNTER — Other Ambulatory Visit: Payer: Medicare Other

## 2017-04-19 DIAGNOSIS — J90 Pleural effusion, not elsewhere classified: Secondary | ICD-10-CM

## 2017-04-19 DIAGNOSIS — J9601 Acute respiratory failure with hypoxia: Secondary | ICD-10-CM

## 2017-04-19 DIAGNOSIS — J942 Hemothorax: Principal | ICD-10-CM

## 2017-04-19 HISTORY — PX: VIDEO ASSISTED THORACOSCOPY (VATS)/THOROCOTOMY: SHX6173

## 2017-04-19 LAB — CBC
HCT: 25.9 % — ABNORMAL LOW (ref 35.0–47.0)
HCT: 24 % — ABNORMAL LOW (ref 35.0–47.0)
HEMOGLOBIN: 8.8 g/dL — AB (ref 12.0–16.0)
HEMOGLOBIN: 8 g/dL — AB (ref 12.0–16.0)
MCH: 28.3 pg (ref 26.0–34.0)
MCH: 28.6 pg (ref 26.0–34.0)
MCHC: 33.9 g/dL (ref 32.0–36.0)
MCHC: 33.4 g/dL (ref 32.0–36.0)
MCV: 83.7 fL (ref 80.0–100.0)
MCV: 85.5 fL (ref 80.0–100.0)
PLATELETS: 180 10*3/uL (ref 150–440)
Platelets: 165 10*3/uL (ref 150–440)
RBC: 3.1 MIL/uL — AB (ref 3.80–5.20)
RBC: 2.81 MIL/uL — AB (ref 3.80–5.20)
RDW: 15.7 % — ABNORMAL HIGH (ref 11.5–14.5)
RDW: 15.9 % — ABNORMAL HIGH (ref 11.5–14.5)
WBC: 10.6 10*3/uL (ref 3.6–11.0)
WBC: 13.5 10*3/uL — ABNORMAL HIGH (ref 3.6–11.0)

## 2017-04-19 LAB — PROTEIN, PLEURAL OR PERITONEAL FLUID: TOTAL PROTEIN, FLUID: 5 g/dL

## 2017-04-19 LAB — BASIC METABOLIC PANEL
ANION GAP: 8 (ref 5–15)
Anion gap: 6 (ref 5–15)
BUN: 37 mg/dL — ABNORMAL HIGH (ref 6–20)
BUN: 31 mg/dL — ABNORMAL HIGH (ref 6–20)
CHLORIDE: 108 mmol/L (ref 101–111)
CHLORIDE: 110 mmol/L (ref 101–111)
CO2: 23 mmol/L (ref 22–32)
CO2: 23 mmol/L (ref 22–32)
CREATININE: 1.49 mg/dL — AB (ref 0.44–1.00)
CREATININE: 1.07 mg/dL — AB (ref 0.44–1.00)
Calcium: 8.6 mg/dL — ABNORMAL LOW (ref 8.9–10.3)
Calcium: 8.3 mg/dL — ABNORMAL LOW (ref 8.9–10.3)
GFR calc non Af Amer: 31 mL/min — ABNORMAL LOW (ref >60)
GFR calc non Af Amer: 46 mL/min — ABNORMAL LOW (ref >60)
GFR, EST AFRICAN AMERICAN: 35 mL/min — AB (ref >60)
GFR, EST AFRICAN AMERICAN: 53 mL/min — AB (ref >60)
Glucose, Bld: 120 mg/dL — ABNORMAL HIGH (ref 65–99)
Glucose, Bld: 141 mg/dL — ABNORMAL HIGH (ref 65–99)
POTASSIUM: 4.1 mmol/L (ref 3.5–5.1)
Potassium: 4 mmol/L (ref 3.5–5.1)
SODIUM: 139 mmol/L (ref 135–145)
Sodium: 139 mmol/L (ref 135–145)

## 2017-04-19 LAB — BODY FLUID CELL COUNT WITH DIFFERENTIAL
EOS FL: 1 %
Lymphs, Fluid: 29 %
Monocyte-Macrophage-Serous Fluid: 3 %
NEUTROPHIL FLUID: 67 %
OTHER CELLS FL: 0 %
WBC FLUID: 1449 uL

## 2017-04-19 LAB — PREPARE RBC (CROSSMATCH)

## 2017-04-19 LAB — MAGNESIUM: MAGNESIUM: 1.9 mg/dL (ref 1.7–2.4)

## 2017-04-19 LAB — PHOSPHORUS: Phosphorus: 3.6 mg/dL (ref 2.5–4.6)

## 2017-04-19 LAB — GLUCOSE, PLEURAL OR PERITONEAL FLUID: Glucose, Fluid: 85 mg/dL

## 2017-04-19 LAB — AMYLASE, PLEURAL OR PERITONEAL FLUID: AMYLASE FL: 56 U/L

## 2017-04-19 LAB — LACTATE DEHYDROGENASE, PLEURAL OR PERITONEAL FLUID: LD, Fluid: 176 U/L — ABNORMAL HIGH (ref 3–23)

## 2017-04-19 LAB — ABO/RH: ABO/RH(D): O POS

## 2017-04-19 LAB — ALBUMIN, PLEURAL OR PERITONEAL FLUID: Albumin, Fluid: 3.1 g/dL

## 2017-04-19 SURGERY — VIDEO ASSISTED THORACOSCOPY (VATS)/THOROCOTOMY
Anesthesia: General | Laterality: Right | Wound class: Clean

## 2017-04-19 MED ORDER — SUGAMMADEX SODIUM 500 MG/5ML IV SOLN
INTRAVENOUS | Status: DC | PRN
  Administered 2017-04-19: 300 mg via INTRAVENOUS

## 2017-04-19 MED ORDER — SODIUM CHLORIDE 0.9 % IV SOLN
INTRAVENOUS | Status: DC | PRN
  Administered 2017-04-19: 14:00:00 via INTRAVENOUS

## 2017-04-19 MED ORDER — ROCURONIUM BROMIDE 100 MG/10ML IV SOLN
INTRAVENOUS | Status: DC | PRN
  Administered 2017-04-19: 20 mg via INTRAVENOUS
  Administered 2017-04-19: 15 mg via INTRAVENOUS
  Administered 2017-04-19: 5 mg via INTRAVENOUS
  Administered 2017-04-19: 30 mg via INTRAVENOUS

## 2017-04-19 MED ORDER — ONDANSETRON HCL 4 MG/2ML IJ SOLN
INTRAMUSCULAR | Status: DC | PRN
  Administered 2017-04-19: 4 mg via INTRAVENOUS

## 2017-04-19 MED ORDER — MORPHINE SULFATE (PF) 4 MG/ML IV SOLN
1.0000 mg | INTRAVENOUS | Status: DC | PRN
  Administered 2017-04-20 (×2): 2 mg via INTRAVENOUS
  Filled 2017-04-19 (×2): qty 1

## 2017-04-19 MED ORDER — ETOMIDATE 2 MG/ML IV SOLN
INTRAVENOUS | Status: DC | PRN
  Administered 2017-04-19: 15 mg via INTRAVENOUS

## 2017-04-19 MED ORDER — SUCCINYLCHOLINE CHLORIDE 20 MG/ML IJ SOLN
INTRAMUSCULAR | Status: DC | PRN
  Administered 2017-04-19: 100 mg via INTRAVENOUS

## 2017-04-19 MED ORDER — LIDOCAINE HCL (CARDIAC) 20 MG/ML IV SOLN
INTRAVENOUS | Status: DC | PRN
Start: 2017-04-19 — End: 2017-04-19
  Administered 2017-04-19: 40 mg via INTRAVENOUS

## 2017-04-19 MED ORDER — ONDANSETRON HCL 4 MG/2ML IJ SOLN
INTRAMUSCULAR | Status: AC
  Filled 2017-04-19: qty 2

## 2017-04-19 MED ORDER — FUROSEMIDE 10 MG/ML IJ SOLN
40.0000 mg | Freq: Once | INTRAMUSCULAR | Status: AC
  Administered 2017-04-19: 40 mg via INTRAVENOUS
  Filled 2017-04-19: qty 4

## 2017-04-19 MED ORDER — DEXTROSE 5 % IV SOLN
1.5000 g | Freq: Two times a day (BID) | INTRAVENOUS | Status: DC
  Filled 2017-04-19 (×3): qty 1.5

## 2017-04-19 MED ORDER — EPHEDRINE SULFATE 50 MG/ML IJ SOLN
INTRAMUSCULAR | Status: AC
  Filled 2017-04-19: qty 1

## 2017-04-19 MED ORDER — FENTANYL CITRATE (PF) 100 MCG/2ML IJ SOLN
INTRAMUSCULAR | Status: AC
  Filled 2017-04-19: qty 2

## 2017-04-19 MED ORDER — ROCURONIUM BROMIDE 50 MG/5ML IV SOLN
INTRAVENOUS | Status: AC
  Filled 2017-04-19: qty 1

## 2017-04-19 MED ORDER — SODIUM CHLORIDE 0.9 % IV SOLN
Freq: Once | INTRAVENOUS | Status: AC
  Administered 2017-04-19: 13:00:00 via INTRAVENOUS

## 2017-04-19 MED ORDER — CEFAZOLIN SODIUM-DEXTROSE 2-4 GM/100ML-% IV SOLN
2.0000 g | INTRAVENOUS | Status: AC
  Administered 2017-04-19: 2 g via INTRAVENOUS
  Filled 2017-04-19: qty 100

## 2017-04-19 MED ORDER — ETOMIDATE 2 MG/ML IV SOLN
INTRAVENOUS | Status: AC
  Filled 2017-04-19: qty 10

## 2017-04-19 MED ORDER — FENTANYL CITRATE (PF) 100 MCG/2ML IJ SOLN
INTRAMUSCULAR | Status: DC | PRN
  Administered 2017-04-19 (×3): 50 ug via INTRAVENOUS
  Administered 2017-04-19: 25 ug via INTRAVENOUS

## 2017-04-19 MED ORDER — SODIUM CHLORIDE 0.9 % IJ SOLN
INTRAMUSCULAR | Status: AC
  Filled 2017-04-19: qty 10

## 2017-04-19 MED ORDER — SODIUM CHLORIDE 0.9 % IV SOLN
Freq: Once | INTRAVENOUS | Status: AC
  Administered 2017-04-19: 15:00:00 via INTRAVENOUS

## 2017-04-19 MED ORDER — BUPIVACAINE HCL (PF) 0.5 % IJ SOLN
INTRAMUSCULAR | Status: DC | PRN
  Administered 2017-04-19: 25 mL

## 2017-04-19 MED ORDER — FENTANYL CITRATE (PF) 100 MCG/2ML IJ SOLN: 25.0000 ug | INTRAMUSCULAR | Status: DC | PRN

## 2017-04-19 MED ORDER — EMPTY CONTAINERS FLEXIBLE MISC
50.0000 [IU]/kg | Status: DC
  Filled 2017-04-19: qty 150

## 2017-04-19 MED ORDER — BUPIVACAINE HCL (PF) 0.5 % IJ SOLN
INTRAMUSCULAR | Status: AC
  Filled 2017-04-19: qty 30

## 2017-04-19 MED ORDER — PROTHROMBIN COMPLEX CONC HUMAN 500 UNITS IV KIT
3500.0000 [IU] | PACK | Status: AC
  Administered 2017-04-19: 3500 [IU] via INTRAVENOUS
  Filled 2017-04-19: qty 120

## 2017-04-19 MED ORDER — DEXAMETHASONE SODIUM PHOSPHATE 10 MG/ML IJ SOLN
INTRAMUSCULAR | Status: AC
  Filled 2017-04-19: qty 1

## 2017-04-19 MED ORDER — MORPHINE SULFATE (PF) 2 MG/ML IV SOLN: 2.0000 mg | Freq: Once | INTRAVENOUS | Status: DC

## 2017-04-19 MED ORDER — DEXAMETHASONE SODIUM PHOSPHATE 10 MG/ML IJ SOLN
INTRAMUSCULAR | Status: DC | PRN
  Administered 2017-04-19: 10 mg via INTRAVENOUS

## 2017-04-19 MED ORDER — DEXTROSE-NACL 5-0.45 % IV SOLN
INTRAVENOUS | Status: DC
  Administered 2017-04-19: 19:00:00 via INTRAVENOUS
  Administered 2017-04-20: 75 mL/h via INTRAVENOUS

## 2017-04-19 MED ORDER — CEFUROXIME SODIUM 1.5 G IV SOLR
1.5000 g | Freq: Two times a day (BID) | INTRAVENOUS | Status: AC
  Administered 2017-04-19: 1.5 g via INTRAVENOUS
  Filled 2017-04-19: qty 1.5

## 2017-04-19 MED ORDER — PHENYLEPHRINE HCL 10 MG/ML IJ SOLN
INTRAMUSCULAR | Status: DC | PRN
  Administered 2017-04-19 (×4): 100 ug via INTRAVENOUS

## 2017-04-19 MED ORDER — ONDANSETRON HCL 4 MG/2ML IJ SOLN: 4.0000 mg | Freq: Once | INTRAMUSCULAR | Status: DC | PRN

## 2017-04-19 MED ORDER — SUGAMMADEX SODIUM 500 MG/5ML IV SOLN
INTRAVENOUS | Status: AC
  Filled 2017-04-19: qty 5

## 2017-04-19 SURGICAL SUPPLY — 66 items
BENZOIN TINCTURE PRP APPL 2/3 (GAUZE/BANDAGES/DRESSINGS) ×2 IMPLANT
BNDG COHESIVE 4X5 TAN STRL (GAUZE/BANDAGES/DRESSINGS) IMPLANT
BRONCHOSCOPE PED SLIM DISP (MISCELLANEOUS) ×2 IMPLANT
CANISTER SUCT 1200ML W/VALVE (MISCELLANEOUS) ×2 IMPLANT
CATH URET ROBINSON 16FR STRL (CATHETERS) ×2 IMPLANT
CHLORAPREP W/TINT 26ML (MISCELLANEOUS) ×4 IMPLANT
CNTNR SPEC 2.5X3XGRAD LEK (MISCELLANEOUS)
CONN REDUCER 3/8X3/8X3/8Y (CONNECTOR) ×2
CONNECTOR REDUCER 3/8X3/8X3/8Y (CONNECTOR) ×1 IMPLANT
CONT SPEC 4OZ STER OR WHT (MISCELLANEOUS)
CONTAINER SPEC 2.5X3XGRAD LEK (MISCELLANEOUS) IMPLANT
CUTTER ECHEON FLEX ENDO 45 340 (ENDOMECHANICALS) IMPLANT
DRAIN CHEST DRY SUCT SGL (MISCELLANEOUS) ×2 IMPLANT
DRAPE C-SECTION (MISCELLANEOUS) ×2 IMPLANT
DRAPE MAG INST 16X20 L/F (DRAPES) ×2 IMPLANT
DRSG OPSITE POSTOP 3X4 (GAUZE/BANDAGES/DRESSINGS) ×4 IMPLANT
DRSG OPSITE POSTOP 4X6 (GAUZE/BANDAGES/DRESSINGS) IMPLANT
DRSG OPSITE POSTOP 4X8 (GAUZE/BANDAGES/DRESSINGS) IMPLANT
DRSG TELFA 3X8 NADH (GAUZE/BANDAGES/DRESSINGS) IMPLANT
ELECT BLADE 6.5 EXT (BLADE) ×2 IMPLANT
ELECT CAUTERY BLADE TIP 2.5 (TIP) ×2
ELECT REM PT RETURN 9FT ADLT (ELECTROSURGICAL) ×2
ELECTRODE CAUTERY BLDE TIP 2.5 (TIP) ×1 IMPLANT
ELECTRODE REM PT RTRN 9FT ADLT (ELECTROSURGICAL) ×1 IMPLANT
GAUZE SPONGE 4X4 12PLY STRL (GAUZE/BANDAGES/DRESSINGS) ×2 IMPLANT
GLOVE SURG SYN 7.5  E (GLOVE) ×7
GLOVE SURG SYN 7.5 E (GLOVE) ×7 IMPLANT
GOWN STRL REUS W/ TWL LRG LVL3 (GOWN DISPOSABLE) ×4 IMPLANT
GOWN STRL REUS W/TWL LRG LVL3 (GOWN DISPOSABLE) ×4
KIT RM TURNOVER STRD PROC AR (KITS) ×2 IMPLANT
LABEL OR SOLS (LABEL) IMPLANT
LOOP RED MAXI  1X406MM (MISCELLANEOUS) ×1
LOOP VESSEL MAXI 1X406 RED (MISCELLANEOUS) ×1 IMPLANT
MARKER SKIN DUAL TIP RULER LAB (MISCELLANEOUS) ×2 IMPLANT
NEEDLE HYPO 22GX1.5 SAFETY (NEEDLE) ×2 IMPLANT
PACK BASIN MAJOR ARMC (MISCELLANEOUS) ×2 IMPLANT
RELOAD STAPLER LINE PROX 30 GR (STAPLE) IMPLANT
SOL ANTI-FOG 6CC FOG-OUT (MISCELLANEOUS) ×1 IMPLANT
SOL FOG-OUT ANTI-FOG 6CC (MISCELLANEOUS) ×1
SPONGE KITTNER 5P (MISCELLANEOUS) ×2 IMPLANT
STAPLER RELOAD LINE PROX 30 GR (STAPLE)
STAPLER SKIN PROX 35W (STAPLE) IMPLANT
STAPLER VASCULAR ECHELON 35 (CUTTER) IMPLANT
STRIP CLOSURE SKIN 1/2X4 (GAUZE/BANDAGES/DRESSINGS) ×4 IMPLANT
SUT ETHILON 3-0 FS-10 30 BLK (SUTURE) ×2
SUT MNCRL AB 3-0 PS2 27 (SUTURE) ×2 IMPLANT
SUT PROLENE 5 0 RB 1 DA (SUTURE) IMPLANT
SUT SILK 0 (SUTURE) ×1
SUT SILK 0 30XBRD TIE 6 (SUTURE) ×1 IMPLANT
SUT SILK 1 SH (SUTURE) ×16 IMPLANT
SUT VIC AB 0 CT1 36 (SUTURE) ×4 IMPLANT
SUT VIC AB 2-0 CT1 27 (SUTURE) ×2
SUT VIC AB 2-0 CT1 TAPERPNT 27 (SUTURE) ×2 IMPLANT
SUT VICRYL 2 TP 1 (SUTURE) ×4 IMPLANT
SUTURE EHLN 3-0 FS-10 30 BLK (SUTURE) ×1 IMPLANT
SYR 10ML LL (SYRINGE) ×2 IMPLANT
SYR 10ML SLIP (SYRINGE) IMPLANT
SYR BULB IRRIG 60ML STRL (SYRINGE) ×2 IMPLANT
TAPE ADH 3 LX (MISCELLANEOUS) ×2 IMPLANT
TAPE TRANSPORE STRL 2 31045 (GAUZE/BANDAGES/DRESSINGS) IMPLANT
TRAY FOLEY W/METER SILVER 16FR (SET/KITS/TRAYS/PACK) IMPLANT
TROCAR FLEXIPATH 20X80 (ENDOMECHANICALS) ×4 IMPLANT
TROCAR FLEXIPATH THORACIC 15MM (ENDOMECHANICALS) IMPLANT
TUBING CONNECTING 10 (TUBING) IMPLANT
WATER STERILE IRR 1000ML POUR (IV SOLUTION) ×2 IMPLANT
YANKAUER SUCT BULB TIP FLEX NO (MISCELLANEOUS) ×2 IMPLANT

## 2017-04-19 NOTE — Progress Notes (Signed)
Placed patient on HFNC 50%40L due to low sats while sleeping. Tolerated well. NP M. Tukov aware

## 2017-04-19 NOTE — Op Note (Signed)
  04/18/2017 - 04/19/2017  5:02 PM  PATIENT:  Kristin Avila  83 y.o. female  PRE-OPERATIVE DIAGNOSIS:  Right sided blood in chest cavity  POST-OPERATIVE DIAGNOSIS:  same  PROCEDURE:  Preoperative bronchoscopy to assess endobronchial anatomy with right thoracoscopy and evacuation of right hemothorax  SURGEON:  Surgeon(s) and Role:    * Nestor Lewandowsky, MD - Primary    * Clayburn Pert, MD - Assisting  ASSISTANTS: Vira Blanco PA-S  ANESTHESIA: eneral  INDICATIONS FOR PROCEDURE right sided hemothorax  DICTATION: this is an 83 year old woman who is o atrial fibrillation. She experienced the acute onset of right-sided chestyesterday and over thours was found to have a largright-sided hemothorax. An attempt a percutaneous drain is unsuccessful she was offered the above-named procedure for definitve diagnosis and treatment.  The patient was brought to the operating suite and placed in supine position. General endotracheal anesthesia was given through a double-lumen tube. Preoperative bronchoscopy was carried out. There is no evidence of tumor on the right side. The patient was then turned for right thoracoscopy. All pressure points were carefully padded. Patient was prepped and draped in usual sterile fashion.  Two thoracoscopy ports, to accommodate 20 mm trocar were made in the inferior and anterior aspects of the right hemithorax.   Immediately upon entering thechest there was 2 L of frank blood present. After both ports were created another 500 cc of clot was also removed from the chest. After complete removal of all blood products the chest was copiously irrigated. We had a good look at all aspects of the ribs lung mediastinum and diaphragm. There is no ongoing hemorrhage. The chest was drained with a 32 French straight tube to the apex and a 32 Blake along the paravertebral space. These were secured with silk suture through separate stab wounds. The thoracoscopy ports were then closed in  multiple layers using running absorbable sutures followed by skin closure with nylon. Strile dressings were applied. The patient was tn extubated and takento the recovery room in stable condition   Nestor Lewandowsky, MD

## 2017-04-19 NOTE — Progress Notes (Signed)
Patient ID: Kristin Avila, female   DOB: 11-16-1927, 83 y.o.   MRN: [id number]  Chief Complaint  Patient presents with  . Shoulder Pain    Referred By Dr. Gwendolyn Lima Reason for Referral right pleural effusion  HPI Location, Quality, Duration, Severity, Timing, Context, Modifying Factors, Associated Signs and Symptoms.  Kristin Avila is a 83 y.o. female.  She lives with her grandson however her daughter was staying with her over the weekend. The patient went to the bathroom and called her daughter stating that she had exquisite pain on the right side of her chest. The daughter said the patient appeared in distress and she was subsequently brought to the emergency room. Her chest x-ray initially showed a moderate size pleural effusion on the right and this morning it had enlarged. She underwent an ultrasound-guided thoracentesis. According to Dr. Jamal Collin who is present at the bedside only 75 cc of frank blood was removed. The rest was clotted and could not be extracted. I was called to see the patient. She is currently resting comfortably in bed. Although she is in no distress she does not appear very communicative and defers questions to her family who are at her bedside. The family states that she's been on Elavil Chris and Naprosyn. She takes alcohol use for atrial fibrillation. She had a pacemaker placed back in May of this year by Dr. Tomasita Crumble show and I discussed the case with him. He is reviewed the operative notes and believes that we can proceed with surgery. He will asked the [employer] representative to increase her underlying heart rate some. The patient states that she's had no recent pulmonary symptoms. She's had no cough or fever or chills. The family states that she's been in excellent health and over the weekend was able to go out shopping. She was able to walk around the store without any difficulties. She has a history of kidney surgery in the past as well as a midline abdominal  incision. She does not endorse any cardiac problems or lung problems prior to her surgery with the exception of her atrial fibrillation for which she is seen Dr. Tomasita Crumble show and Dr. Candis Musa.   Past Medical History:  Diagnosis Date  . Anemia   . Anxiety 10/02/2012  . Atrial fibrillation ([place]) 11/13   new onset 11/13  . BCC (basal cell carcinoma), face 04/24/2016  . Benign essential hypertension 03/13/2015  . Cellulitis of right upper extremity 01/27/2017  . Constipation 01/13/2015  . Decreased hearing 05/21/2015  . Health care maintenance 10/14/2014   Mammogram 10/01/15 - Birads I.    . Hypercholesterolemia   . Hyperlipidemia, mixed 02/01/2017  . Hypertension   . Osteoarthritis    hands, feet  . Sick sinus syndrome ([place]) 01/04/2017   Overview:  Sp pacer placement 2018  . Syncope 12/08/2016    Past Surgical History:  Procedure Laterality Date  . APPENDECTOMY  1957  . CATARACT EXTRACTION  2013   left eye  . INCISION AND DRAINAGE ABSCESS Right 01/29/2017   Procedure: INCISION AND DRAINAGE ABSCESS;  Surgeon: Florene Glen, MD;  Location: [facility] ORS;  Service: General;  Laterality: Right;  . KIDNEY SURGERY  age 39  . PACEMAKER INSERTION Left 12/14/2016   Procedure: INSERTION PACEMAKER;  Surgeon: Isaias Cowman, MD;  Location: [facility] ORS;  Service: Cardiovascular;  Laterality: Left;  . TUBAL LIGATION      Family History  Problem Relation Age of Onset  . Congestive Heart Failure Father   .  Multiple myeloma Mother   . Hypertension Brother   . Alzheimer's disease Unknown        grandmother and great aunts  . Breast cancer Neg Hx   . Colon cancer Neg Hx     Social History Social History  Substance Use Topics  . Smoking status: Never Smoker  . Smokeless tobacco: Never Used  . Alcohol use No     Comment: occasional    No Known Allergies  Current Facility-Administered Medications  Medication Dose Route Frequency Provider Last Rate Last Dose  . acetaminophen (TYLENOL) tablet 650  mg  650 mg Oral Q6H PRN Demetrios Loll, MD       Or  . acetaminophen (TYLENOL) suppository 650 mg  650 mg Rectal Q6H PRN Demetrios Loll, MD      . albuterol (PROVENTIL) (2.5 MG/3ML) 0.083% nebulizer solution 2.5 mg  2.5 mg Nebulization Q2H PRN Demetrios Loll, MD      . bisacodyl (DULCOLAX) EC tablet 5 mg  5 mg Oral Daily PRN Demetrios Loll, MD      . ceFAZolin (ANCEF) IVPB 2g/100 mL premix  2 g Intravenous On Call to Louise, MD      . diltiazem (CARDIZEM) injection 5 mg  5 mg Intravenous Once Paulette Blanch, MD   Stopped at 04/18/17 0703  . FLUoxetine (PROZAC) capsule 40 mg  40 mg Oral Daily Demetrios Loll, MD   40 mg at 04/19/17 0911  . HYDROcodone-acetaminophen (NORCO/VICODIN) 5-325 MG per tablet 1-2 tablet  1-2 tablet Oral Q4H PRN Demetrios Loll, MD   1 tablet at 04/19/17 0615  . ketorolac (TORADOL) 15 MG/ML injection 15 mg  15 mg Intravenous Q6H PRN Demetrios Loll, MD   15 mg at 04/18/17 1754  . MEDLINE mouth rinse  15 mL Mouth Rinse BID Demetrios Loll, MD   15 mL at 04/19/17 0911  . ondansetron (ZOFRAN) injection 4 mg  4 mg Intravenous Q6H PRN Demetrios Loll, MD      . pneumococcal 23 valent vaccine (PNU-IMMUNE) injection 0.5 mL  0.5 mL Intramuscular Tomorrow-1000 Demetrios Loll, MD      . prothrombin complex conc human (KCENTRA) IVPB 3,500 Units  3,500 Units Intravenous Everlene Farrier, MD 336 mL/hr at 04/19/17 1307 3,500 Units at 04/19/17 1307  . senna-docusate (Senokot-S) tablet 1 tablet  1 tablet Oral QHS PRN Demetrios Loll, MD      . traZODone (DESYREL) tablet 100 mg  100 mg Oral QHS Demetrios Loll, MD   100 mg at 04/18/17 2102      Review of Systems A complete review of systems was asked and was negative except for the following positive findings right sided chest pain with some shortness of breath.  Blood pressure 129/68, pulse 61, temperature 97.8 F (36.6 C), temperature source Oral, resp. rate (!) 26, height _0  (1.575 m), weight 164 lb 14.5 oz (74.8 kg), SpO2 91 %.  Physical Exam CONSTITUTIONAL:  Pleasant,  well-developed, well-nourished, and in no acute distress.  She is conversant although she often defers to her family who are at her bedside. EYES: Pupils equal and reactive to light, Sclera non-icteric EARS, NOSE, MOUTH AND THROAT:  The oropharynx was clear.  Oral mucosa pink and moist. LYMPH NODES:  Lymph nodes in the neck and axillae were normal RESPIRATORY:  Lungs were clear on the left and absent breath sounds on the right..  Normal respiratory effort without pathologic use of accessory muscles of respiration CARDIOVASCULAR: Heart was regular without murmurs.  There were no carotid bruits. GI: The abdomen was soft, nontender, and nondistended. There were no palpable masses. There was no hepatosplenomegaly. There were normal bowel sounds in all quadrants. GU:  Rectal deferred.   MUSCULOSKELETAL:  Normal muscle strength and tone.  No clubbing or cyanosis.   SKIN:  There were no pathologic skin lesions.  There were no nodules on palpation. NEUROLOGIC:  Sensation is normal.  Cranial nerves are grossly intact. PSYCH:    Mood and affect are normal.  Data Reviewed Chest x-rays  I have personally reviewed the patient's imaging, laboratory findings and medical records.    Assessment    I have discussed her care with the intensive care unit physicians, the cardiologist and the anesthesiologist. I believe that she most likely has a tension hemothorax secondary talc was. I reviewed with the patient and her family the indications and risks of chest tube insertion were a thoracoscopy possible thoracotomy with evacuation of hemothorax. We will administer concentrated factor X and type and cross the patient for 4 units of blood and administer some now. Her hemoglobin has gone from 11.8-8.8 and I believe she could benefit from some blood. The family is aware of all the complications that may occur including bleeding, infection, stroke, continued blood loss and death.    Plan    We will plan on urgent  thoracoscopy possible thoracotomy for evacuation of her hemothorax. All questions were answered. The family appears to understand the gravity of the situation.         Nestor Lewandowsky, MD 04/19/2017, 1:27 PM

## 2017-04-19 NOTE — Transfer of Care (Signed)
Immediate Anesthesia Transfer of Care Note  Patient: Kristin Avila  Procedure(s) Performed: Procedure(s): RIGHT THORACOSCOPY AND EVACUATION OF HEMOTHORAX (Right)  Patient Location: PACU  Anesthesia Type:General  Level of Consciousness: sedated  Airway & Oxygen Therapy: Patient Spontanous Breathing and Patient connected to face mask oxygen  Post-op Assessment: Report given to RN and Post -op Vital signs reviewed and stable  Post vital signs: Reviewed and stable  Last Vitals:  Vitals:   04/19/17 1420 04/19/17 1700  BP: (!) 165/63 (!) 142/65  Pulse: 80 70  Resp: (!) 34 20  Temp:  (!) 36.4 C  SpO2: (!) 19% 012%    Complications: No apparent anesthesia complications

## 2017-04-19 NOTE — Progress Notes (Signed)
PT Cancellation Note  Patient Details Name: Kristin Avila MRN: [id number] DOB: [birth year]   Cancelled Treatment:    Reason Eval/Treat Not Completed: Medical issues which prohibited therapy.  PT consult received.  Nursing reports pt going to OR soon (per notes plan to evacuate blood from pt's R lung).  Will re-attempt PT eval at a later date/time as medically appropriate.  Leitha Bleak, PT 04/19/17, 12:44 PM [phone]

## 2017-04-19 NOTE — Progress Notes (Signed)
Patient to pre op for thorascopy or possible thoracotomy, procedure explained to family by surgeon and anesthesia, consent signed by son-in-law with daughter's permission, T&S performed, KCentra reversal agent infused, pt taken to Pre Op by writer and transporter with O2 tank to canula, VSS on X2, CCU pump and pole went with her w/ NS infusing at [facility], no pain issues, pt handed over to pre op [facility] [employer]

## 2017-04-19 NOTE — Telephone Encounter (Signed)
Called Randi and let her know that patient was admitted l/m with information.

## 2017-04-19 NOTE — Anesthesia Post-op Follow-up Note (Signed)
Anesthesia QCDR form completed.        

## 2017-04-19 NOTE — Progress Notes (Signed)
Type and Screen, and cross match 4 units per Dr Faith Rogue.  Patient will go to the OR ASAP to evacuate blood from her right lung

## 2017-04-19 NOTE — Anesthesia Procedure Notes (Signed)
Arterial Line Insertion Performed by: Lance Muss, CRNA  Preanesthetic checklist: patient identified, IV checked, site marked, risks and benefits discussed, surgical consent, monitors and equipment checked, pre-op evaluation, timeout performed and anesthesia consent Right, radial was placed Catheter size: 20 G Hand hygiene performed  and maximum sterile barriers used   Attempts: 1 Following insertion, Biopatch and dressing applied.

## 2017-04-19 NOTE — Progress Notes (Signed)
Pt went into SVT during lab draw. Patient asymptomatic. SVT continued after lab draw. NP notified and instructed to monitor.

## 2017-04-19 NOTE — Progress Notes (Signed)
PULMONARY / CRITICAL CARE MEDICINE   Name: Kristin Avila MRN: [id number] DOB: Jul 21, 1928    ADMISSION DATE:  04/18/2017  PT PROFILE: 67 F with history of chronic atrial fibrillation, SSS, status post PPM admitted 09/09 with right shoulder pain, AFRVR, new finding of right pleural effusion.  MAJOR EVENTS/TEST RESULTS: 09/09 adm to SDU for management of AFRVR. Requiring HFNC O2 09/10 Hgb drop of 3 g/dL. Markedly increased right pleural effusion with mediastinal shift to left. US guided thoracentesis revealed frank blood. Thoracic surgery consultation. Patient to the OR for thoracoscopy  INDWELLING DEVICES::   MICRO DATA: MRSA PCR 09/09 >> NEG  ANTIMICROBIALS:  None    SUBJECTIVE:  Pleasant, no distress, no new complaints  VITAL SIGNS: BP (!) 165/63 (BP Location: Left Arm)   Pulse 80   Temp 97.8 F (36.6 C) (Oral)   Resp (!) 34   Ht 5\' 2"  (1.575 m)   Wt 74.8 kg (164 lb 14.5 oz)   SpO2 (!) 89%   BMI 30.16 kg/m   HEMODYNAMICS:    VENTILATOR SETTINGS: FiO2 (%):  [35 %-94 %] 94 %  INTAKE / OUTPUT: I/O last 3 completed shifts: In: 2222.5 [P.O.:50; I.V.:1172.5; IV Piggyback:1000] Out: 715 [Urine:715]  PHYSICAL EXAMINATION:  Gen: NAD HEENT: NCAT, sclerae white, oropharynx normal Neck: No JVD Lungs: Bronchial breath sounds on right, diminished in right base, no wheezes Cardiovascular: Reg (paced), no M noted Abdomen: Soft, NT, +BS Ext: no C/C/E Neuro: PERRL, EOMI, motor/sensory grossly intact   LABS:  BMET  Recent Labs Lab 04/18/17 0526 04/19/17 0451  NA 139 139  K 4.1 4.0  CL 106 108  CO2 25 23  BUN 31* 37*  CREATININE 1.19* 1.49*  GLUCOSE 179* 120*    Electrolytes  Recent Labs Lab 04/18/17 0526 04/19/17 0451  CALCIUM 9.1 8.6*  MG  --  1.9  PHOS  --  3.6    CBC  Recent Labs Lab 04/18/17 0526 04/19/17 0451  WBC 10.1 10.6  HGB 11.8* 8.8*  HCT 34.6* 25.9*  PLT 201 180    Coag's No results for input(s): APTT, INR in the last  168 hours.  Sepsis Markers No results for input(s): LATICACIDVEN, PROCALCITON, O2SATVEN in the last 168 hours.  ABG No results for input(s): PHART, PCO2ART, PO2ART in the last 168 hours.  Liver Enzymes  Recent Labs Lab 04/18/17 0526  AST 36  ALT 25  ALKPHOS 63  BILITOT 0.7  ALBUMIN 3.7    Cardiac Enzymes  Recent Labs Lab 04/18/17 0526 04/18/17 1106  TROPONINI 0.04* 0.09*    Glucose  Recent Labs Lab 04/18/17 1048 04/18/17 1631  GLUCAP 112* 138*    CXR: Whiteout of right hemithorax    ASSESSMENT / PLAN: Acute hypoxemic respiratory failure R tension hemothorax Chronic atrial fibrillation - presently controlled Acute blood loss anemia due to hemothorax  Thoracic surgery consultation Patient's son-in-law updated on findings and current concerns Typed and crossed for 2 units PRBCs Continue current management for atrial fibrillation Transfuse as needed for hemoglobin less than 7 g/dL  Discussed with Dr. Genevive Bi and Dr. Alvy Bimler, MD PCCM service Mobile [phone] Pager [phone] 04/19/2017 3:02 PM

## 2017-04-19 NOTE — Progress Notes (Signed)
MEDICATION RELATED CONSULT NOTE - INITIAL   Pharmacy Consult for Reversal of Apixaban Indication: emergent surgery for hemothorax  No Known Allergies  Patient Measurements: Height: 5\' 2"  (157.5 cm) Weight: 164 lb 14.5 oz (74.8 kg) IBW/kg (Calculated) : 50.1 Adjusted Body Weight:   Vital Signs: Temp: 97.8 F (36.6 C) (09/10 1200) Temp Source: Oral (09/10 1200) BP: 129/68 (09/10 1200) Pulse Rate: 59 (09/10 1200) Intake/Output from previous day: 09/09 0701 - 09/10 0700 In: 2222.5 [P.O.:50; I.V.:1172.5; IV Piggyback:1000] Out: 715 [Urine:715] Intake/Output from this shift: Total I/O In: 837 [P.O.:237; I.V.:600] Out: 140 [Urine:140]  Labs:  Recent Labs  04/18/17 0526 04/19/17 0451  WBC 10.1 10.6  HGB 11.8* 8.8*  HCT 34.6* 25.9*  PLT 201 180  CREATININE 1.19* 1.49*  MG  --  1.9  PHOS  --  3.6  ALBUMIN 3.7  --   PROT 6.7  --   AST 36  --   ALT 25  --   ALKPHOS 63  --   BILITOT 0.7  --    Estimated Creatinine Clearance: 25.7 mL/min (A) (by C-G formula based on SCr of 1.49 mg/dL (H)).   Microbiology: Recent Results (from the past 720 hour(s))  MRSA PCR Screening     Status: None   Collection Time: 04/18/17 10:43 AM  Result Value Ref Range Status   MRSA by PCR NEGATIVE NEGATIVE Final    Comment:        The GeneXpert MRSA Assay (FDA approved for NASAL specimens only), is one component of a comprehensive MRSA colonization surveillance program. It is not intended to diagnose MRSA infection nor to guide or monitor treatment for MRSA infections.     Medical History: Past Medical History:  Diagnosis Date  . Anemia   . Anxiety 10/02/2012  . Atrial fibrillation ([place]) 11/13   new onset 11/13  . BCC (basal cell carcinoma), face 04/24/2016  . Benign essential hypertension 03/13/2015  . Cellulitis of right upper extremity 01/27/2017  . Constipation 01/13/2015  . Decreased hearing 05/21/2015  . Health care maintenance 10/14/2014   Mammogram 10/01/15 - Birads I.    .  Hypercholesterolemia   . Hyperlipidemia, mixed 02/01/2017  . Hypertension   . Osteoarthritis    hands, feet  . Sick sinus syndrome ([place]) 01/04/2017   Overview:  Sp pacer placement 2018  . Syncope 12/08/2016    Assessment: Patient is 83yo female admitted for hemothorax. Patient was taking Apixaban prior to admission, last dose on 9/9. Patient has decreased renal function and is bleeding into chest.   Goal of Therapy:  Reversal of apixaban effects  Plan:  Will order KCentra 50units/kg rounded to nearest 500 units x one dose.  Paulina Fusi, PharmD, BCPS 04/19/2017 12:31 PM

## 2017-04-19 NOTE — Telephone Encounter (Signed)
Noted  

## 2017-04-19 NOTE — Procedures (Signed)
US guided thoracentesis at bedside  75 cc bloody fluid obtined Sent for labs  Tolerated well  cxr no ptx

## 2017-04-19 NOTE — OR Nursing (Signed)
2200 ml removed from Hemothorax right lung during surgery.

## 2017-04-19 NOTE — Telephone Encounter (Signed)
Called Dr. Melrose Nakayama office c/a app will call after patient has been discharged to make follow up app.

## 2017-04-19 NOTE — Telephone Encounter (Signed)
Spoke to [place] on Friday about appt.  Plans were to discuss with family over the weekend to assure someone could get her to appt, but she was admitted to the hospital over the weekend.  Please notify Dr Lannie Fields office that pt will not make her 04/19/17 appt (for 9:00) because she is currently in the hospital.  Also notify Randi.  I am not sure if they need to f/u and anything they need to do while pt is in the hospital.  Thanks

## 2017-04-19 NOTE — Progress Notes (Signed)
Pt. Taken off HFNC and placed on a 4 L nasal cannula.

## 2017-04-19 NOTE — Progress Notes (Signed)
[facility] [place] NAME: Kristin Avila    MR#:  [id number]  DATE OF BIRTH:  Jun 27, 1928  SUBJECTIVE:  CHIEF COMPLAINT:   Chief Complaint  Patient presents with  . Shoulder Pain   No shortness of breath or shoulder pain. On O2 [place]. REVIEW OF SYSTEMS:  Review of Systems  Constitutional: Negative for chills, fever and malaise/fatigue.  HENT: Negative for sore throat.   Eyes: Negative for blurred vision and double vision.  Respiratory: Negative for cough, hemoptysis, shortness of breath, wheezing and stridor.   Cardiovascular: Negative for chest pain, palpitations, orthopnea and leg swelling.  Gastrointestinal: Negative for abdominal pain, blood in stool, diarrhea, melena, nausea and vomiting.  Genitourinary: Negative for dysuria, flank pain and hematuria.  Musculoskeletal: Negative for back pain and joint pain.  Skin: Negative for rash.  Neurological: Negative for dizziness, sensory change, focal weakness, seizures, loss of consciousness, weakness and headaches.  Endo/Heme/Allergies: Negative for polydipsia.  Psychiatric/Behavioral: Negative for depression. The patient is not nervous/anxious.     DRUG ALLERGIES:  No Known Allergies VITALS:  Blood pressure 129/68, pulse 61, temperature 97.8 F (36.6 C), temperature source Oral, resp. rate (!) 26, height 5\' 2"  (1.575 m), weight 164 lb 14.5 oz (74.8 kg), SpO2 91 %. PHYSICAL EXAMINATION:  Physical Exam  Constitutional: She is oriented to person, place, and time and well-developed, well-nourished, and in no distress.  HENT:  Head: Normocephalic.  Mouth/Throat: Oropharynx is clear and moist.  Eyes: Pupils are equal, round, and reactive to light. Conjunctivae and EOM are normal. No scleral icterus.  Neck: Normal range of motion. Neck supple. No JVD present. No tracheal deviation present.  Cardiovascular: Normal rate, regular rhythm and normal heart sounds.  Exam reveals no gallop.   No  murmur heard. Pulmonary/Chest: No respiratory distress. She has no wheezes. She has no rales.  No breath sounds on the right side.  Abdominal: Soft. Bowel sounds are normal. She exhibits no distension. There is no tenderness. There is no rebound.  Musculoskeletal: Normal range of motion. She exhibits no edema or tenderness.  Neurological: She is alert and oriented to person, place, and time. No cranial nerve deficit.  Skin: No rash noted. No erythema.  Psychiatric: Affect normal.   LABORATORY PANEL:  Female CBC  Recent Labs Lab 04/19/17 0451  WBC 10.6  HGB 8.8*  HCT 25.9*  PLT 180   ------------------------------------------------------------------------------------------------------------------ Chemistries   Recent Labs Lab 04/18/17 0526 04/19/17 0451  NA 139 139  K 4.1 4.0  CL 106 108  CO2 25 23  GLUCOSE 179* 120*  BUN 31* 37*  CREATININE 1.19* 1.49*  CALCIUM 9.1 8.6*  MG  --  1.9  AST 36  --   ALT 25  --   ALKPHOS 63  --   BILITOT 0.7  --    RADIOLOGY:  Dg Chest Port 1 View  Result Date: 04/19/2017 CLINICAL DATA:  Status post right-sided thoracentesis EXAM: PORTABLE CHEST 1 VIEW COMPARISON:  April 18, 2017 FINDINGS: No pneumothorax. There is essentially complete opacification of the right hemithorax due to a combination of pleural effusion and consolidation. Left lung is clear. Heart is upper normal in size. Pulmonary vascularity on the left is normal. Pulmonary vascularity on the right is obscured. Pacemaker leads are attached to the right atrium and right ventricle. There is aortic atherosclerosis. Bones are osteoporotic. No focal bone lesions are evident. No adenopathy is appreciable in areas which can be interrogated with respect  to appearance of hilum and mediastinum. IMPRESSION: No pneumothorax. Complete opacification of right hemithorax, likely due to combination of large effusion and consolidation/compressive atelectasis. Left lung clear. Stable cardiac  silhouette. There is aortic atherosclerosis. Aortic Atherosclerosis (ICD10-I70.0). Electronically Signed   By: Lowella Grip III M.D.   On: 04/19/2017 11:38   US Thoracentesis Asp Pleural Space W/img Guide  Result Date: 04/19/2017 INDICATION: Symptomatic right-sided pleural effusion PROCEDURE: ULTRASOUND GUIDED right THORACENTESIS COMPARISON:  None. MEDICATIONS: 10 cc 1% lidocaine. COMPLICATIONS: None immediate. TECHNIQUE: Informed written consent was obtained from the patient after a discussion of the risks, benefits and alternatives to treatment. A timeout was performed prior to the initiation of the procedure. Initial ultrasound scanning demonstrates a right pleural effusion. The lower chest was prepped and draped in the usual sterile fashion. 1% lidocaine was used for local anesthesia. Under direct ultrasound guidance, a 19 gauge, 7-cm, Yueh catheter was introduced. An ultrasound image was saved for documentation purposes. The thoracentesis was performed. The catheter was removed and a dressing was applied. The patient tolerated the procedure well without immediate post procedural complication. The patient was escorted to have an upright chest radiograph. FINDINGS: A total of approximately 75 cc of thick bloody fluid was removed. Requested samples were sent to the laboratory. IMPRESSION: Successful ultrasound-guided right sided thoracentesis yielding 75 cc of thick bloody pleural fluid. : Read by Lavonia Drafts [facility] Electronically Signed   By: Inez Catalina M.D.   On: 04/19/2017 11:54   ASSESSMENT AND PLAN:   Right large hemothorax, US guided thoracentesis: bloody fluid. likely has a tension hemothorax, urgent thoracoscopy possible thoracotomy for evacuation of her hemothorax per Dr. Genevive Bi. Hold Eliquis.  Acute respiratory failure with hypoxia due to above. The patient is on high flow oxygen.  Anemia due to acute blood loss due to hemothorax. Hb down from 11.8 to 8.8. Per Dr. Genevive Bi, administer  concentrated factor X and type and cross the patient for 4 units of blood.  Hypotension. Blood pressure is better after NS bolus.  Hold all hypertension medication for now.   ARF due to hypotension and Dehydration.  Continue fluid support and follow-up BMP. Hold HCTZ.  Elevated troponin. Possible due to demanding ischemia. Follow-up troponin level, hold Eliquis.   A. Fib RVR and sick sinus syndrome, status post pacemaker.  Hold Eliquis and Lopressor due to Hemathorax and hypotension.  Osteoarthritis. I discussed with Dr. Shawna Orleans and Dr. Genevive Bi All the records are reviewed and case discussed with Care Management/Social Worker. Management plans discussed with the patient, son-in-law and they are in agreement.  CODE STATUS: Full Code  TOTAL TIME TAKING CARE OF THIS PATIENT: 42 minutes.   More than 50% of the time was spent in counseling/coordination of care: YES  POSSIBLE D/C IN ? DAYS, DEPENDING ON CLINICAL CONDITION.   Demetrios Loll M.D on 04/19/2017 at 1:51 PM  Between 7am to 6pm - Pager - [phone]  After 6pm go to [email or web address] - [facility]

## 2017-04-19 NOTE — Anesthesia Preprocedure Evaluation (Addendum)
Anesthesia Evaluation  Patient identified by MRN, date of birth, ID band Patient awake    Reviewed: Allergy & Precautions, NPO status , Patient's Chart, lab work & pertinent test results, reviewed documented beta blocker date and time   Airway Mallampati: III  TM Distance: >3 FB     Dental  (+) Chipped, Poor Dentition, Dental Advisory Given, Missing   Pulmonary           Cardiovascular hypertension, Pt. on medications + dysrhythmias Atrial Fibrillation + pacemaker      Neuro/Psych Anxiety    GI/Hepatic   Endo/Other    Renal/GU      Musculoskeletal  (+) Arthritis ,   Abdominal   Peds  Hematology  (+) anemia ,   Anesthesia Other Findings Obese. Dual pace. Hb 8.8. Saturation 92%. R lung white out. EF adequate. Overbite.  Reproductive/Obstetrics                         Anesthesia Physical Anesthesia Plan  ASA: IV  Anesthesia Plan: General   Post-op Pain Management:    Induction: Intravenous  PONV Risk Score and Plan:   Airway Management Planned: Double Lumen EBT  Additional Equipment:   Intra-op Plan:   Post-operative Plan:   Informed Consent: I have reviewed the patients History and Physical, chart, labs and discussed the procedure including the risks, benefits and alternatives for the proposed anesthesia with the patient or authorized representative who has indicated his/her understanding and acceptance.     Plan Discussed with: CRNA  Anesthesia Plan Comments:         Anesthesia Quick Evaluation

## 2017-04-19 NOTE — Progress Notes (Signed)
Lab redrawing hgb to confirm readings. Patient medicated for leg pain. She stated her legs hurt from laying in the bed. Patient still refused SCDs. Patient has full ROM which she has been moving all night. Medicated with 1 tab of Norco and now HR is in the 60s.

## 2017-04-19 NOTE — Anesthesia Procedure Notes (Signed)
Procedure Name: Intubation Performed by: Lance Muss Pre-anesthesia Checklist: Patient identified, Patient being monitored, Timeout performed, Emergency Drugs available and Suction available Patient Re-evaluated:Patient Re-evaluated prior to induction Oxygen Delivery Method: Circle system utilized Preoxygenation: Pre-oxygenation with 100% oxygen Induction Type: IV induction Ventilation: Mask ventilation without difficulty Laryngoscope Size: 3 and McGraph Grade View: Grade II Tube type: Oral Endobronchial tube: Left and Double lumen EBT and 35 Fr Number of attempts: 1 Airway Equipment and Method: Stylet Placement Confirmation: ETT inserted through vocal cords under direct vision,  positive ETCO2 and breath sounds checked- equal and bilateral Tube secured with: Tape Dental Injury: Teeth and Oropharynx as per pre-operative assessment  Difficulty Due To: Difficult Airway- due to anterior larynx and Difficult Airway- due to dentition Future Recommendations: Recommend- induction with short-acting agent, and alternative techniques readily available

## 2017-04-20 ENCOUNTER — Inpatient Hospital Stay: Payer: Medicare Other

## 2017-04-20 ENCOUNTER — Encounter: Payer: Self-pay | Admitting: Cardiothoracic Surgery

## 2017-04-20 LAB — ACID FAST SMEAR (AFB): ACID FAST SMEAR - AFSCU2: NEGATIVE

## 2017-04-20 LAB — PH, BODY FLUID: pH, Body Fluid: 7.2

## 2017-04-20 LAB — TRIGLYCERIDES, BODY FLUIDS: Triglycerides, Fluid: 58 mg/dL

## 2017-04-20 LAB — CYTOLOGY - NON PAP

## 2017-04-20 LAB — PREPARE RBC (CROSSMATCH)

## 2017-04-20 LAB — ACID FAST SMEAR (AFB, MYCOBACTERIA)

## 2017-04-20 MED ORDER — LACTATED RINGERS IV SOLN
INTRAVENOUS | Status: DC
  Administered 2017-04-20 – 2017-04-23 (×4): via INTRAVENOUS

## 2017-04-20 MED ORDER — AMIODARONE HCL IN DEXTROSE 360-4.14 MG/200ML-% IV SOLN
60.0000 mg/h | INTRAVENOUS | Status: AC
Start: 2017-04-20 — End: 2017-04-20
  Administered 2017-04-20 (×2): 60 mg/h via INTRAVENOUS
  Filled 2017-04-20: qty 200

## 2017-04-20 MED ORDER — TRAZODONE HCL 50 MG PO TABS
50.0000 mg | ORAL_TABLET | Freq: Every evening | ORAL | Status: DC | PRN
  Administered 2017-04-20 – 2017-04-27 (×7): 50 mg via ORAL
  Filled 2017-04-20 (×8): qty 1

## 2017-04-20 MED ORDER — AMIODARONE HCL IN DEXTROSE 360-4.14 MG/200ML-% IV SOLN
INTRAVENOUS | Status: AC
  Administered 2017-04-20: 150 mg via INTRAVENOUS
  Filled 2017-04-20: qty 200

## 2017-04-20 MED ORDER — AMIODARONE HCL IN DEXTROSE 360-4.14 MG/200ML-% IV SOLN
30.0000 mg/h | INTRAVENOUS | Status: DC
  Administered 2017-04-20 – 2017-04-22 (×4): 30 mg/h via INTRAVENOUS
  Filled 2017-04-20 (×3): qty 200

## 2017-04-20 MED ORDER — FLUOXETINE HCL 20 MG PO CAPS
40.0000 mg | ORAL_CAPSULE | Freq: Every day | ORAL | Status: DC
  Administered 2017-04-20 – 2017-04-28 (×9): 40 mg via ORAL
  Filled 2017-04-20 (×9): qty 2

## 2017-04-20 MED ORDER — MORPHINE SULFATE (PF) 4 MG/ML IV SOLN
1.0000 mg | INTRAVENOUS | Status: DC | PRN
  Administered 2017-04-20: 1 mg via INTRAVENOUS
  Administered 2017-04-20 – 2017-04-23 (×2): 2 mg via INTRAVENOUS
  Administered 2017-04-23: 1 mg via INTRAVENOUS
  Filled 2017-04-20 (×4): qty 1

## 2017-04-20 MED ORDER — AMIODARONE LOAD VIA INFUSION
150.0000 mg | Freq: Once | INTRAVENOUS | Status: AC
  Administered 2017-04-20: 150 mg via INTRAVENOUS
  Filled 2017-04-20: qty 83.34

## 2017-04-20 NOTE — Progress Notes (Signed)
Dr Alva Garnet notified of change in hear rate/rhythm to afib, pacer is not spiking.  He will look at patient when he arrives to unit

## 2017-04-20 NOTE — Progress Notes (Signed)
Patient resting in bed at this time. At the beginning of the shift patient was still very lethargic from her procedure and was alert to self and place. By midnight patient was alert and oriented x4. Chest tubes draining moderate amounts of serosanguinous fluid. Foley draining clear, yellow urine. Patient remains on  at 4L. Arterial line wave form appropriate and matching closely to automatic blood pressure cuff. Patient unable to get comfortable due to discomfort of chest tubes. Medicated x2 with morphine. Morphine was effective for relief of pain. Daughter called in to speak with me for an update.

## 2017-04-20 NOTE — Progress Notes (Signed)
Awake and alert No complaints.  Not short of breath.  No pain.  No air leak CT draining serous fluid - about 200 cc overnight Good air movement on the right but still less than left  Will check CXRay Keep chest tubes to suction No anticoagulation for today  [employer].

## 2017-04-20 NOTE — Evaluation (Signed)
Physical Therapy Evaluation Patient Details Name: Kristin Avila MRN: [id number] DOB: 1927/11/19 Today's Date: 04/20/2017   History of Present Illness  Pt admitted for complaints of chest pain and shoulder pain. Pt is now s/p R thoracoscopy and evacuation of R hemothorax. Pt history includes anxiety, HTN, and Afib. Pt with hip pain this date, however imaging negative at this time. Pt now has 3 chest tubes presents.  Clinical Impression  Pt is a pleasant 83 year old female who was admitted for chest pain and shoulder pain. Noted to have blood build up in lungs and is now s/p R thoracoscopy and evacuation of R lung on 04/19/17. Pt performs rolling in bed with min assist and HR increases to 140s and 150s rating 9/10 on RPE. Pt unable to further tolerate mobility attempts at this time. Pt demonstrates deficits with strength/endurance/mobility. Would benefit from skilled PT to address above deficits and promote optimal return to PLOF; recommend transition to STR upon discharge from acute hospitalization.       Follow Up Recommendations SNF    Equipment Recommendations  Rolling walker with 5" wheels    Recommendations for Other Services       Precautions / Restrictions Precautions Precautions: Fall Restrictions Weight Bearing Restrictions: No      Mobility  Bed Mobility Overal bed mobility: Needs Assistance Bed Mobility: Rolling Rolling: Mod assist         General bed mobility comments: attempted rolling in bed towards L side. Very effortful movement with HR increasing to 140s and 150s with exertion. Pt rates at 9/10 RPE. Further mobility deferred this date  Transfers                 General transfer comment: unable to perform  Ambulation/Gait             General Gait Details: unable to perform  Stairs            Wheelchair Mobility    Modified Rankin (Stroke Patients Only)       Balance                                              Pertinent Vitals/Pain Pain Assessment: No/denies pain    Home Living Family/patient expects to be discharged to:: Private residence Living Arrangements:  (grandchildren) Available Help at Discharge: Family;Available PRN/intermittently (grandchildren work) Type of Home: House Home Access: Stairs to enter Entrance Stairs-Rails: None Technical brewer of Steps: 1 Home Layout: One level Home Equipment: None      Prior Function Level of Independence: Independent         Comments: Pt ambulates without AD.  She reports one fall last week at [facility] when she tripped over something.      Hand Dominance        Extremity/Trunk Assessment   Upper Extremity Assessment Upper Extremity Assessment: Generalized weakness (B UE grossly 3+/5 and guarding of L shoulder)    Lower Extremity Assessment Lower Extremity Assessment: Generalized weakness (B LE grossly 3+/5)       Communication   Communication: HOH  Cognition Arousal/Alertness: Awake/alert Behavior During Therapy: WFL for tasks assessed/performed Overall Cognitive Status: Within Functional Limits for tasks assessed  General Comments      Exercises Other Exercises Other Exercises: Supine ther-ex performed including B LE ankle pumps, SLRs, and quad sets. All ther-ex performed x 10 reps with min assist. Educated pt and daughter on frequency and duration to improve strength.   Assessment/Plan    PT Assessment Patient needs continued PT services  PT Problem List Decreased strength;Decreased activity tolerance;Decreased mobility;Cardiopulmonary status limiting activity       PT Treatment Interventions Gait training;DME instruction;Therapeutic activities;Therapeutic exercise    PT Goals (Current goals can be found in the Care Plan section)  Acute Rehab PT Goals Patient Stated Goal: to get stronger PT Goal Formulation: With patient Time For Goal  Achievement: 05/04/17 Potential to Achieve Goals: Good    Frequency Min 2X/week   Barriers to discharge        Co-evaluation               AM-PAC PT "6 Clicks" Daily Activity  Outcome Measure Difficulty turning over in bed (including adjusting bedclothes, sheets and blankets)?: Unable Difficulty moving from lying on back to sitting on the side of the bed? : Unable Difficulty sitting down on and standing up from a chair with arms (e.g., wheelchair, bedside commode, etc,.)?: Unable Help needed moving to and from a bed to chair (including a wheelchair)?: Total Help needed walking in hospital room?: Total Help needed climbing 3-5 steps with a railing? : Total 6 Click Score: 6    End of Session   Activity Tolerance: Patient limited by fatigue;Treatment limited secondary to medical complications (Comment) Patient left: in bed;with family/visitor present Nurse Communication: Mobility status PT Visit Diagnosis: Muscle weakness (generalized) (M62.81);History of falling (Z91.81);Difficulty in walking, not elsewhere classified (R26.2)    Time: 0254-2706 PT Time Calculation (min) (ACUTE ONLY): 17 min   Charges:   PT Evaluation $PT Eval High Complexity: 1 High PT Treatments $Therapeutic Exercise: 8-22 mins   PT G Codes:   PT G-Codes **NOT FOR INPATIENT CLASS** Functional Assessment Tool Used: AM-PAC 6 Clicks Basic Mobility Functional Limitation: Mobility: Walking and moving around Mobility: Walking and Moving Around Current Status (C3762): 100 percent impaired, limited or restricted Mobility: Walking and Moving Around Goal Status (G3151): At least 80 percent but less than 100 percent impaired, limited or restricted    Greggory Stallion, PT, DPT [phone]   Kristin Avila 04/20/2017, 5:25 PM

## 2017-04-20 NOTE — Progress Notes (Signed)
Pt notes pain in left hip.  O/e there is pin point tenderness in anterior hip/femoral area.   Will check hip xray.   Marda Stalker, MD.   Board Certified in Internal Medicine, Pulmonary Medicine, [place], and Sleep Medicine.  [facility] Office Number: [phone] Pager: [phone]  Patricia Pesa, M.D.  Merton Border, M.D 04/20/2017

## 2017-04-20 NOTE — Clinical Social Work Note (Signed)
CSW received call from [facility], [place], late yesterday afternoon stating that they were following patient. CSW has left message for [employer] APS representative: Nadara Mustard: [phone], today to discuss further. Shela Leff MSW,LCSW [phone]

## 2017-04-20 NOTE — Progress Notes (Signed)
PT Cancellation Note  Patient Details Name: Kristin Avila MRN: [id number] DOB: 04-Sep-1927   Cancelled Treatment:    Reason Eval/Treat Not Completed: Other (comment). Consult received and chart reviewed. Pt is s/p Sx. Called RN to ask for new orders if pt appropriate for therapy this date. Will cancel current orders. Please re-order if necessary.   Nephtali Docken 04/20/2017, 10:51 AM  Greggory Stallion, PT, DPT [phone]

## 2017-04-20 NOTE — Progress Notes (Addendum)
[place] [facility] [place] NAME: Kristin Avila    MR#:  [id number]  DATE OF BIRTH:  07/19/1928  SUBJECTIVE:  CHIEF COMPLAINT:   Chief Complaint  Patient presents with  . Shoulder Pain   No shortness of breath or shoulder pain. Off O2 [place]. Left groin area pain. REVIEW OF SYSTEMS:  Review of Systems  Constitutional: Negative for chills, fever and malaise/fatigue.  HENT: Negative for sore throat.   Eyes: Negative for blurred vision and double vision.  Respiratory: Negative for cough, hemoptysis, shortness of breath, wheezing and stridor.   Cardiovascular: Negative for chest pain, palpitations, orthopnea and leg swelling.  Gastrointestinal: Negative for abdominal pain, blood in stool, diarrhea, melena, nausea and vomiting.  Genitourinary: Negative for dysuria, flank pain and hematuria.  Musculoskeletal: Negative for back pain and joint pain.  Skin: Negative for rash.  Neurological: Negative for dizziness, sensory change, focal weakness, seizures, loss of consciousness, weakness and headaches.  Endo/Heme/Allergies: Negative for polydipsia.  Psychiatric/Behavioral: Negative for depression. The patient is not nervous/anxious.     DRUG ALLERGIES:  No Known Allergies VITALS:  Blood pressure (!) 104/54, pulse 70, temperature 98.3 F (36.8 C), temperature source Axillary, resp. rate 16, height 5\' 2"  (1.575 m), weight 164 lb 14.5 oz (74.8 kg), SpO2 90 %. PHYSICAL EXAMINATION:  Physical Exam  Constitutional: She is oriented to person, place, and time and well-developed, well-nourished, and in no distress.  HENT:  Head: Normocephalic.  Mouth/Throat: Oropharynx is clear and moist.  Eyes: Pupils are equal, round, and reactive to light. Conjunctivae and EOM are normal. No scleral icterus.  Neck: Normal range of motion. Neck supple. No JVD present. No tracheal deviation present.  Cardiovascular: Normal rate, regular rhythm and normal heart sounds.   Exam reveals no gallop.   No murmur heard. Pulmonary/Chest: Effort normal and breath sounds normal. No respiratory distress. She has no wheezes. She has no rales.  Chest tube in situ on the left side with bloody drainage  Abdominal: Soft. Bowel sounds are normal. She exhibits no distension. There is no tenderness. There is no rebound.  Musculoskeletal: Normal range of motion. She exhibits no edema or tenderness.  Neurological: She is alert and oriented to person, place, and time. No cranial nerve deficit.  Skin: No rash noted. No erythema.  Psychiatric: Affect normal.   LABORATORY PANEL:  Female CBC  Recent Labs Lab 04/19/17 1854  WBC 13.5*  HGB 8.0*  HCT 24.0*  PLT 165   ------------------------------------------------------------------------------------------------------------------ Chemistries   Recent Labs Lab 04/18/17 0526 04/19/17 0451 04/19/17 1854  NA 139 139 139  K 4.1 4.0 4.1  CL 106 108 110  CO2 25 23 23   GLUCOSE 179* 120* 141*  BUN 31* 37* 31*  CREATININE 1.19* 1.49* 1.07*  CALCIUM 9.1 8.6* 8.3*  MG  --  1.9  --   AST 36  --   --   ALT 25  --   --   ALKPHOS 63  --   --   BILITOT 0.7  --   --    RADIOLOGY:  Dg Chest Port 1 View  Result Date: 04/20/2017 CLINICAL DATA:  Postop VATS. EXAM: PORTABLE CHEST 1 VIEW COMPARISON:  04/19/2017 FINDINGS: Right chest tubes remain in place. The more a PICC Lee located chest tube on the prior study appears slightly more inferiorly and medially located on the current examination. The cardiomediastinal silhouette is unchanged. Aortic atherosclerosis and a pacemaker are again noted. There is slightly  increased opacity in the right lung base, likely atelectasis. There is likely a trace right pleural effusion. No pneumothorax is identified. There is at most minimal atelectasis in the left lung base. IMPRESSION: 1. Right chest tubes as above.  No pneumothorax. 2. Minimal right basilar atelectasis and likely trace pleural effusion.  Electronically Signed   By: Logan Bores M.D.   On: 04/20/2017 09:58   Dg Chest Port 1 View  Result Date: 04/19/2017 CLINICAL DATA:  Respiratory failure, postop EXAM: PORTABLE CHEST 1 VIEW COMPARISON:  04/19/2017 FINDINGS: Right chest tubes are in place. No pneumothorax. Re- aeration of the right lung. Heart is borderline in size. Pacer is unchanged. No confluent opacity currently. IMPRESSION: Placement of right chest tubes without pneumothorax. Re-expansion and clearance of the right lung. Electronically Signed   By: Rolm Baptise M.D.   On: 04/19/2017 17:27   Dg Hip Unilat With Pelvis 2-3 Views Left  Result Date: 04/20/2017 CLINICAL DATA:  Left hip anterior hip pain. EXAM: DG HIP (WITH OR WITHOUT PELVIS) 2-3V LEFT COMPARISON:  None. FINDINGS: An AP view the pelvis with AP and frogleg views of the left hip are provided. There is lower lumbar degenerative disc disease at L4-5 with associated facet arthropathy. The bony pelvis appears intact with mild osteoarthritic sclerosis of both SI joints along their synovial portion. Moderate size stool ball is seen in the mid pelvis. There is axial joint space narrowing of both hips mild-to-moderate in degree. No acute fracture or suspicious osseous lesions. Vascular calcifications are identified along the course of the left femoral artery. Injection granulomata are noted about the left buttock. IMPRESSION: 1. Mild axial degenerative joint space narrowing of both hips. 2. No acute osseous abnormality of the pelvis and hips. 3. Osteoarthritis of the SI joints. 4. Lower lumbar degenerative disc disease. Electronically Signed   By: Ashley Royalty M.D.   On: 04/20/2017 14:31   ASSESSMENT AND PLAN:   Right large hemothorax, US guided thoracentesis: bloody fluid. likely has a tension hemothorax, urgent thoracoscopy possible thoracotomy for evacuation of her hemothorax per Dr. Genevive Bi. Hold Eliquis. Status post chest tube placement. Chest x-ray today showed Minimal right  basilar atelectasis and likely trace pleural effusion. No pneumothorax.  Acute respiratory failure with hypoxia due to above. Improved. Off oxygen.  Anemia due to acute blood loss due to hemothorax. Hb down from 11.8 to 8.8, but stable at 8.0. She did not get PRBC transfusion.  Hypotension. Improved.  ARF due to hypotension and Dehydration.  Improved with fluid support. Hold HCTZ.  Elevated troponin. Possible due to demanding ischemia. hold Eliquis.   A. Fib RVR and sick sinus syndrome, status post pacemaker.  Hold Eliquis and Lopressor due to Hemothorax and hypotension. On amiodarone drip.  Osteoarthritis. Hip x-ray show Mild axial degenerative joint space narrowing of both hips.Osteoarthritis of the SI joints. Uncontrolled.  I discussed with Dr. Felicie Morn. All the records are reviewed and case discussed with Care Management/Social Worker. Management plans discussed with the patient, son and they are in agreement.  CODE STATUS: Full Code  TOTAL TIME TAKING CARE OF THIS PATIENT: 37 minutes.   More than 50% of the time was spent in counseling/coordination of care: YES  POSSIBLE D/C IN 3 DAYS, DEPENDING ON CLINICAL CONDITION.   Demetrios Loll M.D on 04/20/2017 at 5:05 PM  Between 7am to 6pm - Pager - [phone]  After 6pm go to [email or web address] - [facility]
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
## 2017-04-20 NOTE — Progress Notes (Signed)
[facility] Progress Note Patient Name: [name] DOB: Jun 09, 1928 MRN: [id number]   Date of Service  04/20/2017  HPI/Events of Note  afib with RVR  eICU Interventions  Started amiodarone     Intervention Category Evaluation Type: Other  Flora Lipps 04/20/2017, 4:37 PM

## 2017-04-20 NOTE — Progress Notes (Signed)
Dr Alva Garnet notified of new onset right anterior thigh pain, pt has been repositioned and 2 mg morphine given with minimal results.  He will see patient when he returns to the floor

## 2017-04-20 NOTE — Progress Notes (Signed)
[facility] notified of AFib w/ RVR, orders to [facility]ow

## 2017-04-20 NOTE — Anesthesia Postprocedure Evaluation (Signed)
Anesthesia Post Note  Patient: Kristin Avila  Procedure(s) Performed: Procedure(s) (LRB): RIGHT THORACOSCOPY AND EVACUATION OF HEMOTHORAX (Right)  Patient location during evaluation: ICU Anesthesia Type: General Level of consciousness: lethargic Pain management: pain level controlled Vital Signs Assessment: post-procedure vital signs reviewed and stable Respiratory status: spontaneous breathing and patient connected to nasal cannula oxygen Cardiovascular status: blood pressure returned to baseline and stable Postop Assessment: no signs of nausea or vomiting Anesthetic complications: no Comments: Pt has pacer. Nurse noted intermittent spikes on ECG, whereas yesterday continuous spikes. Nurse will be calling hospitalist about this     Last Vitals:  Vitals:   04/20/17 0500 04/20/17 0600  BP: (!) 108/47 (!) 111/50  Pulse: 70 70  Resp: 14 15  Temp:    SpO2: 100% 100%    Last Pain:  Vitals:   04/20/17 0419  TempSrc:   PainSc: Yolanda Manges

## 2017-04-20 NOTE — Progress Notes (Signed)
Per Dr Mortimer Fries insert foley cath for inability to urinate

## 2017-04-21 ENCOUNTER — Encounter: Payer: Self-pay | Admitting: *Deleted

## 2017-04-21 LAB — BPAM RBC
BLOOD PRODUCT EXPIRATION DATE: 201810052359
BLOOD PRODUCT EXPIRATION DATE: 201810052359
Blood Product Expiration Date: 201810142359
Blood Product Expiration Date: 201810142359
ISSUE DATE / TIME: 201809101400
ISSUE DATE / TIME: 201809101400
UNIT TYPE AND RH: 5100
Unit Type and Rh: 5100
Unit Type and Rh: 5100
Unit Type and Rh: 5100

## 2017-04-21 LAB — TYPE AND SCREEN
ABO/RH(D): O POS
Antibody Screen: NEGATIVE
UNIT DIVISION: 0
UNIT DIVISION: 0
UNIT DIVISION: 0
Unit division: 0

## 2017-04-21 LAB — CHOLESTEROL, BODY FLUID: CHOL FL: 141 mg/dL

## 2017-04-21 LAB — BASIC METABOLIC PANEL
Anion gap: 5 (ref 5–15)
BUN: 14 mg/dL (ref 6–20)
CHLORIDE: 108 mmol/L (ref 101–111)
CO2: 27 mmol/L (ref 22–32)
CREATININE: 0.64 mg/dL (ref 0.44–1.00)
Calcium: 8.4 mg/dL — ABNORMAL LOW (ref 8.9–10.3)
Glucose, Bld: 94 mg/dL (ref 65–99)
Potassium: 3.9 mmol/L (ref 3.5–5.1)
SODIUM: 140 mmol/L (ref 135–145)

## 2017-04-21 LAB — SURGICAL PATHOLOGY

## 2017-04-21 LAB — HEMOGLOBIN: HEMOGLOBIN: 7.8 g/dL — AB (ref 12.0–16.0)

## 2017-04-21 MED ORDER — METOPROLOL TARTRATE 25 MG PO TABS
25.0000 mg | ORAL_TABLET | Freq: Three times a day (TID) | ORAL | Status: DC
  Administered 2017-04-21 – 2017-04-22 (×3): 25 mg via ORAL
  Filled 2017-04-21 (×3): qty 1

## 2017-04-21 NOTE — Progress Notes (Signed)
Pt requesting something for sleep, pt takes trazodone 100mg  at home. Pt received trazodone once while here. But it is no longer ordered, MD paged, Dr. Jannifer Franklin to put in orders for PRN trazodone. Will give & continue to monitor.

## 2017-04-21 NOTE — NC FL2 (Signed)
[place] LEVEL OF CARE SCREENING TOOL     IDENTIFICATION  Patient Name: Kristin Avila Birthdate: [birth year] Sex: female Admission Date (Current Location): 04/18/2017  [place] and [place] Number:  Engineering geologist and Address:  [facility], [address], [place], [place] [address]      Provider Number: [id number]  Attending Physician Name and Address:  Demetrios Loll, MD  Relative Name and Phone Number:  Denicola,Carol Daughter [phone]  [phone] or Ziyon, [place] [phone] or Aften, [place] [phone]     Current Level of Care: Hospital Recommended Level of Care: [place] Prior Approval Number:    Date Approved/Denied:   PASRR Number: [id number]  Discharge Plan: SNF    Current Diagnoses: Patient Active Problem List   Diagnosis Date Noted  . Pleural effusion on right 04/18/2017  . Hypotension 04/18/2017  . Hyperlipidemia, mixed 02/01/2017  . Cellulitis of right upper extremity 01/27/2017  . Sick sinus syndrome ([place]) 01/04/2017  . Syncope 12/08/2016  . BCC (basal cell carcinoma), face 04/24/2016  . Decreased hearing 05/21/2015  . Benign essential hypertension 03/13/2015  . Constipation 01/13/2015  . Health care maintenance 10/14/2014  . Anxiety 10/02/2012  . Atrial fibrillation ([place]) 07/09/2012  . Osteoarthritis 07/08/2012  . Hypercholesterolemia 07/08/2012  . Hypertension 07/08/2012    Orientation RESPIRATION BLADDER Height & Weight     Self, Time, Situation, Place  Normal Continent Weight: 170 lb 6.4 oz (77.3 kg) Height:  5\' 2"  (157.5 cm)  BEHAVIORAL SYMPTOMS/MOOD NEUROLOGICAL BOWEL NUTRITION STATUS      Continent Diet (Cardiac)  AMBULATORY STATUS COMMUNICATION OF NEEDS Skin   Limited Assist Verbally Surgical wounds                       Personal Care Assistance Level of Assistance  Bathing, Feeding, Dressing, Total care Bathing Assistance: Limited assistance Feeding  assistance: Limited assistance Dressing Assistance: Limited assistance Total Care Assistance: Limited assistance   Functional Limitations Info  Sight, Hearing, Speech Sight Info: Adequate Hearing Info: Impaired Speech Info: Adequate    SPECIAL CARE FACTORS FREQUENCY  PT (By licensed PT), OT (By licensed OT)     PT Frequency: 5x a week OT Frequency: 5x a week            Contractures Contractures Info: Not present    Additional Factors Info  Code Status, Allergies, Psychotropic Code Status Info: Full Code Allergies Info: NKA Psychotropic Info: FLUoxetine (PROZAC) capsule 40 mg         Current Medications (04/21/2017):  This is the current hospital active medication list Current Facility-Administered Medications  Medication Dose Route Frequency Provider Last Rate Last Dose  . albuterol (PROVENTIL) (2.5 MG/3ML) 0.083% nebulizer solution 2.5 mg  2.5 mg Nebulization Q2H PRN Demetrios Loll, MD      . amiodarone (NEXTERONE PREMIX) 360-4.14 MG/200ML-% (1.8 mg/mL) IV infusion  30 mg/hr Intravenous Continuous Flora Lipps, MD 16.7 mL/hr at 04/21/17 1630 30 mg/hr at 04/21/17 1630  . bisacodyl (DULCOLAX) EC tablet 5 mg  5 mg Oral Daily PRN Demetrios Loll, MD      . FLUoxetine (PROZAC) capsule 40 mg  40 mg Oral Daily Nestor Lewandowsky, MD   40 mg at 04/21/17 0952  . HYDROcodone-acetaminophen (NORCO/VICODIN) 5-325 MG per tablet 1-2 tablet  1-2 tablet Oral Q4H PRN Demetrios Loll, MD   2 tablet at 04/21/17 1118  . lactated ringers infusion   Intravenous Continuous Wilhelmina Mcardle, MD 50 mL/hr at 04/21/17  8115    . MEDLINE mouth rinse  15 mL Mouth Rinse BID Demetrios Loll, MD   15 mL at 04/20/17 2300  . metoprolol tartrate (LOPRESSOR) tablet 25 mg  25 mg Oral Q8H Teodoro Spray, MD   25 mg at 04/21/17 0952  . morphine 4 MG/ML injection 1-2 mg  1-2 mg Intravenous Q3H PRN Wilhelmina Mcardle, MD   1 mg at 04/20/17 1930  . ondansetron (ZOFRAN) injection 4 mg  4 mg Intravenous Q6H PRN Demetrios Loll, MD      .  pneumococcal 23 valent vaccine (PNU-IMMUNE) injection 0.5 mL  0.5 mL Intramuscular Tomorrow-1000 Demetrios Loll, MD      . senna-docusate (Senokot-S) tablet 1 tablet  1 tablet Oral QHS PRN Demetrios Loll, MD      . traZODone (DESYREL) tablet 50 mg  50 mg Oral QHS PRN Lance Coon, MD   50 mg at 04/20/17 2343     Discharge Medications: Please see discharge summary for a list of discharge medications.  Relevant Imaging Results:  Relevant Lab Results:   Additional Information SSN [id number]   Ross Ludwig, [place]

## 2017-04-21 NOTE — Progress Notes (Signed)
Physical Therapy Treatment Patient Details Name: Kristin Avila MRN: [id number] DOB: November 20, 1927 Today's Date: 04/21/2017    History of Present Illness Pt admitted for complaints of chest pain and shoulder pain. Pt is now s/p R thoracoscopy and evacuation of R hemothorax. Pt history includes anxiety, HTN, and Afib. Pt with hip pain this date, however imaging negative at this time. Pt now has 3 chest tubes presents. Pt with HR WNL this date. Chest tube to water seal.    PT Comments    Pt is making gradual progress towards goals with ability to progress functional mobility. Pt able to sit at EOB and stand with RW, however fatigues quickly. IV started leaking once standing, RN called and came in room to assess. Not appropriate for chair transfer at this time as endurance is poor. Eager to perform there-ex, with safe technique. Will continue to progress.   Follow Up Recommendations  SNF     Equipment Recommendations  Rolling walker with 5" wheels    Recommendations for Other Services       Precautions / Restrictions Precautions Precautions: Fall Restrictions Weight Bearing Restrictions: No    Mobility  Bed Mobility Overal bed mobility: Needs Assistance Bed Mobility: Supine to Sit     Supine to sit: Mod assist     General bed mobility comments: able to perform supine->sit with mod assist and heavy cues for sequencing and participation. Once seated, able to maintain sitting at EOB for approx 5-8 min, however fatigues quickly.  Transfers Overall transfer level: Needs assistance Equipment used: Rolling walker (2 wheeled) Transfers: Sit to/from Stand Sit to Stand: Mod assist;+2 physical assistance         General transfer comment: able to stand with RW and needs B feet blocked prior to transfer. Once standing, uses RW for upright posture. Pt only able to stand for approx 1 minute prior to fatigue.  Ambulation/Gait Ambulation/Gait assistance: Min assist;+2 physical  assistance Ambulation Distance (Feet): 1 Feet Assistive device: Rolling walker (2 wheeled) Gait Pattern/deviations: Step-to pattern     General Gait Details: able to take 1 side step towards [facility] for improved placement in bed. Fatigues quickly, unable to ambulate further distance. Uses RW   Stairs            Wheelchair Mobility    Modified Rankin (Stroke Patients Only)       Balance                                            Cognition Arousal/Alertness: Lethargic Behavior During Therapy: WFL for tasks assessed/performed Overall Cognitive Status: Within Functional Limits for tasks assessed                                        Exercises Other Exercises Other Exercises: supine ther-ex performed on B LE including ankle pumps, SLRs, quad sets, SAQ, and hip abd/add. ALl ther-ex performed x 10 reps with min assist. CUes for correct technique    General Comments        Pertinent Vitals/Pain Pain Assessment: No/denies pain    Home Living                      Prior Function  PT Goals (current goals can now be found in the care plan section) Acute Rehab PT Goals Patient Stated Goal: to get stronger PT Goal Formulation: With patient Time For Goal Achievement: 05/04/17 Potential to Achieve Goals: Good Progress towards PT goals: Progressing toward goals    Frequency    Min 2X/week      PT Plan Current plan remains appropriate    Co-evaluation              AM-PAC PT "6 Clicks" Daily Activity  Outcome Measure  Difficulty turning over in bed (including adjusting bedclothes, sheets and blankets)?: Unable Difficulty moving from lying on back to sitting on the side of the bed? : Unable Difficulty sitting down on and standing up from a chair with arms (e.g., wheelchair, bedside commode, etc,.)?: Unable Help needed moving to and from a bed to chair (including a wheelchair)?: Total Help needed walking  in hospital room?: Total Help needed climbing 3-5 steps with a railing? : Total 6 Click Score: 6    End of Session   Activity Tolerance: Patient limited by fatigue Patient left: in bed;with bed alarm set;with family/visitor present Nurse Communication: Mobility status PT Visit Diagnosis: Muscle weakness (generalized) (M62.81);History of falling (Z91.81);Difficulty in walking, not elsewhere classified (R26.2)     Time: 9379-0240 PT Time Calculation (min) (ACUTE ONLY): 28 min  Charges:  $Therapeutic Exercise: 8-22 mins $Therapeutic Activity: 8-22 mins                    G Codes:       Greggory Stallion, PT, DPT [phone]    Kristin Avila 04/21/2017, 5:30 PM

## 2017-04-21 NOTE — Telephone Encounter (Signed)
2nd attempt to reach pt. No answer, No VM.  Note** No hx of AWV

## 2017-04-21 NOTE — Progress Notes (Signed)
She has no complaints today. She states that her breathing is good.   Her chest tube drained about 250 cc of serous fluid. I changed all her wounds today. They are clean dry and without drainage. I redressed the chest tubes. There is no air leak.   I will place the chest tubes to waterseal. We will repeat her chest x-ray tomorrow morning.
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
## 2017-04-21 NOTE — Progress Notes (Addendum)
[facility] [place] NAME: Kristin Avila    MR#:  [id number]  DATE OF BIRTH:  07/15/1928  SUBJECTIVE:  CHIEF COMPLAINT:   Chief Complaint  Patient presents with  . Shoulder Pain   No complaints. HR is still 120-150's, on amiodarone drip. REVIEW OF SYSTEMS:  Review of Systems  Constitutional: Negative for chills, fever and malaise/fatigue.  HENT: Negative for sore throat.   Eyes: Negative for blurred vision and double vision.  Respiratory: Negative for cough, hemoptysis, shortness of breath, wheezing and stridor.   Cardiovascular: Negative for chest pain, palpitations, orthopnea and leg swelling.  Gastrointestinal: Negative for abdominal pain, blood in stool, diarrhea, melena, nausea and vomiting.  Genitourinary: Negative for dysuria, flank pain and hematuria.  Musculoskeletal: Negative for back pain and joint pain.  Skin: Negative for rash.  Neurological: Negative for dizziness, sensory change, focal weakness, seizures, loss of consciousness, weakness and headaches.  Endo/Heme/Allergies: Negative for polydipsia.  Psychiatric/Behavioral: Negative for depression. The patient is not nervous/anxious.     DRUG ALLERGIES:  No Known Allergies VITALS:  Blood pressure 130/68, pulse 85, temperature 98.1 F (36.7 C), temperature source Oral, resp. rate 18, height 5\' 2"  (1.575 m), weight 170 lb 6.4 oz (77.3 kg), SpO2 93 %. PHYSICAL EXAMINATION:  Physical Exam  Constitutional: She is oriented to person, place, and time and well-developed, well-nourished, and in no distress.  HENT:  Head: Normocephalic.  Mouth/Throat: Oropharynx is clear and moist.  Eyes: Pupils are equal, round, and reactive to light. Conjunctivae and EOM are normal. No scleral icterus.  Neck: Normal range of motion. Neck supple. No JVD present. No tracheal deviation present.  Cardiovascular: Normal rate, regular rhythm and normal heart sounds.  Exam reveals no gallop.     No murmur heard. Pulmonary/Chest: Effort normal and breath sounds normal. No respiratory distress. She has no wheezes. She has no rales.  Chest tube in situ on the left side with bloody drainage  Abdominal: Soft. Bowel sounds are normal. She exhibits no distension. There is no tenderness. There is no rebound.  Musculoskeletal: Normal range of motion. She exhibits no edema or tenderness.  Neurological: She is alert and oriented to person, place, and time. No cranial nerve deficit.  Skin: No rash noted. No erythema.  Psychiatric: Affect normal.   LABORATORY PANEL:  Female CBC  Recent Labs Lab 04/19/17 1854 04/21/17 0716  WBC 13.5*  --   HGB 8.0* 7.8*  HCT 24.0*  --   PLT 165  --    ------------------------------------------------------------------------------------------------------------------ Chemistries   Recent Labs Lab 04/18/17 0526 04/19/17 0451  04/21/17 0716  NA 139 139  < > 140  K 4.1 4.0  < > 3.9  CL 106 108  < > 108  CO2 25 23  < > 27  GLUCOSE 179* 120*  < > 94  BUN 31* 37*  < > 14  CREATININE 1.19* 1.49*  < > 0.64  CALCIUM 9.1 8.6*  < > 8.4*  MG  --  1.9  --   --   AST 36  --   --   --   ALT 25  --   --   --   ALKPHOS 63  --   --   --   BILITOT 0.7  --   --   --   < > = values in this interval not displayed. RADIOLOGY:  Dg Hip Unilat With Pelvis 2-3 Views Left  Result Date: 04/20/2017 CLINICAL  DATA:  Left hip anterior hip pain. EXAM: DG HIP (WITH OR WITHOUT PELVIS) 2-3V LEFT COMPARISON:  None. FINDINGS: An AP view the pelvis with AP and frogleg views of the left hip are provided. There is lower lumbar degenerative disc disease at L4-5 with associated facet arthropathy. The bony pelvis appears intact with mild osteoarthritic sclerosis of both SI joints along their synovial portion. Moderate size stool ball is seen in the mid pelvis. There is axial joint space narrowing of both hips mild-to-moderate in degree. No acute fracture or suspicious osseous lesions.  Vascular calcifications are identified along the course of the left femoral artery. Injection granulomata are noted about the left buttock. IMPRESSION: 1. Mild axial degenerative joint space narrowing of both hips. 2. No acute osseous abnormality of the pelvis and hips. 3. Osteoarthritis of the SI joints. 4. Lower lumbar degenerative disc disease. Electronically Signed   By: Ashley Royalty M.D.   On: 04/20/2017 14:31   ASSESSMENT AND PLAN:   Right large hemothorax, Hold Eliquis. Status post chest tube placement. Chest x-ray today showed Minimal right basilar atelectasis and likely trace pleural effusion. No pneumothorax. Chest tube drainage about 250 ml clear fluid.  Acute respiratory failure with hypoxia due to above. Improved. Off oxygen.  Anemia due to acute blood loss due to hemothorax. Hb down from 11.8 to 8.8, but stable at 7.8. She did not get PRBC transfusion.  Hypotension. Improved.  ARF due to hypotension and Dehydration.  Improved with fluid support. Hold HCTZ.  Elevated troponin. Possible due to demanding ischemia. hold Eliquis.   A. Fib RVR and sick sinus syndrome, status post pacemaker.  Hold Eliquis and Lopressor due to Hemothorax and hypotension. On amiodarone drip. Add Lopressor 25 mg by mouth every 8 hours per Dr. Ubaldo Glassing.  Osteoarthritis. Hip x-ray show Mild axial degenerative joint space narrowing of both hips.Osteoarthritis of the SI joints. Uncontrolled.  PT evaluation suggested skilled nursing facility placement..  I discussed with Dr. Genevive Bi. All the records are reviewed and case discussed with Care Management/Social Worker. Management plans discussed with the patient, son-in-law and they are in agreement.  CODE STATUS: Full Code  TOTAL TIME TAKING CARE OF THIS PATIENT: 37 minutes.   More than 50% of the time was spent in counseling/coordination of care: YES  POSSIBLE D/C IN 2-3 DAYS, DEPENDING ON CLINICAL CONDITION.   Demetrios Loll M.D on 04/21/2017 at  1:34 PM  Between 7am to 6pm - Pager - [phone]  After 6pm go to [email or web address] - [facility]

## 2017-04-22 ENCOUNTER — Encounter
Admission: RE | Admit: 2017-04-22 | Discharge: 2017-04-22 | Disposition: A | Discharge status: Home / Self Care | Payer: Medicare Other | Source: Ambulatory Visit | Attending: Internal Medicine | Admitting: Internal Medicine

## 2017-04-22 ENCOUNTER — Inpatient Hospital Stay: Payer: Medicare Other

## 2017-04-22 LAB — BASIC METABOLIC PANEL
ANION GAP: 5 (ref 5–15)
BUN: 19 mg/dL (ref 6–20)
CO2: 27 mmol/L (ref 22–32)
Calcium: 8.6 mg/dL — ABNORMAL LOW (ref 8.9–10.3)
Chloride: 104 mmol/L (ref 101–111)
Creatinine, Ser: 0.71 mg/dL (ref 0.44–1.00)
GLUCOSE: 110 mg/dL — AB (ref 65–99)
POTASSIUM: 4.4 mmol/L (ref 3.5–5.1)
SODIUM: 136 mmol/L (ref 135–145)

## 2017-04-22 MED ORDER — AMIODARONE HCL 200 MG PO TABS
200.0000 mg | ORAL_TABLET | Freq: Two times a day (BID) | ORAL | Status: DC
  Administered 2017-04-22 – 2017-04-28 (×13): 200 mg via ORAL
  Filled 2017-04-22 (×13): qty 1

## 2017-04-22 MED ORDER — METOPROLOL SUCCINATE ER 50 MG PO TB24
50.0000 mg | ORAL_TABLET | Freq: Every day | ORAL | Status: DC
  Administered 2017-04-23 – 2017-04-28 (×5): 50 mg via ORAL
  Filled 2017-04-22 (×6): qty 1

## 2017-04-22 MED ORDER — CYCLOBENZAPRINE HCL 10 MG PO TABS
5.0000 mg | ORAL_TABLET | Freq: Three times a day (TID) | ORAL | Status: DC | PRN
  Administered 2017-04-22 – 2017-04-23 (×2): 5 mg via ORAL
  Filled 2017-04-22 (×2): qty 1

## 2017-04-22 NOTE — Progress Notes (Signed)
Physical Therapy Treatment Patient Details Name: Kristin Avila MRN: [id number] DOB: 01-31-1928 Today's Date: 04/22/2017    History of Present Illness Pt admitted for complaints of chest pain and shoulder pain. Pt is now s/p R thoracoscopy and evacuation of R hemothorax. Pt history includes anxiety, HTN, and Afib. Pt with hip pain this date, however imaging negative at this time. Pt now has 3 chest tubes presents. Pt with HR WNL this date. Chest tube to water seal.    PT Comments    Pt was able to get a few more steps today to chair, notably being tired and weak to make the effort.  However, she is sitting up and working on her endurance to progress gait and activity as able.  Will continue on with her therapy acutely to add strength to LE's and decrease assistance needed for all mobility.   Follow Up Recommendations  SNF     Equipment Recommendations  Rolling walker with 5" wheels    Recommendations for Other Services       Precautions / Restrictions Precautions Precautions: Fall (chest tube, foley, IV) Restrictions Weight Bearing Restrictions: No    Mobility  Bed Mobility Overal bed mobility: Needs Assistance Bed Mobility: Supine to Sit Rolling: Mod assist   Supine to sit: Mod assist     General bed mobility comments: assisted trunk and used bed pad for support of pivoting hips out to side of bed  Transfers Overall transfer level: Needs assistance Equipment used: Rolling walker (2 wheeled) Transfers: Sit to/from Stand Sit to Stand: Min assist;Mod assist         General transfer comment: one person to power up then min to control initial standing  Ambulation/Gait Ambulation/Gait assistance: Min assist;+2 physical assistance;+2 safety/equipment (one for lines due to chest tube) Ambulation Distance (Feet): 6 Feet Assistive device: Rolling walker (2 wheeled) Gait Pattern/deviations: Step-to pattern Gait velocity: reduced Gait velocity interpretation: Below  normal speed for age/gender General Gait Details: sidesteps then backward to chair wiht cues for direction and safety   Stairs            Wheelchair Mobility    Modified Rankin (Stroke Patients Only)       Balance Overall balance assessment: Needs assistance Sitting-balance support: Feet supported;Bilateral upper extremity supported Sitting balance-Leahy Scale: Fair     Standing balance support: Bilateral upper extremity supported;During functional activity Standing balance-Leahy Scale: Poor                              Cognition Arousal/Alertness: Awake/alert Behavior During Therapy: WFL for tasks assessed/performed Overall Cognitive Status: Within Functional Limits for tasks assessed                                        Exercises Other Exercises Other Exercises: ROM to ankles and quad sets    General Comments General comments (skin integrity, edema, etc.): foley and chest tube are draining visibly      Pertinent Vitals/Pain Pain Assessment: No/denies pain    Home Living                      Prior Function            PT Goals (current goals can now be found in the care plan section) Acute Rehab PT Goals Patient Stated Goal: to get  stronger Progress towards PT goals: Progressing toward goals    Frequency    Min 2X/week      PT Plan Current plan remains appropriate    Co-evaluation              AM-PAC PT "6 Clicks" Daily Activity  Outcome Measure  Difficulty turning over in bed (including adjusting bedclothes, sheets and blankets)?: Unable Difficulty moving from lying on back to sitting on the side of the bed? : Unable Difficulty sitting down on and standing up from a chair with arms (e.g., wheelchair, bedside commode, etc,.)?: Unable Help needed moving to and from a bed to chair (including a wheelchair)?: A Lot Help needed walking in hospital room?: A Lot Help needed climbing 3-5 steps with a  railing? : Total 6 Click Score: 8    End of Session Equipment Utilized During Treatment: Gait belt (placed above devices in axillae) Activity Tolerance: Patient limited by fatigue Patient left: in chair;with call bell/phone within reach;with chair alarm set;with nursing/sitter in room Nurse Communication: Mobility status PT Visit Diagnosis: Muscle weakness (generalized) (M62.81);History of falling (Z91.81);Difficulty in walking, not elsewhere classified (R26.2)     Time: 4801-6553 PT Time Calculation (min) (ACUTE ONLY): 19 min  Charges:  $Gait Training: 8-22 mins                    G Codes:  Functional Assessment Tool Used: AM-PAC 6 Clicks Basic Mobility     Ramond Dial 04/22/2017, 11:17 AM   Mee Hives, PT MS Acute Rehab Dept. Number: [facility]

## 2017-04-22 NOTE — Progress Notes (Signed)
[facility] [place] NAME: Kristin Avila    MR#:  [id number]  DATE OF BIRTH:  08/09/1928  SUBJECTIVE:  CHIEF COMPLAINT:   Chief Complaint  Patient presents with  . Shoulder Pain   - feels much better. But very weak. Got to a chair today. chest tube still present.  REVIEW OF SYSTEMS:  Review of Systems  Constitutional: Negative for chills and fever.  Respiratory: Negative for cough, shortness of breath and wheezing.   Cardiovascular: Negative for chest pain and palpitations.  Gastrointestinal: Negative for abdominal pain, constipation, diarrhea, nausea and vomiting.  Genitourinary: Negative for dysuria.  Musculoskeletal: Positive for back pain and myalgias.  Neurological: Negative for dizziness, seizures and headaches.    DRUG ALLERGIES:  No Known Allergies  VITALS:  Blood pressure 115/86, pulse 69, temperature 98.2 F (36.8 C), temperature source Oral, resp. rate 20, height 5\' 2"  (1.575 m), weight 77.3 kg (170 lb 6.4 oz), SpO2 94 %.  PHYSICAL EXAMINATION:  Physical Exam  GENERAL:  83 y.o.-year-old patient lying in the bed with no acute distress.  EYES: Pupils equal, round, reactive to light and accommodation. No scleral icterus. Extraocular muscles intact.  HEENT: Head atraumatic, normocephalic. Oropharynx and nasopharynx clear.  NECK:  Supple, no jugular venous distention. No thyroid enlargement, no tenderness.  LUNGS: Normal breath sounds bilaterally, no wheezing, rales,rhonchi or crepitation. No use of accessory muscles of respiration. decreased right basilar breath sounds Right-sided chest tube is noted CARDIOVASCULAR: S1, S2 normal. No rubs, or gallops. 2/6 systolic murmur present ABDOMEN: Soft, nontender, nondistended. Bowel sounds present. No organomegaly or mass.  EXTREMITIES: No pedal edema, cyanosis, or clubbing.  NEUROLOGIC: Cranial nerves II through XII are intact. Muscle strength 5/5 in all extremities. Sensation  intact. Gait not checked.  PSYCHIATRIC: The patient is alert and oriented x 3.  SKIN: No obvious rash, lesion, or ulcer.    LABORATORY PANEL:   CBC  Recent Labs Lab 04/19/17 1854 04/21/17 0716  WBC 13.5*  --   HGB 8.0* 7.8*  HCT 24.0*  --   PLT 165  --    ------------------------------------------------------------------------------------------------------------------  Chemistries   Recent Labs Lab 04/18/17 0526 04/19/17 0451  04/22/17 1242  NA 139 139  < > 136  K 4.1 4.0  < > 4.4  CL 106 108  < > 104  CO2 25 23  < > 27  GLUCOSE 179* 120*  < > 110*  BUN 31* 37*  < > 19  CREATININE 1.19* 1.49*  < > 0.71  CALCIUM 9.1 8.6*  < > 8.6*  MG  --  1.9  --   --   AST 36  --   --   --   ALT 25  --   --   --   ALKPHOS 63  --   --   --   BILITOT 0.7  --   --   --   < > = values in this interval not displayed. ------------------------------------------------------------------------------------------------------------------  Cardiac Enzymes  Recent Labs Lab 04/18/17 1106  TROPONINI 0.09*   ------------------------------------------------------------------------------------------------------------------  RADIOLOGY:  Dg Chest 2 View  Result Date: 04/22/2017 CLINICAL DATA:  Status post thoracoscopy and evacuation of a hemothorax on the right 3 days ago. EXAM: CHEST  2 VIEW COMPARISON:  Portable chest x-ray of April 20, 2017 FINDINGS: The lungs are adequately inflated. A tiny right apical pneumothorax amounting to less than 5% of the lung volume is noted. The 2 right-sided chest  tubes are in stable position. There is no right pleural effusion. On the left there is basilar atelectasis and small effusion. The heart is enlarged. The pulmonary vascularity is mildly prominent centrally. The ICD is in stable position. There is calcification in the wall of the aortic arch. IMPRESSION: 5% or less right apical pneumothorax. Stable positioning of the chest tubes with the uppermost tube  tip projecting over the medial aspect of the right fourth posterior rib. Left lower lobe atelectasis or pneumonia increased since the study of 2 days ago periods probable trace left pleural effusion. Thoracic aortic atherosclerosis. Electronically Signed   By: David  Martinique M.D.   On: 04/22/2017 07:24    EKG:   Orders placed or performed during the hospital encounter of 04/18/17  . ED EKG within 10 minutes  . ED EKG within 10 minutes  . EKG 12-Lead  . EKG 12-Lead  . EKG 12-Lead  . EKG 12-Lead  . EKG 12-Lead  . EKG 12-Lead    ASSESSMENT AND PLAN:   83 year old female with past medical history significant for atrial fibrillation, sick sinus syndrome status post pacemaker, hypertension, hyperlipidemia and arthritis who takes eliquis at home for her A. Fib comes secondary to right-sided chest pain and shoulder pain and noted to have right-sided hemothorax.   #1 right-sided hemothorax-secondary to likely spontaneous bullous rupture, I'll patient on eliquis. -Appreciate thoracic surgery consultation -received K centra -Status post thoracoscopy, evacuation and has a right-sided chest tube. Still has some bloody drainage. Continue for now. -Continue to monitor hemoglobin  #2 acute on chronic anemia-hemoglobin drop secondary to hemothorax. -Transfuse if hemoglobin is less than 7. Monitor carefully  #3 atrial fibrillation-change amiodarone trip to oral amiodarone. Patient is status post pacemaker -Change metoprolol to home dose of Toprol at this time. Heart rate is well controlled -No eliquis for now due to bleeding  #4 depression-on Prozac  #5 DVT prophylaxis-Ted's and SCDs      All the records are reviewed and case discussed with Care Management/Social Workerr. Management plans discussed with the patient, family and they are in agreement.  CODE STATUS: full code  TOTAL TIME TAKING CARE OF THIS PATIENT: 38 minutes.   POSSIBLE D/C IN 2 DAYS, DEPENDING ON CLINICAL  CONDITION.   Gladstone Lighter M.D on 04/22/2017 at 2:27 PM  Between 7am to 6pm - Pager - [phone]  After 6pm go to [email or web address] - password [facility]  Office  [phone]  CC: Primary care physician; Einar Pheasant, MD

## 2017-04-22 NOTE — Plan of Care (Signed)
Problem: Education: Goal: Knowledge of [facility] Education information/materials will improve Outcome: Completed/Met Date Met: 04/22/17 No complaints of pain this shift, chest tube to right side still in place draining yellow/brown clear fluid. Foley also still in place for retention. LR & amio gtt still infusing. Pt given trazodone to help sleep.

## 2017-04-22 NOTE — Progress Notes (Signed)
Today she complains of some pain in her left chest as well as in her hip. She states that she does not have any pain on the surgical side. She's not short of breath.  She put out 310 cc over the last 24 hours from her chest tube. This is above our cutoff of 200 cc for chest tube removal.  Her wounds are clean and dry. There is no drainage. There is no air leak from the system. The fluid is serous in nature.  Independent review of her chest x-ray today shows a very small apical pneumothorax.  I will leave the chest tubes on waterseal today. We will continue to monitor the output. She certainly is a candidate for ambulation in the halls. I have no objections to that.

## 2017-04-23 ENCOUNTER — Inpatient Hospital Stay: Payer: Medicare Other

## 2017-04-23 LAB — BASIC METABOLIC PANEL
ANION GAP: 9 (ref 5–15)
BUN: 17 mg/dL (ref 6–20)
CALCIUM: 8.4 mg/dL — AB (ref 8.9–10.3)
CO2: 24 mmol/L (ref 22–32)
Chloride: 106 mmol/L (ref 101–111)
Creatinine, Ser: 0.75 mg/dL (ref 0.44–1.00)
GFR calc Af Amer: >60 mL/min (ref >60)
GLUCOSE: 91 mg/dL (ref 65–99)
Potassium: 4.1 mmol/L (ref 3.5–5.1)
Sodium: 139 mmol/L (ref 135–145)

## 2017-04-23 LAB — BODY FLUID CULTURE: Culture: NO GROWTH

## 2017-04-23 LAB — CBC
HEMATOCRIT: 22.9 % — AB (ref 35.0–47.0)
HEMOGLOBIN: 7.7 g/dL — AB (ref 12.0–16.0)
MCH: 28.4 pg (ref 26.0–34.0)
MCHC: 33.5 g/dL (ref 32.0–36.0)
MCV: 84.7 fL (ref 80.0–100.0)
PLATELETS: 227 10*3/uL (ref 150–440)
RBC: 2.71 MIL/uL — ABNORMAL LOW (ref 3.80–5.20)
RDW: 16.3 % — AB (ref 11.5–14.5)
WBC: 10.8 10*3/uL (ref 3.6–11.0)

## 2017-04-23 NOTE — Progress Notes (Signed)
[facility] [place] NAME: Kristin Avila    MR#:  [id number]  DATE OF BIRTH:  1928/01/07  SUBJECTIVE:  CHIEF COMPLAINT:   Chief Complaint  Patient presents with  . Shoulder Pain   - complaint of left groin pain last night, none today. Just some soreness while sitting. -one of the chest tubes removed, the other one is draining serous fluid  REVIEW OF SYSTEMS:  Review of Systems  Constitutional: Negative for chills and fever.  Respiratory: Negative for cough, shortness of breath and wheezing.   Cardiovascular: Negative for chest pain and palpitations.  Gastrointestinal: Negative for abdominal pain, constipation, diarrhea, nausea and vomiting.  Genitourinary: Negative for dysuria.  Musculoskeletal: Positive for back pain and myalgias.  Neurological: Negative for dizziness, seizures and headaches.    DRUG ALLERGIES:  No Known Allergies  VITALS:  Blood pressure (!) 136/53, pulse 65, temperature (!) 97.5 F (36.4 C), temperature source Oral, resp. rate 16, height 5\' 2"  (1.575 m), weight 77.3 kg (170 lb 6.4 oz), SpO2 97 %.  PHYSICAL EXAMINATION:  Physical Exam  GENERAL:  83 y.o.-year-old patient lying in the bed with no acute distress. Hard of hearing EYES: Pupils equal, round, reactive to light and accommodation. No scleral icterus. Extraocular muscles intact.  HEENT: Head atraumatic, normocephalic. Oropharynx and nasopharynx clear.  NECK:  Supple, no jugular venous distention. No thyroid enlargement, no tenderness.  LUNGS: Normal breath sounds bilaterally, no wheezing, rales,rhonchi or crepitation. No use of accessory muscles of respiration. decreased right basilar breath sounds Right-sided chest tube is noted CARDIOVASCULAR: S1, S2 normal. No rubs, or gallops. 2/6 systolic murmur present ABDOMEN: Soft, nontender, nondistended. Bowel sounds present. No organomegaly or mass.  EXTREMITIES: No pedal edema, cyanosis, or clubbing.    NEUROLOGIC: Cranial nerves II through XII are intact. Muscle strength 5/5 in all extremities. Sensation intact. Gait not checked.  PSYCHIATRIC: The patient is alert and oriented x 3.  SKIN: No obvious rash, lesion, or ulcer.    LABORATORY PANEL:   CBC  Recent Labs Lab 04/23/17 0613  WBC 10.8  HGB 7.7*  HCT 22.9*  PLT 227   ------------------------------------------------------------------------------------------------------------------  Chemistries   Recent Labs Lab 04/18/17 0526 04/19/17 0451  04/23/17 0613  NA 139 139  < > 139  K 4.1 4.0  < > 4.1  CL 106 108  < > 106  CO2 25 23  < > 24  GLUCOSE 179* 120*  < > 91  BUN 31* 37*  < > 17  CREATININE 1.19* 1.49*  < > 0.75  CALCIUM 9.1 8.6*  < > 8.4*  MG  --  1.9  --   --   AST 36  --   --   --   ALT 25  --   --   --   ALKPHOS 63  --   --   --   BILITOT 0.7  --   --   --   < > = values in this interval not displayed. ------------------------------------------------------------------------------------------------------------------  Cardiac Enzymes  Recent Labs Lab 04/18/17 1106  TROPONINI 0.09*   ------------------------------------------------------------------------------------------------------------------  RADIOLOGY:  Dg Chest 2 View  Result Date: 04/23/2017 CLINICAL DATA:  Postoperative evaluation.  Thoracoscopy 4 days ago. EXAM: CHEST  2 VIEW COMPARISON:  Yesterday FINDINGS: Small right apical pneumothorax, less than 5%, unchanged. Right-sided chest tubes in stable position, 1 tip at the apex, the other at the base. There is artifact from EKG leads. Dual-chamber pacer from  the left. Small left pleural effusion and retrocardiac lung opacity. Stable cardiomegaly. IMPRESSION: Stable small right apical pneumothorax, less than 5%. Chest tubes are in stable position. Electronically Signed   By: Monte Fantasia M.D.   On: 04/23/2017 08:38   Dg Chest 2 View  Result Date: 04/22/2017 CLINICAL DATA:  Status post  thoracoscopy and evacuation of a hemothorax on the right 3 days ago. EXAM: CHEST  2 VIEW COMPARISON:  Portable chest x-ray of April 20, 2017 FINDINGS: The lungs are adequately inflated. A tiny right apical pneumothorax amounting to less than 5% of the lung volume is noted. The 2 right-sided chest tubes are in stable position. There is no right pleural effusion. On the left there is basilar atelectasis and small effusion. The heart is enlarged. The pulmonary vascularity is mildly prominent centrally. The ICD is in stable position. There is calcification in the wall of the aortic arch. IMPRESSION: 5% or less right apical pneumothorax. Stable positioning of the chest tubes with the uppermost tube tip projecting over the medial aspect of the right fourth posterior rib. Left lower lobe atelectasis or pneumonia increased since the study of 2 days ago periods probable trace left pleural effusion. Thoracic aortic atherosclerosis. Electronically Signed   By: David  Martinique M.D.   On: 04/22/2017 07:24    EKG:   Orders placed or performed during the hospital encounter of 04/18/17  . ED EKG within 10 minutes  . ED EKG within 10 minutes  . EKG 12-Lead  . EKG 12-Lead  . EKG 12-Lead  . EKG 12-Lead  . EKG 12-Lead  . EKG 12-Lead    ASSESSMENT AND PLAN:   83 year old female with past medical history significant for atrial fibrillation, sick sinus syndrome status post pacemaker, hypertension, hyperlipidemia and arthritis who takes eliquis at home for her A. Fib comes secondary to right-sided chest pain and shoulder pain and noted to have right-sided hemothorax.   #1 right-sided hemothorax-secondary to likely spontaneous bullous rupture, and patient on eliquis. -Appreciate thoracic surgery consultation -received K centra -Status post thoracoscopy, evacuation and has a right-sided chest tubes. One of them removed today, the other with serous drainage -Continue to monitor hemoglobin  #2 acute on chronic  anemia-hemoglobin drop secondary to hemothorax. -Transfuse if hemoglobin is less than 7. Monitor carefully -will not be discharged on eliquis  #3 atrial fibrillation-changed amiodarone trip to oral amiodarone.  -Patient is status post pacemaker -Changed metoprolol to home dose of Toprol. Heart rate is well controlled -No eliquis for now due to bleeding- discussed with son and patient and they are agreeable until seen by outpatient cardiologist  #4 depression-on Prozac  #5 DVT prophylaxis-Ted's and SCDs   PT following- recommended SNF for now    All the records are reviewed and case discussed with Care Management/Social Workerr. Management plans discussed with the patient, family and they are in agreement.  CODE STATUS: full code  TOTAL TIME TAKING CARE OF THIS PATIENT: 37 minutes.   POSSIBLE D/C IN 2 DAYS, DEPENDING ON CLINICAL CONDITION.   Gladstone Lighter M.D on 04/23/2017 at 12:16 PM  Between 7am to 6pm - Pager - [phone]  After 6pm go to [email or web address] - password [email or web address]  Office  [phone]  CC: Primary care physician; Einar Pheasant, MD

## 2017-04-23 NOTE — Clinical Social Work Placement (Addendum)
   CLINICAL SOCIAL WORK PLACEMENT  NOTE  Date:  04/23/2017  Patient Details  Name: Kristin Avila MRN: [id number] Date of Birth: 01/24/1928  Clinical Social Work is seeking post-discharge placement for this patient at the [place] level of care (*CSW will initial, date and re-position this form in  chart as items are completed):  Yes   Patient/family provided with [facility]'s list of facilities offering this level of care within the geographic area requested by the patient (or if unable, by the patient's family).  Yes   Patient/family informed of their freedom to choose among providers that offer the needed level of care, that participate in Medicare, Medicaid or managed care program needed by the patient, have an available bed and are willing to accept the patient.  Yes   Patient/family informed of [place]'s ownership interest in [facility] and [facility], as well as of the fact that they are under no obligation to receive care at these facilities.  PASRR submitted to EDS on 04/20/17     PASRR number received on       Existing PASRR number confirmed on 04/20/17     FL2 transmitted to all facilities in geographic area requested by pt/family on 04/20/17     FL2 transmitted to all facilities within larger geographic area on       Patient informed that his/her managed care company has contracts with or will negotiate with certain facilities, including the following:        Yes   Patient/family informed of bed offers received.  Patient chooses bed at [facility]     Physician recommends and patient chooses bed at      Patient to be transferred to [facility] on  .  Patient to be transferred to facility by  [facility]     Patient family notified on  04-28-17 of transfer.  Name of family member notified: Message left on patient's daughter Arbie Cookey [phone] voice mail.     PHYSICIAN Please sign FL2      Additional Comment:    _______________________________________________ Ross Ludwig, LCSWA 04/23/2017, 4:54 PM

## 2017-04-23 NOTE — Clinical Social Work Note (Signed)
CSW spoke to patient's daughter Arbie Cookey, [phone] and informed her that patient still has a bed available at [facility].  CSW spoke to [facility] who confirmed if patient is ready for discharge over the weekend they can accept her.  Jones Broom. Frederickson, MSW, [place]  04/23/2017 4:04 PM

## 2017-04-23 NOTE — Progress Notes (Signed)
Not short of breath and no chest pain on the right but rather the left side  Afebrile  CXRay from today shows very small apical pneumothorax which is stable.  CT drained 210 cc of serous fluid   Wounds clean and dry  Anterior chest tube removed.  Posterior chest tube (Blake drain) still in place  Anticipate chest tube removal tomorrow  [employer].

## 2017-04-24 ENCOUNTER — Inpatient Hospital Stay: Payer: Medicare Other

## 2017-04-24 NOTE — Progress Notes (Signed)
[place] at [place] NAME: Kristin Avila    MR#:  [id number]  DATE OF BIRTH:  May 23, 1928  SUBJECTIVE:  CHIEF COMPLAINT:   Chief Complaint  Patient presents with  . Shoulder Pain   - Feels well, hard of hearing. -Complains of some soreness in the right chest around the chest tube site. Anterior chest tube removed. Posterior chest tube still in place  REVIEW OF SYSTEMS:  Review of Systems  Constitutional: Positive for malaise/fatigue. Negative for chills and fever.  HENT: Positive for hearing loss. Negative for congestion, ear discharge and nosebleeds.   Eyes: Negative for blurred vision and double vision.  Respiratory: Negative for cough, shortness of breath and wheezing.   Cardiovascular: Positive for chest pain. Negative for palpitations and leg swelling.  Gastrointestinal: Negative for abdominal pain, constipation, diarrhea, nausea and vomiting.  Genitourinary: Negative for dysuria.  Musculoskeletal: Positive for back pain and myalgias.  Neurological: Negative for dizziness, seizures and headaches.    DRUG ALLERGIES:  No Known Allergies  VITALS:  Blood pressure (!) 141/59, pulse 69, temperature (!) 97.5 F (36.4 C), temperature source Oral, resp. rate 18, height 5\' 2"  (1.575 m), weight 77.3 kg (170 lb 6.4 oz), SpO2 94 %.  PHYSICAL EXAMINATION:  Physical Exam  GENERAL:  83 y.o.-year-old patient lying in the bed with no acute distress. Hard of hearing EYES: Pupils equal, round, reactive to light and accommodation. No scleral icterus. Extraocular muscles intact.  HEENT: Head atraumatic, normocephalic. Oropharynx and nasopharynx clear.  NECK:  Supple, no jugular venous distention. No thyroid enlargement, no tenderness.  LUNGS: Normal breath sounds bilaterally, no wheezing, rales,rhonchi or crepitation. No use of accessory muscles of respiration. decreased right basilar breath sounds Right-sided chest tube is  noted CARDIOVASCULAR: S1, S2 normal. No rubs, or gallops. 2/6 systolic murmur present ABDOMEN: Soft, nontender, nondistended. Bowel sounds present. No organomegaly or mass.  EXTREMITIES: No pedal edema, cyanosis, or clubbing.  NEUROLOGIC: Cranial nerves II through XII are intact. Muscle strength 5/5 in all extremities. Sensation intact. Gait not checked.  PSYCHIATRIC: The patient is alert and oriented x 3.  SKIN: No obvious rash, lesion, or ulcer.    LABORATORY PANEL:   CBC  Recent Labs Lab 04/23/17 0613  WBC 10.8  HGB 7.7*  HCT 22.9*  PLT 227   ------------------------------------------------------------------------------------------------------------------  Chemistries   Recent Labs Lab 04/18/17 0526 04/19/17 0451  04/23/17 0613  NA 139 139  < > 139  K 4.1 4.0  < > 4.1  CL 106 108  < > 106  CO2 25 23  < > 24  GLUCOSE 179* 120*  < > 91  BUN 31* 37*  < > 17  CREATININE 1.19* 1.49*  < > 0.75  CALCIUM 9.1 8.6*  < > 8.4*  MG  --  1.9  --   --   AST 36  --   --   --   ALT 25  --   --   --   ALKPHOS 63  --   --   --   BILITOT 0.7  --   --   --   < > = values in this interval not displayed. ------------------------------------------------------------------------------------------------------------------  Cardiac Enzymes  Recent Labs Lab 04/18/17 1106  TROPONINI 0.09*   ------------------------------------------------------------------------------------------------------------------  RADIOLOGY:  Dg Chest 2 View  Result Date: 04/23/2017 CLINICAL DATA:  Postoperative evaluation.  Thoracoscopy 4 days ago. EXAM: CHEST  2 VIEW COMPARISON:  Yesterday FINDINGS: Small right apical  pneumothorax, less than 5%, unchanged. Right-sided chest tubes in stable position, 1 tip at the apex, the other at the base. There is artifact from EKG leads. Dual-chamber pacer from the left. Small left pleural effusion and retrocardiac lung opacity. Stable cardiomegaly. IMPRESSION: Stable small  right apical pneumothorax, less than 5%. Chest tubes are in stable position. Electronically Signed   By: Monte Fantasia M.D.   On: 04/23/2017 08:38   Dg Chest Port 1 View  Result Date: 04/24/2017 CLINICAL DATA:  Pneumothorax. EXAM: PORTABLE CHEST 1 VIEW COMPARISON:  Chest x-ray from yesterday. FINDINGS: Interval removal of the right apical chest tube. The right basilar chest tube is unchanged in position. The right apical pneumothorax is slightly increased in size, now approximately 10%. Stable cardiomegaly. Unchanged left chest wall AICD. Mild pulmonary vascular congestion. Unchanged left basilar opacity and small bilateral pleural effusions. IMPRESSION: 1. Interval removal of the right apical chest tube. Small right apical pneumothorax is slightly increased in size, now approximately 10%. 2. Stable cardiomegaly, mild pulmonary vascular congestion, and small bilateral pleural effusions. Unchanged left basilar atelectasis versus infiltrate. These results will be called to the ordering clinician or representative by the Radiologist Assistant, and communication documented in the PACS or zVision Dashboard. Electronically Signed   By: Titus Dubin M.D.   On: 04/24/2017 08:39    EKG:   Orders placed or performed during the hospital encounter of 04/18/17  . ED EKG within 10 minutes  . ED EKG within 10 minutes  . EKG 12-Lead  . EKG 12-Lead  . EKG 12-Lead  . EKG 12-Lead  . EKG 12-Lead  . EKG 12-Lead    ASSESSMENT AND PLAN:   83 year old female with past medical history significant for atrial fibrillation, sick sinus syndrome status post pacemaker, hypertension, hyperlipidemia and arthritis who takes eliquis at home for her A. Fib comes secondary to right-sided chest pain and shoulder pain and noted to have right-sided hemothorax.   #1 right-sided hemothorax-secondary to likely spontaneous bullous rupture, and patient on eliquis. -Appreciate thoracic surgery consultation -received K  centra -Status post thoracoscopy, evacuation and has right-sided chest tubes. Anterior one is removed, the other with serous drainage -Continue to monitor hemoglobin - Chest x-ray today shows slightly increased apical pneumothorax, now at 10% size. Await thoracic surgery input to see when the second chest tube can be removed  #2 acute on chronic anemia-hemoglobin drop secondary to hemothorax. -Transfuse if hemoglobin is less than 7. Monitor carefully -will not be discharged on eliquis  #3 atrial fibrillation-changed amiodarone trip to oral amiodarone.  -Patient is status post pacemaker -Changed metoprolol to home dose of Toprol. Heart rate is well controlled -No eliquis for now due to bleeding- discussed with son and patient and they are agreeable until seen by outpatient cardiologist  #4 depression-on Prozac  #5 DVT prophylaxis-Ted's and SCDs   PT following- recommended SNF for now Likely discharge on Monday    All the records are reviewed and case discussed with Care Management/Social Workerr. Management plans discussed with the patient, family and they are in agreement.  CODE STATUS: full code  TOTAL TIME TAKING CARE OF THIS PATIENT: 37 minutes.   POSSIBLE D/C IN 2 DAYS, DEPENDING ON CLINICAL CONDITION.   Gladstone Lighter M.D on 04/24/2017 at 8:54 AM  Between 7am to 6pm - Pager - [phone]  After 6pm go to [email or web address] - password [email or web address]  Office  [phone]  CC: Primary care physician; Einar Pheasant, MD

## 2017-04-24 NOTE — Progress Notes (Signed)
Seen and examined. Small air leak on valsalva. CXR small apical ptx slightly worst  Plan for CT suction No other surgical issues

## 2017-04-25 ENCOUNTER — Inpatient Hospital Stay: Payer: Medicare Other

## 2017-04-25 LAB — CBC
HEMATOCRIT: 21.8 % — AB (ref 35.0–47.0)
HEMOGLOBIN: 7.2 g/dL — AB (ref 12.0–16.0)
MCH: 28.3 pg (ref 26.0–34.0)
MCHC: 33.2 g/dL (ref 32.0–36.0)
MCV: 85.1 fL (ref 80.0–100.0)
Platelets: 262 10*3/uL (ref 150–440)
RBC: 2.56 MIL/uL — ABNORMAL LOW (ref 3.80–5.20)
RDW: 16 % — AB (ref 11.5–14.5)
WBC: 8.1 10*3/uL (ref 3.6–11.0)

## 2017-04-25 NOTE — Progress Notes (Signed)
While rounding on pt. It was observed she had peeled the labels off her blood bank bracelet and chewed of the end of her IV catheter and blood bank bracelet. It was explained to the the pt. She could not do these things because they were there incase she needed them. Pt. Stated she under stood. Will continue to monitor pt.

## 2017-04-25 NOTE — Progress Notes (Signed)
Vision seen and examined. Unfortunately when I came to examine the patient the chest he was not on suction. Discussed with the nursing staff and reiterated that there was an order from yesterday but apparently never got executed.  x-ray showed a small apical pneumothorax.  PE NAD Chest tube in place no airleak. Suction will be connected by RN Abd: soft , NT  A/P We will get CT suction and likely this will resolve the small PTX

## 2017-04-25 NOTE — Progress Notes (Signed)
[place] at [place] NAME: Kristin Avila    MR#:  [id number]  DATE OF BIRTH:  [birth year]  SUBJECTIVE:  CHIEF COMPLAINT:   Chief Complaint  Patient presents with  . Shoulder Pain   - Doing well. No complaints today. Chest tube for suction. Awaiting surgical consult to see if chest tube can be removed.  REVIEW OF SYSTEMS:  Review of Systems  Constitutional: Positive for malaise/fatigue. Negative for chills and fever.  HENT: Positive for hearing loss. Negative for congestion, ear discharge and nosebleeds.   Eyes: Negative for blurred vision and double vision.  Respiratory: Negative for cough, shortness of breath and wheezing.   Cardiovascular: Negative for chest pain, palpitations and leg swelling.  Gastrointestinal: Negative for abdominal pain, constipation, diarrhea, nausea and vomiting.  Genitourinary: Negative for dysuria.  Musculoskeletal: Positive for myalgias. Negative for back pain.  Neurological: Negative for dizziness, seizures and headaches.    DRUG ALLERGIES:  No Known Allergies  VITALS:  Blood pressure (!) 109/49, pulse 67, temperature 97.8 F (36.6 C), temperature source Oral, resp. rate 18, height 5\' 2"  (1.575 m), weight 77.3 kg (170 lb 6.4 oz), SpO2 96 %.  PHYSICAL EXAMINATION:  Physical Exam  GENERAL:  83 y.o.-year-old patient lying in the bed with no acute distress. Hard of hearing EYES: Pupils equal, round, reactive to light and accommodation. No scleral icterus. Extraocular muscles intact.  HEENT: Head atraumatic, normocephalic. Oropharynx and nasopharynx clear.  NECK:  Supple, no jugular venous distention. No thyroid enlargement, no tenderness.  LUNGS: Normal breath sounds bilaterally, no wheezing, rales,rhonchi or crepitation. No use of accessory muscles of respiration. decreased right basilar breath sounds Right-sided chest tube is noted CARDIOVASCULAR: S1, S2 normal. No rubs, or gallops. 2/6 systolic  murmur present ABDOMEN: Soft, nontender, nondistended. Bowel sounds present. No organomegaly or mass.  EXTREMITIES: No pedal edema, cyanosis, or clubbing.  NEUROLOGIC: Cranial nerves II through XII are intact. Muscle strength 5/5 in all extremities. Sensation intact. Gait not checked.  PSYCHIATRIC: The patient is alert and oriented x 3.  SKIN: No obvious rash, lesion, or ulcer.    LABORATORY PANEL:   CBC  Recent Labs Lab 04/25/17 0627  WBC 8.1  HGB 7.2*  HCT 21.8*  PLT 262   ------------------------------------------------------------------------------------------------------------------  Chemistries   Recent Labs Lab 04/19/17 0451  04/23/17 0613  NA 139  < > 139  K 4.0  < > 4.1  CL 108  < > 106  CO2 23  < > 24  GLUCOSE 120*  < > 91  BUN 37*  < > 17  CREATININE 1.49*  < > 0.75  CALCIUM 8.6*  < > 8.4*  MG 1.9  --   --   < > = values in this interval not displayed. ------------------------------------------------------------------------------------------------------------------  Cardiac Enzymes  Recent Labs Lab 04/18/17 1106  TROPONINI 0.09*   ------------------------------------------------------------------------------------------------------------------  RADIOLOGY:  Dg Chest 2 View  Result Date: 04/23/2017 CLINICAL DATA:  Postoperative evaluation.  Thoracoscopy 4 days ago. EXAM: CHEST  2 VIEW COMPARISON:  Yesterday FINDINGS: Small right apical pneumothorax, less than 5%, unchanged. Right-sided chest tubes in stable position, 1 tip at the apex, the other at the base. There is artifact from EKG leads. Dual-chamber pacer from the left. Small left pleural effusion and retrocardiac lung opacity. Stable cardiomegaly. IMPRESSION: Stable small right apical pneumothorax, less than 5%. Chest tubes are in stable position. Electronically Signed   By: Monte Fantasia M.D.   On: 04/23/2017 08:38  Dg Chest Port 1 View  Result Date: 04/24/2017 CLINICAL DATA:  Pneumothorax.  EXAM: PORTABLE CHEST 1 VIEW COMPARISON:  Chest x-ray from yesterday. FINDINGS: Interval removal of the right apical chest tube. The right basilar chest tube is unchanged in position. The right apical pneumothorax is slightly increased in size, now approximately 10%. Stable cardiomegaly. Unchanged left chest wall AICD. Mild pulmonary vascular congestion. Unchanged left basilar opacity and small bilateral pleural effusions. IMPRESSION: 1. Interval removal of the right apical chest tube. Small right apical pneumothorax is slightly increased in size, now approximately 10%. 2. Stable cardiomegaly, mild pulmonary vascular congestion, and small bilateral pleural effusions. Unchanged left basilar atelectasis versus infiltrate. These results will be called to the ordering clinician or representative by the Radiologist Assistant, and communication documented in the PACS or zVision Dashboard. Electronically Signed   By: Titus Dubin M.D.   On: 04/24/2017 08:39    EKG:   Orders placed or performed during the hospital encounter of 04/18/17  . ED EKG within 10 minutes  . ED EKG within 10 minutes  . EKG 12-Lead  . EKG 12-Lead  . EKG 12-Lead  . EKG 12-Lead  . EKG 12-Lead  . EKG 12-Lead    ASSESSMENT AND PLAN:   83 year old female with past medical history significant for atrial fibrillation, sick sinus syndrome status post pacemaker, hypertension, hyperlipidemia and arthritis who takes eliquis at home for her A. Fib comes secondary to right-sided chest pain and shoulder pain and noted to have right-sided hemothorax.   #1 right-sided hemothorax-secondary to likely spontaneous bullous rupture, and patient on eliquis. -Appreciate thoracic surgery consultation -received K centra -Status post thoracoscopy, evacuation and has right-sided chest tubes. Anterior one is removed, the other with serous drainage- to suction and water seal now -Continue to monitor hemoglobin - Chest x-ray today shows slightly  increased apical pneumothorax, but chest tube to water seal. Await thoracic surgery input to see when the second chest tube can be removed  #2 acute on chronic anemia-hemoglobin drop secondary to hemothorax. -Transfuse if hemoglobin is less than 7. Monitor carefully -will not be discharged on eliquis  #3 atrial fibrillation-changed amiodarone trip to oral amiodarone.  -Patient is status post pacemaker -Changed metoprolol to home dose of Toprol. Heart rate is well controlled -No eliquis for now due to bleeding- discussed with son and patient and they are agreeable until seen by outpatient cardiologist  #4 depression-on Prozac  #5 DVT prophylaxis-Ted's and SCDs   PT following- recommended SNF for now Likely discharge on Monday if chest tube comes out    All the records are reviewed and case discussed with Care Management/Social Workerr. Management plans discussed with the patient, family and they are in agreement.  CODE STATUS: full code  TOTAL TIME TAKING CARE OF THIS PATIENT: 35 minutes.   POSSIBLE D/C TOMORROW, DEPENDING ON CLINICAL CONDITION.   Gladstone Lighter M.D on 04/25/2017 at 8:04 AM  Between 7am to 6pm - Pager - [phone]  After 6pm go to [email or web address] - password [facility]  Office  [phone]  CC: Primary care physician; Einar Pheasant, MD

## 2017-04-26 ENCOUNTER — Inpatient Hospital Stay: Payer: Medicare Other

## 2017-04-26 LAB — CBC
HCT: 20.5 % — ABNORMAL LOW (ref 35.0–47.0)
HEMOGLOBIN: 6.9 g/dL — AB (ref 12.0–16.0)
MCH: 28.4 pg (ref 26.0–34.0)
MCHC: 33.5 g/dL (ref 32.0–36.0)
MCV: 84.9 fL (ref 80.0–100.0)
Platelets: 284 10*3/uL (ref 150–440)
RBC: 2.42 MIL/uL — ABNORMAL LOW (ref 3.80–5.20)
RDW: 15.9 % — AB (ref 11.5–14.5)
WBC: 7 10*3/uL (ref 3.6–11.0)

## 2017-04-26 LAB — PREPARE RBC (CROSSMATCH)

## 2017-04-26 MED ORDER — SODIUM CHLORIDE 0.9 % IV SOLN
Freq: Once | INTRAVENOUS | Status: AC
  Administered 2017-04-26: 15:00:00 via INTRAVENOUS

## 2017-04-26 NOTE — Progress Notes (Signed)
Doing well.  Very alert today and cheerful.  CT drained 100 cc of serous fluid No air leak with multiple maneuvers  CXRay shows a very small and stable pneumothorax  Wounds redressed.  Clean and without erythema or drainage  Will place to water seal Repeat CXRay in the morning. Hopefully chest tube removal tomorrow.  [employer].

## 2017-04-26 NOTE — Progress Notes (Signed)
[place] at [place] NAME: Kristin Avila    MR#:  [id number]  DATE OF BIRTH:  1928/05/25  SUBJECTIVE:  CHIEF COMPLAINT:   Chief Complaint  Patient presents with  . Shoulder Pain   - Doing well. No complaints today. Chest tube for suction.Denies any shortness of breath  REVIEW OF SYSTEMS:  Review of Systems  Constitutional: Positive for malaise/fatigue. Negative for chills and fever.  HENT: Positive for hearing loss. Negative for congestion, ear discharge and nosebleeds.   Eyes: Negative for blurred vision and double vision.  Respiratory: Negative for cough, shortness of breath and wheezing.   Cardiovascular: Negative for chest pain, palpitations and leg swelling.  Gastrointestinal: Negative for abdominal pain, constipation, diarrhea, nausea and vomiting.  Genitourinary: Negative for dysuria.  Musculoskeletal: Positive for myalgias. Negative for back pain.  Neurological: Negative for dizziness, seizures and headaches.    DRUG ALLERGIES:  No Known Allergies  VITALS:  Blood pressure (!) 126/50, pulse 70, temperature 98 F (36.7 C), temperature source Oral, resp. rate 18, height 5\' 2"  (1.575 m), weight 77.3 kg (170 lb 6.4 oz), SpO2 98 %.  PHYSICAL EXAMINATION:  Physical Exam  GENERAL:  83 y.o.-year-old patient lying in the bed with no acute distress. Hard of hearing EYES: Pupils equal, round, reactive to light and accommodation. No scleral icterus. Extraocular muscles intact.  HEENT: Head atraumatic, normocephalic. Oropharynx and nasopharynx clear.  NECK:  Supple, no jugular venous distention. No thyroid enlargement, no tenderness.  LUNGS: Normal breath sounds bilaterally, no wheezing, rales,rhonchi or crepitation. No use of accessory muscles of respiration. decreased right basilar breath sounds Right-sided chest tube is noted CARDIOVASCULAR: S1, S2 normal. No rubs, or gallops. 2/6 systolic murmur present ABDOMEN: Soft,  nontender, nondistended. Bowel sounds present. No organomegaly or mass.  EXTREMITIES: No pedal edema, cyanosis, or clubbing.  NEUROLOGIC: Cranial nerves II through XII are intact. Muscle strength 5/5 in all extremities. Sensation intact. Gait not checked.  PSYCHIATRIC: The patient is alert and oriented x 3.  SKIN: No obvious rash, lesion, or ulcer.    LABORATORY PANEL:   CBC  Recent Labs Lab 04/26/17 0616  WBC 7.0  HGB 6.9*  HCT 20.5*  PLT 284   ------------------------------------------------------------------------------------------------------------------  Chemistries   Recent Labs Lab 04/23/17 0613  NA 139  K 4.1  CL 106  CO2 24  GLUCOSE 91  BUN 17  CREATININE 0.75  CALCIUM 8.4*   ------------------------------------------------------------------------------------------------------------------  Cardiac Enzymes No results for input(s): TROPONINI in the last 168 hours. ------------------------------------------------------------------------------------------------------------------  RADIOLOGY:  Dg Chest Port 1 View  Result Date: 04/26/2017 CLINICAL DATA:  Status post right thoracoscopy. EXAM: PORTABLE CHEST 1 VIEW COMPARISON:  Radiograph of April 25, 2017. FINDINGS: Stable cardiomegaly. Atherosclerosis of thoracic aorta is noted. Left-sided pacemaker is unchanged in position. Stable mild right apical pneumothorax is noted. Right-sided chest tube is unchanged in position. Stable bibasilar subsegmental atelectasis with associated pleural effusions is noted. Bony thorax is unremarkable. Degenerative changes seen involving the left glenohumeral joint. IMPRESSION: Aortic atherosclerosis. Stable bibasilar subsegmental atelectasis with associated pleural effusions. Stable mild right apical pneumothorax. Right-sided chest tube is unchanged in position. Electronically Signed   By: Marijo Conception, M.D.   On: 04/26/2017 07:46   Dg Chest Port 1 View  Result Date:  04/25/2017 CLINICAL DATA:  Right-sided chest tube.  Follow-up of pneumothorax. EXAM: PORTABLE CHEST 1 VIEW COMPARISON:  04/24/2017 FINDINGS: Dual lead pacer. Midline trachea. Mild cardiomegaly with transverse aortic atherosclerosis. Right hemidiaphragm  elevation. Trace bilateral pleural effusions are similar. Approximately 5-10% right apical pneumothorax identified. Slightly decreased today, with visceral pleural line 10 mm from chest wall versus 13 mm yesterday. right chest tube remains in place. Resolution of congestive heart failure. Bibasilar airspace disease remains. IMPRESSION: Slight decrease in 5-10% right apical pneumothorax. Cardiomegaly with persistent bibasilar atelectasis and probable small bilateral pleural effusions. Electronically Signed   By: Abigail Miyamoto M.D.   On: 04/25/2017 08:58    EKG:   Orders placed or performed during the hospital encounter of 04/18/17  . ED EKG within 10 minutes  . ED EKG within 10 minutes  . EKG 12-Lead  . EKG 12-Lead  . EKG 12-Lead  . EKG 12-Lead  . EKG 12-Lead  . EKG 12-Lead    ASSESSMENT AND PLAN:   83 year old female with past medical history significant for atrial fibrillation, sick sinus syndrome status post pacemaker, hypertension, hyperlipidemia and arthritis who takes eliquis at home for her A. Fib comes secondary to right-sided chest pain and shoulder pain and noted to have right-sided hemothorax.   #1 right-sided hemothorax-secondary to likely spontaneous bullous rupture, and patient on eliquis. -Appreciate thoracic surgery consultation -received K centra -Status post thoracoscopy, evacuation and has right-sided chest tubes. Anterior one is removed, the other with serous drainage- to suction and water seal now -Continue to monitor hemoglobin - Chest x-ray today shows Very small and stable pneumothorax. Wounds redressed. Repeat chest x-ray in a.m. and hoping to remove the chest tube tomorrow by cardiothoracic surgery appreciate Dr. Faith Rogue  recommendations -Follow up with general surgery  #2 acute on chronic anemia-hemoglobin drop secondary to hemothorax. -Hemoglobin at 6.9 will go ahead and transfuse 1 unit of blood and repeat blood work  -will not be discharged on eliquis  #3 atrial fibrillation-changed amiodarone trip to oral amiodarone.  -Patient is status post pacemaker -Changed metoprolol to home dose of Toprol. Heart rate is well controlled -No eliquis for now due to bleeding- discussed with son and patient and they are agreeable until seen by outpatient cardiologist  #4 depression-on Prozac  #5 DVT prophylaxis-Ted's and SCDs   PT following- recommended SNF for now    All the records are reviewed and case discussed with Care Management/Social Workerr. Management plans discussed with the patient, family and they are in agreement.  CODE STATUS: full code  TOTAL TIME TAKING CARE OF THIS PATIENT: 35 minutes.   POSSIBLE D/C TOMORROW, DEPENDING ON CLINICAL CONDITION.   Nicholes Mango M.D on 04/26/2017 at 4:17 PM  Between 7am to 6pm - Pager - [phone]   After 6pm go to [email or web address] - password [facility]  Office  [phone]  CC: Primary care physician; Einar Pheasant, MD

## 2017-04-26 NOTE — Care Management Important Message (Signed)
Important Message  Patient Details  Name: MELORA MENON MRN: [id number] Date of Birth: [birth year]   Medicare Important Message Given:  Yes  Signed IM notice given to patient and son   Mathis Bud 04/26/2017, 11:39 AM

## 2017-04-26 NOTE — Progress Notes (Signed)
Blood infusing per [facility] policy. IV to right hand infiltrated post 15 min vitals.  Blood infusing paused. New IV to left AC inserted by this Probation officer. IV flushes, blood return noted.

## 2017-04-26 NOTE — Progress Notes (Signed)
One unit of PRBC  Completed. VS obtained, no adverse reaction to blood. Will continue to monitor.

## 2017-04-26 NOTE — Progress Notes (Signed)
PT Cancellation Note  Patient Details Name: CING Baywood MRN: [id number] DOB: 1928-07-09   Cancelled Treatment:    Reason Eval/Treat Not Completed: Medical issues which prohibited therapy.  Pt's Hgb noted to be 6.9 this morning.  Per PT guidelines for low Hgb, will hold PT at this time.  Nursing reports plan for blood transfusion today.  Will re-attempt PT treatment tomorrow as medically appropriate.  Leitha Bleak, PT 04/26/17, 2:57 PM [phone]

## 2017-04-27 ENCOUNTER — Inpatient Hospital Stay: Payer: Medicare Other

## 2017-04-27 LAB — TYPE AND SCREEN
ABO/RH(D): O POS
ANTIBODY SCREEN: NEGATIVE
Unit division: 0

## 2017-04-27 LAB — BASIC METABOLIC PANEL
Anion gap: 7 (ref 5–15)
BUN: 18 mg/dL (ref 6–20)
CHLORIDE: 106 mmol/L (ref 101–111)
CO2: 25 mmol/L (ref 22–32)
CREATININE: 0.85 mg/dL (ref 0.44–1.00)
Calcium: 8.6 mg/dL — ABNORMAL LOW (ref 8.9–10.3)
GFR calc Af Amer: >60 mL/min (ref >60)
GFR calc non Af Amer: >60 mL/min (ref >60)
GLUCOSE: 96 mg/dL (ref 65–99)
Potassium: 4 mmol/L (ref 3.5–5.1)
SODIUM: 138 mmol/L (ref 135–145)

## 2017-04-27 LAB — CBC
HCT: 25.5 % — ABNORMAL LOW (ref 35.0–47.0)
HEMOGLOBIN: 8.6 g/dL — AB (ref 12.0–16.0)
MCH: 27.8 pg (ref 26.0–34.0)
MCHC: 33.7 g/dL (ref 32.0–36.0)
MCV: 82.4 fL (ref 80.0–100.0)
PLATELETS: 294 10*3/uL (ref 150–440)
RBC: 3.1 MIL/uL — ABNORMAL LOW (ref 3.80–5.20)
RDW: 15.6 % — ABNORMAL HIGH (ref 11.5–14.5)
WBC: 9.1 10*3/uL (ref 3.6–11.0)

## 2017-04-27 LAB — BPAM RBC
Blood Product Expiration Date: 201810092359
ISSUE DATE / TIME: 201809171503
UNIT TYPE AND RH: 5100

## 2017-04-27 MED ORDER — SODIUM CHLORIDE 0.9% FLUSH
3.0000 mL | Freq: Two times a day (BID) | INTRAVENOUS | Status: DC
  Administered 2017-04-27 – 2017-04-28 (×2): 3 mL via INTRAVENOUS

## 2017-04-27 NOTE — Progress Notes (Signed)
[place] at [place] NAME: Kristin Avila    MR#:  [id number]  DATE OF BIRTH:  [birth year]  SUBJECTIVE:  CHIEF COMPLAINT:   Chief Complaint  Patient presents with  . Shoulder Pain   - resting comfortably Chest tube for suction.Denies any shortness of breath. Son at bedside  REVIEW OF SYSTEMS:  Review of Systems  Constitutional: Positive for malaise/fatigue. Negative for chills and fever.  HENT: Positive for hearing loss. Negative for congestion, ear discharge and nosebleeds.   Eyes: Negative for blurred vision and double vision.  Respiratory: Negative for cough, shortness of breath and wheezing.   Cardiovascular: Negative for chest pain, palpitations and leg swelling.  Gastrointestinal: Negative for abdominal pain, constipation, diarrhea, nausea and vomiting.  Genitourinary: Negative for dysuria.  Musculoskeletal: Positive for myalgias. Negative for back pain.  Neurological: Negative for dizziness, seizures and headaches.    DRUG ALLERGIES:  No Known Allergies  VITALS:  Blood pressure (!) 146/87, pulse 70, temperature 97.8 F (36.6 C), temperature source Oral, resp. rate 18, height 5\' 2"  (1.575 m), weight 77.3 kg (170 lb 6.4 oz), SpO2 98 %.  PHYSICAL EXAMINATION:  Physical Exam  GENERAL:  83 y.o.-year-old patient lying in the bed with no acute distress. Hard of hearing EYES: Pupils equal, round, reactive to light and accommodation. No scleral icterus. Extraocular muscles intact.  HEENT: Head atraumatic, normocephalic. Oropharynx and nasopharynx clear.  NECK:  Supple, no jugular venous distention. No thyroid enlargement, no tenderness.  LUNGS: Normal breath sounds bilaterally, no wheezing, rales,rhonchi or crepitation. No use of accessory muscles of respiration. decreased right basilar breath sounds Right-sided chest tube is noted CARDIOVASCULAR: S1, S2 normal. No rubs, or gallops. 2/6 systolic murmur present ABDOMEN: Soft,  nontender, nondistended. Bowel sounds present. No organomegaly or mass.  EXTREMITIES: No pedal edema, cyanosis, or clubbing.  NEUROLOGIC: Cranial nerves II through XII are intact. Muscle strength 5/5 in all extremities. Sensation intact. Gait not checked.  PSYCHIATRIC: The patient is alert and oriented x 3.  SKIN: No obvious rash, lesion, or ulcer.    LABORATORY PANEL:   CBC  Recent Labs Lab 04/27/17 0730  WBC 9.1  HGB 8.6*  HCT 25.5*  PLT 294   ------------------------------------------------------------------------------------------------------------------  Chemistries   Recent Labs Lab 04/27/17 0730  NA 138  K 4.0  CL 106  CO2 25  GLUCOSE 96  BUN 18  CREATININE 0.85  CALCIUM 8.6*   ------------------------------------------------------------------------------------------------------------------  Cardiac Enzymes No results for input(s): TROPONINI in the last 168 hours. ------------------------------------------------------------------------------------------------------------------  RADIOLOGY:  Dg Chest 2 View  Result Date: 04/27/2017 CLINICAL DATA:  Post right thoracotomy for evacuation of hemothorax 1 week ago EXAM: CHEST  2 VIEW COMPARISON:  Chest x-ray of 04/26/2017 FINDINGS: This still appears to be a tiny right apical pneumothorax present with right chest tube noted. Small left pleural effusion remains. Cardiomegaly is stable. Permanent pacemaker is unchanged. IMPRESSION: 1. Little change in tiny right apical pneumothorax. Right chest tube remains. 2. Small left pleural effusion is unchanged. Stable cardiomegaly with pacer. Electronically Signed   By: Ivar Drape M.D.   On: 04/27/2017 08:13   Dg Chest Port 1 View  Result Date: 04/26/2017 CLINICAL DATA:  Status post right thoracoscopy. EXAM: PORTABLE CHEST 1 VIEW COMPARISON:  Radiograph of April 25, 2017. FINDINGS: Stable cardiomegaly. Atherosclerosis of thoracic aorta is noted. Left-sided pacemaker is  unchanged in position. Stable mild right apical pneumothorax is noted. Right-sided chest tube is unchanged in position. Stable bibasilar  subsegmental atelectasis with associated pleural effusions is noted. Bony thorax is unremarkable. Degenerative changes seen involving the left glenohumeral joint. IMPRESSION: Aortic atherosclerosis. Stable bibasilar subsegmental atelectasis with associated pleural effusions. Stable mild right apical pneumothorax. Right-sided chest tube is unchanged in position. Electronically Signed   By: Marijo Conception, M.D.   On: 04/26/2017 07:46    EKG:   Orders placed or performed during the hospital encounter of 04/18/17  . ED EKG within 10 minutes  . ED EKG within 10 minutes  . EKG 12-Lead  . EKG 12-Lead  . EKG 12-Lead  . EKG 12-Lead  . EKG 12-Lead  . EKG 12-Lead    ASSESSMENT AND PLAN:   83 year old female with past medical history significant for atrial fibrillation, sick sinus syndrome status post pacemaker, hypertension, hyperlipidemia and arthritis who takes eliquis at home for her A. Fib comes secondary to right-sided chest pain and shoulder pain and noted to have right-sided hemothorax.   #1 right-sided hemothorax-secondary to likely spontaneous bullous rupture, and patient on eliquis. -Appreciate thoracic surgery consultation -received K centra -Status post thoracoscopy, evacuation and has right-sided chest tubes. Anterior one is removed, the other with serous drainage- to suction and water seal now -Continue to monitor hemoglobin - Chest x-ray today shows Very small  Pneumothorax with small air leak.Repeat chest x-ray in a.m. and hoping to remove the chest tube tomorrow by cardiothoracic surgery appreciate Dr. Faith Rogue recommendations -Follow up with general surgery  #2 acute on chronic anemia-hemoglobin drop secondary to hemothorax. -Hemoglobin at 6.9 will go ahead and transfuse 1 unit of blood and repeat blood work  -will not be discharged on  eliquis  #3 atrial fibrillation-changed amiodarone trip to oral amiodarone.  -Patient is status post pacemaker -Changed metoprolol to home dose of Toprol. Heart rate is well controlled -No eliquis for now due to bleeding- discussed with son and patient and they are agreeable until seen by outpatient cardiologist  #4 depression-on Prozac  #5 DVT prophylaxis-Ted's and SCDs   PT following- recommended SNF for now. Follow-up with social worker    All the records are reviewed and case discussed with Care Management/Social Workerr. Management plans discussed with the patient, son at bedside and they are in agreement.  CODE STATUS: full code  TOTAL TIME TAKING CARE OF THIS PATIENT:28 minutes.   POSSIBLE D/C TOMORROW, DEPENDING ON CLINICAL CONDITION.   Nicholes Mango M.D on 04/27/2017 at 10:04 AM  Between 7am to 6pm - Pager - [phone]   After 6pm go to [email or web address] - password [facility]  Office  [phone]  CC: Primary care physician; Einar Pheasant, MD

## 2017-04-27 NOTE — Progress Notes (Signed)
Physical Therapy Treatment Patient Details Name: Kristin Avila MRN: [id number] DOB: 10-10-1927 Today's Date: 04/27/2017    History of Present Illness Pt admitted for complaints of chest pain and shoulder pain. Pt is now s/p R thoracoscopy and evacuation of R hemothorax. Pt history includes anxiety, HTN, and Afib. Pt with hip pain this date, however imaging negative at this time. Pt now has 3 chest tubes presents. Pt with HR WNL this date. Chest tube to water seal.    PT Comments    Pt reported feeling better, ready to walk.  To edge of bed with min assist and verbal cues for hand placements.  Stood with min assist.  She was able to ambulate 45' x 1 with walker and min assist due to unsteadiness with recliner follow.  Pt stated she felt "woozy" in her head and legs.  She requested to sit and rest then try to walk again but upon standing she felt "woozy" again and encouraged to sit by Probation officer.  Stated she felt better sitting.  Overall tolerated well with increased activity tolerance today.  May want to check orthostatic vitals next session.   Follow Up Recommendations  SNF     Equipment Recommendations  Rolling walker with 5" wheels    Recommendations for Other Services       Precautions / Restrictions Precautions Precautions: Fall Restrictions Weight Bearing Restrictions: No    Mobility  Bed Mobility Overal bed mobility: Needs Assistance Bed Mobility: Supine to Sit Rolling: Min guard   Supine to sit: Min assist     General bed mobility comments: verbal cues for hand placements for effective bed mobility.  Transfers Overall transfer level: Needs assistance Equipment used: Rolling walker (2 wheeled) Transfers: Sit to/from Stand Sit to Stand: Min guard         General transfer comment: verbal cues for hand placmements  Ambulation/Gait Ambulation/Gait assistance: Min assist;+2 safety/equipment Ambulation Distance (Feet): 45 Feet Assistive device: Rolling walker  (2 wheeled) Gait Pattern/deviations: Step-through pattern Gait velocity: reduced Gait velocity interpretation: <1.8 ft/sec, indicative of risk for recurrent falls General Gait Details: generally unsteady, pt reports feeling "wobbly"   Stairs            Wheelchair Mobility    Modified Rankin (Stroke Patients Only)       Balance Overall balance assessment: Needs assistance Sitting-balance support: Feet supported;Bilateral upper extremity supported Sitting balance-Leahy Scale: Fair     Standing balance support: Bilateral upper extremity supported;During functional activity Standing balance-Leahy Scale: Poor                              Cognition Arousal/Alertness: Awake/alert Behavior During Therapy: WFL for tasks assessed/performed Overall Cognitive Status: Within Functional Limits for tasks assessed                                 General Comments: some word finding difficulties noted      Exercises Other Exercises Other Exercises: BLE ankle pumps, heel slides, SLR, ab/adduction x 10 AAROM RLE, AROM LLE supine, marches and toe raises in standing x 10    General Comments        Pertinent Vitals/Pain Pain Assessment: No/denies pain Pain Score: 2  Pain Location: R Le thigh Pain Descriptors / Indicators: Sore Pain Intervention(s): Limited activity within patient's tolerance    Home Living  Prior Function            PT Goals (current goals can now be found in the care plan section) Progress towards PT goals: Progressing toward goals    Frequency    Min 2X/week      PT Plan Current plan remains appropriate    Co-evaluation              AM-PAC PT "6 Clicks" Daily Activity  Outcome Measure  Difficulty turning over in bed (including adjusting bedclothes, sheets and blankets)?: A Little Difficulty moving from lying on back to sitting on the side of the bed? : A Little Difficulty sitting  down on and standing up from a chair with arms (e.g., wheelchair, bedside commode, etc,.)?: Unable Help needed moving to and from a bed to chair (including a wheelchair)?: A Little Help needed walking in hospital room?: A Lot Help needed climbing 3-5 steps with a railing? : A Lot 6 Click Score: 14    End of Session Equipment Utilized During Treatment: Gait belt Activity Tolerance: Patient tolerated treatment well;Patient limited by fatigue Patient left: in chair;with chair alarm set;with call bell/phone within reach Nurse Communication: Other (comment)       Time: 9326-7124 PT Time Calculation (min) (ACUTE ONLY): 14 min  Charges:  $Gait Training: 8-22 mins $Therapeutic Exercise: 8-22 mins                    G Codes:       Chesley Noon, PTA 04/27/17, 11:43 AM

## 2017-04-27 NOTE — Progress Notes (Signed)
No complaints today.  Did not walk yesterday but feels like she could  Not short of breath and no chest pain  CT drainage not recorded but still draining just serous fluid  CXRay from today independently reviewed - small unchanged right apical pneumothorax  Small air leak today with vigorous cough.  I did not see this yesterday. Lungs clear Heart regular  Will leave on water seal today Repeat CXray in the morning Not ready for discharge yet.  [employer].

## 2017-04-27 NOTE — Clinical Social Work Note (Signed)
CSW spoke to [facility], and they are not able to accept patient with a chest tube.  CSW was informed that patient's chest tube will have to be removed before she is able to discharge to SNF.  Jones Broom. [place], MSW, [place]  04/27/2017 11:56 AM

## 2017-04-28 ENCOUNTER — Telehealth: Payer: Self-pay | Admitting: *Deleted

## 2017-04-28 ENCOUNTER — Inpatient Hospital Stay: Payer: Medicare Other

## 2017-04-28 LAB — CBC
HEMATOCRIT: 26.8 % — AB (ref 35.0–47.0)
Hemoglobin: 8.7 g/dL — ABNORMAL LOW (ref 12.0–16.0)
MCH: 27.2 pg (ref 26.0–34.0)
MCHC: 32.7 g/dL (ref 32.0–36.0)
MCV: 83.1 fL (ref 80.0–100.0)
Platelets: 320 10*3/uL (ref 150–440)
RBC: 3.22 MIL/uL — ABNORMAL LOW (ref 3.80–5.20)
RDW: 15.6 % — AB (ref 11.5–14.5)
WBC: 8.7 10*3/uL (ref 3.6–11.0)

## 2017-04-28 MED ORDER — AMIODARONE HCL 200 MG PO TABS: 200.0000 mg | ORAL_TABLET | Freq: Two times a day (BID) | ORAL | Status: DC

## 2017-04-28 MED ORDER — TRAZODONE HCL 50 MG PO TABS: 50.0000 mg | ORAL_TABLET | Freq: Every evening | ORAL | Status: DC | PRN

## 2017-04-28 MED ORDER — FUROSEMIDE 10 MG/ML IJ SOLN
20.0000 mg | Freq: Once | INTRAMUSCULAR | Status: AC
  Administered 2017-04-28: 20 mg via INTRAVENOUS
  Filled 2017-04-28: qty 2

## 2017-04-28 MED ORDER — BISACODYL 5 MG PO TBEC: 5.0000 mg | DELAYED_RELEASE_TABLET | Freq: Every day | ORAL | 0 refills | Status: AC | PRN

## 2017-04-28 MED ORDER — PNEUMOCOCCAL VAC POLYVALENT 25 MCG/0.5ML IJ INJ: 0.5000 mL | INJECTION | INTRAMUSCULAR | 0 refills | Status: AC

## 2017-04-28 MED ORDER — HYDROCODONE-ACETAMINOPHEN 5-325 MG PO TABS: 1.0000 | ORAL_TABLET | Freq: Four times a day (QID) | ORAL | 0 refills | Status: DC | PRN

## 2017-04-28 MED ORDER — ALBUTEROL SULFATE (2.5 MG/3ML) 0.083% IN NEBU: 2.5000 mg | INHALATION_SOLUTION | RESPIRATORY_TRACT | 12 refills | Status: DC | PRN

## 2017-04-28 NOTE — Progress Notes (Signed)
Sitting in chair. Feels well. No complaints.  Questionable air leak with vigorous cough. May be related to tied living in the tubes.  Independent review of chest x-ray this morning shows trace pneumothorax.  I have clamped the chest tube and will repeat the chest x-ray this afternoon. If there is no change I will remove the chest tube.

## 2017-04-28 NOTE — Progress Notes (Signed)
Chest Xray looked OK with chest tube clamped for 5 hours so I removed it.  Please have patient followup with me in one week.  [employer].

## 2017-04-28 NOTE — Telephone Encounter (Signed)
Pt will disharge from [facility] on 09/19. Pt will need a HFU with Dr. Nicki Reaper

## 2017-04-28 NOTE — Progress Notes (Signed)
Report called to [place] at [facility]. IV and tele removed. Discharge instructions in packet.

## 2017-04-28 NOTE — Plan of Care (Signed)
Problem: Pain Managment: Goal: General experience of comfort will improve Outcome: Progressing Complaints of intermittent pain to left leg.  Prn pain meds offered.

## 2017-04-28 NOTE — Discharge Instructions (Signed)
Stop Coumadin Follow-up with primary care physician in 3-4 days Follow-up with cardiology Dr. Nehemiah Massed in a week Follow-up with cardiac thoracic surgery Dr. Faith Rogue in 1 week

## 2017-04-28 NOTE — Progress Notes (Signed)
Family was updated on transfer to [facility]. No further concerns at this time.

## 2017-04-28 NOTE — Progress Notes (Signed)
Physical Therapy Treatment Patient Details Name: Kristin Avila MRN: [id number] DOB: 02/18/1928 Today's Date: 04/28/2017    History of Present Illness Pt admitted for complaints of chest pain and shoulder pain. Pt is now s/p R thoracoscopy and evacuation of R hemothorax. Pt history includes anxiety, HTN, and Afib. Pt with hip pain this date, however imaging negative at this time. Pt now has 3 chest tubes presents. Pt with HR WNL this date. Chest tube to water seal.    PT Comments    Pt is making good progress towards goals. Pt performed seated there-ex, amb 80 ft from recliner to hallway and back to room with RW with CGA. Had chair follow since patient reported slight dizziness upon standing however pt did not need to take a break. Once in room, took pt to bedside commode and pt able to perform toileting tasks, then amb back to recliner. Pt appeared tired today, however was motivated to participate in all PT activities.    Follow Up Recommendations  SNF     Equipment Recommendations  Rolling walker with 5" wheels    Recommendations for Other Services       Precautions / Restrictions Precautions Precautions: Fall Restrictions Weight Bearing Restrictions: No    Mobility  Bed Mobility               General bed mobility comments: Pt received in recliner, did not perform bed mobility.   Transfers Overall transfer level: Needs assistance Equipment used: Rolling walker (2 wheeled) Transfers: Sit to/from Stand Sit to Stand: Min guard         General transfer comment: Pt able to sit to stand from recliner, required cues for hand placements.   Ambulation/Gait Ambulation/Gait assistance: Min assist Ambulation Distance (Feet): 80 Feet Assistive device: Rolling walker (2 wheeled) Gait Pattern/deviations: Step-through pattern     General Gait Details: Pt takes small steps, able to keep RW moving forward while amb, requires cues for turning around and keeping RW centered  in front of body   Stairs            Wheelchair Mobility    Modified Rankin (Stroke Patients Only)       Balance Overall balance assessment: Needs assistance Sitting-balance support: Feet supported Sitting balance-Leahy Scale: Good Sitting balance - Comments: Pt able to sit at recliner with feet on floor, BP in seated 118/53   Standing balance support: Bilateral upper extremity supported Standing balance-Leahy Scale: Good Standing balance comment: Pt able to stand with RW, reported being a little dizzy, able to maintain position while measuring BP, 113/67                            Cognition Arousal/Alertness: Awake/alert Behavior During Therapy: WFL for tasks assessed/performed Overall Cognitive Status: Within Functional Limits for tasks assessed                                        Exercises Other Exercises Other Exercises: Seated ther-ex 10x, alternating marches, B LAQs, B heel lifts, B arm raises  Other Exercises: Took patient to bedside commode    General Comments        Pertinent Vitals/Pain Pain Assessment: No/denies pain    Home Living                      Prior  Function            PT Goals (current goals can now be found in the care plan section) Acute Rehab PT Goals Patient Stated Goal: to return home PT Goal Formulation: With patient Time For Goal Achievement: 05/04/17 Potential to Achieve Goals: Good Progress towards PT goals: Progressing toward goals    Frequency    Min 2X/week      PT Plan Current plan remains appropriate    Co-evaluation              AM-PAC PT "6 Clicks" Daily Activity  Outcome Measure  Difficulty turning over in bed (including adjusting bedclothes, sheets and blankets)?: None Difficulty moving from lying on back to sitting on the side of the bed? : None Difficulty sitting down on and standing up from a chair with arms (e.g., wheelchair, bedside commode, etc,.)?:  Unable Help needed moving to and from a bed to chair (including a wheelchair)?: A Little Help needed walking in hospital room?: A Little Help needed climbing 3-5 steps with a railing? : A Lot 6 Click Score: 17    End of Session Equipment Utilized During Treatment: Gait belt;Oxygen (4L oxygen) Activity Tolerance: Patient tolerated treatment well;Patient limited by fatigue Patient left: in chair;with call bell/phone within reach;with chair alarm set   PT Visit Diagnosis: Unsteadiness on feet (R26.81);Other abnormalities of gait and mobility (R26.89);Muscle weakness (generalized) (M62.81)     Time: 0321-2248 PT Time Calculation (min) (ACUTE ONLY): 32 min  Charges:                       G Codes:  Functional Assessment Tool Used: AM-PAC 6 Clicks Basic Mobility Functional Limitation: Mobility: Walking and moving around Mobility: Walking and Moving Around Current Status (G5003): At least 40 percent but less than 60 percent impaired, limited or restricted Mobility: Walking and Moving Around Goal Status [phone]): At least 20 percent but less than 40 percent impaired, limited or restricted    Manfred Arch, SPT   Manfred Arch 04/28/2017, 3:08 PM

## 2017-04-28 NOTE — Discharge Summary (Signed)
[facility] [place] NAME: Kristin Avila    MR#:  [id number]  DATE OF BIRTH:  12-13-1927  DATE OF ADMISSION:  04/18/2017 ADMITTING PHYSICIAN: Demetrios Loll, MD  DATE OF DISCHARGE:  04/28/17 PRIMARY CARE PHYSICIAN: Einar Pheasant, MD    ADMISSION DIAGNOSIS:  Pleural effusion [J90] Elevated troponin [R74.8] Atrial fibrillation with rapid ventricular response ([place]) [I48.91] Chest pain [R07.9] Hypotension [I95.9]  DISCHARGE DIAGNOSIS:  Active Problems:   Pleural effusion on right   Hypotension Hemothorax  SECONDARY DIAGNOSIS:   Past Medical History:  Diagnosis Date  . Anemia   . Anxiety 10/02/2012  . Atrial fibrillation ([place]) 11/13   new onset 11/13  . BCC (basal cell carcinoma), face 04/24/2016  . Benign essential hypertension 03/13/2015  . Cellulitis of right upper extremity 01/27/2017  . Constipation 01/13/2015  . Decreased hearing 05/21/2015  . Health care maintenance 10/14/2014   Mammogram 10/01/15 - [facility] I.    . Hypercholesterolemia   . Hyperlipidemia, mixed 02/01/2017  . Hypertension   . Osteoarthritis    hands, feet  . Sick sinus syndrome ([place]) 01/04/2017   Overview:  Sp pacer placement 2018  . Syncope 12/08/2016    HOSPITAL COURSE:   hpi Kristin Avila  is a 83 y.o. female with a known history of A. Fib, sick sinus syndrome, status post pacemaker placement, hypertension, hyperlipidemia and osteoarthritis. The patient presented to the ED with bilateral shoulder pain, right greater than left this morning. She was fine until this morning. Shoulder pain is extubated by movement. She denies any fever or chills, no cough, wheezing or shortness of breath. She also complains of generalized body aching. Her daughter states the patient had chills over in the morning. Chest x-ray show large right-sided pleural effusion, which is new compared to chest x-ray in May. She was found A. Fib with RVR, given small bolus Cardizem in the ED.  Heart rate is controlled, but blood pressure decreased to 80s.  #1 right-sided hemothorax-secondary to likely spontaneous bullous rupture, and patient on eliquis. -Appreciate thoracic surgery consultation -received K centra -Status post thoracoscopy, evacuation and has right-sided chest tubes. Anterior one is removed, the other right-sided chest tube clamped for 5 hours, repeat chest x-ray stable, chest tube removed -Continue to monitor hemoglobin -Follow up with cardiothoracic surgeon Dr. Faith Rogue in 1 week after discharge  #2 acute on chronic anemia-hemoglobin drop secondary to hemothorax. -Hemoglobin at 6.9 will go ahead and transfuse 1 unit of blood and repeat blood work  Hb 8.7 -Stop eliquis, outpatient follow-up with cardiology  #3 atrial fibrillation-changed amiodarone trip to oral amiodarone.  -Patient is status post pacemaker. Amiodarone is added to the regimen -Changed metoprolol to home dose of Toprol. Heart rate is well controlled -No eliquis for now due to bleeding- discussed with son and patient and they are agreeable until seen by outpatient cardiac follow-up is recommended  #4 depression-on Prozac  # Hypertension blood pressure is soft and discontinued this no current amlodipine and continue Toprol  #5 DVT prophylaxis-Ted's and SCDs   DISCHARGE CONDITIONS:   fair  CONSULTS OBTAINED:  Treatment Team:  Flora Lipps, MD Teodoro Spray, MD   PROCEDURES chest tube placemnt- removed 04/28/17  DRUG ALLERGIES:  No Known Allergies  DISCHARGE MEDICATIONS:   Current Discharge Medication List    START taking these medications   Details  albuterol (PROVENTIL) (2.5 MG/3ML) 0.083% nebulizer solution Take 3 mLs (2.5 mg total) by nebulization every 2 (two) hours as  needed for wheezing. Qty: 75 mL, Refills: 12    amiodarone (PACERONE) 200 MG tablet Take 1 tablet (200 mg total) by mouth 2 (two) times daily.    bisacodyl (DULCOLAX) 5 MG EC tablet Take 1 tablet (5 mg  total) by mouth daily as needed for moderate constipation. Qty: 30 tablet, Refills: 0    HYDROcodone-acetaminophen (NORCO/VICODIN) 5-325 MG tablet Take 1-2 tablets by mouth every 6 (six) hours as needed for moderate pain. Qty: 15 tablet, Refills: 0    pneumococcal 23 valent vaccine (PNU-IMMUNE) 25 MCG/0.5ML injection Inject 0.5 mLs into the muscle tomorrow at 10 am. Qty: 2.5 mL, Refills: 0      CONTINUE these medications which have CHANGED   Details  traZODone (DESYREL) 50 MG tablet Take 1 tablet (50 mg total) by mouth at bedtime as needed for sleep.      CONTINUE these medications which have NOT CHANGED   Details  acetaminophen (TYLENOL) 325 MG tablet Take 2 tablets (650 mg total) by mouth every 6 (six) hours as needed for mild pain, moderate pain or fever (or Fever >/= 101).    Cholecalciferol (VITAMIN D-3) 1000 UNITS CAPS Take 1 capsule by mouth daily.     FLUoxetine (PROZAC) 40 MG capsule Take 1 capsule (40 mg total) by mouth daily. Qty: 90 capsule, Refills: 1    lovastatin (MEVACOR) 20 MG tablet Take 1 tablet (20 mg total) by mouth daily. Qty: 90 tablet, Refills: 1    metoprolol succinate (TOPROL-XL) 50 MG 24 hr tablet TAKE (1) TABLET BY MOUTH DAILY. TAKE WITH OR IMMEDIATELY FOLLOWING A MEAL Qty: 30 tablet, Refills: 10    senna-docusate (SENOKOT-S) 8.6-50 MG tablet Take 1 tablet by mouth at bedtime as needed for mild constipation.      STOP taking these medications     amLODipine (NORVASC) 10 MG tablet      ELIQUIS 5 MG TABS tablet      hydrochlorothiazide (HYDRODIURIL) 25 MG tablet      lisinopril (PRINIVIL,ZESTRIL) 40 MG tablet      naproxen sodium (ANAPROX) 220 MG tablet          DISCHARGE INSTRUCTIONS:   Stop Coumadin Follow-up with primary care physician in 3-4 days Follow-up with cardiology Dr. Nehemiah Massed in a week Follow-up with cardiac thoracic surgery Dr. Faith Rogue in 1 week   DIET:  Cardiac diet  DISCHARGE CONDITION:  Fair  ACTIVITY:  Activity  as tolerated  OXYGEN:  Home Oxygen: No.   Oxygen Delivery: room air  DISCHARGE LOCATION:  nursing home   If you experience worsening of your admission symptoms, develop shortness of breath, life threatening emergency, suicidal or homicidal thoughts you must seek medical attention immediately by calling 911 or calling your MD immediately  if symptoms less severe.  You Must read complete instructions/literature along with all the possible adverse reactions/side effects for all the Medicines you take and that have been prescribed to you. Take any new Medicines after you have completely understood and accpet all the possible adverse reactions/side effects.   Please note  You were cared for by a hospitalist during your hospital stay. If you have any questions about your discharge medications or the care you received while you were in the hospital after you are discharged, you can call the unit and asked to speak with the hospitalist on call if the hospitalist that took care of you is not available. Once you are discharged, your primary care physician will handle any further medical issues. Please note  that NO REFILLS for any discharge medications will be authorized once you are discharged, as it is imperative that you return to your primary care physician (or establish a relationship with a primary care physician if you do not have one) for your aftercare needs so that they can reassess your need for medications and monitor your lab values.     Today  Chief Complaint  Patient presents with  . Shoulder Pain   Patient is resting comfortably. Out of bed to chair. Chest tube clamped and removed patient denies any shortness of breath  ROS:  CONSTITUTIONAL: Denies fevers, chills. Denies any fatigue, weakness.  EYES: Denies blurry vision, double vision, eye pain. EARS, NOSE, THROAT: Denies tinnitus, ear pain, hearing loss. RESPIRATORY: Denies cough, wheeze, shortness of breath.  CARDIOVASCULAR:  Denies chest pain, palpitations, edema.  GASTROINTESTINAL: Denies nausea, vomiting, diarrhea, abdominal pain. Denies bright red blood per rectum. GENITOURINARY: Denies dysuria, hematuria. ENDOCRINE: Denies nocturia or thyroid problems. HEMATOLOGIC AND LYMPHATIC: Denies easy bruising or bleeding. SKIN: Denies rash or lesion. MUSCULOSKELETAL: Denies pain in neck, back, shoulder, knees, hips or arthritic symptoms.  NEUROLOGIC: Denies paralysis, paresthesias.  PSYCHIATRIC: Denies anxiety or depressive symptoms.   VITAL SIGNS:  Blood pressure (!) 118/53, pulse 66, temperature 97.6 F (36.4 C), temperature source Oral, resp. rate 18, height 5\' 2"  (1.575 m), weight 77.3 kg (170 lb 6.4 oz), SpO2 100 %.  I/O:    Intake/Output Summary (Last 24 hours) at 04/28/17 1415 Last data filed at 04/28/17 1408  Gross per 24 hour  Intake              600 ml  Output             1255 ml  Net             -655 ml    PHYSICAL EXAMINATION:  GENERAL:  83 y.o.-year-old patient lying in the bed with no acute distress.  EYES: Pupils equal, round, reactive to light and accommodation. No scleral icterus. Extraocular muscles intact.  HEENT: Head atraumatic, normocephalic. Oropharynx and nasopharynx clear.  NECK:  Supple, no jugular venous distention. No thyroid enlargement, no tenderness.  LUNGS: Normal breath sounds bilaterally, no wheezing, rales,rhonchi or crepitation. No use of accessory muscles of respiration. Right-sided chest tube is removed and clean dressing applied CARDIOVASCULAR: S1, S2 normal. No murmurs, rubs, or gallops.  ABDOMEN: Soft, non-tender, non-distended. Bowel sounds present. No organomegaly or mass.  EXTREMITIES: No pedal edema, cyanosis, or clubbing.  NEUROLOGIC: Cranial nerves II through XII are intact. Muscle strength   diffusely diminished in all extremities. Sensation intact. Gait not checked.  PSYCHIATRIC: The patient is alert and oriented x 3.  SKIN: No obvious rash, lesion, or  ulcer.   DATA REVIEW:   CBC  Recent Labs Lab 04/28/17 0835  WBC 8.7  HGB 8.7*  HCT 26.8*  PLT 320    Chemistries   Recent Labs Lab 04/27/17 0730  NA 138  K 4.0  CL 106  CO2 25  GLUCOSE 96  BUN 18  CREATININE 0.85  CALCIUM 8.6*    Cardiac Enzymes No results for input(s): TROPONINI in the last 168 hours.  Microbiology Results  Results for orders placed or performed during the hospital encounter of 04/18/17  MRSA PCR Screening     Status: None   Collection Time: 04/18/17 10:43 AM  Result Value Ref Range Status   MRSA by PCR NEGATIVE NEGATIVE Final    Comment:  The GeneXpert MRSA Assay (FDA approved for NASAL specimens only), is one component of a comprehensive MRSA colonization surveillance program. It is not intended to diagnose MRSA infection nor to guide or monitor treatment for MRSA infections.   Body fluid culture     Status: None   Collection Time: 04/19/17 11:30 AM  Result Value Ref Range Status   Specimen Description PLEURAL  Final   Special Requests NONE  Final   Gram Stain   Final    MODERATE WBC PRESENT, PREDOMINANTLY PMN NO ORGANISMS SEEN GROSSLY BLOODY    Culture   Final    NO GROWTH 3 DAYS Performed at [facility], [place] [address] [place], [place] [address]    Report Status 04/23/2017 FINAL  Final  Fungus Culture With Stain     Status: None (Preliminary result)   Collection Time: 04/19/17 11:30 AM  Result Value Ref Range Status   Fungus Stain Final report  Final    Comment: (NOTE) Performed At: [facility] [place], [place] [id number] Lindon Romp MD [id number]    Fungus (Mycology) Culture PENDING  Incomplete   Fungal Source PLEURAL  Final  Acid Fast Smear (AFB)     Status: None   Collection Time: 04/19/17 11:30 AM  Result Value Ref Range Status   AFB Specimen Processing Concentration  Final   Acid Fast Smear Negative  Final    Comment: (NOTE) Performed At: [facility] [place], [place] [id number] Lindon Romp MD [id number]    Source (AFB) PLEURAL  Final  Fungus Culture Result     Status: None   Collection Time: 04/19/17 11:30 AM  Result Value Ref Range Status   Result 1 Comment  Final    Comment: (NOTE) KOH/Calcofluor preparation:  no fungus observed. Performed At: [facility] [address] [place], [place] [id number] Lindon Romp MD [id number]     RADIOLOGY:  Dg Chest 2 View  Result Date: 04/27/2017 CLINICAL DATA:  Post right thoracotomy for evacuation of hemothorax 1 week ago EXAM: CHEST  2 VIEW COMPARISON:  Chest x-ray of 04/26/2017 FINDINGS: This still appears to be a tiny right apical pneumothorax present with right chest tube noted. Small left pleural effusion remains. Cardiomegaly is stable. Permanent pacemaker is unchanged. IMPRESSION: 1. Little change in tiny right apical pneumothorax. Right chest tube remains. 2. Small left pleural effusion is unchanged. Stable cardiomegaly with pacer. Electronically Signed   By: Ivar Drape M.D.   On: 04/27/2017 08:13   Dg Chest Port 1 View  Result Date: 04/28/2017 CLINICAL DATA:  Postop, chest tube clamped EXAM: PORTABLE CHEST 1 VIEW COMPARISON:  04/28/2017 at 0739 hours FINDINGS: Tiny right apical pneumothorax, unchanged.  Stable right chest tube. Small left pleural effusion.  No frank interstitial edema. The heart is normal in size.  Left subclavian pacemaker. IMPRESSION: Tiny right apical pneumothorax, unchanged.  Stable right chest tube. Electronically Signed   By: Julian Hy M.D.   On: 04/28/2017 14:09   Dg Chest Port 1 View  Result Date: 04/28/2017 CLINICAL DATA:  Right pneumothorax. EXAM: PORTABLE CHEST 1 VIEW COMPARISON:  04/27/2017 FINDINGS: Single right-sided chest tube remains in place. Tiny residual right apical pneumothorax is essentially unchanged. There is increased accentuation of the interstitial markings at the lung bases suggesting  mild interstitial edema. Chronic cardiomegaly. Pacemaker in place. Small persistent left effusion. No appreciable right effusion. Blunting of the right costophrenic angle laterally. IMPRESSION: No change in tiny  right apical pneumothorax. New interstitial accentuation suggesting mild pulmonary edema. Small left effusion, stable. Electronically Signed   By: Lorriane Shire M.D.   On: 04/28/2017 08:29   Dg Chest Port 1 View  Result Date: 04/26/2017 CLINICAL DATA:  Status post right thoracoscopy. EXAM: PORTABLE CHEST 1 VIEW COMPARISON:  Radiograph of April 25, 2017. FINDINGS: Stable cardiomegaly. Atherosclerosis of thoracic aorta is noted. Left-sided pacemaker is unchanged in position. Stable mild right apical pneumothorax is noted. Right-sided chest tube is unchanged in position. Stable bibasilar subsegmental atelectasis with associated pleural effusions is noted. Bony thorax is unremarkable. Degenerative changes seen involving the left glenohumeral joint. IMPRESSION: Aortic atherosclerosis. Stable bibasilar subsegmental atelectasis with associated pleural effusions. Stable mild right apical pneumothorax. Right-sided chest tube is unchanged in position. Electronically Signed   By: Marijo Conception, M.D.   On: 04/26/2017 07:46   Dg Chest Port 1 View  Result Date: 04/25/2017 CLINICAL DATA:  Right-sided chest tube.  Follow-up of pneumothorax. EXAM: PORTABLE CHEST 1 VIEW COMPARISON:  04/24/2017 FINDINGS: Dual lead pacer. Midline trachea. Mild cardiomegaly with transverse aortic atherosclerosis. Right hemidiaphragm elevation. Trace bilateral pleural effusions are similar. Approximately 5-10% right apical pneumothorax identified. Slightly decreased today, with visceral pleural line 10 mm from chest wall versus 13 mm yesterday. right chest tube remains in place. Resolution of congestive heart failure. Bibasilar airspace disease remains. IMPRESSION: Slight decrease in 5-10% right apical pneumothorax. Cardiomegaly  with persistent bibasilar atelectasis and probable small bilateral pleural effusions. Electronically Signed   By: Abigail Miyamoto M.D.   On: 04/25/2017 08:58    EKG:   Orders placed or performed during the hospital encounter of 04/18/17  . ED EKG within 10 minutes  . ED EKG within 10 minutes  . EKG 12-Lead  . EKG 12-Lead  . EKG 12-Lead  . EKG 12-Lead  . EKG 12-Lead  . EKG 12-Lead      Management plans discussed with the patient, family and they are in agreement.  CODE STATUS:     Code Status Orders        Start     Ordered   04/18/17 1044  Full code  Continuous     04/18/17 1043    Code Status History    Date Active Date Inactive Code Status Order ID Comments User Context   01/27/2017 11:18 PM 01/31/2017  2:31 PM Full Code [id number]  Harvie Bridge, DO Inpatient   12/08/2016 10:14 PM 12/15/2016  3:09 PM Full Code [id number]  Demetrios Loll, MD ED      TOTAL TIME TAKING CARE OF THIS PATIENT: 45  minutes.   Note: This dictation was prepared with Dragon dictation along with smaller phrase technology. Any transcriptional errors that result from this process are unintentional.   @M[facility] @  on 04/28/2017 at 2:15 PM  Between 7am to 6pm - Pager - [phone]  After 6pm go to [email or web address] - password [email or web address] [facility]  Office  [phone]  CC: Primary care physician; Einar Pheasant, MD

## 2017-04-28 NOTE — Clinical Social Work Note (Addendum)
Patient to be d/c'ed today to [facility].  Patient and family agreeable to plans will transport via ems RN to call report to [phone].  CSW spoke to patient's daughter [name] [phone].  CSW also left a message for APS worker Nadara Mustard [phone].    Evette Cristal, MSW, [place]

## 2017-04-28 NOTE — Plan of Care (Signed)
Problem: Health Behavior/Discharge Planning: Goal: Ability to manage health-related needs will improve Outcome: Not Progressing Confused and with auditory hallucinations this shift (hearing Christmas music).  Pt with periods of agitation and removing telemetry leads. Reorientation unsuccessful.

## 2017-04-29 ENCOUNTER — Other Ambulatory Visit: Payer: Self-pay

## 2017-04-29 ENCOUNTER — Telehealth: Payer: Self-pay

## 2017-04-29 DIAGNOSIS — J942 Hemothorax: Secondary | ICD-10-CM

## 2017-04-29 DIAGNOSIS — G9340 Encephalopathy, unspecified: Secondary | ICD-10-CM | POA: Insufficient documentation

## 2017-04-29 MED ORDER — HYDROCODONE-ACETAMINOPHEN 5-325 MG PO TABS: 1.0000 | ORAL_TABLET | Freq: Four times a day (QID) | ORAL | 0 refills | Status: DC | PRN

## 2017-04-29 NOTE — Telephone Encounter (Signed)
Rx sent to [facility] phone : [phone] , fax : [phone]  

## 2017-04-29 NOTE — Telephone Encounter (Signed)
Patient has been admitted to [facility] for SNF services per Social Worker note fro discharge. Will follow . FYI

## 2017-04-29 NOTE — Telephone Encounter (Signed)
Called Randi and left message regarding pt being discharged to [facility] - in case something needed prior to discharge home.

## 2017-04-29 NOTE — Telephone Encounter (Signed)
Spoke with Case Manager at [facility] at this time. She states that patient is doing well but needing a follow-up appointment.

## 2017-04-30 ENCOUNTER — Other Ambulatory Visit
Admission: RE | Admit: 2017-04-30 | Discharge: 2017-04-30 | Disposition: A | Discharge status: Home / Self Care | Payer: Medicare Other | Source: Ambulatory Visit | Attending: Internal Medicine | Admitting: Internal Medicine

## 2017-04-30 ENCOUNTER — Telehealth: Payer: Self-pay

## 2017-04-30 ENCOUNTER — Other Ambulatory Visit: Payer: Self-pay

## 2017-04-30 DIAGNOSIS — D649 Anemia, unspecified: Secondary | ICD-10-CM | POA: Diagnosis present

## 2017-04-30 DIAGNOSIS — I4891 Unspecified atrial fibrillation: Secondary | ICD-10-CM | POA: Insufficient documentation

## 2017-04-30 LAB — CBC WITH DIFFERENTIAL/PLATELET
BASOS PCT: 1 %
Basophils Absolute: 0.1 10*3/uL (ref 0–0.1)
EOS ABS: 0.2 10*3/uL (ref 0–0.7)
Eosinophils Relative: 3 %
HEMATOCRIT: 23.5 % — AB (ref 35.0–47.0)
Hemoglobin: 7.7 g/dL — ABNORMAL LOW (ref 12.0–16.0)
Lymphocytes Relative: 17 %
Lymphs Abs: 1.2 10*3/uL (ref 1.0–3.6)
MCH: 27.1 pg (ref 26.0–34.0)
MCHC: 32.6 g/dL (ref 32.0–36.0)
MCV: 83.2 fL (ref 80.0–100.0)
MONO ABS: 1 10*3/uL — AB (ref 0.2–0.9)
MONOS PCT: 14 %
NEUTROS ABS: 4.8 10*3/uL (ref 1.4–6.5)
Neutrophils Relative %: 65 %
Platelets: 268 10*3/uL (ref 150–440)
RBC: 2.83 MIL/uL — ABNORMAL LOW (ref 3.80–5.20)
RDW: 16 % — AB (ref 11.5–14.5)
WBC: 7.3 10*3/uL (ref 3.6–11.0)

## 2017-04-30 LAB — TSH: TSH: 2.822 u[IU]/mL (ref 0.350–4.500)

## 2017-04-30 NOTE — Telephone Encounter (Signed)
Spoke with Melissa at [facility] and advised her that we need the patient's chest x-ray moved to 9/27 prior to her 1:30 appointment. She asked if we could fax over the orders so that they could up date this. I verbalized understanding and will have the order faxed over.

## 2017-05-01 ENCOUNTER — Other Ambulatory Visit
Admission: RE | Admit: 2017-05-01 | Discharge: 2017-05-01 | Disposition: A | Discharge status: Home / Self Care | Payer: Medicare Other | Source: Skilled Nursing Facility | Attending: Gerontology | Admitting: Gerontology

## 2017-05-01 DIAGNOSIS — D649 Anemia, unspecified: Secondary | ICD-10-CM | POA: Diagnosis present

## 2017-05-01 LAB — CBC WITH DIFFERENTIAL/PLATELET
BASOS ABS: 0.1 10*3/uL (ref 0–0.1)
BASOS PCT: 1 %
EOS ABS: 0.1 10*3/uL (ref 0–0.7)
Eosinophils Relative: 2 %
HCT: 25.3 % — ABNORMAL LOW (ref 35.0–47.0)
Hemoglobin: 8.4 g/dL — ABNORMAL LOW (ref 12.0–16.0)
Lymphocytes Relative: 14 %
Lymphs Abs: 0.9 10*3/uL — ABNORMAL LOW (ref 1.0–3.6)
MCH: 27.9 pg (ref 26.0–34.0)
MCHC: 33.2 g/dL (ref 32.0–36.0)
MCV: 84.2 fL (ref 80.0–100.0)
MONO ABS: 0.8 10*3/uL (ref 0.2–0.9)
MONOS PCT: 11 %
NEUTROS ABS: 4.9 10*3/uL (ref 1.4–6.5)
Neutrophils Relative %: 72 %
PLATELETS: 284 10*3/uL (ref 150–440)
RBC: 3 MIL/uL — ABNORMAL LOW (ref 3.80–5.20)
RDW: 16.1 % — AB (ref 11.5–14.5)
WBC: 6.9 10*3/uL (ref 3.6–11.0)

## 2017-05-03 ENCOUNTER — Telehealth: Payer: Self-pay | Admitting: Cardiothoracic Surgery

## 2017-05-03 NOTE — Telephone Encounter (Signed)
Spoke with Dr. Genevive Bi- Chest X-ray was reviewed. He has no further orders, would like for patient to follow-up on Thursday as scheduled, and gives permission for Nurse Practioner to treat infiltrate as she sees necessary.   Spoke with Benjie Karvonen (Nurse). The above message was given. She verbalizes understanding and will speak with their provider and patient's daughter.

## 2017-05-03 NOTE — Telephone Encounter (Signed)
Call made to [facility] at this time. Line was busy. Will call once again at a later time.

## 2017-05-03 NOTE — Telephone Encounter (Signed)
Kristin Avila is calling from [facility], patient was shortness of breath and they did a chest xray, they did give the patient a shot of lasix. Kristin Avila can be reached at [phone] please call and advice.

## 2017-05-03 NOTE — Telephone Encounter (Signed)
Call returned again to [facility]. Spoke with Benjie Karvonen.   She states that patient had episode of SOB with exertion yesterday. Assessment found rales in all lobes Right greater than Left, CXR was completed and was abnormal. She states that after Chest X-ray resulted, Facility Nurse Practitioner ordered 54mq Lasix as a One time dose and she was started Albuterol q6h prn for SOB. Denies chest pain.   VSS- Rhythm regular 60-70, sats 95% and above.  Benjie Karvonen states that patient appears to be better today but patient's daughter wanted Dr. Genevive Bi to be made aware of this today.

## 2017-05-04 ENCOUNTER — Non-Acute Institutional Stay (SKILLED_NURSING_FACILITY): Payer: Medicare Other | Admitting: Gerontology

## 2017-05-04 ENCOUNTER — Encounter: Payer: Self-pay | Admitting: Gerontology

## 2017-05-04 DIAGNOSIS — R918 Other nonspecific abnormal finding of lung field: Secondary | ICD-10-CM | POA: Diagnosis not present

## 2017-05-04 NOTE — Progress Notes (Signed)
Location:   [facility] Room Number: [phone] Place of Service:  SNF [phone]) Provider:  Toni Arthurs, NP-C  Einar Pheasant, MD  Patient Care Team: Einar Pheasant, MD as PCP - General (Internal Medicine)  Extended Emergency Contact Information Primary Emergency Contact: Osborne Casco of [place] Phone: [phone] Mobile Phone: [phone] Relation: Daughter Secondary Emergency Contact: Ilona Sorrel [place] of [place] Phone: [phone] Relation: Son  Code Status:  FULL Goals of care: Advanced Directive information Advanced Directives 05/03/2017  Does Patient Have a Medical Advance Directive? No  Type of Advance Directive -  Does patient want to make changes to medical advance directive? -  Copy of [place] in Chart? -  Would patient like information on creating a medical advance directive? -     Chief Complaint  Patient presents with  . Medical Management of Chronic Issues    Routine Visit    HPI:  Pt is a 83 y.o. female seen today for an acute visit for follow up on events from over the weekend. Was notified by nursing pt was having SOB with minimal exertion. O2 94% on RA. Crackles heard by nursing in the RLL. Pt denied SOB with rest. Obtained CXR- showed RLL infiltrate. Verbal order given for Lasix 40 mg IM X 1. Today, pt reports she is feeling much better. Breathing easier, less dyspnea. Pt reported the injection helped. Pt reports her appetite is fair. She continues to work with PT/OT. Nursing has notified surgeon of this weekends events. Pt has upcoming follow up visit with surgeon. At this time, pt denies n/v/d/f/c/cp/sob/ha/abd pain/dizziness/cough. Pt reports pain is minimal. No edema. VSS. No other complaints.    Past Medical History:  Diagnosis Date  . Anemia   . Anxiety 10/02/2012  . Atrial fibrillation ([place]) 11/13   new onset 11/13  . BCC (basal cell carcinoma), face 04/24/2016  .  Benign essential hypertension 03/13/2015  . Cellulitis of right upper extremity 01/27/2017  . Constipation 01/13/2015  . Decreased hearing 05/21/2015  . Health care maintenance 10/14/2014   Mammogram 10/01/15 - [facility] I.    . Hypercholesterolemia   . Hyperlipidemia, mixed 02/01/2017  . Hypertension   . Osteoarthritis    hands, feet  . Sick sinus syndrome ([place]) 01/04/2017   Overview:  Sp pacer placement 2018  . Syncope 12/08/2016   Past Surgical History:  Procedure Laterality Date  . APPENDECTOMY  1957  . CATARACT EXTRACTION  2013   left eye  . COLONOSCOPY  09/06/2008  . INCISION AND DRAINAGE ABSCESS Right 01/29/2017   Procedure: INCISION AND DRAINAGE ABSCESS;  Surgeon: Florene Glen, MD;  Location: [facility] ORS;  Service: General;  Laterality: Right;  . KIDNEY SURGERY  age 38  . PACEMAKER INSERTION Left 12/14/2016   Procedure: INSERTION PACEMAKER;  Surgeon: Isaias Cowman, MD;  Location: [facility] ORS;  Service: Cardiovascular;  Laterality: Left;  . TUBAL LIGATION    . VIDEO ASSISTED THORACOSCOPY (VATS)/THOROCOTOMY Right 04/19/2017   Procedure: RIGHT THORACOSCOPY AND EVACUATION OF HEMOTHORAX;  Surgeon: Nestor Lewandowsky, MD;  Location: [facility] ORS;  Service: General;  Laterality: Right;    No Known Allergies  Allergies as of 05/04/2017   No Known Allergies     Medication List       Accurate as of 05/04/17  3:26 PM. Always use your most recent med list.          acetaminophen 325 MG tablet Commonly known as:  TYLENOL Take 2  tablets (650 mg total) by mouth every 6 (six) hours as needed for mild pain, moderate pain or fever (or Fever >/= 101).   albuterol (2.5 MG/3ML) 0.083% nebulizer solution Commonly known as:  PROVENTIL Take 3 mLs (2.5 mg total) by nebulization every 2 (two) hours as needed for wheezing.   amiodarone 200 MG tablet Commonly known as:  PACERONE Take 1 tablet (200 mg total) by mouth 2 (two) times daily.   bisacodyl 5 MG EC tablet Commonly known as:  DULCOLAX Take 1  tablet (5 mg total) by mouth daily as needed for moderate constipation.   FLUoxetine 40 MG capsule Commonly known as:  PROZAC Take 1 capsule (40 mg total) by mouth daily.   HYDROcodone-acetaminophen 5-325 MG tablet Commonly known as:  NORCO/VICODIN Take 1-2 tablets by mouth every 6 (six) hours as needed for moderate pain.   lovastatin 20 MG tablet Commonly known as:  MEVACOR Take 1 tablet (20 mg total) by mouth daily.   metoprolol succinate 50 MG 24 hr tablet Commonly known as:  TOPROL-XL TAKE (1) TABLET BY MOUTH DAILY. TAKE WITH OR IMMEDIATELY FOLLOWING A MEAL   senna-docusate 8.6-50 MG tablet Commonly known as:  Senokot-S Take 1 tablet by mouth at bedtime as needed for mild constipation.   traZODone 50 MG tablet Commonly known as:  DESYREL Take 1 tablet (50 mg total) by mouth at bedtime as needed for sleep.   Vitamin D-3 1000 units Caps Take 1 capsule by mouth daily.       Review of Systems  Constitutional: Negative for activity change, appetite change, chills, diaphoresis and fever.  HENT: Negative for congestion, sneezing, sore throat, trouble swallowing and voice change.   Respiratory: Negative for apnea, cough, choking, chest tightness, shortness of breath and wheezing.   Cardiovascular: Negative for chest pain, palpitations and leg swelling.  Gastrointestinal: Negative for abdominal distention, abdominal pain, constipation, diarrhea and nausea.  Genitourinary: Negative for difficulty urinating, dysuria, frequency and urgency.  Musculoskeletal: Positive for arthralgias (typical arthritis). Negative for back pain, gait problem and myalgias.  Skin: Negative for color change, pallor, rash and wound.  Neurological: Positive for weakness. Negative for dizziness, tremors, syncope, speech difficulty, numbness and headaches.  Psychiatric/Behavioral: Negative for agitation and behavioral problems.  All other systems reviewed and are negative.   Immunization History    Administered Date(s) Administered  . Influenza Split 05/23/2012  . Influenza, High Dose Seasonal PF 05/08/2016  . Influenza,inj,Quad PF,6+ Mos 05/05/2013, 04/06/2014, 05/21/2015  . Influenza-Unspecified 05/21/2012  . Pneumococcal Conjugate-13 08/11/2011   Pertinent  Health Maintenance Due  Topic Date Due  . DEXA SCAN  06/25/1995  . PNA vac Low Risk Adult (2 of 2 - PPSV23) 08/10/2012  . MAMMOGRAM  09/30/2016  . INFLUENZA VACCINE  03/10/2017   Fall Risk  10/28/2016 05/08/2016 05/21/2015 01/11/2015 12/01/2013  Falls in the past year? No No No No No   Functional Status Survey:    Vitals:   05/03/17 1503  BP: (!) 143/64  Pulse: 62  Resp: 18  Temp: (!) 97.3 F (36.3 C)  SpO2: 94%  Weight: 172 lb 6.4 oz (78.2 kg)  Height: 5' 2"  (1.575 m)   Body mass index is 31.53 kg/m. Physical Exam  Constitutional: She is oriented to person, place, and time. Vital signs are normal. She appears well-developed and well-nourished. She is active and cooperative. She does not appear ill. No distress.  HENT:  Head: Normocephalic and atraumatic.  Mouth/Throat: Uvula is midline, oropharynx is clear and moist  and mucous membranes are normal. Mucous membranes are not pale, not dry and not cyanotic.  Eyes: Pupils are equal, round, and reactive to light. Conjunctivae, EOM and lids are normal.  Neck: Trachea normal, normal range of motion and full passive range of motion without pain. Neck supple. No JVD present. No tracheal deviation, no edema and no erythema present. No thyromegaly present.  Cardiovascular: Normal rate, regular rhythm, normal heart sounds, intact distal pulses and normal pulses.  Exam reveals no gallop, no distant heart sounds and no friction rub.   No murmur heard. Pulses:      Dorsalis pedis pulses are 2+ on the right side, and 2+ on the left side.  No edema  Pulmonary/Chest: Effort normal. No accessory muscle usage. No respiratory distress. She has no wheezes. She has no rhonchi. She has  rales (faint) in the right lower field. She exhibits no tenderness.  Abdominal: Normal appearance and bowel sounds are normal. She exhibits no distension and no ascites. There is no tenderness.  Musculoskeletal: Normal range of motion. She exhibits no edema or tenderness.  Expected osteoarthritis, stiffness  Neurological: She is alert and oriented to person, place, and time. She has normal strength.  Skin: Skin is warm, dry and intact. She is not diaphoretic. No cyanosis. No pallor. Nails show no clubbing.  Psychiatric: She has a normal mood and affect. Her speech is normal and behavior is normal. Judgment and thought content normal. Cognition and memory are normal.  Nursing note and vitals reviewed.   Labs reviewed:  Recent Labs  12/08/16 2145  04/19/17 0451  04/22/17 1242 04/23/17 0613 04/27/17 0730  NA  --   < > 139  < > 136 139 138  K  --   < > 4.0  < > 4.4 4.1 4.0  CL  --   < > 108  < > 104 106 106  CO2  --   < > 23  < > 27 24 25   GLUCOSE  --   < > 120*  < > 110* 91 96  BUN  --   < > 37*  < > 19 17 18   CREATININE  --   < > 1.49*  < > 0.71 0.75 0.85  CALCIUM  --   < > 8.6*  < > 8.6* 8.4* 8.6*  MG 2.0  --  1.9  --   --   --   --   PHOS  --   --  3.6  --   --   --   --   < > = values in this interval not displayed.  Recent Labs  01/27/17 2113 03/23/17 1212 04/18/17 0526  AST 24 17 36  ALT 15 9 25   ALKPHOS 85 70 63  BILITOT 1.0 0.4 0.7  PROT 6.5 6.4 6.7  ALBUMIN 3.6 4.2 3.7    Recent Labs  04/18/17 0526  04/28/17 0835 04/30/17 0400 05/01/17 0607  WBC 10.1  < > 8.7 7.3 6.9  NEUTROABS 7.8*  --   --  4.8 4.9  HGB 11.8*  < > 8.7* 7.7* 8.4*  HCT 34.6*  < > 26.8* 23.5* 25.3*  MCV 84.0  < > 83.1 83.2 84.2  PLT 201  < > 320 268 284  < > = values in this interval not displayed. Lab Results  Component Value Date   TSH 2.822 04/30/2017   No results found for: HGBA1C Lab Results  Component Value Date   CHOL 219 (H) 11/26/2016  HDL 91.90 11/26/2016   LDLCALC 113  (H) 11/26/2016   LDLDIRECT 105.8 09/06/2013   TRIG 68.0 11/26/2016   CHOLHDL 2 11/26/2016    Significant Diagnostic Results in last 30 days:  Dg Chest 2 View  Result Date: 04/27/2017 CLINICAL DATA:  Post right thoracotomy for evacuation of hemothorax 1 week ago EXAM: CHEST  2 VIEW COMPARISON:  Chest x-ray of 04/26/2017 FINDINGS: This still appears to be a tiny right apical pneumothorax present with right chest tube noted. Small left pleural effusion remains. Cardiomegaly is stable. Permanent pacemaker is unchanged. IMPRESSION: 1. Little change in tiny right apical pneumothorax. Right chest tube remains. 2. Small left pleural effusion is unchanged. Stable cardiomegaly with pacer. Electronically Signed   By: Ivar Drape M.D.   On: 04/27/2017 08:13   Dg Chest 2 View  Result Date: 04/23/2017 CLINICAL DATA:  Postoperative evaluation.  Thoracoscopy 4 days ago. EXAM: CHEST  2 VIEW COMPARISON:  Yesterday FINDINGS: Small right apical pneumothorax, less than 5%, unchanged. Right-sided chest tubes in stable position, 1 tip at the apex, the other at the base. There is artifact from EKG leads. Dual-chamber pacer from the left. Small left pleural effusion and retrocardiac lung opacity. Stable cardiomegaly. IMPRESSION: Stable small right apical pneumothorax, less than 5%. Chest tubes are in stable position. Electronically Signed   By: Monte Fantasia M.D.   On: 04/23/2017 08:38   Dg Chest 2 View  Result Date: 04/22/2017 CLINICAL DATA:  Status post thoracoscopy and evacuation of a hemothorax on the right 3 days ago. EXAM: CHEST  2 VIEW COMPARISON:  Portable chest x-ray of April 20, 2017 FINDINGS: The lungs are adequately inflated. A tiny right apical pneumothorax amounting to less than 5% of the lung volume is noted. The 2 right-sided chest tubes are in stable position. There is no right pleural effusion. On the left there is basilar atelectasis and small effusion. The heart is enlarged. The pulmonary  vascularity is mildly prominent centrally. The ICD is in stable position. There is calcification in the wall of the aortic arch. IMPRESSION: 5% or less right apical pneumothorax. Stable positioning of the chest tubes with the uppermost tube tip projecting over the medial aspect of the right fourth posterior rib. Left lower lobe atelectasis or pneumonia increased since the study of 2 days ago periods probable trace left pleural effusion. Thoracic aortic atherosclerosis. Electronically Signed   By: David  Martinique M.D.   On: 04/22/2017 07:24   Dg Chest Port 1 View  Result Date: 04/28/2017 CLINICAL DATA:  Postop, chest tube clamped EXAM: PORTABLE CHEST 1 VIEW COMPARISON:  04/28/2017 at 0739 hours FINDINGS: Tiny right apical pneumothorax, unchanged.  Stable right chest tube. Small left pleural effusion.  No frank interstitial edema. The heart is normal in size.  Left subclavian pacemaker. IMPRESSION: Tiny right apical pneumothorax, unchanged.  Stable right chest tube. Electronically Signed   By: Julian Hy M.D.   On: 04/28/2017 14:09   Dg Chest Port 1 View  Result Date: 04/28/2017 CLINICAL DATA:  Right pneumothorax. EXAM: PORTABLE CHEST 1 VIEW COMPARISON:  04/27/2017 FINDINGS: Single right-sided chest tube remains in place. Tiny residual right apical pneumothorax is essentially unchanged. There is increased accentuation of the interstitial markings at the lung bases suggesting mild interstitial edema. Chronic cardiomegaly. Pacemaker in place. Small persistent left effusion. No appreciable right effusion. Blunting of the right costophrenic angle laterally. IMPRESSION: No change in tiny right apical pneumothorax. New interstitial accentuation suggesting mild pulmonary edema. Small left effusion, stable. Electronically  Signed   By: Lorriane Shire M.D.   On: 04/28/2017 08:29   Dg Chest Port 1 View  Result Date: 04/26/2017 CLINICAL DATA:  Status post right thoracoscopy. EXAM: PORTABLE CHEST 1 VIEW  COMPARISON:  Radiograph of April 25, 2017. FINDINGS: Stable cardiomegaly. Atherosclerosis of thoracic aorta is noted. Left-sided pacemaker is unchanged in position. Stable mild right apical pneumothorax is noted. Right-sided chest tube is unchanged in position. Stable bibasilar subsegmental atelectasis with associated pleural effusions is noted. Bony thorax is unremarkable. Degenerative changes seen involving the left glenohumeral joint. IMPRESSION: Aortic atherosclerosis. Stable bibasilar subsegmental atelectasis with associated pleural effusions. Stable mild right apical pneumothorax. Right-sided chest tube is unchanged in position. Electronically Signed   By: Marijo Conception, M.D.   On: 04/26/2017 07:46   Dg Chest Port 1 View  Result Date: 04/25/2017 CLINICAL DATA:  Right-sided chest tube.  Follow-up of pneumothorax. EXAM: PORTABLE CHEST 1 VIEW COMPARISON:  04/24/2017 FINDINGS: Dual lead pacer. Midline trachea. Mild cardiomegaly with transverse aortic atherosclerosis. Right hemidiaphragm elevation. Trace bilateral pleural effusions are similar. Approximately 5-10% right apical pneumothorax identified. Slightly decreased today, with visceral pleural line 10 mm from chest wall versus 13 mm yesterday. right chest tube remains in place. Resolution of congestive heart failure. Bibasilar airspace disease remains. IMPRESSION: Slight decrease in 5-10% right apical pneumothorax. Cardiomegaly with persistent bibasilar atelectasis and probable small bilateral pleural effusions. Electronically Signed   By: Abigail Miyamoto M.D.   On: 04/25/2017 08:58   Dg Chest Port 1 View  Result Date: 04/24/2017 CLINICAL DATA:  Pneumothorax. EXAM: PORTABLE CHEST 1 VIEW COMPARISON:  Chest x-ray from yesterday. FINDINGS: Interval removal of the right apical chest tube. The right basilar chest tube is unchanged in position. The right apical pneumothorax is slightly increased in size, now approximately 10%. Stable cardiomegaly.  Unchanged left chest wall AICD. Mild pulmonary vascular congestion. Unchanged left basilar opacity and small bilateral pleural effusions. IMPRESSION: 1. Interval removal of the right apical chest tube. Small right apical pneumothorax is slightly increased in size, now approximately 10%. 2. Stable cardiomegaly, mild pulmonary vascular congestion, and small bilateral pleural effusions. Unchanged left basilar atelectasis versus infiltrate. These results will be called to the ordering clinician or representative by the Radiologist Assistant, and communication documented in the PACS or zVision Dashboard. Electronically Signed   By: Titus Dubin M.D.   On: 04/24/2017 08:39   Dg Chest Port 1 View  Result Date: 04/20/2017 CLINICAL DATA:  Postop VATS. EXAM: PORTABLE CHEST 1 VIEW COMPARISON:  04/19/2017 FINDINGS: Right chest tubes remain in place. The more a PICC Lee located chest tube on the prior study appears slightly more inferiorly and medially located on the current examination. The cardiomediastinal silhouette is unchanged. Aortic atherosclerosis and a pacemaker are again noted. There is slightly increased opacity in the right lung base, likely atelectasis. There is likely a trace right pleural effusion. No pneumothorax is identified. There is at most minimal atelectasis in the left lung base. IMPRESSION: 1. Right chest tubes as above.  No pneumothorax. 2. Minimal right basilar atelectasis and likely trace pleural effusion. Electronically Signed   By: Logan Bores M.D.   On: 04/20/2017 09:58   Dg Chest Port 1 View  Result Date: 04/19/2017 CLINICAL DATA:  Respiratory failure, postop EXAM: PORTABLE CHEST 1 VIEW COMPARISON:  04/19/2017 FINDINGS: Right chest tubes are in place. No pneumothorax. Re- aeration of the right lung. Heart is borderline in size. Pacer is unchanged. No confluent opacity currently. IMPRESSION: Placement of right  chest tubes without pneumothorax. Re-expansion and clearance of the right  lung. Electronically Signed   By: Rolm Baptise M.D.   On: 04/19/2017 17:27   Dg Chest Port 1 View  Result Date: 04/19/2017 CLINICAL DATA:  Status post right-sided thoracentesis EXAM: PORTABLE CHEST 1 VIEW COMPARISON:  April 18, 2017 FINDINGS: No pneumothorax. There is essentially complete opacification of the right hemithorax due to a combination of pleural effusion and consolidation. Left lung is clear. Heart is upper normal in size. Pulmonary vascularity on the left is normal. Pulmonary vascularity on the right is obscured. Pacemaker leads are attached to the right atrium and right ventricle. There is aortic atherosclerosis. Bones are osteoporotic. No focal bone lesions are evident. No adenopathy is appreciable in areas which can be interrogated with respect to appearance of hilum and mediastinum. IMPRESSION: No pneumothorax. Complete opacification of right hemithorax, likely due to combination of large effusion and consolidation/compressive atelectasis. Left lung clear. Stable cardiac silhouette. There is aortic atherosclerosis. Aortic Atherosclerosis (ICD10-I70.0). Electronically Signed   By: Lowella Grip III M.D.   On: 04/19/2017 11:38   Dg Chest Port 1 View  Result Date: 04/18/2017 CLINICAL DATA:  Diffuse body pain. Pacemaker installed last week. Altered mental status. EXAM: PORTABLE CHEST 1 VIEW COMPARISON:  Chest radiograph Dec 14, 2016 FINDINGS: New large RIGHT pleural effusion with sub pulmonic component. RIGHT lung base consolidation. RIGHT midlung zone atelectasis. LEFT lung is clear. Cardiac silhouette predominately obscured on the RIGHT. Calcified aortic knob. No pneumothorax. Dual lead LEFT cardiac pacemaker in situ. Severe LEFT glenohumeral osteoarthrosis. IMPRESSION: New large RIGHT pleural effusion with underlying consolidation, associated compressive atelectasis. Recommend follow-up chest radiograph after treatment to verify improvement. Electronically Signed   By: Elon Alas  M.D.   On: 04/18/2017 05:46   Dg Hip Unilat With Pelvis 2-3 Views Left  Result Date: 04/20/2017 CLINICAL DATA:  Left hip anterior hip pain. EXAM: DG HIP (WITH OR WITHOUT PELVIS) 2-3V LEFT COMPARISON:  None. FINDINGS: An AP view the pelvis with AP and frogleg views of the left hip are provided. There is lower lumbar degenerative disc disease at L4-5 with associated facet arthropathy. The bony pelvis appears intact with mild osteoarthritic sclerosis of both SI joints along their synovial portion. Moderate size stool ball is seen in the mid pelvis. There is axial joint space narrowing of both hips mild-to-moderate in degree. No acute fracture or suspicious osseous lesions. Vascular calcifications are identified along the course of the left femoral artery. Injection granulomata are noted about the left buttock. IMPRESSION: 1. Mild axial degenerative joint space narrowing of both hips. 2. No acute osseous abnormality of the pelvis and hips. 3. Osteoarthritis of the SI joints. 4. Lower lumbar degenerative disc disease. Electronically Signed   By: Ashley Royalty M.D.   On: 04/20/2017 14:31   US Thoracentesis Asp Pleural Space W/img Guide  Result Date: 04/19/2017 INDICATION: Symptomatic right-sided pleural effusion PROCEDURE: ULTRASOUND GUIDED right THORACENTESIS COMPARISON:  None. MEDICATIONS: 10 cc 1% lidocaine. COMPLICATIONS: None immediate. TECHNIQUE: Informed written consent was obtained from the patient after a discussion of the risks, benefits and alternatives to treatment. A timeout was performed prior to the initiation of the procedure. Initial ultrasound scanning demonstrates a right pleural effusion. The lower chest was prepped and draped in the usual sterile fashion. 1% lidocaine was used for local anesthesia. Under direct ultrasound guidance, a 19 gauge, 7-cm, Yueh catheter was introduced. An ultrasound image was saved for documentation purposes. The thoracentesis was performed. The catheter was  removed  and a dressing was applied. The patient tolerated the procedure well without immediate post procedural complication. The patient was escorted to have an upright chest radiograph. FINDINGS: A total of approximately 75 cc of thick bloody fluid was removed. Requested samples were sent to the laboratory. IMPRESSION: Successful ultrasound-guided right sided thoracentesis yielding 75 cc of thick bloody pleural fluid. : Read by Lavonia Drafts [facility] Electronically Signed   By: Inez Catalina M.D.   On: 04/19/2017 11:54    Assessment/Plan 1. Infiltrate of lower lobe of right lung present on imaging study  Lasix 40 mg IM x 1 (given on Saturday  Update surgeon  F/U CXR soon  Labs next week  Family/ staff Communication:   Total Time:  Documentation:  Face to Face:  Family/Phone:   Labs/tests ordered:  Cbc, met c  Medication list reviewed and assessed for continued appropriateness.  Vikki Ports, NP-C [facility] [id number] [place], [place] [address] Cell Phone (Mon-Fri 8am-5pm):  [phone] On Call:  [phone] & follow prompts after 5pm & weekends Office Phone:  [phone] Office Fax:  [phone]

## 2017-05-05 ENCOUNTER — Other Ambulatory Visit: Payer: Self-pay

## 2017-05-06 ENCOUNTER — Ambulatory Visit
Admission: RE | Admit: 2017-05-06 | Discharge: 2017-05-06 | Disposition: A | Discharge status: Home / Self Care | Payer: Medicare Other | Source: Ambulatory Visit | Attending: Cardiothoracic Surgery | Admitting: Cardiothoracic Surgery

## 2017-05-06 ENCOUNTER — Ambulatory Visit (INDEPENDENT_AMBULATORY_CARE_PROVIDER_SITE_OTHER): Payer: Medicare Other | Admitting: Cardiothoracic Surgery

## 2017-05-06 ENCOUNTER — Encounter: Payer: Self-pay | Admitting: Cardiothoracic Surgery

## 2017-05-06 VITALS — BP 133/78 | HR 67 | Temp 97.4°F | Ht 62.0 in | Wt 176.4 lb

## 2017-05-06 DIAGNOSIS — I517 Cardiomegaly: Secondary | ICD-10-CM | POA: Diagnosis not present

## 2017-05-06 DIAGNOSIS — J942 Hemothorax: Secondary | ICD-10-CM

## 2017-05-06 DIAGNOSIS — R918 Other nonspecific abnormal finding of lung field: Secondary | ICD-10-CM | POA: Insufficient documentation

## 2017-05-06 DIAGNOSIS — J90 Pleural effusion, not elsewhere classified: Secondary | ICD-10-CM | POA: Diagnosis not present

## 2017-05-06 DIAGNOSIS — I700 Atherosclerosis of aorta: Secondary | ICD-10-CM | POA: Diagnosis not present

## 2017-05-06 NOTE — Patient Instructions (Signed)
We have removed sutures today and placed steri strips and placed a dressing over the area. You may remove the dressing later tonight.  The steri strips will begin to peel up on the ends and fall off 7-10 days.  You may shower as usual and be sure to pat dry the steri strips.  Please go to the [place] at [facility] to have the Chest xray done.   Please call Dr.Oaks tomorrow  @ [phone] for the results of the chest x-ray.

## 2017-05-06 NOTE — Progress Notes (Signed)
She comes in today having undergone a right-sided thoracoscopy for a pneumothorax. She is at the [facility]. She states that she does have some physical therapy but would desire to be more active. She has an occasional cough but no fevers or chills. She has a nonproductive cough.  Today on physical exam all 4 wounds are healing as expected. She does have by basilar crackles at both bases. Her heart is regular.  I would like to obtain a chest x-ray today. Her daughter will contact me tomorrow to review those results. Based upon the results of the chest x-ray and may see her back again. We will make that arrangement after I see the films.

## 2017-05-07 ENCOUNTER — Telehealth: Payer: Self-pay

## 2017-05-07 ENCOUNTER — Encounter: Payer: Self-pay | Admitting: Cardiothoracic Surgery

## 2017-05-07 DIAGNOSIS — J189 Pneumonia, unspecified organism: Secondary | ICD-10-CM

## 2017-05-07 NOTE — Telephone Encounter (Signed)
Left message for patient's daughter Arbie Cookey to return call to office.  Appointment with Dr.Oaks 05/13/17 @ 9am  Chest xray prior to visit. Please have patient go to Radiology one hour prior to appointment with Dr.Oaks.

## 2017-05-07 NOTE — Telephone Encounter (Signed)
error 

## 2017-05-10 ENCOUNTER — Encounter
Admission: RE | Admit: 2017-05-10 | Discharge: 2017-05-10 | Disposition: A | Discharge status: Home / Self Care | Payer: Medicare Other | Source: Ambulatory Visit | Attending: Internal Medicine | Admitting: Internal Medicine

## 2017-05-10 ENCOUNTER — Encounter: Payer: Self-pay | Admitting: Gerontology

## 2017-05-10 ENCOUNTER — Telehealth: Payer: Self-pay

## 2017-05-10 NOTE — Telephone Encounter (Signed)
Spoke with Arbie Cookey patient's daughter and confirmed 05/13/17 appointment @ 1:30 pm and patient to have chest xray  @ 12:30 prior to office visit.

## 2017-05-10 NOTE — Progress Notes (Deleted)
Location:   [facility] Room Number: [phone] Place of Service:  SNF [phone]) Provider:  Toni Arthurs, NP-C  Einar Pheasant, MD  Patient Care Team: Einar Pheasant, MD as PCP - General (Internal Medicine)  Extended Emergency Contact Information Primary Emergency Contact: Osborne Casco of [place] Phone: [phone] Mobile Phone: [phone] Relation: Daughter Secondary Emergency Contact: Ilona Sorrel [place] of [place] Phone: [phone] Relation: Son  Code Status:  FULL Goals of care: Advanced Directive information Advanced Directives 05/10/2017  Does Patient Have a Medical Advance Directive? No  Type of Advance Directive -  Does patient want to make changes to medical advance directive? -  Copy of [place] in Chart? -  Would patient like information on creating a medical advance directive? -     Chief Complaint  Patient presents with  . Medical Management of Chronic Issues    Routine Visit    HPI:  Pt is a 83 y.o. female seen today for medical management of chronic diseases.     Past Medical History:  Diagnosis Date  . Anemia   . Anxiety 10/02/2012  . Atrial fibrillation ([place]) 11/13   new onset 11/13  . BCC (basal cell carcinoma), face 04/24/2016  . Benign essential hypertension 03/13/2015  . Cellulitis of right upper extremity 01/27/2017  . Constipation 01/13/2015  . Decreased hearing 05/21/2015  . Health care maintenance 10/14/2014   Mammogram 10/01/15 - Birads I.    . Hypercholesterolemia   . Hyperlipidemia, mixed 02/01/2017  . Hypertension   . Osteoarthritis    hands, feet  . Sick sinus syndrome ([place]) 01/04/2017   Overview:  Sp pacer placement 2018  . Syncope 12/08/2016   Past Surgical History:  Procedure Laterality Date  . APPENDECTOMY  1957  . CATARACT EXTRACTION  2013   left eye  . COLONOSCOPY  09/06/2008  . INCISION AND DRAINAGE ABSCESS Right 01/29/2017   Procedure: INCISION  AND DRAINAGE ABSCESS;  Surgeon: Florene Glen, MD;  Location: [facility] ORS;  Service: General;  Laterality: Right;  . KIDNEY SURGERY  age 74  . PACEMAKER INSERTION Left 12/14/2016   Procedure: INSERTION PACEMAKER;  Surgeon: Isaias Cowman, MD;  Location: [facility] ORS;  Service: Cardiovascular;  Laterality: Left;  . TUBAL LIGATION    . VIDEO ASSISTED THORACOSCOPY (VATS)/THOROCOTOMY Right 04/19/2017   Procedure: RIGHT THORACOSCOPY AND EVACUATION OF HEMOTHORAX;  Surgeon: Nestor Lewandowsky, MD;  Location: [facility] ORS;  Service: General;  Laterality: Right;    No Known Allergies  Allergies as of 05/10/2017   No Known Allergies     Medication List       Accurate as of 05/10/17 10:26 AM. Always use your most recent med list.          acetaminophen 325 MG tablet Commonly known as:  TYLENOL Take 2 tablets (650 mg total) by mouth every 6 (six) hours as needed for mild pain, moderate pain or fever (or Fever >/= 101).   albuterol (2.5 MG/3ML) 0.083% nebulizer solution Commonly known as:  PROVENTIL Take 3 mLs (2.5 mg total) by nebulization every 2 (two) hours as needed for wheezing.   amiodarone 200 MG tablet Commonly known as:  PACERONE Take 1 tablet (200 mg total) by mouth 2 (two) times daily.   bisacodyl 5 MG EC tablet Commonly known as:  DULCOLAX Take 1 tablet (5 mg total) by mouth daily as needed for moderate constipation.   donepezil 5 MG tablet Commonly known as:  ARICEPT  Take 5 mg by mouth at bedtime.   FLUoxetine 40 MG capsule Commonly known as:  PROZAC Take 1 capsule (40 mg total) by mouth daily.   HYDROcodone-acetaminophen 5-325 MG tablet Commonly known as:  NORCO/VICODIN Take 1-2 tablets by mouth every 6 (six) hours as needed for moderate pain.   lovastatin 20 MG tablet Commonly known as:  MEVACOR Take 1 tablet (20 mg total) by mouth daily.   metoprolol succinate 50 MG 24 hr tablet Commonly known as:  TOPROL-XL TAKE (1) TABLET BY MOUTH DAILY. TAKE WITH OR IMMEDIATELY  FOLLOWING A MEAL   senna-docusate 8.6-50 MG tablet Commonly known as:  Senokot-S Take 1 tablet by mouth at bedtime as needed for mild constipation.   traZODone 50 MG tablet Commonly known as:  DESYREL Take 1 tablet (50 mg total) by mouth at bedtime as needed for sleep.   Vitamin D-3 1000 units Caps Take 1 capsule by mouth daily.       Review of Systems  Immunization History  Administered Date(s) Administered  . Influenza Split 05/23/2012  . Influenza, High Dose Seasonal PF 05/08/2016  . Influenza,inj,Quad PF,6+ Mos 05/05/2013, 04/06/2014, 05/21/2015  . Influenza-Unspecified 05/21/2012  . Pneumococcal Conjugate-13 08/11/2011   Pertinent  Health Maintenance Due  Topic Date Due  . DEXA SCAN  06/25/1995  . PNA vac Low Risk Adult (2 of 2 - PPSV23) 08/10/2012  . MAMMOGRAM  09/30/2016  . INFLUENZA VACCINE  03/10/2017   Fall Risk  10/28/2016 05/08/2016 05/21/2015 01/11/2015 12/01/2013  Falls in the past year? No No No No No   Functional Status Survey:    Vitals:   05/10/17 0953  BP: (!) 147/93  Pulse: 100  Resp: 20  Temp: 98.3 F (36.8 C)  SpO2: 97%  Weight: 172 lb 6.4 oz (78.2 kg)  Height: 5\' 2"  (1.575 m)   Body mass index is 31.53 kg/m. Physical Exam  Labs reviewed:  Recent Labs  12/08/16 2145  04/19/17 0451  04/22/17 1242 04/23/17 0613 04/27/17 0730  NA  --   < > 139  < > 136 139 138  K  --   < > 4.0  < > 4.4 4.1 4.0  CL  --   < > 108  < > 104 106 106  CO2  --   < > 23  < > 27 24 25   GLUCOSE  --   < > 120*  < > 110* 91 96  BUN  --   < > 37*  < > 19 17 18   CREATININE  --   < > 1.49*  < > 0.71 0.75 0.85  CALCIUM  --   < > 8.6*  < > 8.6* 8.4* 8.6*  MG 2.0  --  1.9  --   --   --   --   PHOS  --   --  3.6  --   --   --   --   < > = values in this interval not displayed.  Recent Labs  01/27/17 2113 03/23/17 1212 04/18/17 0526  AST 24 17 36  ALT 15 9 25   ALKPHOS 85 70 63  BILITOT 1.0 0.4 0.7  PROT 6.5 6.4 6.7  ALBUMIN 3.6 4.2 3.7    Recent Labs   04/18/17 0526  04/28/17 0835 04/30/17 0400 05/01/17 0607  WBC 10.1  < > 8.7 7.3 6.9  NEUTROABS 7.8*  --   --  4.8 4.9  HGB 11.8*  < > 8.7* 7.7* 8.4*  HCT 34.6*  < > 26.8* 23.5* 25.3*  MCV 84.0  < > 83.1 83.2 84.2  PLT 201  < > 320 268 284  < > = values in this interval not displayed. Lab Results  Component Value Date   TSH 2.822 04/30/2017   No results found for: HGBA1C Lab Results  Component Value Date   CHOL 219 (H) 11/26/2016   HDL 91.90 11/26/2016   LDLCALC 113 (H) 11/26/2016   LDLDIRECT 105.8 09/06/2013   TRIG 68.0 11/26/2016   CHOLHDL 2 11/26/2016    Significant Diagnostic Results in last 30 days:  Dg Chest 2 View  Result Date: 05/06/2017 CLINICAL DATA:  Cough. EXAM: CHEST  2 VIEW COMPARISON:  Radiographs of April 28, 2017. FINDINGS: Stable cardiomegaly. Atherosclerosis of thoracic aorta is noted. No pneumothorax is noted. Increased right middle lobe opacity is noted concerning for pneumonia or atelectasis with mild associated pleural effusion. Right-sided chest tube has been removed. Left-sided pacemaker is unchanged in position. Minimal left pleural effusion is noted. Bony thorax is unremarkable. IMPRESSION: Aortic atherosclerosis. Stable cardiomegaly. Increased right middle lobe pneumonia or atelectasis is noted with mild associated pleural effusion. Electronically Signed   By: Marijo Conception, M.D.   On: 05/06/2017 17:13   Dg Chest 2 View  Result Date: 04/27/2017 CLINICAL DATA:  Post right thoracotomy for evacuation of hemothorax 1 week ago EXAM: CHEST  2 VIEW COMPARISON:  Chest x-ray of 04/26/2017 FINDINGS: This still appears to be a tiny right apical pneumothorax present with right chest tube noted. Small left pleural effusion remains. Cardiomegaly is stable. Permanent pacemaker is unchanged. IMPRESSION: 1. Little change in tiny right apical pneumothorax. Right chest tube remains. 2. Small left pleural effusion is unchanged. Stable cardiomegaly with pacer.  Electronically Signed   By: Ivar Drape M.D.   On: 04/27/2017 08:13   Dg Chest 2 View  Result Date: 04/23/2017 CLINICAL DATA:  Postoperative evaluation.  Thoracoscopy 4 days ago. EXAM: CHEST  2 VIEW COMPARISON:  Yesterday FINDINGS: Small right apical pneumothorax, less than 5%, unchanged. Right-sided chest tubes in stable position, 1 tip at the apex, the other at the base. There is artifact from EKG leads. Dual-chamber pacer from the left. Small left pleural effusion and retrocardiac lung opacity. Stable cardiomegaly. IMPRESSION: Stable small right apical pneumothorax, less than 5%. Chest tubes are in stable position. Electronically Signed   By: Monte Fantasia M.D.   On: 04/23/2017 08:38   Dg Chest 2 View  Result Date: 04/22/2017 CLINICAL DATA:  Status post thoracoscopy and evacuation of a hemothorax on the right 3 days ago. EXAM: CHEST  2 VIEW COMPARISON:  Portable chest x-ray of April 20, 2017 FINDINGS: The lungs are adequately inflated. A tiny right apical pneumothorax amounting to less than 5% of the lung volume is noted. The 2 right-sided chest tubes are in stable position. There is no right pleural effusion. On the left there is basilar atelectasis and small effusion. The heart is enlarged. The pulmonary vascularity is mildly prominent centrally. The ICD is in stable position. There is calcification in the wall of the aortic arch. IMPRESSION: 5% or less right apical pneumothorax. Stable positioning of the chest tubes with the uppermost tube tip projecting over the medial aspect of the right fourth posterior rib. Left lower lobe atelectasis or pneumonia increased since the study of 2 days ago periods probable trace left pleural effusion. Thoracic aortic atherosclerosis. Electronically Signed   By: David  Martinique M.D.   On: 04/22/2017 07:24  Dg Chest Port 1 View  Result Date: 04/28/2017 CLINICAL DATA:  Postop, chest tube clamped EXAM: PORTABLE CHEST 1 VIEW COMPARISON:  04/28/2017 at 0739 hours  FINDINGS: Tiny right apical pneumothorax, unchanged.  Stable right chest tube. Small left pleural effusion.  No frank interstitial edema. The heart is normal in size.  Left subclavian pacemaker. IMPRESSION: Tiny right apical pneumothorax, unchanged.  Stable right chest tube. Electronically Signed   By: Julian Hy M.D.   On: 04/28/2017 14:09   Dg Chest Port 1 View  Result Date: 04/28/2017 CLINICAL DATA:  Right pneumothorax. EXAM: PORTABLE CHEST 1 VIEW COMPARISON:  04/27/2017 FINDINGS: Single right-sided chest tube remains in place. Tiny residual right apical pneumothorax is essentially unchanged. There is increased accentuation of the interstitial markings at the lung bases suggesting mild interstitial edema. Chronic cardiomegaly. Pacemaker in place. Small persistent left effusion. No appreciable right effusion. Blunting of the right costophrenic angle laterally. IMPRESSION: No change in tiny right apical pneumothorax. New interstitial accentuation suggesting mild pulmonary edema. Small left effusion, stable. Electronically Signed   By: Lorriane Shire M.D.   On: 04/28/2017 08:29   Dg Chest Port 1 View  Result Date: 04/26/2017 CLINICAL DATA:  Status post right thoracoscopy. EXAM: PORTABLE CHEST 1 VIEW COMPARISON:  Radiograph of April 25, 2017. FINDINGS: Stable cardiomegaly. Atherosclerosis of thoracic aorta is noted. Left-sided pacemaker is unchanged in position. Stable mild right apical pneumothorax is noted. Right-sided chest tube is unchanged in position. Stable bibasilar subsegmental atelectasis with associated pleural effusions is noted. Bony thorax is unremarkable. Degenerative changes seen involving the left glenohumeral joint. IMPRESSION: Aortic atherosclerosis. Stable bibasilar subsegmental atelectasis with associated pleural effusions. Stable mild right apical pneumothorax. Right-sided chest tube is unchanged in position. Electronically Signed   By: Marijo Conception, M.D.   On: 04/26/2017  07:46   Dg Chest Port 1 View  Result Date: 04/25/2017 CLINICAL DATA:  Right-sided chest tube.  Follow-up of pneumothorax. EXAM: PORTABLE CHEST 1 VIEW COMPARISON:  04/24/2017 FINDINGS: Dual lead pacer. Midline trachea. Mild cardiomegaly with transverse aortic atherosclerosis. Right hemidiaphragm elevation. Trace bilateral pleural effusions are similar. Approximately 5-10% right apical pneumothorax identified. Slightly decreased today, with visceral pleural line 10 mm from chest wall versus 13 mm yesterday. right chest tube remains in place. Resolution of congestive heart failure. Bibasilar airspace disease remains. IMPRESSION: Slight decrease in 5-10% right apical pneumothorax. Cardiomegaly with persistent bibasilar atelectasis and probable small bilateral pleural effusions. Electronically Signed   By: Abigail Miyamoto M.D.   On: 04/25/2017 08:58   Dg Chest Port 1 View  Result Date: 04/24/2017 CLINICAL DATA:  Pneumothorax. EXAM: PORTABLE CHEST 1 VIEW COMPARISON:  Chest x-ray from yesterday. FINDINGS: Interval removal of the right apical chest tube. The right basilar chest tube is unchanged in position. The right apical pneumothorax is slightly increased in size, now approximately 10%. Stable cardiomegaly. Unchanged left chest wall AICD. Mild pulmonary vascular congestion. Unchanged left basilar opacity and small bilateral pleural effusions. IMPRESSION: 1. Interval removal of the right apical chest tube. Small right apical pneumothorax is slightly increased in size, now approximately 10%. 2. Stable cardiomegaly, mild pulmonary vascular congestion, and small bilateral pleural effusions. Unchanged left basilar atelectasis versus infiltrate. These results will be called to the ordering clinician or representative by the Radiologist Assistant, and communication documented in the PACS or zVision Dashboard. Electronically Signed   By: Titus Dubin M.D.   On: 04/24/2017 08:39   Dg Chest Port 1 View  Result Date:  04/20/2017 CLINICAL DATA:  Postop VATS. EXAM: PORTABLE CHEST 1 VIEW COMPARISON:  04/19/2017 FINDINGS: Right chest tubes remain in place. The more a PICC Lee located chest tube on the prior study appears slightly more inferiorly and medially located on the current examination. The cardiomediastinal silhouette is unchanged. Aortic atherosclerosis and a pacemaker are again noted. There is slightly increased opacity in the right lung base, likely atelectasis. There is likely a trace right pleural effusion. No pneumothorax is identified. There is at most minimal atelectasis in the left lung base. IMPRESSION: 1. Right chest tubes as above.  No pneumothorax. 2. Minimal right basilar atelectasis and likely trace pleural effusion. Electronically Signed   By: Logan Bores M.D.   On: 04/20/2017 09:58   Dg Chest Port 1 View  Result Date: 04/19/2017 CLINICAL DATA:  Respiratory failure, postop EXAM: PORTABLE CHEST 1 VIEW COMPARISON:  04/19/2017 FINDINGS: Right chest tubes are in place. No pneumothorax. Re- aeration of the right lung. Heart is borderline in size. Pacer is unchanged. No confluent opacity currently. IMPRESSION: Placement of right chest tubes without pneumothorax. Re-expansion and clearance of the right lung. Electronically Signed   By: Rolm Baptise M.D.   On: 04/19/2017 17:27   Dg Chest Port 1 View  Result Date: 04/19/2017 CLINICAL DATA:  Status post right-sided thoracentesis EXAM: PORTABLE CHEST 1 VIEW COMPARISON:  April 18, 2017 FINDINGS: No pneumothorax. There is essentially complete opacification of the right hemithorax due to a combination of pleural effusion and consolidation. Left lung is clear. Heart is upper normal in size. Pulmonary vascularity on the left is normal. Pulmonary vascularity on the right is obscured. Pacemaker leads are attached to the right atrium and right ventricle. There is aortic atherosclerosis. Bones are osteoporotic. No focal bone lesions are evident. No adenopathy is  appreciable in areas which can be interrogated with respect to appearance of hilum and mediastinum. IMPRESSION: No pneumothorax. Complete opacification of right hemithorax, likely due to combination of large effusion and consolidation/compressive atelectasis. Left lung clear. Stable cardiac silhouette. There is aortic atherosclerosis. Aortic Atherosclerosis (ICD10-I70.0). Electronically Signed   By: Lowella Grip III M.D.   On: 04/19/2017 11:38   Dg Chest Port 1 View  Result Date: 04/18/2017 CLINICAL DATA:  Diffuse body pain. Pacemaker installed last week. Altered mental status. EXAM: PORTABLE CHEST 1 VIEW COMPARISON:  Chest radiograph Dec 14, 2016 FINDINGS: New large RIGHT pleural effusion with sub pulmonic component. RIGHT lung base consolidation. RIGHT midlung zone atelectasis. LEFT lung is clear. Cardiac silhouette predominately obscured on the RIGHT. Calcified aortic knob. No pneumothorax. Dual lead LEFT cardiac pacemaker in situ. Severe LEFT glenohumeral osteoarthrosis. IMPRESSION: New large RIGHT pleural effusion with underlying consolidation, associated compressive atelectasis. Recommend follow-up chest radiograph after treatment to verify improvement. Electronically Signed   By: Elon Alas M.D.   On: 04/18/2017 05:46   Dg Hip Unilat With Pelvis 2-3 Views Left  Result Date: 04/20/2017 CLINICAL DATA:  Left hip anterior hip pain. EXAM: DG HIP (WITH OR WITHOUT PELVIS) 2-3V LEFT COMPARISON:  None. FINDINGS: An AP view the pelvis with AP and frogleg views of the left hip are provided. There is lower lumbar degenerative disc disease at L4-5 with associated facet arthropathy. The bony pelvis appears intact with mild osteoarthritic sclerosis of both SI joints along their synovial portion. Moderate size stool ball is seen in the mid pelvis. There is axial joint space narrowing of both hips mild-to-moderate in degree. No acute fracture or suspicious osseous lesions. Vascular calcifications are  identified along the course of  the left femoral artery. Injection granulomata are noted about the left buttock. IMPRESSION: 1. Mild axial degenerative joint space narrowing of both hips. 2. No acute osseous abnormality of the pelvis and hips. 3. Osteoarthritis of the SI joints. 4. Lower lumbar degenerative disc disease. Electronically Signed   By: Ashley Royalty M.D.   On: 04/20/2017 14:31   US Thoracentesis Asp Pleural Space W/img Guide  Result Date: 04/19/2017 INDICATION: Symptomatic right-sided pleural effusion PROCEDURE: ULTRASOUND GUIDED right THORACENTESIS COMPARISON:  None. MEDICATIONS: 10 cc 1% lidocaine. COMPLICATIONS: None immediate. TECHNIQUE: Informed written consent was obtained from the patient after a discussion of the risks, benefits and alternatives to treatment. A timeout was performed prior to the initiation of the procedure. Initial ultrasound scanning demonstrates a right pleural effusion. The lower chest was prepped and draped in the usual sterile fashion. 1% lidocaine was used for local anesthesia. Under direct ultrasound guidance, a 19 gauge, 7-cm, Yueh catheter was introduced. An ultrasound image was saved for documentation purposes. The thoracentesis was performed. The catheter was removed and a dressing was applied. The patient tolerated the procedure well without immediate post procedural complication. The patient was escorted to have an upright chest radiograph. FINDINGS: A total of approximately 75 cc of thick bloody fluid was removed. Requested samples were sent to the laboratory. IMPRESSION: Successful ultrasound-guided right sided thoracentesis yielding 75 cc of thick bloody pleural fluid. : Read by Lavonia Drafts [facility] Electronically Signed   By: Inez Catalina M.D.   On: 04/19/2017 11:54    Assessment/Plan There are no diagnoses linked to this encounter.   Family/ staff Communication: ***  Total Time:  Documentation:  Face to Face:  Family/Phone:   Labs/tests  ordered:  ***  Medication list reviewed and assessed for continued appropriateness. Monthly medication orders reviewed and signed.  Vikki Ports, NP-C Geriatrics [facility] [phone], [place] [address] Cell Phone (Mon-Fri 8am-5pm):  [phone] On Call:  [phone] & follow prompts after 5pm & weekends Office Phone:  [phone] Office Fax:  [phone]

## 2017-05-11 ENCOUNTER — Other Ambulatory Visit
Admission: RE | Admit: 2017-05-11 | Discharge: 2017-05-11 | Disposition: A | Discharge status: Home / Self Care | Payer: Medicare Other | Source: Ambulatory Visit | Attending: Internal Medicine | Admitting: Internal Medicine

## 2017-05-11 ENCOUNTER — Ambulatory Visit: Payer: Medicare Other | Admitting: Internal Medicine

## 2017-05-11 DIAGNOSIS — J90 Pleural effusion, not elsewhere classified: Secondary | ICD-10-CM | POA: Insufficient documentation

## 2017-05-11 LAB — COMPREHENSIVE METABOLIC PANEL
ALBUMIN: 3.2 g/dL — AB (ref 3.5–5.0)
ALK PHOS: 88 U/L (ref 38–126)
ALT: 21 U/L (ref 14–54)
ANION GAP: 9 (ref 5–15)
AST: 29 U/L (ref 15–41)
BILIRUBIN TOTAL: 0.7 mg/dL (ref 0.3–1.2)
BUN: 22 mg/dL — AB (ref 6–20)
CALCIUM: 8.6 mg/dL — AB (ref 8.9–10.3)
CO2: 22 mmol/L (ref 22–32)
CREATININE: 0.98 mg/dL (ref 0.44–1.00)
Chloride: 102 mmol/L (ref 101–111)
GFR calc Af Amer: 59 mL/min — ABNORMAL LOW (ref >60)
GFR calc non Af Amer: 51 mL/min — ABNORMAL LOW (ref >60)
GLUCOSE: 91 mg/dL (ref 65–99)
Potassium: 3.2 mmol/L — ABNORMAL LOW (ref 3.5–5.1)
SODIUM: 133 mmol/L — AB (ref 135–145)
TOTAL PROTEIN: 6 g/dL — AB (ref 6.5–8.1)

## 2017-05-11 LAB — CBC WITH DIFFERENTIAL/PLATELET
BASOS PCT: 1 %
Basophils Absolute: 0 10*3/uL (ref 0–0.1)
Eosinophils Absolute: 0.1 10*3/uL (ref 0–0.7)
Eosinophils Relative: 1 %
HEMATOCRIT: 26.6 % — AB (ref 35.0–47.0)
HEMOGLOBIN: 8.5 g/dL — AB (ref 12.0–16.0)
LYMPHS ABS: 1 10*3/uL (ref 1.0–3.6)
Lymphocytes Relative: 13 %
MCH: 25.7 pg — ABNORMAL LOW (ref 26.0–34.0)
MCHC: 31.9 g/dL — AB (ref 32.0–36.0)
MCV: 80.4 fL (ref 80.0–100.0)
MONOS PCT: 15 %
Monocytes Absolute: 1.1 10*3/uL — ABNORMAL HIGH (ref 0.2–0.9)
NEUTROS ABS: 5.3 10*3/uL (ref 1.4–6.5)
NEUTROS PCT: 70 %
Platelets: 265 10*3/uL (ref 150–440)
RBC: 3.31 MIL/uL — AB (ref 3.80–5.20)
RDW: 17.7 % — ABNORMAL HIGH (ref 11.5–14.5)
WBC: 7.6 10*3/uL (ref 3.6–11.0)

## 2017-05-13 ENCOUNTER — Telehealth: Payer: Self-pay

## 2017-05-13 ENCOUNTER — Ambulatory Visit (INDEPENDENT_AMBULATORY_CARE_PROVIDER_SITE_OTHER): Payer: Medicare Other | Admitting: Cardiothoracic Surgery

## 2017-05-13 ENCOUNTER — Ambulatory Visit
Admission: RE | Admit: 2017-05-13 | Discharge: 2017-05-13 | Disposition: A | Discharge status: Home / Self Care | Payer: Medicare Other | Source: Ambulatory Visit | Attending: Cardiothoracic Surgery | Admitting: Cardiothoracic Surgery

## 2017-05-13 ENCOUNTER — Other Ambulatory Visit: Payer: Self-pay

## 2017-05-13 ENCOUNTER — Encounter: Payer: Self-pay | Admitting: Cardiothoracic Surgery

## 2017-05-13 VITALS — BP 113/71 | HR 67 | Temp 97.7°F | Ht 62.0 in | Wt 179.2 lb

## 2017-05-13 DIAGNOSIS — I517 Cardiomegaly: Secondary | ICD-10-CM | POA: Insufficient documentation

## 2017-05-13 DIAGNOSIS — J181 Lobar pneumonia, unspecified organism: Secondary | ICD-10-CM

## 2017-05-13 DIAGNOSIS — J9811 Atelectasis: Secondary | ICD-10-CM | POA: Insufficient documentation

## 2017-05-13 DIAGNOSIS — J189 Pneumonia, unspecified organism: Secondary | ICD-10-CM

## 2017-05-13 DIAGNOSIS — J90 Pleural effusion, not elsewhere classified: Secondary | ICD-10-CM | POA: Diagnosis not present

## 2017-05-13 NOTE — Telephone Encounter (Signed)
Spoke with patients daughter. She will call our office tomorrow to schedule 2 week follow up appointment with Dr.Oaks.  Chest xray results reviewed.

## 2017-05-13 NOTE — Patient Instructions (Signed)
Please call our office in an hour to discuss the Radiologist impression of your mothers chest xray. Once you have checked your work schedule please call to make a 1 month follow up appointment.

## 2017-05-13 NOTE — Progress Notes (Signed)
  Patient ID: Kristin Avila, female   DOB: 04-Feb-1928, 83 y.o.   MRN: [id number]  HISTORY: She returns today in follow-up. She remains at the rehabilitation facility where she is undergoing extensive rehabilitation. She states that she believes she is more short of breath today than she was last time she was here. She's had no fevers or chills. There is been no cough or sputum production.   Vitals:   05/13/17 1333  BP: 113/71  Pulse: 67  Temp: 97.7 F (36.5 C)     EXAM:    Resp: Lungs Reveal bilateral rhonchi.  No respiratory distress, normal effort. Heart:  Regular without murmurs Abd:  Abdomen is soft, non distended and non tender. No masses are palpable.  There is no rebound and no guarding.  Neurological: Alert and oriented to person, place, and time. Coordination normal.  Skin: Skin is warm and dry. No rash noted. No diaphoretic. No erythema. No pallor.  Psychiatric: Normal mood and affect. Normal behavior. Judgment and thought content normal.    ASSESSMENT: Chest x-ray today was independently reviewed and continues to show a small amount of pleural fluid and perhaps some compressive atelectasis or infiltrate in the lower aspect of the right hemithorax. It does appear better than it did earlier.   PLAN:   I will give her a 10 day course of Keflex. She has no allergies to penicillin. This is for treatment of a possible bronchitis or pneumonia. We will see her back again in one month with another chest x-ray. She will continue her follow-up with the medical care providers at the rehabilitation facility. I've also asked her daughter to contact me later today to review the official radiology report on the chest x-ray.    Nestor Lewandowsky, MD

## 2017-05-14 ENCOUNTER — Telehealth: Payer: Self-pay

## 2017-05-14 ENCOUNTER — Non-Acute Institutional Stay (SKILLED_NURSING_FACILITY): Payer: Medicare Other | Admitting: Gerontology

## 2017-05-14 ENCOUNTER — Encounter: Payer: Self-pay | Admitting: Gerontology

## 2017-05-14 DIAGNOSIS — D500 Iron deficiency anemia secondary to blood loss (chronic): Secondary | ICD-10-CM | POA: Diagnosis not present

## 2017-05-14 DIAGNOSIS — J90 Pleural effusion, not elsewhere classified: Secondary | ICD-10-CM

## 2017-05-14 DIAGNOSIS — I4891 Unspecified atrial fibrillation: Secondary | ICD-10-CM | POA: Diagnosis not present

## 2017-05-14 NOTE — Progress Notes (Signed)
Opened in error; Disregard.

## 2017-05-14 NOTE — Progress Notes (Signed)
Location:   [facility] Room Number: [phone] Place of Service:  SNF [phone]) Provider:  Toni Arthurs, NP-C  Einar Pheasant, MD  Patient Care Team: Einar Pheasant, MD as PCP - General (Internal Medicine)  Extended Emergency Contact Information Primary Emergency Contact: Osborne Casco of [place] Phone: [phone] Mobile Phone: [phone] Relation: Daughter Secondary Emergency Contact: Ilona Sorrel [place] of [place] Phone: [phone] Relation: Son  Code Status:  FULL Goals of care: Advanced Directive information Advanced Directives 05/14/2017  Does Patient Have a Medical Advance Directive? No  Type of Advance Directive -  Does patient want to make changes to medical advance directive? -  Copy of [place] in Chart? -  Would patient like information on creating a medical advance directive? -     Chief Complaint  Patient presents with  . Medical Management of Chronic Issues    Routine Visit    HPI:  Pt is a 83 y.o. female seen today for medical management of chronic diseases.  Patient was admitted to the facility for rehab following hospitalization for right-sided hemothorax status post thoracoscopy with right-sided chest tubes, acute on chronic anemia, and A. fib.  Patient has been doing well since admission.  Participating with PT and OT.  Patient continues to still have some occasional dyspnea on exertion.  Patient is supposed to follow-up with surgeon today.  Patient does still continue to have some intermittent confusion.  Patient reports she is doing well at this time, no difficulty breathing, no chest pain.  Vital signs stable.  No other complaints   Past Medical History:  Diagnosis Date  . Anemia   . Anxiety 10/02/2012  . Atrial fibrillation ([place]) 11/13   new onset 11/13  . BCC (basal cell carcinoma), face 04/24/2016  . Benign essential hypertension 03/13/2015  . Cellulitis of right  upper extremity 01/27/2017  . Constipation 01/13/2015  . Decreased hearing 05/21/2015  . Health care maintenance 10/14/2014   Mammogram 10/01/15 - Birads I.    . Hypercholesterolemia   . Hyperlipidemia, mixed 02/01/2017  . Hypertension   . Osteoarthritis    hands, feet  . Sick sinus syndrome ([place]) 01/04/2017   Overview:  Sp pacer placement 2018  . Syncope 12/08/2016   Past Surgical History:  Procedure Laterality Date  . APPENDECTOMY  1957  . CATARACT EXTRACTION  2013   left eye  . COLONOSCOPY  09/06/2008  . INCISION AND DRAINAGE ABSCESS Right 01/29/2017   Procedure: INCISION AND DRAINAGE ABSCESS;  Surgeon: Florene Glen, MD;  Location: [facility] ORS;  Service: General;  Laterality: Right;  . KIDNEY SURGERY  age 71  . PACEMAKER INSERTION Left 12/14/2016   Procedure: INSERTION PACEMAKER;  Surgeon: Isaias Cowman, MD;  Location: [facility];  Service: Cardiovascular;  Laterality: Left;  . TUBAL LIGATION    . VIDEO ASSISTED THORACOSCOPY (VATS)/THOROCOTOMY Right 04/19/2017   Procedure: RIGHT THORACOSCOPY AND EVACUATION OF HEMOTHORAX;  Surgeon: Nestor Lewandowsky, MD;  Location: [facility] OR[facility];  Service: General;  Laterality: Right;    No Known Allergies  Allergies as of 05/14/2017   No Known Allergies     Medication List       Accurate as of 05/14/17  4:10 PM. Always use your most recent med list.          acetaminophen 325 MG tablet Commonly known as:  TYLENOL Take 2 tablets (650 mg total) by mouth every 6 (six) hours as needed for mild pain, moderate pain  or fever (or Fever >/= 101).   albuterol (2.5 MG/3ML) 0.083% nebulizer solution Commonly known as:  PROVENTIL Take 3 mLs (2.5 mg total) by nebulization every 2 (two) hours as needed for wheezing.   amiodarone 200 MG tablet Commonly known as:  PACERONE Take 1 tablet (200 mg total) by mouth 2 (two) times daily.   bisacodyl 5 MG EC tablet Commonly known as:  DULCOLAX Take 1 tablet (5 mg total) by mouth daily as needed for moderate  constipation.   donepezil 5 MG tablet Commonly known as:  ARICEPT Take 5 mg by mouth at bedtime.   ENSURE ENLIVE PO Take 1 Bottle by mouth 2 (two) times daily between meals.   feeding supplement (PRO-STAT SUGAR FREE 64) Liqd Take 30 mLs by mouth 2 (two) times daily between meals.   FLUoxetine 40 MG capsule Commonly known as:  PROZAC Take 1 capsule (40 mg total) by mouth daily.   HYDROcodone-acetaminophen 5-325 MG tablet Commonly known as:  NORCO/VICODIN Take 1-2 tablets by mouth every 6 (six) hours as needed for moderate pain.   lovastatin 20 MG tablet Commonly known as:  MEVACOR Take 1 tablet (20 mg total) by mouth daily.   metoprolol succinate 50 MG 24 hr tablet Commonly known as:  TOPROL-XL TAKE (1) TABLET BY MOUTH DAILY. TAKE WITH OR IMMEDIATELY FOLLOWING A MEAL   senna-docusate 8.6-50 MG tablet Commonly known as:  Senokot-S Take 1 tablet by mouth at bedtime as needed for mild constipation.   traZODone 50 MG tablet Commonly known as:  DESYREL Take 1 tablet (50 mg total) by mouth at bedtime as needed for sleep.   Vitamin D-3 1000 units Caps Take 1 capsule by mouth daily.       Review of Systems  Constitutional: Negative for activity change, appetite change, chills, diaphoresis and fever.  HENT: Negative for congestion, sneezing, sore throat, trouble swallowing and voice change.   Eyes: Negative for pain, redness and visual disturbance.  Respiratory: Positive for cough and shortness of breath. Negative for apnea, choking, chest tightness and wheezing.   Cardiovascular: Negative for chest pain, palpitations and leg swelling.  Gastrointestinal: Negative for abdominal distention, abdominal pain, constipation, diarrhea and nausea.  Genitourinary: Negative for difficulty urinating, dysuria, frequency and urgency.  Musculoskeletal: Negative for back pain, gait problem and myalgias. Arthralgias: typical arthritis.  Skin: Negative for color change, pallor, rash and wound.   Neurological: Positive for weakness. Negative for dizziness, tremors, syncope, speech difficulty, numbness and headaches.  Psychiatric/Behavioral: Negative for agitation.  All other systems reviewed and are negative.   Immunization History  Administered Date(s) Administered  . Influenza Split 05/23/2012  . Influenza, High Dose Seasonal PF 05/08/2016  . Influenza,inj,Quad PF,6+ Mos 05/05/2013, 04/06/2014, 05/21/2015  . Influenza-Unspecified 05/21/2012, 04/29/2017  . PPD Test 05/11/2017  . Pneumococcal Conjugate-13 08/11/2011   Pertinent  Health Maintenance Due  Topic Date Due  . DEXA SCAN  06/25/1995  . PNA vac Low Risk Adult (2 of 2 - PPSV23) 08/10/2012  . MAMMOGRAM  09/30/2016  . INFLUENZA VACCINE  Addressed   Fall Risk  10/28/2016 05/08/2016 05/21/2015 01/11/2015 12/01/2013  Falls in the past year? No No No No No   Functional Status Survey:    Vitals:   05/14/17 1603  BP: (!) 117/57  Pulse: 66  Resp: 18  Temp: 97.9 F (36.6 C)  SpO2: 100%  Weight: 178 lb 3.2 oz (80.8 kg)  Height: 5\' 2"  (1.575 m)   Body mass index is 32.59 kg/m. Physical Exam  Constitutional: She is oriented to person, place, and time. Vital signs are normal. She appears well-developed and well-nourished. She is active and cooperative. She does not appear ill. No distress.  HENT:  Head: Normocephalic and atraumatic.  Mouth/Throat: Uvula is midline, oropharynx is clear and moist and mucous membranes are normal. Mucous membranes are not pale, not dry and not cyanotic.  Eyes: Pupils are equal, round, and reactive to light. Conjunctivae, EOM and lids are normal.  Neck: Trachea normal, normal range of motion and full passive range of motion without pain. Neck supple. No JVD present. No tracheal deviation, no edema and no erythema present. No thyromegaly present.  Cardiovascular: Normal rate, regular rhythm, normal heart sounds, intact distal pulses and normal pulses.  Exam reveals no gallop, no distant heart  sounds and no friction rub.   No murmur heard. Pulses:      Dorsalis pedis pulses are 2+ on the right side, and 2+ on the left side.  No edema  Pulmonary/Chest: Effort normal. No accessory muscle usage. No respiratory distress. She has no decreased breath sounds. She has no wheezes. She has no rales. She exhibits no tenderness.  Abdominal: Normal appearance and bowel sounds are normal. She exhibits no distension and no ascites. There is no tenderness.  Musculoskeletal: Normal range of motion. She exhibits no edema or tenderness.  Expected osteoarthritis, stiffness  Neurological: She is alert and oriented to person, place, and time. She has normal strength.  Skin: Skin is warm, dry and intact. No rash noted. She is not diaphoretic. No cyanosis or erythema. No pallor. Nails show no clubbing.  Psychiatric: She has a normal mood and affect. Her speech is normal and behavior is normal. Judgment and thought content normal. Cognition and memory are normal.  Nursing note and vitals reviewed.   Labs reviewed:  Recent Labs  12/08/16 2145  04/19/17 0451  04/23/17 0613 04/27/17 0730 05/11/17 0645  NA  --   < > 139  < > 139 138 133*  K  --   < > 4.0  < > 4.1 4.0 3.2*  CL  --   < > 108  < > 106 106 102  CO2  --   < > 23  < > 24 25 22   GLUCOSE  --   < > 120*  < > 91 96 91  BUN  --   < > 37*  < > 17 18 22*  CREATININE  --   < > 1.49*  < > 0.75 0.85 0.98  CALCIUM  --   < > 8.6*  < > 8.4* 8.6* 8.6*  MG 2.0  --  1.9  --   --   --   --   PHOS  --   --  3.6  --   --   --   --   < > = values in this interval not displayed.  Recent Labs  03/23/17 1212 04/18/17 0526 05/11/17 0645  AST 17 36 29  ALT 9 25 21   ALKPHOS 70 63 88  BILITOT 0.4 0.7 0.7  PROT 6.4 6.7 6.0*  ALBUMIN 4.2 3.7 3.2*    Recent Labs  04/30/17 0400 05/01/17 0607 05/11/17 0645  WBC 7.3 6.9 7.6  NEUTROABS 4.8 4.9 5.3  HGB 7.7* 8.4* 8.5*  HCT 23.5* 25.3* 26.6*  MCV 83.2 84.2 80.4  PLT 268 284 265   Lab Results    Component Value Date   TSH 2.822 04/30/2017   No results found  for: HGBA1C Lab Results  Component Value Date   CHOL 219 (H) 11/26/2016   HDL 91.90 11/26/2016   LDLCALC 113 (H) 11/26/2016   LDLDIRECT 105.8 09/06/2013   TRIG 68.0 11/26/2016   CHOLHDL 2 11/26/2016    Significant Diagnostic Results in last 30 days:  Dg Chest 2 View  Result Date: 05/13/2017 CLINICAL DATA:  Follow-up pneumonia EXAM: CHEST  2 VIEW COMPARISON:  05/06/2017, 04/28/2017, 04/18/2017, 12/14/2016 FINDINGS: Hyperinflation. Persistent small pleural effusions. No change in right lower lung pleural and parenchymal opacity. No change in right middle lobe opacity. Stable cardiomediastinal silhouette, slightly enlarged with atherosclerosis no pneumothorax. Left-sided pacing device as before IMPRESSION: 1. Continued right pleural and parenchymal disease in the right middle lobe and right lower lobe which may reflect combination of loculated pleural effusion and continued pneumonia. No significant change from prior. Continued radiographic follow-up recommended 2. Small left pleural effusion with left basilar atelectasis 3. Mild cardiomegaly Electronically Signed   By: Donavan Foil M.D.   On: 05/13/2017 16:16   Dg Chest 2 View  Result Date: 05/06/2017 CLINICAL DATA:  Cough. EXAM: CHEST  2 VIEW COMPARISON:  Radiographs of April 28, 2017. FINDINGS: Stable cardiomegaly. Atherosclerosis of thoracic aorta is noted. No pneumothorax is noted. Increased right middle lobe opacity is noted concerning for pneumonia or atelectasis with mild associated pleural effusion. Right-sided chest tube has been removed. Left-sided pacemaker is unchanged in position. Minimal left pleural effusion is noted. Bony thorax is unremarkable. IMPRESSION: Aortic atherosclerosis. Stable cardiomegaly. Increased right middle lobe pneumonia or atelectasis is noted with mild associated pleural effusion. Electronically Signed   By: Marijo Conception, M.D.   On:  05/06/2017 17:13   Dg Chest 2 View  Result Date: 04/27/2017 CLINICAL DATA:  Post right thoracotomy for evacuation of hemothorax 1 week ago EXAM: CHEST  2 VIEW COMPARISON:  Chest x-ray of 04/26/2017 FINDINGS: This still appears to be a tiny right apical pneumothorax present with right chest tube noted. Small left pleural effusion remains. Cardiomegaly is stable. Permanent pacemaker is unchanged. IMPRESSION: 1. Little change in tiny right apical pneumothorax. Right chest tube remains. 2. Small left pleural effusion is unchanged. Stable cardiomegaly with pacer. Electronically Signed   By: Ivar Drape M.D.   On: 04/27/2017 08:13   Dg Chest 2 View  Result Date: 04/23/2017 CLINICAL DATA:  Postoperative evaluation.  Thoracoscopy 4 days ago. EXAM: CHEST  2 VIEW COMPARISON:  Yesterday FINDINGS: Small right apical pneumothorax, less than 5%, unchanged. Right-sided chest tubes in stable position, 1 tip at the apex, the other at the base. There is artifact from EKG leads. Dual-chamber pacer from the left. Small left pleural effusion and retrocardiac lung opacity. Stable cardiomegaly. IMPRESSION: Stable small right apical pneumothorax, less than 5%. Chest tubes are in stable position. Electronically Signed   By: Monte Fantasia M.D.   On: 04/23/2017 08:38   Dg Chest 2 View  Result Date: 04/22/2017 CLINICAL DATA:  Status post thoracoscopy and evacuation of a hemothorax on the right 3 days ago. EXAM: CHEST  2 VIEW COMPARISON:  Portable chest x-ray of April 20, 2017 FINDINGS: The lungs are adequately inflated. A tiny right apical pneumothorax amounting to less than 5% of the lung volume is noted. The 2 right-sided chest tubes are in stable position. There is no right pleural effusion. On the left there is basilar atelectasis and small effusion. The heart is enlarged. The pulmonary vascularity is mildly prominent centrally. The ICD is in stable position. There is calcification  in the wall of the aortic arch.  IMPRESSION: 5% or less right apical pneumothorax. Stable positioning of the chest tubes with the uppermost tube tip projecting over the medial aspect of the right fourth posterior rib. Left lower lobe atelectasis or pneumonia increased since the study of 2 days ago periods probable trace left pleural effusion. Thoracic aortic atherosclerosis. Electronically Signed   By: David  Martinique M.D.   On: 04/22/2017 07:24   Dg Chest Port 1 View  Result Date: 04/28/2017 CLINICAL DATA:  Postop, chest tube clamped EXAM: PORTABLE CHEST 1 VIEW COMPARISON:  04/28/2017 at 0739 hours FINDINGS: Tiny right apical pneumothorax, unchanged.  Stable right chest tube. Small left pleural effusion.  No frank interstitial edema. The heart is normal in size.  Left subclavian pacemaker. IMPRESSION: Tiny right apical pneumothorax, unchanged.  Stable right chest tube. Electronically Signed   By: Julian Hy M.D.   On: 04/28/2017 14:09   Dg Chest Port 1 View  Result Date: 04/28/2017 CLINICAL DATA:  Right pneumothorax. EXAM: PORTABLE CHEST 1 VIEW COMPARISON:  04/27/2017 FINDINGS: Single right-sided chest tube remains in place. Tiny residual right apical pneumothorax is essentially unchanged. There is increased accentuation of the interstitial markings at the lung bases suggesting mild interstitial edema. Chronic cardiomegaly. Pacemaker in place. Small persistent left effusion. No appreciable right effusion. Blunting of the right costophrenic angle laterally. IMPRESSION: No change in tiny right apical pneumothorax. New interstitial accentuation suggesting mild pulmonary edema. Small left effusion, stable. Electronically Signed   By: Lorriane Shire M.D.   On: 04/28/2017 08:29   Dg Chest Port 1 View  Result Date: 04/26/2017 CLINICAL DATA:  Status post right thoracoscopy. EXAM: PORTABLE CHEST 1 VIEW COMPARISON:  Radiograph of April 25, 2017. FINDINGS: Stable cardiomegaly. Atherosclerosis of thoracic aorta is noted. Left-sided  pacemaker is unchanged in position. Stable mild right apical pneumothorax is noted. Right-sided chest tube is unchanged in position. Stable bibasilar subsegmental atelectasis with associated pleural effusions is noted. Bony thorax is unremarkable. Degenerative changes seen involving the left glenohumeral joint. IMPRESSION: Aortic atherosclerosis. Stable bibasilar subsegmental atelectasis with associated pleural effusions. Stable mild right apical pneumothorax. Right-sided chest tube is unchanged in position. Electronically Signed   By: Marijo Conception, M.D.   On: 04/26/2017 07:46   Dg Chest Port 1 View  Result Date: 04/25/2017 CLINICAL DATA:  Right-sided chest tube.  Follow-up of pneumothorax. EXAM: PORTABLE CHEST 1 VIEW COMPARISON:  04/24/2017 FINDINGS: Dual lead pacer. Midline trachea. Mild cardiomegaly with transverse aortic atherosclerosis. Right hemidiaphragm elevation. Trace bilateral pleural effusions are similar. Approximately 5-10% right apical pneumothorax identified. Slightly decreased today, with visceral pleural line 10 mm from chest wall versus 13 mm yesterday. right chest tube remains in place. Resolution of congestive heart failure. Bibasilar airspace disease remains. IMPRESSION: Slight decrease in 5-10% right apical pneumothorax. Cardiomegaly with persistent bibasilar atelectasis and probable small bilateral pleural effusions. Electronically Signed   By: Abigail Miyamoto M.D.   On: 04/25/2017 08:58   Dg Chest Port 1 View  Result Date: 04/24/2017 CLINICAL DATA:  Pneumothorax. EXAM: PORTABLE CHEST 1 VIEW COMPARISON:  Chest x-ray from yesterday. FINDINGS: Interval removal of the right apical chest tube. The right basilar chest tube is unchanged in position. The right apical pneumothorax is slightly increased in size, now approximately 10%. Stable cardiomegaly. Unchanged left chest wall AICD. Mild pulmonary vascular congestion. Unchanged left basilar opacity and small bilateral pleural effusions.  IMPRESSION: 1. Interval removal of the right apical chest tube. Small right apical pneumothorax is slightly  increased in size, now approximately 10%. 2. Stable cardiomegaly, mild pulmonary vascular congestion, and small bilateral pleural effusions. Unchanged left basilar atelectasis versus infiltrate. These results will be called to the ordering clinician or representative by the Radiologist Assistant, and communication documented in the PACS or zVision Dashboard. Electronically Signed   By: Titus Dubin M.D.   On: 04/24/2017 08:39   Dg Chest Port 1 View  Result Date: 04/20/2017 CLINICAL DATA:  Postop VATS. EXAM: PORTABLE CHEST 1 VIEW COMPARISON:  04/19/2017 FINDINGS: Right chest tubes remain in place. The more a PICC Lee located chest tube on the prior study appears slightly more inferiorly and medially located on the current examination. The cardiomediastinal silhouette is unchanged. Aortic atherosclerosis and a pacemaker are again noted. There is slightly increased opacity in the right lung base, likely atelectasis. There is likely a trace right pleural effusion. No pneumothorax is identified. There is at most minimal atelectasis in the left lung base. IMPRESSION: 1. Right chest tubes as above.  No pneumothorax. 2. Minimal right basilar atelectasis and likely trace pleural effusion. Electronically Signed   By: Logan Bores M.D.   On: 04/20/2017 09:58   Dg Chest Port 1 View  Result Date: 04/19/2017 CLINICAL DATA:  Respiratory failure, postop EXAM: PORTABLE CHEST 1 VIEW COMPARISON:  04/19/2017 FINDINGS: Right chest tubes are in place. No pneumothorax. Re- aeration of the right lung. Heart is borderline in size. Pacer is unchanged. No confluent opacity currently. IMPRESSION: Placement of right chest tubes without pneumothorax. Re-expansion and clearance of the right lung. Electronically Signed   By: Rolm Baptise M.D.   On: 04/19/2017 17:27   Dg Chest Port 1 View  Result Date: 04/19/2017 CLINICAL DATA:   Status post right-sided thoracentesis EXAM: PORTABLE CHEST 1 VIEW COMPARISON:  April 18, 2017 FINDINGS: No pneumothorax. There is essentially complete opacification of the right hemithorax due to a combination of pleural effusion and consolidation. Left lung is clear. Heart is upper normal in size. Pulmonary vascularity on the left is normal. Pulmonary vascularity on the right is obscured. Pacemaker leads are attached to the right atrium and right ventricle. There is aortic atherosclerosis. Bones are osteoporotic. No focal bone lesions are evident. No adenopathy is appreciable in areas which can be interrogated with respect to appearance of hilum and mediastinum. IMPRESSION: No pneumothorax. Complete opacification of right hemithorax, likely due to combination of large effusion and consolidation/compressive atelectasis. Left lung clear. Stable cardiac silhouette. There is aortic atherosclerosis. Aortic Atherosclerosis (ICD10-I70.0). Electronically Signed   By: Lowella Grip III M.D.   On: 04/19/2017 11:38   Dg Chest Port 1 View  Result Date: 04/18/2017 CLINICAL DATA:  Diffuse body pain. Pacemaker installed last week. Altered mental status. EXAM: PORTABLE CHEST 1 VIEW COMPARISON:  Chest radiograph Dec 14, 2016 FINDINGS: New large RIGHT pleural effusion with sub pulmonic component. RIGHT lung base consolidation. RIGHT midlung zone atelectasis. LEFT lung is clear. Cardiac silhouette predominately obscured on the RIGHT. Calcified aortic knob. No pneumothorax. Dual lead LEFT cardiac pacemaker in situ. Severe LEFT glenohumeral osteoarthrosis. IMPRESSION: New large RIGHT pleural effusion with underlying consolidation, associated compressive atelectasis. Recommend follow-up chest radiograph after treatment to verify improvement. Electronically Signed   By: Elon Alas M.D.   On: 04/18/2017 05:46   Dg Hip Unilat With Pelvis 2-3 Views Left  Result Date: 04/20/2017 CLINICAL DATA:  Left hip anterior hip  pain. EXAM: DG HIP (WITH OR WITHOUT PELVIS) 2-3V LEFT COMPARISON:  None. FINDINGS: An AP view the pelvis with AP  and frogleg views of the left hip are provided. There is lower lumbar degenerative disc disease at L4-5 with associated facet arthropathy. The bony pelvis appears intact with mild osteoarthritic sclerosis of both SI joints along their synovial portion. Moderate size stool ball is seen in the mid pelvis. There is axial joint space narrowing of both hips mild-to-moderate in degree. No acute fracture or suspicious osseous lesions. Vascular calcifications are identified along the course of the left femoral artery. Injection granulomata are noted about the left buttock. IMPRESSION: 1. Mild axial degenerative joint space narrowing of both hips. 2. No acute osseous abnormality of the pelvis and hips. 3. Osteoarthritis of the SI joints. 4. Lower lumbar degenerative disc disease. Electronically Signed   By: Ashley Royalty M.D.   On: 04/20/2017 14:31   US Thoracentesis Asp Pleural Space W/img Guide  Result Date: 04/19/2017 INDICATION: Symptomatic right-sided pleural effusion PROCEDURE: ULTRASOUND GUIDED right THORACENTESIS COMPARISON:  None. MEDICATIONS: 10 cc 1% lidocaine. COMPLICATIONS: None immediate. TECHNIQUE: Informed written consent was obtained from the patient after a discussion of the risks, benefits and alternatives to treatment. A timeout was performed prior to the initiation of the procedure. Initial ultrasound scanning demonstrates a right pleural effusion. The lower chest was prepped and draped in the usual sterile fashion. 1% lidocaine was used for local anesthesia. Under direct ultrasound guidance, a 19 gauge, 7-cm, Yueh catheter was introduced. An ultrasound image was saved for documentation purposes. The thoracentesis was performed. The catheter was removed and a dressing was applied. The patient tolerated the procedure well without immediate post procedural complication. The patient was  escorted to have an upright chest radiograph. FINDINGS: A total of approximately 75 cc of thick bloody fluid was removed. Requested samples were sent to the laboratory. IMPRESSION: Successful ultrasound-guided right sided thoracentesis yielding 75 cc of thick bloody pleural fluid. : Read by Lavonia Drafts [facility] Electronically Signed   By: Inez Catalina M.D.   On: 04/19/2017 11:54    Assessment/Plan 1.  Pleural effusion on the right  Okay to start Keflex 500 mg p.o. 3 times daily times 10 days  Continue albuterol 0.083% nebulizer 2.5 mg IH q. 2-hour as needed  Continue hydrocodone/acetaminophen 5/325 mg 1-2 tablets p.o. every 6 hours as needed pain  2.  Atrial fibrillation, unspecified type  Stable  Continue amiodarone 200 mg p.o. twice daily  Continue metoprolol 50 mg p.o. daily for rate control  Follow-up with cardiologist ASAP continuity of care  3.  Iron deficiency anemia due to chronic blood loss  Continue ferrous sulfate 325 mg p.o. twice daily with meals   Family/ staff Communication:   Total Time:  Documentation:  Face to Face:  Family/Phone:   Labs/tests ordered:    Medication list reviewed and assessed for continued appropriateness. Monthly medication orders reviewed and signed.  Vikki Ports, NP-C [facility] [phone], [place] [address] Cell Phone (Mon-Fri 8am-5pm):  [phone] On Call:  [phone] & follow prompts after 5pm & weekends Office Phone:  [phone] Office Fax:  [phone]

## 2017-05-14 NOTE — Telephone Encounter (Signed)
Spoke with patient's daughter at this time.  She had questions regarding the Antibiotic- wanted to know the name- that was prescribed to her mother and the chest xray results. Impression from chest xray result was read and daughter verbalized understanding.

## 2017-05-18 ENCOUNTER — Non-Acute Institutional Stay (SKILLED_NURSING_FACILITY): Payer: Medicare Other | Admitting: Gerontology

## 2017-05-18 ENCOUNTER — Encounter: Payer: Self-pay | Admitting: Gerontology

## 2017-05-18 DIAGNOSIS — D500 Iron deficiency anemia secondary to blood loss (chronic): Secondary | ICD-10-CM

## 2017-05-18 DIAGNOSIS — I4891 Unspecified atrial fibrillation: Secondary | ICD-10-CM

## 2017-05-18 DIAGNOSIS — J90 Pleural effusion, not elsewhere classified: Secondary | ICD-10-CM

## 2017-05-18 NOTE — Progress Notes (Signed)
Location:   [facility] Room Number: [phone] Place of Service:  SNF [phone]) Provider:  Toni Arthurs, NP-C  Einar Pheasant, MD  Patient Care Team: Einar Pheasant, MD as PCP - General (Internal Medicine)  Extended Emergency Contact Information Primary Emergency Contact: Osborne Casco of [place] Phone: [phone] Mobile Phone: [phone] Relation: Daughter Secondary Emergency Contact: Ilona Sorrel [place] of [place] Phone: [phone] Relation: Son  Code Status:  FULL Goals of care: Advanced Directive information Advanced Directives 05/18/2017  Does Patient Have a Medical Advance Directive? No  Type of Advance Directive -  Does patient want to make changes to medical advance directive? -  Copy of [place] in Chart? -  Would patient like information on creating a medical advance directive? -     Chief Complaint  Patient presents with  . Medical Management of Chronic Issues    Routine Visit    HPI:  Pt is a 83 y.o. female seen today for medical management of chronic diseases.  Patient was admitted to the facility for rehab following hospitalization for right-sided hemothorax, status post thoracoscopy with right sided chest tubes, acute on chronic anemia, and A. fib.  Patient has been doing well since admission.  Participating with PT and OT.  Patient continues to still have some occasional dyspnea on exertion, the patient reports this has improved.  Patient reports she is doing well at this time, no difficulty breathing, no chest pain.  Patient is smiling and is looking well.  Vital signs stable.  No other complaints.   Past Medical History:  Diagnosis Date  . Anemia   . Anxiety 10/02/2012  . Atrial fibrillation ([place]) 11/13   new onset 11/13  . BCC (basal cell carcinoma), face 04/24/2016  . Benign essential hypertension 03/13/2015  . Cellulitis of right upper extremity 01/27/2017  .  Constipation 01/13/2015  . Decreased hearing 05/21/2015  . Health care maintenance 10/14/2014   Mammogram 10/01/15 - Birads I.    . Hypercholesterolemia   . Hyperlipidemia, mixed 02/01/2017  . Hypertension   . Osteoarthritis    hands, feet  . Sick sinus syndrome ([place]) 01/04/2017   Overview:  Sp pacer placement 2018  . Syncope 12/08/2016   Past Surgical History:  Procedure Laterality Date  . APPENDECTOMY  1957  . CATARACT EXTRACTION  2013   left eye  . COLONOSCOPY  09/06/2008  . INCISION AND DRAINAGE ABSCESS Right 01/29/2017   Procedure: INCISION AND DRAINAGE ABSCESS;  Surgeon: Florene Glen, MD;  Location: [facility] ORS;  Service: General;  Laterality: Right;  . KIDNEY SURGERY  age 68  . PACEMAKER INSERTION Left 12/14/2016   Procedure: INSERTION PACEMAKER;  Surgeon: Isaias Cowman, MD;  Location: [facility] ORS;  Service: Cardiovascular;  Laterality: Left;  . TUBAL LIGATION    . VIDEO ASSISTED THORACOSCOPY (VATS)/THOROCOTOMY Right 04/19/2017   Procedure: RIGHT THORACOSCOPY AND EVACUATION OF HEMOTHORAX;  Surgeon: Nestor Lewandowsky, MD;  Location: [facility] ORS;  Service: General;  Laterality: Right;    No Known Allergies  Allergies as of 05/18/2017   No Known Allergies     Medication List       Accurate as of 05/18/17  4:34 PM. Always use your most recent med list.          acetaminophen 325 MG tablet Commonly known as:  TYLENOL Take 2 tablets (650 mg total) by mouth every 6 (six) hours as needed for mild pain, moderate pain or fever (or Fever >/=  101).   albuterol (2.5 MG/3ML) 0.083% nebulizer solution Commonly known as:  PROVENTIL Take 3 mLs (2.5 mg total) by nebulization every 2 (two) hours as needed for wheezing.   amiodarone 200 MG tablet Commonly known as:  PACERONE Take 1 tablet (200 mg total) by mouth 2 (two) times daily.   bisacodyl 5 MG EC tablet Commonly known as:  DULCOLAX Take 1 tablet (5 mg total) by mouth daily as needed for moderate constipation.   cephALEXin 500 MG  capsule Commonly known as:  KEFLEX Take 500 mg by mouth 3 (three) times daily.   ENSURE ENLIVE PO Take 1 Bottle by mouth 2 (two) times daily between meals.   feeding supplement (PRO-STAT SUGAR FREE 64) Liqd Take 30 mLs by mouth 2 (two) times daily between meals.   FLUoxetine 40 MG capsule Commonly known as:  PROZAC Take 1 capsule (40 mg total) by mouth daily.   HYDROcodone-acetaminophen 5-325 MG tablet Commonly known as:  NORCO/VICODIN Take 1-2 tablets by mouth every 6 (six) hours as needed for moderate pain.   lovastatin 20 MG tablet Commonly known as:  MEVACOR Take 1 tablet (20 mg total) by mouth daily.   metoprolol succinate 50 MG 24 hr tablet Commonly known as:  TOPROL-XL TAKE (1) TABLET BY MOUTH DAILY. TAKE WITH OR IMMEDIATELY FOLLOWING A MEAL   senna-docusate 8.6-50 MG tablet Commonly known as:  Senokot-S Take 1 tablet by mouth at bedtime as needed for mild constipation.   torsemide 5 MG tablet Commonly known as:  DEMADEX Take 5 mg by mouth daily.   traZODone 50 MG tablet Commonly known as:  DESYREL Take 1 tablet (50 mg total) by mouth at bedtime as needed for sleep.   Vitamin D-3 1000 units Caps Take 1 capsule by mouth daily.       Review of Systems  Constitutional: Negative for activity change, appetite change, chills, diaphoresis and fever.  HENT: Negative for congestion, sneezing, sore throat, trouble swallowing and voice change.   Respiratory: Positive for cough (improved). Negative for apnea, choking, chest tightness, shortness of breath and wheezing.   Cardiovascular: Negative for chest pain, palpitations and leg swelling.  Gastrointestinal: Negative for abdominal distention, abdominal pain, constipation, diarrhea and nausea.  Genitourinary: Negative for difficulty urinating, dysuria, frequency and urgency.  Musculoskeletal: Negative for back pain, gait problem and myalgias. Arthralgias: typical arthritis.  Skin: Negative for color change, pallor, rash  and wound.  Neurological: Positive for weakness. Negative for dizziness, tremors, syncope, speech difficulty, numbness and headaches.  Psychiatric/Behavioral: Negative for agitation.  All other systems reviewed and are negative.   Immunization History  Administered Date(s) Administered  . Influenza Split 05/23/2012  . Influenza, High Dose Seasonal PF 05/08/2016  . Influenza,inj,Quad PF,6+ Mos 05/05/2013, 04/06/2014, 05/21/2015  . Influenza-Unspecified 05/21/2012, 04/29/2017  . PPD Test 05/11/2017  . Pneumococcal Conjugate-13 08/11/2011   Pertinent  Health Maintenance Due  Topic Date Due  . DEXA SCAN  06/25/1995  . PNA vac Low Risk Adult (2 of 2 - PPSV23) 08/10/2012  . MAMMOGRAM  09/30/2016  . INFLUENZA VACCINE  Addressed   Fall Risk  10/28/2016 05/08/2016 05/21/2015 01/11/2015 12/01/2013  Falls in the past year? No No No No No   Functional Status Survey:    Vitals:   05/18/17 1628  BP: (!) 135/58  Pulse: 68  Resp: 18  Temp: 98.5 F (36.9 C)  SpO2: 100%  Weight: 182 lb 4.8 oz (82.7 kg)  Height: 5\' 2"  (1.575 m)   Body mass index  is 33.34 kg/m. Physical Exam  Constitutional: She is oriented to person, place, and time. Vital signs are normal. She appears well-developed and well-nourished. She is active and cooperative. She does not appear ill. No distress.  HENT:  Head: Normocephalic and atraumatic.  Mouth/Throat: Uvula is midline, oropharynx is clear and moist and mucous membranes are normal. Mucous membranes are not pale, not dry and not cyanotic.  Eyes: Pupils are equal, round, and reactive to light. Conjunctivae, EOM and lids are normal.  Neck: Trachea normal, normal range of motion and full passive range of motion without pain. Neck supple. No JVD present. No tracheal deviation, no edema and no erythema present. No thyromegaly present.  Cardiovascular: Normal rate, regular rhythm, normal heart sounds, intact distal pulses and normal pulses.  Exam reveals no gallop, no  distant heart sounds and no friction rub.   No murmur heard. Pulses:      Dorsalis pedis pulses are 2+ on the right side, and 2+ on the left side.  No edema  Pulmonary/Chest: Effort normal. No accessory muscle usage. No respiratory distress. She has no decreased breath sounds. She has no wheezes. She has no rhonchi. She has no rales. She exhibits no tenderness.  Abdominal: Normal appearance and bowel sounds are normal. She exhibits no distension and no ascites. There is no tenderness.  Musculoskeletal: Normal range of motion. She exhibits no edema or tenderness.  Expected osteoarthritis, stiffness  Neurological: She is alert and oriented to person, place, and time. She has normal strength.  Skin: Skin is warm, dry and intact. No rash noted. She is not diaphoretic. No cyanosis or erythema. No pallor. Nails show no clubbing.  Psychiatric: She has a normal mood and affect. Her speech is normal and behavior is normal. Judgment and thought content normal. Cognition and memory are normal.  Nursing note and vitals reviewed.   Labs reviewed:  Recent Labs  12/08/16 2145  04/19/17 0451  04/23/17 0613 04/27/17 0730 05/11/17 0645  NA  --   < > 139  < > 139 138 133*  K  --   < > 4.0  < > 4.1 4.0 3.2*  CL  --   < > 108  < > 106 106 102  CO2  --   < > 23  < > 24 25 22   GLUCOSE  --   < > 120*  < > 91 96 91  BUN  --   < > 37*  < > 17 18 22*  CREATININE  --   < > 1.49*  < > 0.75 0.85 0.98  CALCIUM  --   < > 8.6*  < > 8.4* 8.6* 8.6*  MG 2.0  --  1.9  --   --   --   --   PHOS  --   --  3.6  --   --   --   --   < > = values in this interval not displayed.  Recent Labs  03/23/17 1212 04/18/17 0526 05/11/17 0645  AST 17 36 29  ALT 9 25 21   ALKPHOS 70 63 88  BILITOT 0.4 0.7 0.7  PROT 6.4 6.7 6.0*  ALBUMIN 4.2 3.7 3.2*    Recent Labs  04/30/17 0400 05/01/17 0607 05/11/17 0645  WBC 7.3 6.9 7.6  NEUTROABS 4.8 4.9 5.3  HGB 7.7* 8.4* 8.5*  HCT 23.5* 25.3* 26.6*  MCV 83.2 84.2 80.4  PLT  268 284 265   Lab Results  Component Value Date  TSH 2.822 04/30/2017   No results found for: HGBA1C Lab Results  Component Value Date   CHOL 219 (H) 11/26/2016   HDL 91.90 11/26/2016   LDLCALC 113 (H) 11/26/2016   LDLDIRECT 105.8 09/06/2013   TRIG 68.0 11/26/2016   CHOLHDL 2 11/26/2016    Significant Diagnostic Results in last 30 days:  Dg Chest 2 View  Result Date: 05/13/2017 CLINICAL DATA:  Follow-up pneumonia EXAM: CHEST  2 VIEW COMPARISON:  05/06/2017, 04/28/2017, 04/18/2017, 12/14/2016 FINDINGS: Hyperinflation. Persistent small pleural effusions. No change in right lower lung pleural and parenchymal opacity. No change in right middle lobe opacity. Stable cardiomediastinal silhouette, slightly enlarged with atherosclerosis no pneumothorax. Left-sided pacing device as before IMPRESSION: 1. Continued right pleural and parenchymal disease in the right middle lobe and right lower lobe which may reflect combination of loculated pleural effusion and continued pneumonia. No significant change from prior. Continued radiographic follow-up recommended 2. Small left pleural effusion with left basilar atelectasis 3. Mild cardiomegaly Electronically Signed   By: Donavan Foil M.D.   On: 05/13/2017 16:16   Dg Chest 2 View  Result Date: 05/06/2017 CLINICAL DATA:  Cough. EXAM: CHEST  2 VIEW COMPARISON:  Radiographs of April 28, 2017. FINDINGS: Stable cardiomegaly. Atherosclerosis of thoracic aorta is noted. No pneumothorax is noted. Increased right middle lobe opacity is noted concerning for pneumonia or atelectasis with mild associated pleural effusion. Right-sided chest tube has been removed. Left-sided pacemaker is unchanged in position. Minimal left pleural effusion is noted. Bony thorax is unremarkable. IMPRESSION: Aortic atherosclerosis. Stable cardiomegaly. Increased right middle lobe pneumonia or atelectasis is noted with mild associated pleural effusion. Electronically Signed   By: Marijo Conception, M.D.   On: 05/06/2017 17:13   Dg Chest 2 View  Result Date: 04/27/2017 CLINICAL DATA:  Post right thoracotomy for evacuation of hemothorax 1 week ago EXAM: CHEST  2 VIEW COMPARISON:  Chest x-ray of 04/26/2017 FINDINGS: This still appears to be a tiny right apical pneumothorax present with right chest tube noted. Small left pleural effusion remains. Cardiomegaly is stable. Permanent pacemaker is unchanged. IMPRESSION: 1. Little change in tiny right apical pneumothorax. Right chest tube remains. 2. Small left pleural effusion is unchanged. Stable cardiomegaly with pacer. Electronically Signed   By: Ivar Drape M.D.   On: 04/27/2017 08:13   Dg Chest 2 View  Result Date: 04/23/2017 CLINICAL DATA:  Postoperative evaluation.  Thoracoscopy 4 days ago. EXAM: CHEST  2 VIEW COMPARISON:  Yesterday FINDINGS: Small right apical pneumothorax, less than 5%, unchanged. Right-sided chest tubes in stable position, 1 tip at the apex, the other at the base. There is artifact from EKG leads. Dual-chamber pacer from the left. Small left pleural effusion and retrocardiac lung opacity. Stable cardiomegaly. IMPRESSION: Stable small right apical pneumothorax, less than 5%. Chest tubes are in stable position. Electronically Signed   By: Monte Fantasia M.D.   On: 04/23/2017 08:38   Dg Chest 2 View  Result Date: 04/22/2017 CLINICAL DATA:  Status post thoracoscopy and evacuation of a hemothorax on the right 3 days ago. EXAM: CHEST  2 VIEW COMPARISON:  Portable chest x-ray of April 20, 2017 FINDINGS: The lungs are adequately inflated. A tiny right apical pneumothorax amounting to less than 5% of the lung volume is noted. The 2 right-sided chest tubes are in stable position. There is no right pleural effusion. On the left there is basilar atelectasis and small effusion. The heart is enlarged. The pulmonary vascularity is mildly prominent centrally. The  ICD is in stable position. There is calcification in the wall of the  aortic arch. IMPRESSION: 5% or less right apical pneumothorax. Stable positioning of the chest tubes with the uppermost tube tip projecting over the medial aspect of the right fourth posterior rib. Left lower lobe atelectasis or pneumonia increased since the study of 2 days ago periods probable trace left pleural effusion. Thoracic aortic atherosclerosis. Electronically Signed   By: David  Martinique M.D.   On: 04/22/2017 07:24   Dg Chest Port 1 View  Result Date: 04/28/2017 CLINICAL DATA:  Postop, chest tube clamped EXAM: PORTABLE CHEST 1 VIEW COMPARISON:  04/28/2017 at 0739 hours FINDINGS: Tiny right apical pneumothorax, unchanged.  Stable right chest tube. Small left pleural effusion.  No frank interstitial edema. The heart is normal in size.  Left subclavian pacemaker. IMPRESSION: Tiny right apical pneumothorax, unchanged.  Stable right chest tube. Electronically Signed   By: Julian Hy M.D.   On: 04/28/2017 14:09   Dg Chest Port 1 View  Result Date: 04/28/2017 CLINICAL DATA:  Right pneumothorax. EXAM: PORTABLE CHEST 1 VIEW COMPARISON:  04/27/2017 FINDINGS: Single right-sided chest tube remains in place. Tiny residual right apical pneumothorax is essentially unchanged. There is increased accentuation of the interstitial markings at the lung bases suggesting mild interstitial edema. Chronic cardiomegaly. Pacemaker in place. Small persistent left effusion. No appreciable right effusion. Blunting of the right costophrenic angle laterally. IMPRESSION: No change in tiny right apical pneumothorax. New interstitial accentuation suggesting mild pulmonary edema. Small left effusion, stable. Electronically Signed   By: Lorriane Shire M.D.   On: 04/28/2017 08:29   Dg Chest Port 1 View  Result Date: 04/26/2017 CLINICAL DATA:  Status post right thoracoscopy. EXAM: PORTABLE CHEST 1 VIEW COMPARISON:  Radiograph of April 25, 2017. FINDINGS: Stable cardiomegaly. Atherosclerosis of thoracic aorta is noted.  Left-sided pacemaker is unchanged in position. Stable mild right apical pneumothorax is noted. Right-sided chest tube is unchanged in position. Stable bibasilar subsegmental atelectasis with associated pleural effusions is noted. Bony thorax is unremarkable. Degenerative changes seen involving the left glenohumeral joint. IMPRESSION: Aortic atherosclerosis. Stable bibasilar subsegmental atelectasis with associated pleural effusions. Stable mild right apical pneumothorax. Right-sided chest tube is unchanged in position. Electronically Signed   By: Marijo Conception, M.D.   On: 04/26/2017 07:46   Dg Chest Port 1 View  Result Date: 04/25/2017 CLINICAL DATA:  Right-sided chest tube.  Follow-up of pneumothorax. EXAM: PORTABLE CHEST 1 VIEW COMPARISON:  04/24/2017 FINDINGS: Dual lead pacer. Midline trachea. Mild cardiomegaly with transverse aortic atherosclerosis. Right hemidiaphragm elevation. Trace bilateral pleural effusions are similar. Approximately 5-10% right apical pneumothorax identified. Slightly decreased today, with visceral pleural line 10 mm from chest wall versus 13 mm yesterday. right chest tube remains in place. Resolution of congestive heart failure. Bibasilar airspace disease remains. IMPRESSION: Slight decrease in 5-10% right apical pneumothorax. Cardiomegaly with persistent bibasilar atelectasis and probable small bilateral pleural effusions. Electronically Signed   By: Abigail Miyamoto M.D.   On: 04/25/2017 08:58   Dg Chest Port 1 View  Result Date: 04/24/2017 CLINICAL DATA:  Pneumothorax. EXAM: PORTABLE CHEST 1 VIEW COMPARISON:  Chest x-ray from yesterday. FINDINGS: Interval removal of the right apical chest tube. The right basilar chest tube is unchanged in position. The right apical pneumothorax is slightly increased in size, now approximately 10%. Stable cardiomegaly. Unchanged left chest wall AICD. Mild pulmonary vascular congestion. Unchanged left basilar opacity and small bilateral pleural  effusions. IMPRESSION: 1. Interval removal of the right apical  chest tube. Small right apical pneumothorax is slightly increased in size, now approximately 10%. 2. Stable cardiomegaly, mild pulmonary vascular congestion, and small bilateral pleural effusions. Unchanged left basilar atelectasis versus infiltrate. These results will be called to the ordering clinician or representative by the Radiologist Assistant, and communication documented in the PACS or zVision Dashboard. Electronically Signed   By: Titus Dubin M.D.   On: 04/24/2017 08:39   Dg Chest Port 1 View  Result Date: 04/20/2017 CLINICAL DATA:  Postop VATS. EXAM: PORTABLE CHEST 1 VIEW COMPARISON:  04/19/2017 FINDINGS: Right chest tubes remain in place. The more a PICC Lee located chest tube on the prior study appears slightly more inferiorly and medially located on the current examination. The cardiomediastinal silhouette is unchanged. Aortic atherosclerosis and a pacemaker are again noted. There is slightly increased opacity in the right lung base, likely atelectasis. There is likely a trace right pleural effusion. No pneumothorax is identified. There is at most minimal atelectasis in the left lung base. IMPRESSION: 1. Right chest tubes as above.  No pneumothorax. 2. Minimal right basilar atelectasis and likely trace pleural effusion. Electronically Signed   By: Logan Bores M.D.   On: 04/20/2017 09:58   Dg Chest Port 1 View  Result Date: 04/19/2017 CLINICAL DATA:  Respiratory failure, postop EXAM: PORTABLE CHEST 1 VIEW COMPARISON:  04/19/2017 FINDINGS: Right chest tubes are in place. No pneumothorax. Re- aeration of the right lung. Heart is borderline in size. Pacer is unchanged. No confluent opacity currently. IMPRESSION: Placement of right chest tubes without pneumothorax. Re-expansion and clearance of the right lung. Electronically Signed   By: Rolm Baptise M.D.   On: 04/19/2017 17:27   Dg Chest Port 1 View  Result Date:  04/19/2017 CLINICAL DATA:  Status post right-sided thoracentesis EXAM: PORTABLE CHEST 1 VIEW COMPARISON:  April 18, 2017 FINDINGS: No pneumothorax. There is essentially complete opacification of the right hemithorax due to a combination of pleural effusion and consolidation. Left lung is clear. Heart is upper normal in size. Pulmonary vascularity on the left is normal. Pulmonary vascularity on the right is obscured. Pacemaker leads are attached to the right atrium and right ventricle. There is aortic atherosclerosis. Bones are osteoporotic. No focal bone lesions are evident. No adenopathy is appreciable in areas which can be interrogated with respect to appearance of hilum and mediastinum. IMPRESSION: No pneumothorax. Complete opacification of right hemithorax, likely due to combination of large effusion and consolidation/compressive atelectasis. Left lung clear. Stable cardiac silhouette. There is aortic atherosclerosis. Aortic Atherosclerosis (ICD10-I70.0). Electronically Signed   By: Lowella Grip III M.D.   On: 04/19/2017 11:38   Dg Hip Unilat With Pelvis 2-3 Views Left  Result Date: 04/20/2017 CLINICAL DATA:  Left hip anterior hip pain. EXAM: DG HIP (WITH OR WITHOUT PELVIS) 2-3V LEFT COMPARISON:  None. FINDINGS: An AP view the pelvis with AP and frogleg views of the left hip are provided. There is lower lumbar degenerative disc disease at L4-5 with associated facet arthropathy. The bony pelvis appears intact with mild osteoarthritic sclerosis of both SI joints along their synovial portion. Moderate size stool ball is seen in the mid pelvis. There is axial joint space narrowing of both hips mild-to-moderate in degree. No acute fracture or suspicious osseous lesions. Vascular calcifications are identified along the course of the left femoral artery. Injection granulomata are noted about the left buttock. IMPRESSION: 1. Mild axial degenerative joint space narrowing of both hips. 2. No acute osseous  abnormality of the pelvis and  hips. 3. Osteoarthritis of the SI joints. 4. Lower lumbar degenerative disc disease. Electronically Signed   By: Ashley Royalty M.D.   On: 04/20/2017 14:31   US Thoracentesis Asp Pleural Space W/img Guide  Result Date: 04/19/2017 INDICATION: Symptomatic right-sided pleural effusion PROCEDURE: ULTRASOUND GUIDED right THORACENTESIS COMPARISON:  None. MEDICATIONS: 10 cc 1% lidocaine. COMPLICATIONS: None immediate. TECHNIQUE: Informed written consent was obtained from the patient after a discussion of the risks, benefits and alternatives to treatment. A timeout was performed prior to the initiation of the procedure. Initial ultrasound scanning demonstrates a right pleural effusion. The lower chest was prepped and draped in the usual sterile fashion. 1% lidocaine was used for local anesthesia. Under direct ultrasound guidance, a 19 gauge, 7-cm, Yueh catheter was introduced. An ultrasound image was saved for documentation purposes. The thoracentesis was performed. The catheter was removed and a dressing was applied. The patient tolerated the procedure well without immediate post procedural complication. The patient was escorted to have an upright chest radiograph. FINDINGS: A total of approximately 75 cc of thick bloody fluid was removed. Requested samples were sent to the laboratory. IMPRESSION: Successful ultrasound-guided right sided thoracentesis yielding 75 cc of thick bloody pleural fluid. : Read by Lavonia Drafts [facility] Electronically Signed   By: Inez Catalina M.D.   On: 04/19/2017 11:54    Assessment/Plan 1.  Pleural effusion on the right  Continue Keflex 500 mg p.o. 3 times daily until 10-day course is complete  Continue albuterol 0.083% nebulizer 2.5 mg inhaled every 2 hours as needed  Continue hydrocodone/acetaminophen 5/325 mg 1-2 tablets p.o. every 6 hours as needed pain  Continue PT/OT for weakness and deconditioning  2.  Atrial fibrillation, unspecified  type  Stable  Continue amiodarone 200 mg p.o. twice daily  Continue metoprolol 50 mg p.o. daily for rate control  Follow-up with cardiologist ASAP for continuity of care  3.  Iron deficiency anemia due to chronic blood loss  Continue ferrous sulfate 325 mg p.o. twice daily with meals   Family/ staff Communication:   Total Time:  Documentation:  Face to Face:  Family/Phone:   Labs/tests ordered:    Medication list reviewed and assessed for continued appropriateness. Monthly medication orders reviewed and signed.  Vikki Ports, NP-C Geriatrics [facility] [phone], [place] [address] Cell Phone (Mon-Fri 8am-5pm):  [phone] On Call:  [phone] & follow prompts after 5pm & weekends Office Phone:  [phone] Office Fax:  [phone]

## 2017-05-19 ENCOUNTER — Encounter: Payer: Self-pay | Admitting: Emergency Medicine

## 2017-05-19 ENCOUNTER — Inpatient Hospital Stay
Admission: EM | Admit: 2017-05-19 | Discharge: 2017-05-24 | DRG: 299 | Disposition: A | Discharge status: Home Health | Payer: Medicare Other | Attending: Internal Medicine | Admitting: Internal Medicine

## 2017-05-19 ENCOUNTER — Encounter: Payer: Self-pay | Admitting: Gerontology

## 2017-05-19 ENCOUNTER — Other Ambulatory Visit
Admission: RE | Admit: 2017-05-19 | Discharge: 2017-05-19 | Disposition: A | Discharge status: Home / Self Care | Payer: Medicare Other | Source: Skilled Nursing Facility | Attending: Internal Medicine | Admitting: Internal Medicine

## 2017-05-19 ENCOUNTER — Emergency Department: Payer: Medicare Other

## 2017-05-19 ENCOUNTER — Other Ambulatory Visit: Payer: Self-pay

## 2017-05-19 ENCOUNTER — Non-Acute Institutional Stay (SKILLED_NURSING_FACILITY): Payer: Medicare Other | Admitting: Gerontology

## 2017-05-19 DIAGNOSIS — R600 Localized edema: Secondary | ICD-10-CM

## 2017-05-19 DIAGNOSIS — R5383 Other fatigue: Secondary | ICD-10-CM | POA: Diagnosis not present

## 2017-05-19 DIAGNOSIS — Y838 Other surgical procedures as the cause of abnormal reaction of the patient, or of later complication, without mention of misadventure at the time of the procedure: Secondary | ICD-10-CM | POA: Diagnosis present

## 2017-05-19 DIAGNOSIS — E876 Hypokalemia: Secondary | ICD-10-CM | POA: Diagnosis present

## 2017-05-19 DIAGNOSIS — R5381 Other malaise: Secondary | ICD-10-CM | POA: Insufficient documentation

## 2017-05-19 DIAGNOSIS — I480 Paroxysmal atrial fibrillation: Secondary | ICD-10-CM | POA: Diagnosis present

## 2017-05-19 DIAGNOSIS — E782 Mixed hyperlipidemia: Secondary | ICD-10-CM | POA: Diagnosis present

## 2017-05-19 DIAGNOSIS — R609 Edema, unspecified: Secondary | ICD-10-CM | POA: Diagnosis not present

## 2017-05-19 DIAGNOSIS — H9190 Unspecified hearing loss, unspecified ear: Secondary | ICD-10-CM | POA: Diagnosis present

## 2017-05-19 DIAGNOSIS — I2699 Other pulmonary embolism without acute cor pulmonale: Secondary | ICD-10-CM | POA: Diagnosis present

## 2017-05-19 DIAGNOSIS — I82622 Acute embolism and thrombosis of deep veins of left upper extremity: Secondary | ICD-10-CM | POA: Diagnosis not present

## 2017-05-19 DIAGNOSIS — I4891 Unspecified atrial fibrillation: Secondary | ICD-10-CM | POA: Diagnosis present

## 2017-05-19 DIAGNOSIS — D509 Iron deficiency anemia, unspecified: Secondary | ICD-10-CM

## 2017-05-19 DIAGNOSIS — K449 Diaphragmatic hernia without obstruction or gangrene: Secondary | ICD-10-CM | POA: Diagnosis present

## 2017-05-19 DIAGNOSIS — Y95 Nosocomial condition: Secondary | ICD-10-CM | POA: Diagnosis present

## 2017-05-19 DIAGNOSIS — K641 Second degree hemorrhoids: Secondary | ICD-10-CM | POA: Diagnosis present

## 2017-05-19 DIAGNOSIS — Z950 Presence of cardiac pacemaker: Secondary | ICD-10-CM

## 2017-05-19 DIAGNOSIS — I82409 Acute embolism and thrombosis of unspecified deep veins of unspecified lower extremity: Secondary | ICD-10-CM | POA: Diagnosis present

## 2017-05-19 DIAGNOSIS — D500 Iron deficiency anemia secondary to blood loss (chronic): Secondary | ICD-10-CM | POA: Diagnosis present

## 2017-05-19 DIAGNOSIS — F329 Major depressive disorder, single episode, unspecified: Secondary | ICD-10-CM | POA: Diagnosis present

## 2017-05-19 DIAGNOSIS — I871 Compression of vein: Secondary | ICD-10-CM | POA: Diagnosis present

## 2017-05-19 DIAGNOSIS — I10 Essential (primary) hypertension: Secondary | ICD-10-CM | POA: Diagnosis present

## 2017-05-19 DIAGNOSIS — Z79899 Other long term (current) drug therapy: Secondary | ICD-10-CM

## 2017-05-19 DIAGNOSIS — Z85828 Personal history of other malignant neoplasm of skin: Secondary | ICD-10-CM

## 2017-05-19 DIAGNOSIS — J189 Pneumonia, unspecified organism: Secondary | ICD-10-CM | POA: Diagnosis present

## 2017-05-19 DIAGNOSIS — T82897A Other specified complication of cardiac prosthetic devices, implants and grafts, initial encounter: Secondary | ICD-10-CM | POA: Diagnosis present

## 2017-05-19 LAB — FUNGUS CULTURE WITH STAIN

## 2017-05-19 LAB — CBC WITH DIFFERENTIAL/PLATELET
BASOS ABS: 0 10*3/uL (ref 0–0.1)
BASOS PCT: 0 %
Basophils Absolute: 0.1 10*3/uL (ref 0–0.1)
Basophils Relative: 0 %
EOS ABS: 0 10*3/uL (ref 0–0.7)
EOS ABS: 0.1 10*3/uL (ref 0–0.7)
EOS PCT: 0 %
EOS PCT: 0 %
HCT: 28.4 % — ABNORMAL LOW (ref 35.0–47.0)
HCT: 27.7 % — ABNORMAL LOW (ref 35.0–47.0)
Hemoglobin: 8.8 g/dL — ABNORMAL LOW (ref 12.0–16.0)
Hemoglobin: 8.7 g/dL — ABNORMAL LOW (ref 12.0–16.0)
LYMPHS ABS: 1 10*3/uL (ref 1.0–3.6)
LYMPHS PCT: 5 %
LYMPHS PCT: 6 %
Lymphs Abs: 0.8 10*3/uL — ABNORMAL LOW (ref 1.0–3.6)
MCH: 24.1 pg — ABNORMAL LOW (ref 26.0–34.0)
MCH: 24.1 pg — AB (ref 26.0–34.0)
MCHC: 31.1 g/dL — ABNORMAL LOW (ref 32.0–36.0)
MCHC: 31.5 g/dL — ABNORMAL LOW (ref 32.0–36.0)
MCV: 77.4 fL — AB (ref 80.0–100.0)
MCV: 76.5 fL — AB (ref 80.0–100.0)
MONOS PCT: 11 %
Monocytes Absolute: 1.9 10*3/uL — ABNORMAL HIGH (ref 0.2–0.9)
Monocytes Absolute: 1.9 10*3/uL — ABNORMAL HIGH (ref 0.2–0.9)
Monocytes Relative: 12 %
Neutro Abs: 13.8 10*3/uL — ABNORMAL HIGH (ref 1.4–6.5)
Neutro Abs: 14 10*3/uL — ABNORMAL HIGH (ref 1.4–6.5)
Neutrophils Relative %: 83 %
Neutrophils Relative %: 83 %
PLATELETS: 264 10*3/uL (ref 150–440)
Platelets: 281 10*3/uL (ref 150–440)
RBC: 3.67 MIL/uL — AB (ref 3.80–5.20)
RBC: 3.63 MIL/uL — ABNORMAL LOW (ref 3.80–5.20)
RDW: 19.1 % — ABNORMAL HIGH (ref 11.5–14.5)
RDW: 19.5 % — ABNORMAL HIGH (ref 11.5–14.5)
WBC: 16.6 10*3/uL — AB (ref 3.6–11.0)
WBC: 17.1 10*3/uL — ABNORMAL HIGH (ref 3.6–11.0)

## 2017-05-19 LAB — COMPREHENSIVE METABOLIC PANEL
ALBUMIN: 2.7 g/dL — AB (ref 3.5–5.0)
ALBUMIN: 2.9 g/dL — AB (ref 3.5–5.0)
ALK PHOS: 107 U/L (ref 38–126)
ALT: 16 U/L (ref 14–54)
ALT: 17 U/L (ref 14–54)
AST: 21 U/L (ref 15–41)
AST: 19 U/L (ref 15–41)
Alkaline Phosphatase: 96 U/L (ref 38–126)
Anion gap: 9 (ref 5–15)
Anion gap: 8 (ref 5–15)
BILIRUBIN TOTAL: 1 mg/dL (ref 0.3–1.2)
BUN: 28 mg/dL — AB (ref 6–20)
BUN: 27 mg/dL — AB (ref 6–20)
CALCIUM: 8.8 mg/dL — AB (ref 8.9–10.3)
CHLORIDE: 99 mmol/L — AB (ref 101–111)
CO2: 26 mmol/L (ref 22–32)
CO2: 26 mmol/L (ref 22–32)
CREATININE: 0.97 mg/dL (ref 0.44–1.00)
CREATININE: 1 mg/dL (ref 0.44–1.00)
Calcium: 8.4 mg/dL — ABNORMAL LOW (ref 8.9–10.3)
Chloride: 99 mmol/L — ABNORMAL LOW (ref 101–111)
GFR calc Af Amer: 60 mL/min — ABNORMAL LOW (ref >60)
GFR calc Af Amer: 57 mL/min — ABNORMAL LOW (ref >60)
GFR calc non Af Amer: 51 mL/min — ABNORMAL LOW (ref >60)
GFR calc non Af Amer: 50 mL/min — ABNORMAL LOW (ref >60)
GLUCOSE: 117 mg/dL — AB (ref 65–99)
GLUCOSE: 134 mg/dL — AB (ref 65–99)
Potassium: 3.1 mmol/L — ABNORMAL LOW (ref 3.5–5.1)
Potassium: 3.6 mmol/L (ref 3.5–5.1)
SODIUM: 134 mmol/L — AB (ref 135–145)
SODIUM: 133 mmol/L — AB (ref 135–145)
Total Bilirubin: 0.9 mg/dL (ref 0.3–1.2)
Total Protein: 6 g/dL — ABNORMAL LOW (ref 6.5–8.1)
Total Protein: 6.6 g/dL (ref 6.5–8.1)

## 2017-05-19 LAB — PROTIME-INR
INR: 1.17
Prothrombin Time: 14.8 seconds (ref 11.4–15.2)

## 2017-05-19 LAB — FUNGAL ORGANISM REFLEX

## 2017-05-19 LAB — FUNGUS CULTURE RESULT

## 2017-05-19 LAB — TROPONIN I: Troponin I: 0.05 ng/mL (ref <0.03)

## 2017-05-19 LAB — BRAIN NATRIURETIC PEPTIDE: B Natriuretic Peptide: 930 pg/mL — ABNORMAL HIGH (ref 0.0–100.0)

## 2017-05-19 MED ORDER — HEPARIN (PORCINE) IN NACL 100-0.45 UNIT/ML-% IJ SOLN
1200.0000 [IU]/h | INTRAMUSCULAR | Status: DC
  Administered 2017-05-20 – 2017-05-21 (×2): 1200 [IU]/h via INTRAVENOUS
  Filled 2017-05-19 (×3): qty 250

## 2017-05-19 MED ORDER — VANCOMYCIN HCL 10 G IV SOLR
1250.0000 mg | INTRAVENOUS | Status: DC
  Administered 2017-05-20 – 2017-05-22 (×3): 1250 mg via INTRAVENOUS
  Filled 2017-05-19 (×3): qty 1250

## 2017-05-19 MED ORDER — DEXTROSE 5 % IV SOLN
2.0000 g | Freq: Once | INTRAVENOUS | Status: AC
  Administered 2017-05-19: 2 g via INTRAVENOUS
  Filled 2017-05-19: qty 2

## 2017-05-19 MED ORDER — FUROSEMIDE 10 MG/ML IJ SOLN
20.0000 mg | Freq: Once | INTRAMUSCULAR | Status: AC
  Administered 2017-05-19: 20 mg via INTRAVENOUS
  Filled 2017-05-19: qty 4

## 2017-05-19 MED ORDER — HEPARIN BOLUS VIA INFUSION
5000.0000 [IU] | Freq: Once | INTRAVENOUS | Status: AC
  Administered 2017-05-20: 5000 [IU] via INTRAVENOUS
  Filled 2017-05-19: qty 5000

## 2017-05-19 MED ORDER — VANCOMYCIN HCL IN DEXTROSE 1-5 GM/200ML-% IV SOLN
1000.0000 mg | Freq: Once | INTRAVENOUS | Status: AC
  Administered 2017-05-19: 1000 mg via INTRAVENOUS
  Filled 2017-05-19: qty 200

## 2017-05-19 MED ORDER — DEXTROSE 5 % IV SOLN
2.0000 g | Freq: Two times a day (BID) | INTRAVENOUS | Status: DC
  Administered 2017-05-20 – 2017-05-24 (×9): 2 g via INTRAVENOUS
  Filled 2017-05-19 (×8): qty 2
  Filled 2017-05-19: qty 1
  Filled 2017-05-19 (×2): qty 2

## 2017-05-19 NOTE — ED Notes (Signed)
Pt assisted to restroom. Pt tolerated well. Pt O2 saturation remaining WNL.

## 2017-05-19 NOTE — ED Notes (Addendum)
Dr. Clearnce Hasten at pt's bedside speaking with family and pt about plan. Pt in NAD. Pt given pillow at this time.

## 2017-05-19 NOTE — ED Notes (Signed)
Troponin 0.05, provider will be made aware.

## 2017-05-19 NOTE — ED Triage Notes (Signed)
Pt brought in by EMS after family noticing an increase of swelling in BLE and left hand. Pt is also c/o SOB with walking. Pt stating that she has also been feeling "extra tired." Pt is dosing off while nurse is in room. Pt is able to answer questions appropriately at this time. VS are stable. Doctor has seen pt.

## 2017-05-19 NOTE — ED Notes (Addendum)
Patient transported to X-ray and US 

## 2017-05-19 NOTE — Progress Notes (Signed)
Location:   [facility] Room Number: [phone] Place of Service:  SNF [phone]) Provider:  Toni Arthurs, NP-C  Einar Pheasant, MD  Patient Care Team: Einar Pheasant, MD as PCP - General (Internal Medicine)  Extended Emergency Contact Information Primary Emergency Contact: Osborne Casco of [place] Phone: [phone] Mobile Phone: [phone] Relation: Daughter Secondary Emergency Contact: Ilona Sorrel [place] of [place] Phone: [phone] Relation: Son  Code Status:  FULL Goals of care: Advanced Directive information Advanced Directives 05/19/2017  Does Patient Have a Medical Advance Directive? No  Type of Advance Directive -  Does patient want to make changes to medical advance directive? -  Copy of [place] in Chart? -  Would patient like information on creating a medical advance directive? -     Chief Complaint  Patient presents with  . Acute Visit    Follow up on left arm edema    HPI:  Pt is a 83 y.o. female seen today for an acute visit for sudden onset of malaise and fatigue along with left arm edema.  Patient was seen yesterday and was alert, oriented, feeling well and without complaints.  Today, staff reported concern of patient generally "not feeling well".  No specific complaint aside from fatigue.  Patient denies n/v/d/f/c/cp/sob/ha/abd pain/dizziness/cough/congestion.  Left upper extremity with generalized, nonpitting edema extending from the fingers to axilla.  Fingers are warm to touch.  Weak, but palpable radial pulse.  Staff has been assisting patient to keep it elevated on pillows throughout the day.  Will order labs and radiological evaluations as well as full dose DVT prophylaxis until results are available.  Spoke with Dr. Ouida Sills regarding the change in condition.  He is in agreement with plan.  Vital signs stable.   Past Medical History:  Diagnosis Date  . Anemia   .  Anxiety 10/02/2012  . Atrial fibrillation ([place]) 11/13   new onset 11/13  . BCC (basal cell carcinoma), face 04/24/2016  . Benign essential hypertension 03/13/2015  . Cellulitis of right upper extremity 01/27/2017  . Constipation 01/13/2015  . Decreased hearing 05/21/2015  . Health care maintenance 10/14/2014   Mammogram 10/01/15 - [facility] I.    . Hypercholesterolemia   . Hyperlipidemia, mixed 02/01/2017  . Hypertension   . Osteoarthritis    hands, feet  . Sick sinus syndrome ([place]) 01/04/2017   Overview:  Sp pacer placement 2018  . Syncope 12/08/2016   Past Surgical History:  Procedure Laterality Date  . APPENDECTOMY  1957  . CATARACT EXTRACTION  2013   left eye  . COLONOSCOPY  09/06/2008  . INCISION AND DRAINAGE ABSCESS Right 01/29/2017   Procedure: INCISION AND DRAINAGE ABSCESS;  Surgeon: Florene Glen, MD;  Location: [facility] ORS;  Service: General;  Laterality: Right;  . KIDNEY SURGERY  age 30  . PACEMAKER INSERTION Left 12/14/2016   Procedure: INSERTION PACEMAKER;  Surgeon: Isaias Cowman, MD;  Location: [facility] ORS;  Service: Cardiovascular;  Laterality: Left;  . TUBAL LIGATION    . VIDEO ASSISTED THORACOSCOPY (VATS)/THOROCOTOMY Right 04/19/2017   Procedure: RIGHT THORACOSCOPY AND EVACUATION OF HEMOTHORAX;  Surgeon: Nestor Lewandowsky, MD;  Location: [facility] ORS;  Service: General;  Laterality: Right;    No Known Allergies  Allergies as of 05/19/2017   No Known Allergies     Medication List       Accurate as of 05/19/17  2:17 PM. Always use your most recent med list.  acetaminophen 325 MG tablet Commonly known as:  TYLENOL Take 2 tablets (650 mg total) by mouth every 6 (six) hours as needed for mild pain, moderate pain or fever (or Fever >/= 101).   albuterol (2.5 MG/3ML) 0.083% nebulizer solution Commonly known as:  PROVENTIL Take 3 mLs (2.5 mg total) by nebulization every 2 (two) hours as needed for wheezing.   amiodarone 200 MG tablet Commonly known as:   PACERONE Take 1 tablet (200 mg total) by mouth 2 (two) times daily.   bisacodyl 5 MG EC tablet Commonly known as:  DULCOLAX Take 1 tablet (5 mg total) by mouth daily as needed for moderate constipation.   cephALEXin 500 MG capsule Commonly known as:  KEFLEX Take 500 mg by mouth 3 (three) times daily.   ENSURE ENLIVE PO Take 1 Bottle by mouth 2 (two) times daily between meals.   feeding supplement (PRO-STAT SUGAR FREE 64) Liqd Take 30 mLs by mouth 2 (two) times daily between meals.   FLUoxetine 40 MG capsule Commonly known as:  PROZAC Take 1 capsule (40 mg total) by mouth daily.   HYDROcodone-acetaminophen 5-325 MG tablet Commonly known as:  NORCO/VICODIN Take 1-2 tablets by mouth every 6 (six) hours as needed for moderate pain.   lovastatin 20 MG tablet Commonly known as:  MEVACOR Take 1 tablet (20 mg total) by mouth daily.   metoprolol succinate 50 MG 24 hr tablet Commonly known as:  TOPROL-XL TAKE (1) TABLET BY MOUTH DAILY. TAKE WITH OR IMMEDIATELY FOLLOWING A MEAL   senna-docusate 8.6-50 MG tablet Commonly known as:  Senokot-S Take 1 tablet by mouth at bedtime as needed for mild constipation.   torsemide 5 MG tablet Commonly known as:  DEMADEX Take 5 mg by mouth daily.   traZODone 50 MG tablet Commonly known as:  DESYREL Take 1 tablet (50 mg total) by mouth at bedtime as needed for sleep.   Vitamin D-3 1000 units Caps Take 1 capsule by mouth daily.       Review of Systems  Constitutional: Positive for activity change and fatigue. Negative for appetite change, chills, diaphoresis and fever.  HENT: Negative for congestion, sneezing, sore throat, trouble swallowing and voice change.   Respiratory: Positive for cough (improved). Negative for apnea, choking, chest tightness, shortness of breath and wheezing.   Cardiovascular: Negative for chest pain, palpitations and leg swelling.  Gastrointestinal: Negative for abdominal distention, abdominal pain, constipation,  diarrhea and nausea.  Genitourinary: Negative for difficulty urinating, dysuria, frequency and urgency.  Musculoskeletal: Positive for arthralgias (typical arthritis). Negative for back pain, gait problem and myalgias.  Skin: Negative for color change, pallor, rash and wound.  Neurological: Positive for weakness. Negative for dizziness, tremors, syncope, speech difficulty, numbness and headaches.  Psychiatric/Behavioral: Negative for agitation.  All other systems reviewed and are negative.   Immunization History  Administered Date(s) Administered  . Influenza Split 05/23/2012  . Influenza, High Dose Seasonal PF 05/08/2016  . Influenza,inj,Quad PF,6+ Mos 05/05/2013, 04/06/2014, 05/21/2015  . Influenza-Unspecified 05/21/2012, 04/29/2017  . PPD Test 05/11/2017  . Pneumococcal Conjugate-13 08/11/2011   Pertinent  Health Maintenance Due  Topic Date Due  . DEXA SCAN  06/25/1995  . PNA vac Low Risk Adult (2 of 2 - PPSV23) 08/10/2012  . MAMMOGRAM  09/30/2016  . INFLUENZA VACCINE  Addressed   Fall Risk  10/28/2016 05/08/2016 05/21/2015 01/11/2015 12/01/2013  Falls in the past year? _0    Functional Status Survey:    Vitals:   05/19/17  1415  BP: 132/68  Pulse: 99  Resp: 20  Temp: 98.8 F (37.1 C)  SpO2: 98%  Weight: 182 lb 4.8 oz (82.7 kg)  Height: _0  (1.575 m)   Body mass index is 33.34 kg/m. Physical Exam  Constitutional: She is oriented to person, place, and time. Vital signs are normal. She appears well-developed and well-nourished. She appears lethargic. She is active and cooperative. She does not appear ill. No distress.  HENT:  Head: Normocephalic and atraumatic.  Mouth/Throat: Uvula is midline, oropharynx is clear and moist and mucous membranes are normal. Mucous membranes are not pale, not dry and not cyanotic.  Eyes: Pupils are equal, round, and reactive to light. Conjunctivae, EOM and lids are normal.  Neck: Trachea normal, normal range of motion and full  passive range of motion without pain. Neck supple. No JVD present. No tracheal deviation, no edema and no erythema present. No thyromegaly present.  Cardiovascular: Normal rate, regular rhythm, normal heart sounds and intact distal pulses.  Exam reveals no gallop, no distant heart sounds and no friction rub.   No murmur heard. Pulses:      Radial pulses are 2+ on the right side, and 1+ on the left side.       Dorsalis pedis pulses are 2+ on the right side, and 2+ on the left side.  LUE generalized edema  Pulmonary/Chest: Effort normal. No accessory muscle usage. No respiratory distress. She has no decreased breath sounds. She has no wheezes. She has no rhonchi. She has no rales. She exhibits no tenderness.  Abdominal: Normal appearance and bowel sounds are normal. She exhibits no distension and no ascites. There is no tenderness.  Musculoskeletal: Normal range of motion. She exhibits no edema or tenderness.  Expected osteoarthritis, stiffness  Neurological: She is oriented to person, place, and time. She has normal strength. She appears lethargic.  Skin: Skin is warm, dry and intact. No rash noted. She is not diaphoretic. No cyanosis or erythema. No pallor. Nails show no clubbing.  Psychiatric: She has a normal mood and affect. Her speech is normal and behavior is normal. Judgment and thought content normal. Cognition and memory are normal.  Nursing note and vitals reviewed.   Labs reviewed:  Recent Labs  12/08/16 2145  04/19/17 0451  04/23/17 0613 04/27/17 0730 05/11/17 0645  NA  --   < > 139  < > 139 138 133*  K  --   < > 4.0  < > 4.1 4.0 3.2*  CL  --   < > 108  < > 106 106 102  CO2  --   < > 23  < > _1 GLUCOSE  --   < > 120*  < > 91 96 91  BUN  --   < > 37*  < > 17 18 22*  CREATININE  --   < > 1.49*  < > 0.75 0.85 0.98  CALCIUM  --   < > 8.6*  < > 8.4* 8.6* 8.6*  MG 2.0  --  1.9  --   --   --   --   PHOS  --   --  3.6  --   --   --   --   < > = values in this interval  not displayed.  Recent Labs  03/23/17 1212 04/18/17 0526 05/11/17 0645  AST 17 36 29  ALT _2 ALKPHOS 70 63 88  BILITOT 0.4 0.7 0.7  PROT 6.4 6.7 6.0*  ALBUMIN 4.2 3.7 3.2*    Recent Labs  04/30/17 0400 05/01/17 0607 05/11/17 0645  WBC 7.3 6.9 7.6  NEUTROABS 4.8 4.9 5.3  HGB 7.7* 8.4* 8.5*  HCT 23.5* 25.3* 26.6*  MCV 83.2 84.2 80.4  PLT 268 284 265   Lab Results  Component Value Date   TSH 2.822 04/30/2017   No results found for: HGBA1C Lab Results  Component Value Date   CHOL 219 (H) 11/26/2016   HDL 91.90 11/26/2016   LDLCALC 113 (H) 11/26/2016   LDLDIRECT 105.8 09/06/2013   TRIG 68.0 11/26/2016   CHOLHDL 2 11/26/2016    Significant Diagnostic Results in last 30 days:  Dg Chest 2 View  Result Date: 05/13/2017 CLINICAL DATA:  Follow-up pneumonia EXAM: CHEST  2 VIEW COMPARISON:  05/06/2017, 04/28/2017, 04/18/2017, 12/14/2016 FINDINGS: Hyperinflation. Persistent small pleural effusions. No change in right lower lung pleural and parenchymal opacity. No change in right middle lobe opacity. Stable cardiomediastinal silhouette, slightly enlarged with atherosclerosis no pneumothorax. Left-sided pacing device as before IMPRESSION: 1. Continued right pleural and parenchymal disease in the right middle lobe and right lower lobe which may reflect combination of loculated pleural effusion and continued pneumonia. No significant change from prior. Continued radiographic follow-up recommended 2. Small left pleural effusion with left basilar atelectasis 3. Mild cardiomegaly Electronically Signed   By: Donavan Foil M.D.   On: 05/13/2017 16:16   Dg Chest 2 View  Result Date: 05/06/2017 CLINICAL DATA:  Cough. EXAM: CHEST  2 VIEW COMPARISON:  Radiographs of April 28, 2017. FINDINGS: Stable cardiomegaly. Atherosclerosis of thoracic aorta is noted. No pneumothorax is noted. Increased right middle lobe opacity is noted concerning for pneumonia or atelectasis with mild associated  pleural effusion. Right-sided chest tube has been removed. Left-sided pacemaker is unchanged in position. Minimal left pleural effusion is noted. Bony thorax is unremarkable. IMPRESSION: Aortic atherosclerosis. Stable cardiomegaly. Increased right middle lobe pneumonia or atelectasis is noted with mild associated pleural effusion. Electronically Signed   By: Marijo Conception, M.D.   On: 05/06/2017 17:13   Dg Chest 2 View  Result Date: 04/27/2017 CLINICAL DATA:  Post right thoracotomy for evacuation of hemothorax 1 week ago EXAM: CHEST  2 VIEW COMPARISON:  Chest x-ray of 04/26/2017 FINDINGS: This still appears to be a tiny right apical pneumothorax present with right chest tube noted. Small left pleural effusion remains. Cardiomegaly is stable. Permanent pacemaker is unchanged. IMPRESSION: 1. Little change in tiny right apical pneumothorax. Right chest tube remains. 2. Small left pleural effusion is unchanged. Stable cardiomegaly with pacer. Electronically Signed   By: Ivar Drape M.D.   On: 04/27/2017 08:13   Dg Chest 2 View  Result Date: 04/23/2017 CLINICAL DATA:  Postoperative evaluation.  Thoracoscopy 4 days ago. EXAM: CHEST  2 VIEW COMPARISON:  Yesterday FINDINGS: Small right apical pneumothorax, less than 5%, unchanged. Right-sided chest tubes in stable position, 1 tip at the apex, the other at the base. There is artifact from EKG leads. Dual-chamber pacer from the left. Small left pleural effusion and retrocardiac lung opacity. Stable cardiomegaly. IMPRESSION: Stable small right apical pneumothorax, less than 5%. Chest tubes are in stable position. Electronically Signed   By: Monte Fantasia M.D.   On: 04/23/2017 08:38   Dg Chest 2 View  Result Date: 04/22/2017 CLINICAL DATA:  Status post thoracoscopy and evacuation of a hemothorax on the right 3 days ago. EXAM: CHEST  2 VIEW COMPARISON:  Portable chest x-ray of April 20, 2017 FINDINGS: The lungs are adequately inflated. A tiny right apical  pneumothorax amounting to less than 5% of the lung volume is noted. The 2 right-sided chest tubes are in stable position. There is no right pleural effusion. On the left there is basilar atelectasis and small effusion. The heart is enlarged. The pulmonary vascularity is mildly prominent centrally. The ICD is in stable position. There is calcification in the wall of the aortic arch. IMPRESSION: 5% or less right apical pneumothorax. Stable positioning of the chest tubes with the uppermost tube tip projecting over the medial aspect of the right fourth posterior rib. Left lower lobe atelectasis or pneumonia increased since the study of 2 days ago periods probable trace left pleural effusion. Thoracic aortic atherosclerosis. Electronically Signed   By: David  Martinique M.D.   On: 04/22/2017 07:24   Dg Chest Port 1 View  Result Date: 04/28/2017 CLINICAL DATA:  Postop, chest tube clamped EXAM: PORTABLE CHEST 1 VIEW COMPARISON:  04/28/2017 at 0739 hours FINDINGS: Tiny right apical pneumothorax, unchanged.  Stable right chest tube. Small left pleural effusion.  No frank interstitial edema. The heart is normal in size.  Left subclavian pacemaker. IMPRESSION: Tiny right apical pneumothorax, unchanged.  Stable right chest tube. Electronically Signed   By: Julian Hy M.D.   On: 04/28/2017 14:09   Dg Chest Port 1 View  Result Date: 04/28/2017 CLINICAL DATA:  Right pneumothorax. EXAM: PORTABLE CHEST 1 VIEW COMPARISON:  04/27/2017 FINDINGS: Single right-sided chest tube remains in place. Tiny residual right apical pneumothorax is essentially unchanged. There is increased accentuation of the interstitial markings at the lung bases suggesting mild interstitial edema. Chronic cardiomegaly. Pacemaker in place. Small persistent left effusion. No appreciable right effusion. Blunting of the right costophrenic angle laterally. IMPRESSION: No change in tiny right apical pneumothorax. New interstitial accentuation suggesting mild  pulmonary edema. Small left effusion, stable. Electronically Signed   By: Lorriane Shire M.D.   On: 04/28/2017 08:29   Dg Chest Port 1 View  Result Date: 04/26/2017 CLINICAL DATA:  Status post right thoracoscopy. EXAM: PORTABLE CHEST 1 VIEW COMPARISON:  Radiograph of April 25, 2017. FINDINGS: Stable cardiomegaly. Atherosclerosis of thoracic aorta is noted. Left-sided pacemaker is unchanged in position. Stable mild right apical pneumothorax is noted. Right-sided chest tube is unchanged in position. Stable bibasilar subsegmental atelectasis with associated pleural effusions is noted. Bony thorax is unremarkable. Degenerative changes seen involving the left glenohumeral joint. IMPRESSION: Aortic atherosclerosis. Stable bibasilar subsegmental atelectasis with associated pleural effusions. Stable mild right apical pneumothorax. Right-sided chest tube is unchanged in position. Electronically Signed   By: Marijo Conception, M.D.   On: 04/26/2017 07:46   Dg Chest Port 1 View  Result Date: 04/25/2017 CLINICAL DATA:  Right-sided chest tube.  Follow-up of pneumothorax. EXAM: PORTABLE CHEST 1 VIEW COMPARISON:  04/24/2017 FINDINGS: Dual lead pacer. Midline trachea. Mild cardiomegaly with transverse aortic atherosclerosis. Right hemidiaphragm elevation. Trace bilateral pleural effusions are similar. Approximately 5-10% right apical pneumothorax identified. Slightly decreased today, with visceral pleural line 10 mm from chest wall versus 13 mm yesterday. right chest tube remains in place. Resolution of congestive heart failure. Bibasilar airspace disease remains. IMPRESSION: Slight decrease in 5-10% right apical pneumothorax. Cardiomegaly with persistent bibasilar atelectasis and probable small bilateral pleural effusions. Electronically Signed   By: Abigail Miyamoto M.D.   On: 04/25/2017 08:58   Dg Chest Port 1 View  Result Date: 04/24/2017 CLINICAL DATA:  Pneumothorax. EXAM: PORTABLE CHEST 1 VIEW COMPARISON:  Chest  x-ray from yesterday. FINDINGS: Interval removal of the right apical chest tube. The right basilar chest tube is unchanged in position. The right apical pneumothorax is slightly increased in size, now approximately 10%. Stable cardiomegaly. Unchanged left chest wall AICD. Mild pulmonary vascular congestion. Unchanged left basilar opacity and small bilateral pleural effusions. IMPRESSION: 1. Interval removal of the right apical chest tube. Small right apical pneumothorax is slightly increased in size, now approximately 10%. 2. Stable cardiomegaly, mild pulmonary vascular congestion, and small bilateral pleural effusions. Unchanged left basilar atelectasis versus infiltrate. These results will be called to the ordering clinician or representative by the Radiologist Assistant, and communication documented in the PACS or zVision Dashboard. Electronically Signed   By: Titus Dubin M.D.   On: 04/24/2017 08:39   Dg Chest Port 1 View  Result Date: 04/20/2017 CLINICAL DATA:  Postop VATS. EXAM: PORTABLE CHEST 1 VIEW COMPARISON:  04/19/2017 FINDINGS: Right chest tubes remain in place. The more a PICC Lee located chest tube on the prior study appears slightly more inferiorly and medially located on the current examination. The cardiomediastinal silhouette is unchanged. Aortic atherosclerosis and a pacemaker are again noted. There is slightly increased opacity in the right lung base, likely atelectasis. There is likely a trace right pleural effusion. No pneumothorax is identified. There is at most minimal atelectasis in the left lung base. IMPRESSION: 1. Right chest tubes as above.  No pneumothorax. 2. Minimal right basilar atelectasis and likely trace pleural effusion. Electronically Signed   By: Logan Bores M.D.   On: 04/20/2017 09:58   Dg Chest Port 1 View  Result Date: 04/19/2017 CLINICAL DATA:  Respiratory failure, postop EXAM: PORTABLE CHEST 1 VIEW COMPARISON:  04/19/2017 FINDINGS: Right chest tubes are in  place. No pneumothorax. Re- aeration of the right lung. Heart is borderline in size. Pacer is unchanged. No confluent opacity currently. IMPRESSION: Placement of right chest tubes without pneumothorax. Re-expansion and clearance of the right lung. Electronically Signed   By: Rolm Baptise M.D.   On: 04/19/2017 17:27   Dg Hip Unilat With Pelvis 2-3 Views Left  Result Date: 04/20/2017 CLINICAL DATA:  Left hip anterior hip pain. EXAM: DG HIP (WITH OR WITHOUT PELVIS) 2-3V LEFT COMPARISON:  None. FINDINGS: An AP view the pelvis with AP and frogleg views of the left hip are provided. There is lower lumbar degenerative disc disease at L4-5 with associated facet arthropathy. The bony pelvis appears intact with mild osteoarthritic sclerosis of both SI joints along their synovial portion. Moderate size stool ball is seen in the mid pelvis. There is axial joint space narrowing of both hips mild-to-moderate in degree. No acute fracture or suspicious osseous lesions. Vascular calcifications are identified along the course of the left femoral artery. Injection granulomata are noted about the left buttock. IMPRESSION: 1. Mild axial degenerative joint space narrowing of both hips. 2. No acute osseous abnormality of the pelvis and hips. 3. Osteoarthritis of the SI joints. 4. Lower lumbar degenerative disc disease. Electronically Signed   By: Ashley Royalty M.D.   On: 04/20/2017 14:31    Assessment/Plan 1.  Malaise and fatigue 2.  Edema of upper extremity  2 view chest x-ray  Left upper extremity duplex Doppler  2D echo  Labs  Lovenox 60 mg subcu x1 now  Family later opted to have patient sent to the ED for evaluation  Family/ staff Communication:   Total Time:  Documentation:  Face to Face:  Family/Phone:   Labs/tests ordered: 2 view chest  x-ray, left upper extremity duplex Doppler, 2D echo, CBC, met C  Medication list reviewed and assessed for continued appropriateness.  Vikki Ports,  NP-C Geriatrics [facility] [phone], [place] [address] Cell Phone (Mon-Fri 8am-5pm):  [phone] On Call:  [phone] & follow prompts after 5pm & weekends Office Phone:  [phone] Office Fax:  [phone]

## 2017-05-19 NOTE — Progress Notes (Signed)
ANTICOAGULATION CONSULT NOTE - Initial Consult  Pharmacy Consult for heparin Indication: upper extremity subclavian DVT  No Known Allergies  Patient Measurements: Weight: 178 lb (80.7 kg) Heparin Dosing Weight: 80.7 kg  Vital Signs: Temp: 98.6 F (37 C) (10/10 1938) Temp Source: Oral (10/10 1938) BP: 116/90 (10/10 2130) Pulse Rate: 65 (10/10 2130)  Labs:  Recent Labs  05/19/17 1440 05/19/17 1933  HGB 8.8* 8.7*  HCT 28.4* 27.7*  PLT 264 281  LABPROT  --  14.8  INR  --  1.17  CREATININE 0.97 1.00  TROPONINI  --  0.05*    Estimated Creatinine Clearance: 39.7 mL/min (by C-G formula based on SCr of 1 mg/dL).   Medical History: Past Medical History:  Diagnosis Date  . Anemia   . Anxiety 10/02/2012  . Atrial fibrillation ([place]) 11/13   new onset 11/13  . BCC (basal cell carcinoma), face 04/24/2016  . Benign essential hypertension 03/13/2015  . Cellulitis of right upper extremity 01/27/2017  . Constipation 01/13/2015  . Decreased hearing 05/21/2015  . Health care maintenance 10/14/2014   Mammogram 10/01/15 - Birads I.    . Hypercholesterolemia   . Hyperlipidemia, mixed 02/01/2017  . Hypertension   . Osteoarthritis    hands, feet  . Sick sinus syndrome ([place]) 01/04/2017   Overview:  Sp pacer placement 2018  . Syncope 12/08/2016    Medications:  Scheduled:  . [START ON 05/20/2017] heparin  5,000 Units Intravenous Once    Assessment: Patient is an 83 y/o female admitted for leg swelling and upper extremity edema was found to have: 1. Extensive occlusive DVT extending from the left subclavian through one of the paired left brachial veins. 2. Extensive occlusive superficial thrombophlebitis involving the left cephalic and basilic veins. Patient is being started on heparin for lower and upper extremity DVT.  Goal of Therapy:  Heparin level 0.3-0.7 units/ml Monitor platelets by anticoagulation protocol: Yes   Plan:  Give 5000 units bolus x 1  Will start rate at 1200  units/hr and will check HL @ 0800. Baseline labs ordered Will adjust rate per HLs and monitor CBCs daily while on heparin.  Tobie Lords, PharmD, BCPS Clinical Pharmacist 05/20/2017

## 2017-05-19 NOTE — ED Notes (Signed)
Pt helped to the restroom. Pt tolerated well.

## 2017-05-19 NOTE — ED Notes (Addendum)
Pt returned from xray and [place]. Pt in NAD. Pt's family is at bedside at this time.

## 2017-05-19 NOTE — ED Notes (Signed)
Nurse in with pt's family. Nurse informed pt's family that pt would be back to room momentarily due to the fact she was in [place] at this time.

## 2017-05-19 NOTE — ED Notes (Signed)
Nurse assisted pt to restroom. Pt tolerated well.

## 2017-05-19 NOTE — ED Provider Notes (Signed)
[facility] Emergency Department Provider Note  ____________________________________________   First MD Initiated Contact with Patient 05/19/17 2017     (approximate)  I have reviewed the triage vital signs and the nursing notes.   HISTORY  Chief Complaint Leg Swelling   HPI Kristin Avila is a 83 y.o. female with a history of cellulitis, atrial fibrillation sig B who is presenting to the emergency department today with bilateral lower extremity swelling as well as left upper extremity edema with a past several days. The patient is also reporting shortness of breath however, at rest now she denies any shortness of breath. Denying any pain.   Past Medical History:  Diagnosis Date  . Anemia   . Anxiety 10/02/2012  . Atrial fibrillation ([place]) 11/13   new onset 11/13  . BCC (basal cell carcinoma), face 04/24/2016  . Benign essential hypertension 03/13/2015  . Cellulitis of right upper extremity 01/27/2017  . Constipation 01/13/2015  . Decreased hearing 05/21/2015  . Health care maintenance 10/14/2014   Mammogram 10/01/15 - Birads I.    . Hypercholesterolemia   . Hyperlipidemia, mixed 02/01/2017  . Hypertension   . Osteoarthritis    hands, feet  . Sick sinus syndrome ([place]) 01/04/2017   Overview:  Sp pacer placement 2018  . Syncope 12/08/2016    Patient Active Problem List   Diagnosis Date Noted  . Encephalopathy acute 04/29/2017  . Pleural effusion on right 04/18/2017  . Hypotension 04/18/2017  . Hyperlipidemia, mixed 02/01/2017  . Cellulitis of right upper extremity 01/27/2017  . Sick sinus syndrome ([place]) 01/04/2017  . Syncope 12/08/2016  . BCC (basal cell carcinoma), face 04/24/2016  . Decreased hearing 05/21/2015  . Benign essential hypertension 03/13/2015  . Constipation 01/13/2015  . Health care maintenance 10/14/2014  . Anxiety 10/02/2012  . Atrial fibrillation ([place]) 07/09/2012  . Osteoarthritis 07/08/2012  . Hypercholesterolemia 07/08/2012    . Hypertension 07/08/2012    Past Surgical History:  Procedure Laterality Date  . APPENDECTOMY  1957  . CATARACT EXTRACTION  2013   left eye  . COLONOSCOPY  09/06/2008  . INCISION AND DRAINAGE ABSCESS Right 01/29/2017   Procedure: INCISION AND DRAINAGE ABSCESS;  Surgeon: Florene Glen, MD;  Location: [facility] ORS;  Service: General;  Laterality: Right;  . KIDNEY SURGERY  age 24  . PACEMAKER INSERTION Left 12/14/2016   Procedure: INSERTION PACEMAKER;  Surgeon: Isaias Cowman, MD;  Location: [facility] ORS;  Service: Cardiovascular;  Laterality: Left;  . TUBAL LIGATION    . VIDEO ASSISTED THORACOSCOPY (VATS)/THOROCOTOMY Right 04/19/2017   Procedure: RIGHT THORACOSCOPY AND EVACUATION OF HEMOTHORAX;  Surgeon: Nestor Lewandowsky, MD;  Location: [facility] ORS;  Service: General;  Laterality: Right;    Prior to Admission medications   Medication Sig Start Date End Date Taking? Authorizing Provider  acetaminophen (TYLENOL) 325 MG tablet Take 2 tablets (650 mg total) by mouth every 6 (six) hours as needed for mild pain, moderate pain or fever (or Fever >/= 101). 12/11/16  Yes Gouru, Illene Silver, MD  albuterol (PROVENTIL) (2.5 MG/3ML) 0.083% nebulizer solution Take 3 mLs (2.5 mg total) by nebulization every 2 (two) hours as needed for wheezing. 04/28/17  Yes Gouru, Illene Silver, MD  Amino Acids-Protein Hydrolys (FEEDING SUPPLEMENT, PRO-STAT SUGAR FREE 64,) LIQD Take 30 mLs by mouth 2 (two) times daily between meals.   Yes [provider]  amiodarone (PACERONE) 200 MG tablet Take 1 tablet (200 mg total) by mouth 2 (two) times daily. 04/28/17  Yes Nicholes Mango, MD  bisacodyl (DULCOLAX) 5 MG EC tablet Take 1 tablet (5 mg total) by mouth daily as needed for moderate constipation. 04/28/17  Yes Gouru, Illene Silver, MD  cephALEXin (KEFLEX) 500 MG capsule Take 500 mg by mouth 3 (three) times daily.   Yes [provider]  Cholecalciferol (VITAMIN D-3) 1000 UNITS CAPS Take 1 capsule by mouth daily.    Yes [provider]  FLUoxetine (PROZAC) 40 MG capsule Take 1 capsule (40 mg total) by mouth daily. 01/18/17  Yes Einar Pheasant, MD  HYDROcodone-acetaminophen (NORCO/VICODIN) 5-325 MG tablet Take 1-2 tablets by mouth every 6 (six) hours as needed for moderate pain. 04/29/17  Yes Toni Arthurs, NP  lovastatin (MEVACOR) 20 MG tablet Take 1 tablet (20 mg total) by mouth daily. 01/18/17  Yes Einar Pheasant, MD  metoprolol succinate (TOPROL-XL) 50 MG 24 hr tablet TAKE (1) TABLET BY MOUTH DAILY. TAKE WITH OR IMMEDIATELY FOLLOWING A MEAL 01/19/17  Yes Einar Pheasant, MD  Nutritional Supplements (ENSURE ENLIVE PO) Take 1 Bottle by mouth 2 (two) times daily between meals.   Yes [provider]  senna-docusate (SENOKOT-S) 8.6-50 MG tablet Take 1 tablet by mouth at bedtime as needed for mild constipation. 12/11/16  Yes Gouru, Illene Silver, MD  torsemide (DEMADEX) 5 MG tablet Take 5 mg by mouth daily.   Yes [provider]  traZODone (DESYREL) 50 MG tablet Take 1 tablet (50 mg total) by mouth at bedtime as needed for sleep. 04/28/17  Yes Nicholes Mango, MD    Allergies Patient has no known allergies.  Family History  Problem Relation Age of Onset  . Congestive Heart Failure Father   . Multiple myeloma Mother   . Hypertension Brother   . Alzheimer's disease Unknown        grandmother and great aunts  . Breast cancer Neg Hx   . Colon cancer Neg Hx     Social History Social History  Substance Use Topics  . Smoking status: Never Smoker  . Smokeless tobacco: Never Used  . Alcohol use 0.0 oz/week     Comment: occasional    Review of Systems  Constitutional: No fever/chills Eyes: No visual changes. ENT: No sore throat. Cardiovascular: Denies chest pain. Respiratory: as above Gastrointestinal: No abdominal pain.  No nausea, no vomiting.  No diarrhea.  No constipation. Genitourinary: Negative for dysuria. Musculoskeletal: Negative for back pain. Skin: Negative for rash. Neurological: Negative for  headaches, focal weakness or numbness.   ____________________________________________   PHYSICAL EXAM:  VITAL SIGNS: ED Triage Vitals  Enc Vitals Group     BP 05/19/17 1938 121/84     Pulse Rate 05/19/17 1938 69     Resp 05/19/17 1938 20     Temp 05/19/17 1938 98.6 F (37 C)     Temp Source 05/19/17 1938 Oral     SpO2 05/19/17 1938 98 %     Weight 05/19/17 1939 178 lb (80.7 kg)     Height --      Head Circumference --      Peak Flow --      Pain Score 05/19/17 1937 0     Pain Loc --      Pain Edu? --      Excl. in [place]? --     Constitutional: Alert and oriented. Well appearing and in no acute distress. Eyes: Conjunctivae are normal.  Head: Atraumatic. Nose: No congestion/rhinnorhea. Mouth/Throat: Mucous membranes are moist.  Neck: No stridor.   Cardiovascular: Normal rate, regular rhythm. Grossly normal heart  sounds.   Respiratory: Normal respiratory effort.  No retractions. rales to the bilateral bases. Gastrointestinal: Soft and nontender. No distention. No CVA tenderness. Musculoskeletal:  moderate bilateral lower extremity edema without warmth, tenderness or pus.Left upper extremity with moderate edema to the hand distal forearm. Patient with 5 out of 5 strength. Sensation is intact to light touch. No erythema, induration. Intact radial pulse to the left upper extremity.  Neurologic:  Normal speech and language. No gross focal neurologic deficits are appreciated. Skin:  Skin is warm, dry and intact. No rash noted. Psychiatric: Mood and affect are normal. Speech and behavior are normal.  ____________________________________________   LABS (all labs ordered are listed, but only abnormal results are displayed)  Labs Reviewed  CBC WITH DIFFERENTIAL/PLATELET - Abnormal; Notable for the following:       Result Value   WBC 17.1 (*)    RBC 3.63 (*)    Hemoglobin 8.7 (*)    HCT 27.7 (*)    MCV 76.5 (*)    MCH 24.1 (*)    MCHC 31.5 (*)    RDW 19.5 (*)    Neutro Abs  14.0 (*)    Monocytes Absolute 1.9 (*)    All other components within normal limits  COMPREHENSIVE METABOLIC PANEL - Abnormal; Notable for the following:    Sodium 133 (*)    Chloride 99 (*)    Glucose, Bld 134 (*)    BUN 27 (*)    Calcium 8.8 (*)    Albumin 2.9 (*)    GFR calc non Af Amer 50 (*)    GFR calc Af Amer 57 (*)    All other components within normal limits  TROPONIN I - Abnormal; Notable for the following:    Troponin I 0.05 (*)    All other components within normal limits  BRAIN NATRIURETIC PEPTIDE - Abnormal; Notable for the following:    B Natriuretic Peptide 930.0 (*)    All other components within normal limits   ____________________________________________  EKG  ED ECG REPORT I, Doran Stabler, the attending physician, personally viewed and interpreted this ECG.   Date: 05/19/2017  EKG Time: 1934  Rate: 69  Rhythm: atrial-ventricular dual paced rhythm  Axis: normal  Intervals:wide complex consistent with ventricular pacing.  ST&T Change: T-wave inversions in V5 and V6 as well as one aVL. No abnormal ST elevation or depression. This elevations consistent with ventricular pacing.no significant change from previous.V5 and V6 T waves inverted on EKG from 12/23/2016.  ____________________________________________  RADIOLOGY  chest x-ray with continued right lower lobe airspace disease, likely pneumonia with a right effusion which is slightly improved.  Extensive DVT in the left upper extremity with extensive occlusive superficial thrombophlebitis. ____________________________________________   PROCEDURES  Procedure(s) performed:   Procedures  Critical Care performed:   ____________________________________________   INITIAL IMPRESSION / ASSESSMENT AND PLAN / ED COURSE  Pertinent labs & imaging results that were available during my care of the patient were reviewed by me and considered in my medical decision making (see chart for  details).  Differential includes, but is not limited to, viral syndrome, bronchitis including COPD exacerbation, pneumonia, reactive airway disease including asthma, CHF including exacerbation with or without pulmonary/interstitial edema, pneumothorax, ACS, thoracic trauma, and pulmonary embolism.  As part of my medical decision making, I reviewed the following data within the [facility] consult was requested and obtained from this/these consultant(s) vascular surgery.  I reviewed the patient's previous admission records as  well.  ----------------------------------------- 9:43 PM on 05/19/2017 -----------------------------------------  Patient found to have likely pneumonia as well as DVT. However, this is confounded by the patient having a hemothorax 1 month ago that was thought to be from a ruptured bleb. The patient wasn't on eliquis that time and was taken off of eliquis. I discussed this series of events with Dr. Lucky Cowboy of the vascular surgical service. We agreed that would be most beneficial at the least risk would be to start patient on heparin without a bolus. I discussed the plan with the patient as well as her family. We discussed the bleeding risk commensurate with the patient's history. They're understanding of the risks and willing to comply. The patient will be admitted to the hospital. Signed out to Dr. Claria Dice.  patient denies any blood in her stools.        ____________________________________________   FINAL CLINICAL IMPRESSION(S) / ED DIAGNOSES  hospital-acquired pneumonia. DVT.    NEW MEDICATIONS STARTED DURING THIS VISIT:  New Prescriptions   No medications on file     Note:  This document was prepared using Dragon voice recognition software and may include unintentional dictation errors.     Orbie Pyo, MD 05/19/17 [phone]

## 2017-05-20 ENCOUNTER — Inpatient Hospital Stay: Payer: Medicare Other

## 2017-05-20 DIAGNOSIS — E782 Mixed hyperlipidemia: Secondary | ICD-10-CM | POA: Diagnosis present

## 2017-05-20 DIAGNOSIS — I2699 Other pulmonary embolism without acute cor pulmonale: Secondary | ICD-10-CM | POA: Diagnosis not present

## 2017-05-20 DIAGNOSIS — I82622 Acute embolism and thrombosis of deep veins of left upper extremity: Secondary | ICD-10-CM | POA: Diagnosis present

## 2017-05-20 DIAGNOSIS — Y838 Other surgical procedures as the cause of abnormal reaction of the patient, or of later complication, without mention of misadventure at the time of the procedure: Secondary | ICD-10-CM | POA: Diagnosis present

## 2017-05-20 DIAGNOSIS — H9190 Unspecified hearing loss, unspecified ear: Secondary | ICD-10-CM | POA: Diagnosis present

## 2017-05-20 DIAGNOSIS — Y95 Nosocomial condition: Secondary | ICD-10-CM | POA: Diagnosis present

## 2017-05-20 DIAGNOSIS — R0602 Shortness of breath: Secondary | ICD-10-CM | POA: Diagnosis not present

## 2017-05-20 DIAGNOSIS — D509 Iron deficiency anemia, unspecified: Secondary | ICD-10-CM | POA: Diagnosis not present

## 2017-05-20 DIAGNOSIS — Z950 Presence of cardiac pacemaker: Secondary | ICD-10-CM | POA: Diagnosis not present

## 2017-05-20 DIAGNOSIS — J189 Pneumonia, unspecified organism: Secondary | ICD-10-CM | POA: Diagnosis present

## 2017-05-20 DIAGNOSIS — T82897A Other specified complication of cardiac prosthetic devices, implants and grafts, initial encounter: Secondary | ICD-10-CM | POA: Diagnosis present

## 2017-05-20 DIAGNOSIS — I82409 Acute embolism and thrombosis of unspecified deep veins of unspecified lower extremity: Secondary | ICD-10-CM | POA: Diagnosis not present

## 2017-05-20 DIAGNOSIS — K449 Diaphragmatic hernia without obstruction or gangrene: Secondary | ICD-10-CM | POA: Diagnosis present

## 2017-05-20 DIAGNOSIS — R600 Localized edema: Secondary | ICD-10-CM

## 2017-05-20 DIAGNOSIS — Z79899 Other long term (current) drug therapy: Secondary | ICD-10-CM | POA: Diagnosis not present

## 2017-05-20 DIAGNOSIS — F329 Major depressive disorder, single episode, unspecified: Secondary | ICD-10-CM | POA: Diagnosis present

## 2017-05-20 DIAGNOSIS — D500 Iron deficiency anemia secondary to blood loss (chronic): Secondary | ICD-10-CM | POA: Diagnosis present

## 2017-05-20 DIAGNOSIS — I4891 Unspecified atrial fibrillation: Secondary | ICD-10-CM | POA: Diagnosis not present

## 2017-05-20 DIAGNOSIS — I480 Paroxysmal atrial fibrillation: Secondary | ICD-10-CM | POA: Diagnosis present

## 2017-05-20 DIAGNOSIS — I871 Compression of vein: Secondary | ICD-10-CM | POA: Diagnosis present

## 2017-05-20 DIAGNOSIS — K641 Second degree hemorrhoids: Secondary | ICD-10-CM | POA: Diagnosis present

## 2017-05-20 DIAGNOSIS — I10 Essential (primary) hypertension: Secondary | ICD-10-CM | POA: Diagnosis present

## 2017-05-20 DIAGNOSIS — Z85828 Personal history of other malignant neoplasm of skin: Secondary | ICD-10-CM | POA: Diagnosis not present

## 2017-05-20 DIAGNOSIS — E876 Hypokalemia: Secondary | ICD-10-CM | POA: Diagnosis present

## 2017-05-20 DIAGNOSIS — R609 Edema, unspecified: Secondary | ICD-10-CM | POA: Diagnosis present

## 2017-05-20 LAB — BASIC METABOLIC PANEL
Anion gap: 10 (ref 5–15)
BUN: 24 mg/dL — AB (ref 6–20)
CALCIUM: 8.2 mg/dL — AB (ref 8.9–10.3)
CHLORIDE: 100 mmol/L — AB (ref 101–111)
CO2: 24 mmol/L (ref 22–32)
CREATININE: 0.93 mg/dL (ref 0.44–1.00)
GFR calc Af Amer: >60 mL/min (ref >60)
GFR calc non Af Amer: 54 mL/min — ABNORMAL LOW (ref >60)
Glucose, Bld: 103 mg/dL — ABNORMAL HIGH (ref 65–99)
Potassium: 3.3 mmol/L — ABNORMAL LOW (ref 3.5–5.1)
SODIUM: 134 mmol/L — AB (ref 135–145)

## 2017-05-20 LAB — HEPARIN LEVEL (UNFRACTIONATED)
HEPARIN UNFRACTIONATED: 0.55 [IU]/mL (ref 0.30–0.70)
Heparin Unfractionated: 0.45 IU/mL (ref 0.30–0.70)

## 2017-05-20 LAB — CBC
HCT: 27.6 % — ABNORMAL LOW (ref 35.0–47.0)
HEMOGLOBIN: 8.6 g/dL — AB (ref 12.0–16.0)
MCH: 23.8 pg — ABNORMAL LOW (ref 26.0–34.0)
MCHC: 31.1 g/dL — ABNORMAL LOW (ref 32.0–36.0)
MCV: 76.3 fL — ABNORMAL LOW (ref 80.0–100.0)
Platelets: 280 10*3/uL (ref 150–440)
RBC: 3.61 MIL/uL — AB (ref 3.80–5.20)
RDW: 19.3 % — ABNORMAL HIGH (ref 11.5–14.5)
WBC: 16.1 10*3/uL — ABNORMAL HIGH (ref 3.6–11.0)

## 2017-05-20 LAB — APTT: APTT: 37 s — AB (ref 24–36)

## 2017-05-20 LAB — MRSA PCR SCREENING: MRSA by PCR: NEGATIVE

## 2017-05-20 LAB — MAGNESIUM: Magnesium: 1.7 mg/dL (ref 1.7–2.4)

## 2017-05-20 MED ORDER — AMIODARONE HCL 200 MG PO TABS
200.0000 mg | ORAL_TABLET | Freq: Two times a day (BID) | ORAL | Status: DC
  Administered 2017-05-20 – 2017-05-24 (×10): 200 mg via ORAL
  Filled 2017-05-20 (×10): qty 1

## 2017-05-20 MED ORDER — PRO-STAT SUGAR FREE PO LIQD
30.0000 mL | Freq: Two times a day (BID) | ORAL | Status: DC
  Administered 2017-05-20 – 2017-05-22 (×3): 30 mL via ORAL

## 2017-05-20 MED ORDER — ACETAMINOPHEN 325 MG PO TABS: 650.0000 mg | ORAL_TABLET | Freq: Four times a day (QID) | ORAL | Status: DC | PRN

## 2017-05-20 MED ORDER — HYDROCODONE-ACETAMINOPHEN 5-325 MG PO TABS
1.0000 | ORAL_TABLET | Freq: Four times a day (QID) | ORAL | Status: DC | PRN
  Administered 2017-05-23: 2 via ORAL
  Filled 2017-05-20 (×2): qty 1

## 2017-05-20 MED ORDER — FLUOXETINE HCL 20 MG PO CAPS
40.0000 mg | ORAL_CAPSULE | Freq: Every day | ORAL | Status: DC
  Administered 2017-05-20 – 2017-05-24 (×4): 40 mg via ORAL
  Filled 2017-05-20 (×6): qty 2

## 2017-05-20 MED ORDER — METOPROLOL SUCCINATE ER 50 MG PO TB24
50.0000 mg | ORAL_TABLET | Freq: Every day | ORAL | Status: DC
  Administered 2017-05-20 – 2017-05-22 (×3): 50 mg via ORAL
  Filled 2017-05-20 (×4): qty 1

## 2017-05-20 MED ORDER — ONDANSETRON HCL 4 MG PO TABS: 4.0000 mg | ORAL_TABLET | Freq: Four times a day (QID) | ORAL | Status: DC | PRN

## 2017-05-20 MED ORDER — FUROSEMIDE 10 MG/ML IJ SOLN
40.0000 mg | Freq: Two times a day (BID) | INTRAMUSCULAR | Status: DC
  Administered 2017-05-20 – 2017-05-24 (×8): 40 mg via INTRAVENOUS
  Filled 2017-05-20 (×9): qty 4

## 2017-05-20 MED ORDER — TORSEMIDE 5 MG PO TABS
5.0000 mg | ORAL_TABLET | Freq: Every day | ORAL | Status: DC
  Administered 2017-05-21 – 2017-05-24 (×3): 5 mg via ORAL
  Filled 2017-05-20 (×6): qty 1

## 2017-05-20 MED ORDER — ACETAMINOPHEN 650 MG RE SUPP: 650.0000 mg | Freq: Four times a day (QID) | RECTAL | Status: DC | PRN

## 2017-05-20 MED ORDER — ALBUTEROL SULFATE (2.5 MG/3ML) 0.083% IN NEBU
2.5000 mg | INHALATION_SOLUTION | RESPIRATORY_TRACT | Status: DC | PRN
  Filled 2017-05-20: qty 3

## 2017-05-20 MED ORDER — POTASSIUM CHLORIDE CRYS ER 20 MEQ PO TBCR
40.0000 meq | EXTENDED_RELEASE_TABLET | Freq: Every day | ORAL | Status: DC
  Administered 2017-05-20 – 2017-05-24 (×5): 40 meq via ORAL
  Filled 2017-05-20 (×5): qty 2

## 2017-05-20 MED ORDER — ONDANSETRON HCL 4 MG/2ML IJ SOLN: 4.0000 mg | Freq: Four times a day (QID) | INTRAMUSCULAR | Status: DC | PRN

## 2017-05-20 MED ORDER — IOPAMIDOL (ISOVUE-370) INJECTION 76%
75.0000 mL | Freq: Once | INTRAVENOUS | Status: AC | PRN
  Administered 2017-05-20: 75 mL via INTRAVENOUS

## 2017-05-20 MED ORDER — POLYETHYLENE GLYCOL 3350 17 G PO PACK: 17.0000 g | PACK | Freq: Every day | ORAL | Status: DC | PRN

## 2017-05-20 MED ORDER — BISACODYL 5 MG PO TBEC: 5.0000 mg | DELAYED_RELEASE_TABLET | Freq: Every day | ORAL | Status: DC | PRN

## 2017-05-20 MED ORDER — SENNOSIDES-DOCUSATE SODIUM 8.6-50 MG PO TABS: 1.0000 | ORAL_TABLET | Freq: Every evening | ORAL | Status: DC | PRN

## 2017-05-20 MED ORDER — TRAZODONE HCL 50 MG PO TABS
50.0000 mg | ORAL_TABLET | Freq: Every evening | ORAL | Status: DC | PRN
  Administered 2017-05-20 – 2017-05-23 (×5): 50 mg via ORAL
  Filled 2017-05-20 (×5): qty 1

## 2017-05-20 NOTE — Progress Notes (Signed)
Patient was transferred from the ER following diagnosis of left upper arm occlusive DVT. On admission to the unit patient was A&O X4, has heparin infusing and on room air. Patient denied pain or being in any discomfort.  0300: Patient chest CT was called to Dr. Estanislado Pandy.

## 2017-05-20 NOTE — H&P (Signed)
PCP:   Einar Pheasant, MD   Chief Complaint:  LUE swelling  HPI: This is a 83 year old female who for the past week has developed increasing shortness of breath. She denies any fevers or chills, nausea vomiting. She states she's also been put on generalized fluid buildup. Last night she developed left upper extremity swelling, she came to ER today. Studies revealed the patient has a DVT.  Of note approximately a year the patient wasn't an request, she was admitted here with pneumonia and hemothorax. Her eloquis  was discontinued. She was on this for A fibrillation.  The patient has never had a history of DVTs. History provided by the patient as well as her family members present at bedside.  Review of Systems:  The patient denies anorexia, fever, weight loss,, vision loss, decreased hearing, hoarseness, chest pain, syncope, dyspnea on exertion, peripheral edema, balance deficits, hemoptysis, abdominal pain, melena, hematochezia, severe indigestion/heartburn, hematuria, incontinence, genital sores, muscle weakness, suspicious skin lesions, transient blindness, difficulty walking, depression, unusual weight change, abnormal bleeding, enlarged lymph nodes, angioedema, and breast masses.  Past Medical History: Past Medical History:  Diagnosis Date  . Anemia   . Anxiety 10/02/2012  . Atrial fibrillation ([place]) 11/13   new onset 11/13  . BCC (basal cell carcinoma), face 04/24/2016  . Benign essential hypertension 03/13/2015  . Cellulitis of right upper extremity 01/27/2017  . Constipation 01/13/2015  . Decreased hearing 05/21/2015  . Health care maintenance 10/14/2014   Mammogram 10/01/15 - Birads I.    . Hypercholesterolemia   . Hyperlipidemia, mixed 02/01/2017  . Hypertension   . Osteoarthritis    hands, feet  . Sick sinus syndrome ([place]) 01/04/2017   Overview:  Sp pacer placement 2018  . Syncope 12/08/2016   Past Surgical History:  Procedure Laterality Date  . APPENDECTOMY  1957  . CATARACT  EXTRACTION  2013   left eye  . COLONOSCOPY  09/06/2008  . INCISION AND DRAINAGE ABSCESS Right 01/29/2017   Procedure: INCISION AND DRAINAGE ABSCESS;  Surgeon: Florene Glen, MD;  Location: [facility] ORS;  Service: General;  Laterality: Right;  . KIDNEY SURGERY  age 72  . PACEMAKER INSERTION Left 12/14/2016   Procedure: INSERTION PACEMAKER;  Surgeon: Isaias Cowman, MD;  Location: [facility];  Service: Cardiovascular;  Laterality: Left;  . TUBAL LIGATION    . VIDEO ASSISTED THORACOSCOPY (VATS)/THOROCOTOMY Right 04/19/2017   Procedure: RIGHT THORACOSCOPY AND EVACUATION OF HEMOTHORAX;  Surgeon: Nestor Lewandowsky, MD;  Location: [facility];  Service: General;  Laterality: Right;    Medications: Prior to Admission medications   Medication Sig Start Date End Date Taking? Authorizing Provider  acetaminophen (TYLENOL) 325 MG tablet Take 2 tablets (650 mg total) by mouth every 6 (six) hours as needed for mild pain, moderate pain or fever (or Fever >/= 101). 12/11/16  Yes Gouru, Illene Silver, MD  albuterol (PROVENTIL) (2.5 MG/3ML) 0.083% nebulizer solution Take 3 mLs (2.5 mg total) by nebulization every 2 (two) hours as needed for wheezing. 04/28/17  Yes Gouru, Illene Silver, MD  Amino Acids-Protein Hydrolys (FEEDING SUPPLEMENT, PRO-STAT SUGAR FREE 64,) LIQD Take 30 mLs by mouth 2 (two) times daily between meals.   Yes [provider]  amiodarone (PACERONE) 200 MG tablet Take 1 tablet (200 mg total) by mouth 2 (two) times daily. 04/28/17  Yes Gouru, Illene Silver, MD  bisacodyl (DULCOLAX) 5 MG EC tablet Take 1 tablet (5 mg total) by mouth daily as needed for moderate constipation. 04/28/17  Yes Gouru, Illene Silver, MD  cephALEXin (KEFLEX) 500  MG capsule Take 500 mg by mouth 3 (three) times daily.   Yes [provider]  Cholecalciferol (VITAMIN D-3) 1000 UNITS CAPS Take 1 capsule by mouth daily.    Yes [provider]  FLUoxetine (PROZAC) 40 MG capsule Take 1 capsule (40 mg total) by mouth daily. 01/18/17  Yes Einar Pheasant, MD  HYDROcodone-acetaminophen (NORCO/VICODIN) 5-325 MG tablet Take 1-2 tablets by mouth every 6 (six) hours as needed for moderate pain. 04/29/17  Yes Toni Arthurs, NP  lovastatin (MEVACOR) 20 MG tablet Take 1 tablet (20 mg total) by mouth daily. 01/18/17  Yes Einar Pheasant, MD  metoprolol succinate (TOPROL-XL) 50 MG 24 hr tablet TAKE (1) TABLET BY MOUTH DAILY. TAKE WITH OR IMMEDIATELY FOLLOWING A MEAL 01/19/17  Yes Einar Pheasant, MD  Nutritional Supplements (ENSURE ENLIVE PO) Take 1 Bottle by mouth 2 (two) times daily between meals.   Yes [provider]  senna-docusate (SENOKOT-S) 8.6-50 MG tablet Take 1 tablet by mouth at bedtime as needed for mild constipation. 12/11/16  Yes Gouru, Illene Silver, MD  torsemide (DEMADEX) 5 MG tablet Take 5 mg by mouth daily.   Yes [provider]  traZODone (DESYREL) 50 MG tablet Take 1 tablet (50 mg total) by mouth at bedtime as needed for sleep. 04/28/17  Yes Gouru, Illene Silver, MD    Allergies:  No Known Allergies  Social History:  reports that she has never smoked. She has never used smokeless tobacco. She reports that she drinks alcohol. She reports that she does not use drugs.  Family History: Family History  Problem Relation Age of Onset  . Congestive Heart Failure Father   . Multiple myeloma Mother   . Hypertension Brother   . Alzheimer's disease Unknown        grandmother and great aunts  . Breast cancer Neg Hx   . Colon cancer Neg Hx     Physical Exam: Vitals:   05/19/17 2130 05/19/17 2300 05/20/17 0000 05/20/17 0030  BP: 116/90   (!) 116/54  Pulse: 65 69 69 70  Resp: (!) 25 18 (!) 31 (!) 26  Temp:      TempSrc:      SpO2: 92% 97% 97% 99%  Weight:        General:  Alert and oriented times three, well developed and nourished, no acute distress Eyes: PERRLA, pink conjunctiva, no scleral icterus ENT: Moist oral mucosa, neck supple, no thyromegaly Lungs: clear to ascultation, no wheeze, no crackles, no use of  accessory muscles Cardiovascular: regular rate and rhythm, no regurgitation, no gallops, no murmurs. No carotid bruits, no JVD Abdomen: soft, positive BS, non-tender, non-distended, no organomegaly, not an acute abdomen GU: not examined Neuro: CN II - XII grossly intact, sensation intact Musculoskeletal: strength 5/5 all extremities, no clubbing, edema, left UPPER EXTREMITY swelling Skin: no rash, no subcutaneous crepitation, no decubitus Psych: appropriate patient   Labs on Admission:   Recent Labs  05/19/17 1440 05/19/17 1933  NA 134* 133*  K 3.1* 3.6  CL 99* 99*  CO2 26 26  GLUCOSE 117* 134*  BUN 28* 27*  CREATININE 0.97 1.00  CALCIUM 8.4* 8.8*    Recent Labs  05/19/17 1440 05/19/17 1933  AST 21 19  ALT 16 17  ALKPHOS 96 107  BILITOT 0.9 1.0  PROT 6.0* 6.6  ALBUMIN 2.7* 2.9*   No results for input(s): LIPASE, AMYLASE in the last 72 hours.  Recent Labs  05/19/17 1440 05/19/17 1933  WBC 16.6* 17.1*  NEUTROABS  13.8* 14.0*  HGB 8.8* 8.7*  HCT 28.4* 27.7*  MCV 77.4* 76.5*  PLT 264 281    Recent Labs  05/19/17 1933  TROPONINI 0.05*   Invalid input(s): POCBNP No results for input(s): DDIMER in the last 72 hours. No results for input(s): HGBA1C in the last 72 hours. No results for input(s): CHOL, HDL, LDLCALC, TRIG, CHOLHDL, LDLDIRECT in the last 72 hours. No results for input(s): TSH, T4TOTAL, T3FREE, THYROIDAB in the last 72 hours.  Invalid input(s): FREET3 No results for input(s): VITAMINB12, FOLATE, FERRITIN, TIBC, IRON, RETICCTPCT in the last 72 hours.  Micro Results: No results found for this or any previous visit (from the past 240 hour(s)).   Radiological Exams on Admission: Dg Chest 2 View  Result Date: 05/19/2017 CLINICAL DATA:  Shortness of Breath.  Lower extremity swelling. EXAM: CHEST  2 VIEW COMPARISON:  04/13/2017 FINDINGS: Left pacer remains in place, unchanged. Cardiomegaly. Small bilateral pleural effusions. Continued right lower  lung airspace disease, likely pneumonia, slightly improved. Left base atelectasis. IMPRESSION: Continued right lower lobe airspace disease, likely pneumonia with right effusion, slightly improved. Small left effusion. Left base atelectasis. Electronically Signed   By: Rolm Baptise M.D.   On: 05/19/2017 20:15   US Venous Img Upper Uni Left  Result Date: 05/19/2017 CLINICAL DATA:  Shortness of breath. Left upper extremity pain and edema for the past 2 days. History of malignancy. EXAM: LEFT UPPER EXTREMITY VENOUS DOPPLER ULTRASOUND TECHNIQUE: Gray-scale sonography with graded compression, as well as color Doppler and duplex ultrasound were performed to evaluate the upper extremity deep venous system from the level of the subclavian vein and including the jugular, axillary, basilic, radial, ulnar and upper cephalic vein. Spectral Doppler was utilized to evaluate flow at rest and with distal augmentation maneuvers. COMPARISON:  None. FINDINGS: Contralateral Subclavian Vein: Respiratory phasicity is normal and symmetric with the symptomatic side. No evidence of thrombus. Normal compressibility. Internal Jugular Vein: No evidence of thrombus. Normal compressibility, respiratory phasicity and response to augmentation. There is mixed echogenic occlusive thrombus extending from the left subclavian (image 11), through the left axillary (image 17) extending to involve 1 of the paired brachial veins (image 35). The additional paired brachial vein appears patent. The radioulnar veins appear patent where imaged. There is occlusive superficial thrombophlebitis within the left cephalic (image 20) and basilic vein (image 25). Other Findings:  None visualized. IMPRESSION: 1. Extensive occlusive DVT extending from the left subclavian through one of the paired left brachial veins. 2. Extensive occlusive superficial thrombophlebitis involving the left cephalic and basilic veins. Electronically Signed   By: Sandi Mariscal M.D.   On:  05/19/2017 21:05    Assessment/Plan Present on Admission: . Left upper extremity DVT (deep venous thrombosis) (HCC) -admit to MedSurg -Heparin drip rdered -CT chest ordered to rule out PE, shortness of breath most likely due to pneumonia. If CT is positve we'll order 2-D echo, to evaluate for right heart strain  . Community acquired pneumonia -Blood cultures ordered -Continue cefepime and vancomycin -Oxygen, doing nebs, respiratory to evaluate and treat  . Atrial fibrillation (HCC) -Stable, heparin drip started  -Patient was not previously and anticoagulation  . Benign essential hypertension -Stable, home medications resumed  . Hyperlipidemia, mixed -Stable, home medications resumed  . Hypertension -  Alenah Sarria 05/20/2017, 1:49 AM

## 2017-05-20 NOTE — Progress Notes (Signed)
Talked to Dr. Margaretmary Eddy about patient's BP of 104/54, received Metoprolol 50mg , also schedule to received torsemide 5mg , order to hold for now and to recheck in an hour and if SBP is >110, order to give torsemide. RN will continue to monitor.

## 2017-05-20 NOTE — ED Notes (Signed)
Patient transported to CT 

## 2017-05-20 NOTE — ED Notes (Signed)
Pt assisted to restroom. Pt tolerated well. Pt and pt's family is aware that we are still awaiting the admitting doctor for orders to get pt to room.

## 2017-05-20 NOTE — Care Management (Signed)
It is not clear if patient presented from home or from skilled nursing facility.  there is a note on previous admission in September 2018 that patient has open APS case with [facility] . Admitted with DVT left upper extremity. Currently on heparin drip

## 2017-05-20 NOTE — Progress Notes (Signed)
ANTICOAGULATION CONSULT NOTE - Initial Consult  Pharmacy Consult for heparin Indication: upper extremity subclavian DVT  No Known Allergies  Patient Measurements: Height: 5\' 2"  (157.5 cm) Weight: 179 lb 6.4 oz (81.4 kg) IBW/kg (Calculated) : 50.1 Heparin Dosing Weight: 80.7 kg  Vital Signs: Temp: 97.7 F (36.5 C) (10/11 1205) Temp Source: Oral (10/11 1205) BP: 96/47 (10/11 1437) Pulse Rate: 69 (10/11 1205)  Labs:  Recent Labs  05/19/17 1440 05/19/17 1933 05/20/17 0257 05/20/17 0738 05/20/17 1622  HGB 8.8* 8.7* 8.6*  --   --   HCT 28.4* 27.7* 27.6*  --   --   PLT 264 281 280  --   --   APTT  --  37*  --   --   --   LABPROT  --  14.8  --   --   --   INR  --  1.17  --   --   --   HEPARINUNFRC  --   --   --  0.55 0.45  CREATININE 0.97 1.00 0.93  --   --   TROPONINI  --  0.05*  --   --   --     Estimated Creatinine Clearance: 42.9 mL/min (by C-G formula based on SCr of 0.93 mg/dL).   Medical History: Past Medical History:  Diagnosis Date  . Anemia   . Anxiety 10/02/2012  . Atrial fibrillation ([place]) 11/13   new onset 11/13  . BCC (basal cell carcinoma), face 04/24/2016  . Benign essential hypertension 03/13/2015  . Cellulitis of right upper extremity 01/27/2017  . Constipation 01/13/2015  . Decreased hearing 05/21/2015  . Health care maintenance 10/14/2014   Mammogram 10/01/15 - Birads I.    . Hypercholesterolemia   . Hyperlipidemia, mixed 02/01/2017  . Hypertension   . Osteoarthritis    hands, feet  . Sick sinus syndrome ([place]) 01/04/2017   Overview:  Sp pacer placement 2018  . Syncope 12/08/2016    Medications:  Scheduled:  . amiodarone  200 mg Oral BID  . feeding supplement (PRO-STAT SUGAR FREE 64)  30 mL Oral BID BM  . FLUoxetine  40 mg Oral Daily  . furosemide  40 mg Intravenous Q12H  . metoprolol succinate  50 mg Oral Daily  . potassium chloride  40 mEq Oral Daily  . torsemide  5 mg Oral Daily    Assessment: Patient is an 83 y/o female admitted for leg  swelling and upper extremity edema was found to have: 1. Extensive occlusive DVT extending from the left subclavian through one of the paired left brachial veins. 2. Extensive occlusive superficial thrombophlebitis involving the left cephalic and basilic veins. Patient is being started on heparin for lower and upper extremity DVT.  Goal of Therapy:  Heparin level 0.3-0.7 units/ml Monitor platelets by anticoagulation protocol: Yes   Plan:  Give 5000 units bolus x 1  Will start rate at 1200 units/hr and will check HL @ 0800. Baseline labs ordered Will adjust rate per HLs and monitor CBCs daily while on heparin.  10/11: HL @ 16:22 = 0.45 Will continue this pt on current rate and recheck HL on 10/12 with AM labs.   [place] D Clinical Pharmacist 05/20/2017

## 2017-05-20 NOTE — Consult Note (Signed)
[facility] Cardiology  CARDIOLOGY CONSULT NOTE  Patient ID: Kristin Avila MRN: [id number] DOB/AGE: November 29, 1927 83 y.o.  Admit date: 05/19/2017 Referring Physician Gouru Primary Physician Einar Pheasant, MD Primary Cardiologist Nehemiah Massed Reason for Consultation Atrial fibrillation, DVT, PE  HPI: 83 year old female referred for evaluation of atrial fibrillation while off of anticoagulation with a recent diagnosis of left upper extremity DVT and pulmonary embolism. The patient has a history of atrial fibrillation, essential hypertension, status post pacemaker insertion on 12/14/16 for sick sinus syndrome, and hyperlipidemia. The patient was recently admitted 04/18/17 for hemothorax felt to be secondary to spontaneous bullous rupture while on Eliquis. She underwent right thoracentesis for hemothorax on 04/19/17. Eliquis was held prior to this procedure and was not resumed postoperatively. The patient reports a 1 week history of shortness of breath induced by minimal exertion, as well as a 2-day history of left upper arm swelling and pain. The patient presented to [facility] ER today for this. Doppler was positive for upper extremity DVT. Chest CT positive for right pulmonary embolus. The patient was started on heparin drip. Admission labs notable for elevated WBC of 17.1, hemoglobin 8.8, hematocrit 28.4, potassium 3.1. Currently, the patient denies chest pain, palpitations, or shortness of breath at rest. She notes left arm discomfort.   Review of systems complete and found to be negative unless listed above     Past Medical History:  Diagnosis Date  . Anemia   . Anxiety 10/02/2012  . Atrial fibrillation ([place]) 11/13   new onset 11/13  . BCC (basal cell carcinoma), face 04/24/2016  . Benign essential hypertension 03/13/2015  . Cellulitis of right upper extremity 01/27/2017  . Constipation 01/13/2015  . Decreased hearing 05/21/2015  . Health care maintenance 10/14/2014   Mammogram 10/01/15 - Birads I.    .  Hypercholesterolemia   . Hyperlipidemia, mixed 02/01/2017  . Hypertension   . Osteoarthritis    hands, feet  . Sick sinus syndrome ([place]) 01/04/2017   Overview:  Sp pacer placement 2018  . Syncope 12/08/2016    Past Surgical History:  Procedure Laterality Date  . APPENDECTOMY  1957  . CATARACT EXTRACTION  2013   left eye  . COLONOSCOPY  09/06/2008  . INCISION AND DRAINAGE ABSCESS Right 01/29/2017   Procedure: INCISION AND DRAINAGE ABSCESS;  Surgeon: Florene Glen, MD;  Location: [facility] ORS;  Service: General;  Laterality: Right;  . KIDNEY SURGERY  age 71  . PACEMAKER INSERTION Left 12/14/2016   Procedure: INSERTION PACEMAKER;  Surgeon: Isaias Cowman, MD;  Location: [facility] ORS;  Service: Cardiovascular;  Laterality: Left;  . TUBAL LIGATION    . VIDEO ASSISTED THORACOSCOPY (VATS)/THOROCOTOMY Right 04/19/2017   Procedure: RIGHT THORACOSCOPY AND EVACUATION OF HEMOTHORAX;  Surgeon: Nestor Lewandowsky, MD;  Location: [facility] ORS;  Service: General;  Laterality: Right;    Prescriptions Prior to Admission  Medication Sig Dispense Refill Last Dose  . acetaminophen (TYLENOL) 325 MG tablet Take 2 tablets (650 mg total) by mouth every 6 (six) hours as needed for mild pain, moderate pain or fever (or Fever >/= 101).   Taking  . albuterol (PROVENTIL) (2.5 MG/3ML) 0.083% nebulizer solution Take 3 mLs (2.5 mg total) by nebulization every 2 (two) hours as needed for wheezing. 75 mL 12 Taking  . Amino Acids-Protein Hydrolys (FEEDING SUPPLEMENT, PRO-STAT SUGAR FREE 64,) LIQD Take 30 mLs by mouth 2 (two) times daily between meals.   Taking  . amiodarone (PACERONE) 200 MG tablet Take 1 tablet (200 mg total) by mouth 2 (two)  times daily.   Taking  . bisacodyl (DULCOLAX) 5 MG EC tablet Take 1 tablet (5 mg total) by mouth daily as needed for moderate constipation. 30 tablet 0 Taking  . cephALEXin (KEFLEX) 500 MG capsule Take 500 mg by mouth 3 (three) times daily.   Taking  . Cholecalciferol (VITAMIN D-3) 1000 UNITS  CAPS Take 1 capsule by mouth daily.    Taking  . FLUoxetine (PROZAC) 40 MG capsule Take 1 capsule (40 mg total) by mouth daily. 90 capsule 1 Taking  . HYDROcodone-acetaminophen (NORCO/VICODIN) 5-325 MG tablet Take 1-2 tablets by mouth every 6 (six) hours as needed for moderate pain. 120 tablet 0 Taking  . lovastatin (MEVACOR) 20 MG tablet Take 1 tablet (20 mg total) by mouth daily. 90 tablet 1 Taking  . metoprolol succinate (TOPROL-XL) 50 MG 24 hr tablet TAKE (1) TABLET BY MOUTH DAILY. TAKE WITH OR IMMEDIATELY FOLLOWING A MEAL 30 tablet 10 Taking  . Nutritional Supplements (ENSURE ENLIVE PO) Take 1 Bottle by mouth 2 (two) times daily between meals.   Taking  . senna-docusate (SENOKOT-S) 8.6-50 MG tablet Take 1 tablet by mouth at bedtime as needed for mild constipation.   Taking  . torsemide (DEMADEX) 5 MG tablet Take 5 mg by mouth daily.   Taking  . traZODone (DESYREL) 50 MG tablet Take 1 tablet (50 mg total) by mouth at bedtime as needed for sleep.   Taking   Social History   Social History  . Marital status: Widowed    Spouse name: N/A  . Number of children: 4  . Years of education: N/A   Occupational History  . Not on file.   Social History Main Topics  . Smoking status: Never Smoker  . Smokeless tobacco: Never Used  . Alcohol use 0.0 oz/week     Comment: occasional  . Drug use: No  . Sexual activity: Not on file   Other Topics Concern  . Not on file   Social History Narrative   Widowed   4 children   Never smoker   Occasionally drinks   Full Code    Family History  Problem Relation Age of Onset  . Congestive Heart Failure Father   . Multiple myeloma Mother   . Hypertension Brother   . Alzheimer's disease Unknown        grandmother and great aunts  . Breast cancer Neg Hx   . Colon cancer Neg Hx       Review of systems complete and found to be negative unless listed above      PHYSICAL EXAM  General: Elderly female sitting in chair, in no acute  distress HEENT:  Normocephalic and atramatic Neck:  No JVD.  Lungs: Normal effort of breathing Heart: HRRR . Normal S1 and S2 without gallops or murmurs.  Msk:  Back normal, sitting upright in chair. Normal strength and tone for age. Extremities: No clubbing, cyanosis or edema.  Left arm and hand swelling Neuro: Alert and oriented X 3. Psych:  Good affect, responds appropriately  Labs:   Lab Results  Component Value Date   WBC 16.1 (H) 05/20/2017   HGB 8.6 (L) 05/20/2017   HCT 27.6 (L) 05/20/2017   MCV 76.3 (L) 05/20/2017   PLT 280 05/20/2017    Recent Labs Lab 05/19/17 1933 05/20/17 0257  NA 133* 134*  K 3.6 3.3*  CL 99* 100*  CO2 26 24  BUN 27* 24*  CREATININE 1.00 0.93  CALCIUM 8.8* 8.2*  PROT 6.6  --  BILITOT 1.0  --   ALKPHOS 107  --   ALT 17  --   AST 19  --   GLUCOSE 134* 103*   Lab Results  Component Value Date   CKTOTAL 83 08/21/2012   CKMB 0.8 08/21/2012   TROPONINI 0.05 (HH) 05/19/2017    Lab Results  Component Value Date   CHOL 219 (H) 11/26/2016   CHOL 198 05/08/2016   CHOL 202 (H) 09/24/2015   Lab Results  Component Value Date   HDL 91.90 11/26/2016   HDL 80.40 05/08/2016   HDL 77.60 09/24/2015   Lab Results  Component Value Date   LDLCALC 113 (H) 11/26/2016   LDLCALC 106 (H) 05/08/2016   LDLCALC 112 (H) 09/24/2015   Lab Results  Component Value Date   TRIG 68.0 11/26/2016   TRIG 57.0 05/08/2016   TRIG 61.0 09/24/2015   Lab Results  Component Value Date   CHOLHDL 2 11/26/2016   CHOLHDL 2 05/08/2016   CHOLHDL 3 09/24/2015   Lab Results  Component Value Date   LDLDIRECT 105.8 09/06/2013      Radiology: Dg Chest 2 View  Result Date: 05/19/2017 CLINICAL DATA:  Shortness of Breath.  Lower extremity swelling. EXAM: CHEST  2 VIEW COMPARISON:  04/13/2017 FINDINGS: Left pacer remains in place, unchanged. Cardiomegaly. Small bilateral pleural effusions. Continued right lower lung airspace disease, likely pneumonia, slightly  improved. Left base atelectasis. IMPRESSION: Continued right lower lobe airspace disease, likely pneumonia with right effusion, slightly improved. Small left effusion. Left base atelectasis. Electronically Signed   By: Rolm Baptise M.D.   On: 05/19/2017 20:15   Dg Chest 2 View  Result Date: 05/13/2017 CLINICAL DATA:  Follow-up pneumonia EXAM: CHEST  2 VIEW COMPARISON:  05/06/2017, 04/28/2017, 04/18/2017, 12/14/2016 FINDINGS: Hyperinflation. Persistent small pleural effusions. No change in right lower lung pleural and parenchymal opacity. No change in right middle lobe opacity. Stable cardiomediastinal silhouette, slightly enlarged with atherosclerosis no pneumothorax. Left-sided pacing device as before IMPRESSION: 1. Continued right pleural and parenchymal disease in the right middle lobe and right lower lobe which may reflect combination of loculated pleural effusion and continued pneumonia. No significant change from prior. Continued radiographic follow-up recommended 2. Small left pleural effusion with left basilar atelectasis 3. Mild cardiomegaly Electronically Signed   By: Donavan Foil M.D.   On: 05/13/2017 16:16   Dg Chest 2 View  Result Date: 05/06/2017 CLINICAL DATA:  Cough. EXAM: CHEST  2 VIEW COMPARISON:  Radiographs of April 28, 2017. FINDINGS: Stable cardiomegaly. Atherosclerosis of thoracic aorta is noted. No pneumothorax is noted. Increased right middle lobe opacity is noted concerning for pneumonia or atelectasis with mild associated pleural effusion. Right-sided chest tube has been removed. Left-sided pacemaker is unchanged in position. Minimal left pleural effusion is noted. Bony thorax is unremarkable. IMPRESSION: Aortic atherosclerosis. Stable cardiomegaly. Increased right middle lobe pneumonia or atelectasis is noted with mild associated pleural effusion. Electronically Signed   By: Marijo Conception, M.D.   On: 05/06/2017 17:13   Dg Chest 2 View  Result Date: 04/27/2017 CLINICAL  DATA:  Post right thoracotomy for evacuation of hemothorax 1 week ago EXAM: CHEST  2 VIEW COMPARISON:  Chest x-ray of 04/26/2017 FINDINGS: This still appears to be a tiny right apical pneumothorax present with right chest tube noted. Small left pleural effusion remains. Cardiomegaly is stable. Permanent pacemaker is unchanged. IMPRESSION: 1. Little change in tiny right apical pneumothorax. Right chest tube remains. 2. Small left pleural effusion is unchanged. Stable  cardiomegaly with pacer. Electronically Signed   By: Ivar Drape M.D.   On: 04/27/2017 08:13   Dg Chest 2 View  Result Date: 04/23/2017 CLINICAL DATA:  Postoperative evaluation.  Thoracoscopy 4 days ago. EXAM: CHEST  2 VIEW COMPARISON:  Yesterday FINDINGS: Small right apical pneumothorax, less than 5%, unchanged. Right-sided chest tubes in stable position, 1 tip at the apex, the other at the base. There is artifact from EKG leads. Dual-chamber pacer from the left. Small left pleural effusion and retrocardiac lung opacity. Stable cardiomegaly. IMPRESSION: Stable small right apical pneumothorax, less than 5%. Chest tubes are in stable position. Electronically Signed   By: Monte Fantasia M.D.   On: 04/23/2017 08:38   Dg Chest 2 View  Result Date: 04/22/2017 CLINICAL DATA:  Status post thoracoscopy and evacuation of a hemothorax on the right 3 days ago. EXAM: CHEST  2 VIEW COMPARISON:  Portable chest x-ray of April 20, 2017 FINDINGS: The lungs are adequately inflated. A tiny right apical pneumothorax amounting to less than 5% of the lung volume is noted. The 2 right-sided chest tubes are in stable position. There is no right pleural effusion. On the left there is basilar atelectasis and small effusion. The heart is enlarged. The pulmonary vascularity is mildly prominent centrally. The ICD is in stable position. There is calcification in the wall of the aortic arch. IMPRESSION: 5% or less right apical pneumothorax. Stable positioning of the chest  tubes with the uppermost tube tip projecting over the medial aspect of the right fourth posterior rib. Left lower lobe atelectasis or pneumonia increased since the study of 2 days ago periods probable trace left pleural effusion. Thoracic aortic atherosclerosis. Electronically Signed   By: David  Martinique M.D.   On: 04/22/2017 07:24   Ct Angio Chest Pe W Or Wo Contrast  Result Date: 05/20/2017 CLINICAL DATA:  Acute onset of shortness of breath and bilateral lower extremity swelling. Tiredness. Initial encounter. EXAM: CT ANGIOGRAPHY CHEST WITH CONTRAST TECHNIQUE: Multidetector CT imaging of the chest was performed using the standard protocol during bolus administration of intravenous contrast. Multiplanar CT image reconstructions and MIPs were obtained to evaluate the vascular anatomy. CONTRAST:  75 mL of Isovue 370 IV contrast COMPARISON:  Chest radiograph performed 05/19/2017 FINDINGS: Cardiovascular: Pulmonary embolus is noted within segmental and subsegmental branches to the right middle and lower lobes. The RV/LV ratio is 1.2, which may reflect right heart strain or some degree of underlying chronic right heart failure, given diffuse enlargement of the right side of the heart. The heart is enlarged. A pacemaker is noted at the left chest wall, with leads ending at the right atrium and right ventricle. Scattered calcification is noted along the aortic arch and proximal great vessels. Mediastinum/Nodes: The mediastinum is otherwise unremarkable. No mediastinal lymphadenopathy is seen. No significant pericardial effusion is seen. The visualized portions of the thyroid gland are grossly unremarkable. No axillary lymphadenopathy is appreciated. Vague edema is noted at the left axilla and extending inferiorly along the left chest wall, of uncertain significance. Lungs/Pleura: Small bilateral pleural effusions are noted, with underlying interstitial prominence and partial consolidation of the lower lobes  bilaterally. This may reflect some degree of underlying interstitial edema. Mild peripheral opacities are noted within the left upper lobe, of uncertain significance. Would correlate for evidence of infection. No pneumothorax is seen. No dominant mass is identified. Fluid tracks along the right major fissure. Upper Abdomen: The visualized portions of the liver are unremarkable. There is reflux of contrast into  the hepatic veins and IVC. Hypoattenuation along the periphery of the spleen may reflect remote traumatic injury. Musculoskeletal: No acute osseous abnormalities are identified. Degenerative change is noted at the left glenohumeral joint, with mild bony remodeling at the left humeral head. Overlying osseous fragments may reflect chronic changes of calcific tendinitis. The visualized musculature is unremarkable in appearance. Review of the MIP images confirms the above findings. IMPRESSION: 1. Pulmonary embolus within segmental and subsegmental branches to the right middle and lower lung lobes. RV/LV ratio of 1.2 may reflect right heart strain or some degree of underlying chronic right heart failure, given diffuse enlargement of the right side of the heart. Right heart strain would be consistent with at least submassive (intermediate risk) PE. The presence of right heart strain has been associated with an increased risk of morbidity and mortality. 2. Small bilateral pleural effusions, with underlying interstitial prominence and partial consolidation of the lower lobes bilaterally. This may reflect some degree of underlying interstitial edema. Mild peripheral opacities at the left upper lobe are of uncertain significance. Would correlate for evidence of infection. 3. Osseous fragments overlying the left glenohumeral joint may reflect changes of calcific tendinitis. Underlying degenerative change at the left glenohumeral joint, with mild bony remodeling at the left humeral head. 4. Hypoattenuation along the  periphery of the spleen may reflect remote traumatic injury. Critical Value/emergent results were called by telephone at the time of interpretation on 05/20/2017 at 2:54 am to [facility] [place], who verbally acknowledged these results. Electronically Signed   By: Garald Balding M.D.   On: 05/20/2017 02:57   US Venous Img Upper Uni Left  Result Date: 05/19/2017 CLINICAL DATA:  Shortness of breath. Left upper extremity pain and edema for the past 2 days. History of malignancy. EXAM: LEFT UPPER EXTREMITY VENOUS DOPPLER ULTRASOUND TECHNIQUE: Gray-scale sonography with graded compression, as well as color Doppler and duplex ultrasound were performed to evaluate the upper extremity deep venous system from the level of the subclavian vein and including the jugular, axillary, basilic, radial, ulnar and upper cephalic vein. Spectral Doppler was utilized to evaluate flow at rest and with distal augmentation maneuvers. COMPARISON:  None. FINDINGS: Contralateral Subclavian Vein: Respiratory phasicity is normal and symmetric with the symptomatic side. No evidence of thrombus. Normal compressibility. Internal Jugular Vein: No evidence of thrombus. Normal compressibility, respiratory phasicity and response to augmentation. There is mixed echogenic occlusive thrombus extending from the left subclavian (image 11), through the left axillary (image 17) extending to involve 1 of the paired brachial veins (image 35). The additional paired brachial vein appears patent. The radioulnar veins appear patent where imaged. There is occlusive superficial thrombophlebitis within the left cephalic (image 20) and basilic vein (image 25). Other Findings:  None visualized. IMPRESSION: 1. Extensive occlusive DVT extending from the left subclavian through one of the paired left brachial veins. 2. Extensive occlusive superficial thrombophlebitis involving the left cephalic and basilic veins. Electronically Signed    By: Sandi Mariscal M.D.   On: 05/19/2017 21:05   Dg Chest Port 1 View  Result Date: 04/28/2017 CLINICAL DATA:  Postop, chest tube clamped EXAM: PORTABLE CHEST 1 VIEW COMPARISON:  04/28/2017 at 0739 hours FINDINGS: Tiny right apical pneumothorax, unchanged.  Stable right chest tube. Small left pleural effusion.  No frank interstitial edema. The heart is normal in size.  Left subclavian pacemaker. IMPRESSION: Tiny right apical pneumothorax, unchanged.  Stable right chest tube. Electronically Signed   By: Henderson Newcomer.D.  On: 04/28/2017 14:09   Dg Chest Port 1 View  Result Date: 04/28/2017 CLINICAL DATA:  Right pneumothorax. EXAM: PORTABLE CHEST 1 VIEW COMPARISON:  04/27/2017 FINDINGS: Single right-sided chest tube remains in place. Tiny residual right apical pneumothorax is essentially unchanged. There is increased accentuation of the interstitial markings at the lung bases suggesting mild interstitial edema. Chronic cardiomegaly. Pacemaker in place. Small persistent left effusion. No appreciable right effusion. Blunting of the right costophrenic angle laterally. IMPRESSION: No change in tiny right apical pneumothorax. New interstitial accentuation suggesting mild pulmonary edema. Small left effusion, stable. Electronically Signed   By: Lorriane Shire M.D.   On: 04/28/2017 08:29   Dg Chest Port 1 View  Result Date: 04/26/2017 CLINICAL DATA:  Status post right thoracoscopy. EXAM: PORTABLE CHEST 1 VIEW COMPARISON:  Radiograph of April 25, 2017. FINDINGS: Stable cardiomegaly. Atherosclerosis of thoracic aorta is noted. Left-sided pacemaker is unchanged in position. Stable mild right apical pneumothorax is noted. Right-sided chest tube is unchanged in position. Stable bibasilar subsegmental atelectasis with associated pleural effusions is noted. Bony thorax is unremarkable. Degenerative changes seen involving the left glenohumeral joint. IMPRESSION: Aortic atherosclerosis. Stable bibasilar  subsegmental atelectasis with associated pleural effusions. Stable mild right apical pneumothorax. Right-sided chest tube is unchanged in position. Electronically Signed   By: Marijo Conception, M.D.   On: 04/26/2017 07:46   Dg Chest Port 1 View  Result Date: 04/25/2017 CLINICAL DATA:  Right-sided chest tube.  Follow-up of pneumothorax. EXAM: PORTABLE CHEST 1 VIEW COMPARISON:  04/24/2017 FINDINGS: Dual lead pacer. Midline trachea. Mild cardiomegaly with transverse aortic atherosclerosis. Right hemidiaphragm elevation. Trace bilateral pleural effusions are similar. Approximately 5-10% right apical pneumothorax identified. Slightly decreased today, with visceral pleural line 10 mm from chest wall versus 13 mm yesterday. right chest tube remains in place. Resolution of congestive heart failure. Bibasilar airspace disease remains. IMPRESSION: Slight decrease in 5-10% right apical pneumothorax. Cardiomegaly with persistent bibasilar atelectasis and probable small bilateral pleural effusions. Electronically Signed   By: Abigail Miyamoto M.D.   On: 04/25/2017 08:58   Dg Chest Port 1 View  Result Date: 04/24/2017 CLINICAL DATA:  Pneumothorax. EXAM: PORTABLE CHEST 1 VIEW COMPARISON:  Chest x-ray from yesterday. FINDINGS: Interval removal of the right apical chest tube. The right basilar chest tube is unchanged in position. The right apical pneumothorax is slightly increased in size, now approximately 10%. Stable cardiomegaly. Unchanged left chest wall AICD. Mild pulmonary vascular congestion. Unchanged left basilar opacity and small bilateral pleural effusions. IMPRESSION: 1. Interval removal of the right apical chest tube. Small right apical pneumothorax is slightly increased in size, now approximately 10%. 2. Stable cardiomegaly, mild pulmonary vascular congestion, and small bilateral pleural effusions. Unchanged left basilar atelectasis versus infiltrate. These results will be called to the ordering clinician or  representative by the Radiologist Assistant, and communication documented in the PACS or zVision Dashboard. Electronically Signed   By: Titus Dubin M.D.   On: 04/24/2017 08:39    EKG: Dual-paced rhythm, 62 bpm  ASSESSMENT AND PLAN:  1. Atrial fibrillation, previously on Eliquis, which was discontinued about 1 month ago prior to thoracentesis for hemothorax which was felt to be secondary to spontaneous bullous rupture. Eliquis was not resumed post-operatively. Currently in sinus rhythm; appears asymptomatic. 2. DVT of left upper extremity and right pulmonary embolus, currently on heparin drip.  3. Community acquired penumonia, on cefepime and vancomycin 4. Status post dual-chamber pacemaker for SSS and syncope. Pacemaker in good position before chest xray.  5.  Hypertension, stable on home medications 6. Hyperlipidemia, stable 7. Anemia, hemoglobin 8.8, hematocrit 28.4 8. Hypokalemia, replete  Recommendations: 1. Agree with overall therapy. 2. Plan to resume Eliquis after heparin therapy complete 3. Review 2D echocardiogram   Signed: Clabe Seal, {PA-C 05/20/2017, 4:31 PM

## 2017-05-20 NOTE — Progress Notes (Signed)
Pharmacy Antibiotic Note  Kristin Avila is a 83 y.o. female admitted on 05/19/2017 with pneumonia.  Pharmacy has been consulted for vanc/cefepime dosing.  Plan: Patient received vanc 1g IV and cefepime 2g IV x 1 in ED  Will f/u w/ vanc 1.25g IV q24h w/ 6 hr stack dose. Will draw vanc trough 10/14 @ 0300 prior to 4th dose. Will continue cefepime 2g IV q12h per CrCl 30 - 60 ml/min  Ke 0.0373 T1/2 18 ~ 24 hrs  Goal trough 15 - 20 mcg/mL  Weight: 178 lb (80.7 kg)  Temp (24hrs), Avg:98.7 F (37.1 C), Min:98.6 F (37 C), Max:98.8 F (37.1 C)   Recent Labs Lab 05/19/17 1440 05/19/17 1933  WBC 16.6* 17.1*  CREATININE 0.97 1.00    Estimated Creatinine Clearance: 39.7 mL/min (by C-G formula based on SCr of 1 mg/dL).    No Known Allergies  Thank you for allowing pharmacy to be a part of this patient's care.  Tobie Lords, PharmD, BCPS Clinical Pharmacist 05/20/2017

## 2017-05-20 NOTE — Progress Notes (Signed)
[place] [facility] [place] NAME: Kristin Avila    MR#:  [id number]  DATE OF BIRTH:  1928-04-04  SUBJECTIVE:  CHIEF COMPLAINT:  Patient denies any chest pain or shortness of breath is reporting swelling of the left upper extremity Son at bedside  REVIEW OF SYSTEMS:  CONSTITUTIONAL: No fever, fatigue or weakness.  EYES: No blurred or double vision.  EARS, NOSE, AND THROAT: No tinnitus or ear pain.  RESPIRATORY: No cough, shortness of breath, wheezing or hemoptysis.  CARDIOVASCULAR: No chest pain, orthopnea, edema.  GASTROINTESTINAL: No nausea, vomiting, diarrhea or abdominal pain.  GENITOURINARY: No dysuria, hematuria.  ENDOCRINE: No polyuria, nocturia,  HEMATOLOGY: No anemia, easy bruising or bleeding SKIN: No rash or lesion. MUSCULOSKELETAL: No joint pain or arthritis.  Left upper extremity is swollen NEUROLOGIC: No tingling, numbness, weakness.  PSYCHIATRY: No anxiety or depression.   DRUG ALLERGIES:  No Known Allergies  VITALS:  Blood pressure (!) 113/53, pulse 69, temperature 97.7 F (36.5 C), temperature source Oral, resp. rate 14, height 5\' 2"  (1.575 m), weight 81.4 kg (179 lb 6.4 oz), SpO2 99 %.  PHYSICAL EXAMINATION:  GENERAL:  83 y.o.-year-old patient lying in the bed with no acute distress.  EYES: Pupils equal, round, reactive to light and accommodation. No scleral icterus. Extraocular muscles intact.  HEENT: Head atraumatic, normocephalic. Oropharynx and nasopharynx clear.  NECK:  Supple, no jugular venous distention. No thyroid enlargement, no tenderness.  LUNGS: Normal breath sounds bilaterally, no wheezing, rales,rhonchi or crepitation. No use of accessory muscles of respiration.  CARDIOVASCULAR: S1, S2 normal. No murmurs, rubs, or gallops.  ABDOMEN: Soft, nontender, nondistended. Bowel sounds present. No organomegaly or mass.  EXTREMITIES: Left upper extremity edematous and tender. No pedal edema, cyanosis, or  clubbing.  NEUROLOGIC: Cranial nerves II through XII are intact. Muscle strength 5/5 in all extremities. Sensation intact. Gait not checked.  PSYCHIATRIC: The patient is alert and oriented x 3.  SKIN: No obvious rash, lesion, or ulcer.    LABORATORY PANEL:   CBC  Recent Labs Lab 05/20/17 0257  WBC 16.1*  HGB 8.6*  HCT 27.6*  PLT 280   ------------------------------------------------------------------------------------------------------------------  Chemistries   Recent Labs Lab 05/19/17 1933 05/20/17 0257  NA 133* 134*  K 3.6 3.3*  CL 99* 100*  CO2 26 24  GLUCOSE 134* 103*  BUN 27* 24*  CREATININE 1.00 0.93  CALCIUM 8.8* 8.2*  AST 19  --   ALT 17  --   ALKPHOS 107  --   BILITOT 1.0  --    ------------------------------------------------------------------------------------------------------------------  Cardiac Enzymes  Recent Labs Lab 05/19/17 1933  TROPONINI 0.05*   ------------------------------------------------------------------------------------------------------------------  RADIOLOGY:  Dg Chest 2 View  Result Date: 05/19/2017 CLINICAL DATA:  Shortness of Breath.  Lower extremity swelling. EXAM: CHEST  2 VIEW COMPARISON:  04/13/2017 FINDINGS: Left pacer remains in place, unchanged. Cardiomegaly. Small bilateral pleural effusions. Continued right lower lung airspace disease, likely pneumonia, slightly improved. Left base atelectasis. IMPRESSION: Continued right lower lobe airspace disease, likely pneumonia with right effusion, slightly improved. Small left effusion. Left base atelectasis. Electronically Signed   By: Rolm Baptise M.D.   On: 05/19/2017 20:15   Ct Angio Chest Pe W Or Wo Contrast  Result Date: 05/20/2017 CLINICAL DATA:  Acute onset of shortness of breath and bilateral lower extremity swelling. Tiredness. Initial encounter. EXAM: CT ANGIOGRAPHY CHEST WITH CONTRAST TECHNIQUE: Multidetector CT imaging of the chest was performed using the  standard protocol during bolus administration  of intravenous contrast. Multiplanar CT image reconstructions and MIPs were obtained to evaluate the vascular anatomy. CONTRAST:  75 mL of Isovue 370 IV contrast COMPARISON:  Chest radiograph performed 05/19/2017 FINDINGS: Cardiovascular: Pulmonary embolus is noted within segmental and subsegmental branches to the right middle and lower lobes. The RV/LV ratio is 1.2, which may reflect right heart strain or some degree of underlying chronic right heart failure, given diffuse enlargement of the right side of the heart. The heart is enlarged. A pacemaker is noted at the left chest wall, with leads ending at the right atrium and right ventricle. Scattered calcification is noted along the aortic arch and proximal great vessels. Mediastinum/Nodes: The mediastinum is otherwise unremarkable. No mediastinal lymphadenopathy is seen. No significant pericardial effusion is seen. The visualized portions of the thyroid gland are grossly unremarkable. No axillary lymphadenopathy is appreciated. Vague edema is noted at the left axilla and extending inferiorly along the left chest wall, of uncertain significance. Lungs/Pleura: Small bilateral pleural effusions are noted, with underlying interstitial prominence and partial consolidation of the lower lobes bilaterally. This may reflect some degree of underlying interstitial edema. Mild peripheral opacities are noted within the left upper lobe, of uncertain significance. Would correlate for evidence of infection. No pneumothorax is seen. No dominant mass is identified. Fluid tracks along the right major fissure. Upper Abdomen: The visualized portions of the liver are unremarkable. There is reflux of contrast into the hepatic veins and IVC. Hypoattenuation along the periphery of the spleen may reflect remote traumatic injury. Musculoskeletal: No acute osseous abnormalities are identified. Degenerative change is noted at the left glenohumeral  joint, with mild bony remodeling at the left humeral head. Overlying osseous fragments may reflect chronic changes of calcific tendinitis. The visualized musculature is unremarkable in appearance. Review of the MIP images confirms the above findings. IMPRESSION: 1. Pulmonary embolus within segmental and subsegmental branches to the right middle and lower lung lobes. RV/LV ratio of 1.2 may reflect right heart strain or some degree of underlying chronic right heart failure, given diffuse enlargement of the right side of the heart. Right heart strain would be consistent with at least submassive (intermediate risk) PE. The presence of right heart strain has been associated with an increased risk of morbidity and mortality. 2. Small bilateral pleural effusions, with underlying interstitial prominence and partial consolidation of the lower lobes bilaterally. This may reflect some degree of underlying interstitial edema. Mild peripheral opacities at the left upper lobe are of uncertain significance. Would correlate for evidence of infection. 3. Osseous fragments overlying the left glenohumeral joint may reflect changes of calcific tendinitis. Underlying degenerative change at the left glenohumeral joint, with mild bony remodeling at the left humeral head. 4. Hypoattenuation along the periphery of the spleen may reflect remote traumatic injury. Critical Value/emergent results were called by telephone at the time of interpretation on 05/20/2017 at 2:54 am to [facility] [place], who verbally acknowledged these results. Electronically Signed   By: Garald Balding M.D.   On: 05/20/2017 02:57   US Venous Img Upper Uni Left  Result Date: 05/19/2017 CLINICAL DATA:  Shortness of breath. Left upper extremity pain and edema for the past 2 days. History of malignancy. EXAM: LEFT UPPER EXTREMITY VENOUS DOPPLER ULTRASOUND TECHNIQUE: Gray-scale sonography with graded compression, as well as color Doppler  and duplex ultrasound were performed to evaluate the upper extremity deep venous system from the level of the subclavian vein and including the jugular, axillary, basilic, radial, ulnar  and upper cephalic vein. Spectral Doppler was utilized to evaluate flow at rest and with distal augmentation maneuvers. COMPARISON:  None. FINDINGS: Contralateral Subclavian Vein: Respiratory phasicity is normal and symmetric with the symptomatic side. No evidence of thrombus. Normal compressibility. Internal Jugular Vein: No evidence of thrombus. Normal compressibility, respiratory phasicity and response to augmentation. There is mixed echogenic occlusive thrombus extending from the left subclavian (image 11), through the left axillary (image 17) extending to involve 1 of the paired brachial veins (image 35). The additional paired brachial vein appears patent. The radioulnar veins appear patent where imaged. There is occlusive superficial thrombophlebitis within the left cephalic (image 20) and basilic vein (image 25). Other Findings:  None visualized. IMPRESSION: 1. Extensive occlusive DVT extending from the left subclavian through one of the paired left brachial veins. 2. Extensive occlusive superficial thrombophlebitis involving the left cephalic and basilic veins. Electronically Signed   By: Sandi Mariscal M.D.   On: 05/19/2017 21:05    EKG:   Orders placed or performed during the hospital encounter of 05/19/17  . ED EKG  . ED EKG    ASSESSMENT AND PLAN:    . Left upper extremity DVT (deep venous thrombosis) (HCC)And pulmonary embolism   -Heparin drip  -CT chest with PE, with right heart strain Will get echocardiogram -Vascular surgery consulted for possible embolectomy  . Community acquired pneumonia -Blood cultures ordered -Continue cefepime and vancomycin -Oxygen, doing nebs, respiratory to evaluate and treat  . Atrial fibrillation (HCC) -Stable, heparin drip will be continued for now -Patient was  not previously and anticoagulation -Patient was seen by [facility] cardiology consult the same  . Benign essential hypertension -Stable, home medications resumed  . Hypokalemia-replete and recheck in a.m.  Marland Kitchen Hyperlipidemia, mixed -Stable, home medications resumed     All the records are reviewed and case discussed with Care Management/Social Workerr. Management plans discussed with the patient, son and they are in agreement.  CODE STATUS: fc   TOTAL TIME TAKING CARE OF THIS PATIENT: 36  minutes.   POSSIBLE D/C IN 2  DAYS, DEPENDING ON CLINICAL CONDITION.  Note: This dictation was prepared with Dragon dictation along with smaller phrase technology. Any transcriptional errors that result from this process are unintentional.   Nicholes Mango M.D on 05/20/2017 at 1:54 PM  Between 7am to 6pm - Pager - [phone] After 6pm go to [email or web address] - password [email or web address] [facility]  Office  [phone]  CC: Primary care physician; Einar Pheasant, MD

## 2017-05-20 NOTE — Progress Notes (Signed)
ANTICOAGULATION CONSULT NOTE - Initial Consult  Pharmacy Consult for heparin Indication: upper extremity subclavian DVT  No Known Allergies  Patient Measurements: Height: 5\' 2"  (157.5 cm) Weight: 179 lb 6.4 oz (81.4 kg) IBW/kg (Calculated) : 50.1 Heparin Dosing Weight: 80.7 kg  Vital Signs: Temp: 97.8 F (36.6 C) (10/11 0249) Temp Source: Oral (10/11 0249) BP: 127/60 (10/11 0249) Pulse Rate: 70 (10/11 0249)  Labs:  Recent Labs  05/19/17 1440 05/19/17 1933 05/20/17 0257 05/20/17 0738  HGB 8.8* 8.7* 8.6*  --   HCT 28.4* 27.7* 27.6*  --   PLT 264 281 280  --   APTT  --  37*  --   --   LABPROT  --  14.8  --   --   INR  --  1.17  --   --   HEPARINUNFRC  --   --   --  0.55  CREATININE 0.97 1.00 0.93  --   TROPONINI  --  0.05*  --   --     Estimated Creatinine Clearance: 42.9 mL/min (by C-G formula based on SCr of 0.93 mg/dL).   Medical History: Past Medical History:  Diagnosis Date  . Anemia   . Anxiety 10/02/2012  . Atrial fibrillation ([place]) 11/13   new onset 11/13  . BCC (basal cell carcinoma), face 04/24/2016  . Benign essential hypertension 03/13/2015  . Cellulitis of right upper extremity 01/27/2017  . Constipation 01/13/2015  . Decreased hearing 05/21/2015  . Health care maintenance 10/14/2014   Mammogram 10/01/15 - Birads I.    . Hypercholesterolemia   . Hyperlipidemia, mixed 02/01/2017  . Hypertension   . Osteoarthritis    hands, feet  . Sick sinus syndrome ([place]) 01/04/2017   Overview:  Sp pacer placement 2018  . Syncope 12/08/2016    Medications:  Scheduled:  . amiodarone  200 mg Oral BID  . feeding supplement (PRO-STAT SUGAR FREE 64)  30 mL Oral BID BM  . FLUoxetine  40 mg Oral Daily  . furosemide  40 mg Intravenous Q12H  . metoprolol succinate  50 mg Oral Daily  . torsemide  5 mg Oral Daily    Assessment: Patient is an 83 y/o female admitted for leg swelling and upper extremity edema was found to have: 1. Extensive occlusive DVT extending from the  left subclavian through one of the paired left brachial veins. 2. Extensive occlusive superficial thrombophlebitis involving the left cephalic and basilic veins. 3. Pulmonary embolus is noted within segmental and subsegmental branches to the right middle/lower lobes   Patient is on heparin for lower/upper extremity DVT and PE.   Goal of Therapy:  Heparin level 0.3-0.7 units/ml Monitor platelets by anticoagulation protocol: Yes   Plan:  Give 5000 units bolus x 1  Will start rate at 1200 units/hr and will check HL @ 0800. Baseline labs ordered Will adjust rate per HL and monitor CBCs daily while on heparin.  10/11 @ 0738 HL therapeutic at 0.55. Will continue current rate and check HL @ 1600.    [place] Resident  05/20/2017

## 2017-05-20 NOTE — Progress Notes (Signed)
Patient presented to the hospital with swelling of the left arm subsequent workup has identified the possibility of right middle and lower lobe pulmonary emboli. She has a history of A. fib in the past has been on anticoagulation.  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 Conservative therapies with the left arm including elevation and compression sleeve are recommended. Anticoagulation would not be contraindicated however if other comorbidities would prevent anticoagulation this would not necessarily be a major issue.  With respect to the pulmonary emboli a have personally reviewed the CT scan and do not appreciate these emboli. There seemed to be quite a few other findings on the scan which would explain her cardiopulmonary symptoms. Nevertheless, should cardiology feel that restarting anticoagulation would be of benefit given her atrial fibrillation then the proposed pulmonary emboli as well as any DVT in the arm would be simultaneously treated.  Full consult to follow

## 2017-05-21 ENCOUNTER — Inpatient Hospital Stay
Admit: 2017-05-21 | Discharge: 2017-05-21 | Disposition: A | Discharge status: Home / Self Care | Payer: Medicare Other | Attending: Internal Medicine | Admitting: Internal Medicine

## 2017-05-21 DIAGNOSIS — I82409 Acute embolism and thrombosis of unspecified deep veins of unspecified lower extremity: Secondary | ICD-10-CM

## 2017-05-21 DIAGNOSIS — I4891 Unspecified atrial fibrillation: Secondary | ICD-10-CM

## 2017-05-21 LAB — BASIC METABOLIC PANEL
Anion gap: 6 (ref 5–15)
BUN: 25 mg/dL — ABNORMAL HIGH (ref 6–20)
CALCIUM: 8.1 mg/dL — AB (ref 8.9–10.3)
CO2: 25 mmol/L (ref 22–32)
CREATININE: 0.78 mg/dL (ref 0.44–1.00)
Chloride: 105 mmol/L (ref 101–111)
Glucose, Bld: 86 mg/dL (ref 65–99)
Potassium: 3.8 mmol/L (ref 3.5–5.1)
SODIUM: 136 mmol/L (ref 135–145)

## 2017-05-21 LAB — CBC
HEMATOCRIT: 24.8 % — AB (ref 35.0–47.0)
HEMOGLOBIN: 7.9 g/dL — AB (ref 12.0–16.0)
MCH: 24.1 pg — ABNORMAL LOW (ref 26.0–34.0)
MCHC: 31.6 g/dL — AB (ref 32.0–36.0)
MCV: 76.2 fL — ABNORMAL LOW (ref 80.0–100.0)
Platelets: 258 10*3/uL (ref 150–440)
RBC: 3.26 MIL/uL — ABNORMAL LOW (ref 3.80–5.20)
RDW: 19.4 % — AB (ref 11.5–14.5)
WBC: 11.3 10*3/uL — AB (ref 3.6–11.0)

## 2017-05-21 LAB — HEPARIN LEVEL (UNFRACTIONATED): HEPARIN UNFRACTIONATED: 0.54 [IU]/mL (ref 0.30–0.70)

## 2017-05-21 LAB — ECHOCARDIOGRAM COMPLETE
HEIGHTINCHES: 62 in
Weight: 2816 oz

## 2017-05-21 MED ORDER — ENOXAPARIN SODIUM 40 MG/0.4ML ~~LOC~~ SOLN
40.0000 mg | SUBCUTANEOUS | Status: DC
  Administered 2017-05-21 – 2017-05-22 (×2): 40 mg via SUBCUTANEOUS
  Filled 2017-05-21 (×2): qty 0.4

## 2017-05-21 NOTE — Progress Notes (Signed)
ANTICOAGULATION CONSULT NOTE - Initial Consult  Pharmacy Consult for heparin Indication: upper extremity subclavian DVT  No Known Allergies  Patient Measurements: Height: 5\' 2"  (157.5 cm) Weight: 176 lb (79.8 kg) IBW/kg (Calculated) : 50.1 Heparin Dosing Weight: 80.7 kg  Vital Signs: Temp: 97.9 F (36.6 C) (10/12 0351) Temp Source: Oral (10/12 0351) BP: 116/59 (10/12 0351) Pulse Rate: 68 (10/12 0351)  Labs:  Recent Labs  05/19/17 1933 05/20/17 0257 05/20/17 0738 05/20/17 1622 05/21/17 0446  HGB 8.7* 8.6*  --   --  7.9*  HCT 27.7* 27.6*  --   --  24.8*  PLT 281 280  --   --  258  APTT 37*  --   --   --   --   LABPROT 14.8  --   --   --   --   INR 1.17  --   --   --   --   HEPARINUNFRC  --   --  0.55 0.45 0.54  CREATININE 1.00 0.93  --   --  0.78  TROPONINI 0.05*  --   --   --   --     Estimated Creatinine Clearance: 49.4 mL/min (by C-G formula based on SCr of 0.78 mg/dL).   Medical History: Past Medical History:  Diagnosis Date  . Anemia   . Anxiety 10/02/2012  . Atrial fibrillation ([place]) 11/13   new onset 11/13  . BCC (basal cell carcinoma), face 04/24/2016  . Benign essential hypertension 03/13/2015  . Cellulitis of right upper extremity 01/27/2017  . Constipation 01/13/2015  . Decreased hearing 05/21/2015  . Health care maintenance 10/14/2014   Mammogram 10/01/15 - Birads I.    . Hypercholesterolemia   . Hyperlipidemia, mixed 02/01/2017  . Hypertension   . Osteoarthritis    hands, feet  . Sick sinus syndrome ([place]) 01/04/2017   Overview:  Sp pacer placement 2018  . Syncope 12/08/2016    Medications:  Scheduled:  . amiodarone  200 mg Oral BID  . feeding supplement (PRO-STAT SUGAR FREE 64)  30 mL Oral BID BM  . FLUoxetine  40 mg Oral Daily  . furosemide  40 mg Intravenous Q12H  . metoprolol succinate  50 mg Oral Daily  . potassium chloride  40 mEq Oral Daily  . torsemide  5 mg Oral Daily    Assessment: Patient is an 83 y/o female admitted for leg  swelling and upper extremity edema was found to have: 1. Extensive occlusive DVT extending from the left subclavian through one of the paired left brachial veins. 2. Extensive occlusive superficial thrombophlebitis involving the left cephalic and basilic veins. Patient is being started on heparin for lower and upper extremity DVT.  Goal of Therapy:  Heparin level 0.3-0.7 units/ml Monitor platelets by anticoagulation protocol: Yes   Plan:  Give 5000 units bolus x 1  Will start rate at 1200 units/hr and will check HL @ 0800. Baseline labs ordered Will adjust rate per HLs and monitor CBCs daily while on heparin.  10/11: HL @ 16:22 = 0.45 Will continue this pt on current rate and recheck HL on 10/12 with AM labs.   10/12 @ 0446 HL 0.54 therapeutic. Will continue current rate and will recheck w/ am labs.  Tobie Lords, PharmD, BCPS Clinical Pharmacist 05/21/2017

## 2017-05-21 NOTE — Progress Notes (Signed)
[facility]  Daily Progress Note   Subjective  - * No surgery found *  Patient denies shortness of breath no chest pains.  She denies left arm pain.  Family feels the swelling is unchanged  Objective Vitals:   05/21/17 0351 05/21/17 0805 05/21/17 1202 05/21/17 1942  BP: (!) 116/59 (!) 131/57 (!) 107/48 (!) 110/52  Pulse: 68 80 76 67  Resp: 17 18 18 18   Temp: 97.9 F (36.6 C)   (!) 97.5 F (36.4 C)  TempSrc: Oral   Oral  SpO2: 97% 98% 99% 98%  Weight: 79.8 kg (176 lb)     Height:        Intake/Output Summary (Last 24 hours) at 05/21/17 2003 Last data filed at 05/21/17 1916  Gross per 24 hour  Intake             1164 ml  Output             2150 ml  Net             -986 ml    PULM  Normal effort , no use of accessory muscles CV  No JVD, RRR Abd      No distended, nontender VASC  left arm with 3+ edema pitting  Laboratory CBC    Component Value Date/Time   WBC 11.3 (H) 05/21/2017 0446   HGB 7.9 (L) 05/21/2017 0446   HGB 13.3 08/29/2012 2339   HCT 24.8 (L) 05/21/2017 0446   HCT 39.2 08/29/2012 2339   PLT 258 05/21/2017 0446   PLT 191 08/29/2012 2339    BMET    Component Value Date/Time   NA 136 05/21/2017 0446   NA 140 08/29/2012 2339   K 3.8 05/21/2017 0446   K 3.8 08/29/2012 2339   CL 105 05/21/2017 0446   CL 107 08/29/2012 2339   CO2 25 05/21/2017 0446   CO2 29 08/29/2012 2339   GLUCOSE 86 05/21/2017 0446   GLUCOSE 97 08/29/2012 2339   BUN 25 (H) 05/21/2017 0446   BUN 21 (H) 08/29/2012 2339   CREATININE 0.78 05/21/2017 0446   CREATININE 0.56 (L) 08/29/2012 2339   CALCIUM 8.1 (L) 05/21/2017 0446   CALCIUM 9.4 08/29/2012 2339   GFRNONAA >60 05/21/2017 0446   GFRNONAA >60 08/29/2012 2339   GFRAA >60 05/21/2017 0446   GFRAA >60 08/29/2012 2339    Assessment/Planning:   DVT left arm secondary to 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  I have discussed with cardiology the possibility of  anticoagulation.  It is my understanding that from their standpoint Regarding her atrial fibrillation it would be beneficial to be on Eliquis.  Given this finding that would also be adequate treatment for her DVT.  I have also explained to the family that should she be deemed too high risk for anticoagulation that conservative therapies for her arm would also be adequate.  There is negligible chance for embolization from her arm given the fact that the DVT is distal to a venous stricture.  Furthermore, my review of the CT scan does not confirm the pulmonary emboli described by radiology and therefore given her lack of symptoms consistent with pulmonary emboli and my findings on CT I do not feel compelled to anticoagulate based on this diagnosis.  I have asked that she follow-up with me in the office after discharge    Hortencia Pilar  05/21/2017, 8:03 PM

## 2017-05-21 NOTE — Progress Notes (Signed)
[facility] [place] NAME: Kristin Avila    MR#:  [id number]  DATE OF BIRTH:  03-18-1928  SUBJECTIVE:  CHIEF COMPLAINT:  Patient denies any chest pain or shortness of breath is reporting swelling of the left upper extremity   REVIEW OF SYSTEMS:  CONSTITUTIONAL: No fever, fatigue or weakness.  EYES: No blurred or double vision.  EARS, NOSE, AND THROAT: No tinnitus or ear pain.  RESPIRATORY: No cough, shortness of breath, wheezing or hemoptysis.  CARDIOVASCULAR: No chest pain, orthopnea, edema.  GASTROINTESTINAL: No nausea, vomiting, diarrhea or abdominal pain.  GENITOURINARY: No dysuria, hematuria.  ENDOCRINE: No polyuria, nocturia,  HEMATOLOGY: No anemia, easy bruising or bleeding SKIN: No rash or lesion. MUSCULOSKELETAL: No joint pain or arthritis.  Left upper extremity is swollen NEUROLOGIC: No tingling, numbness, weakness.  PSYCHIATRY: No anxiety or depression.   DRUG ALLERGIES:  No Known Allergies  VITALS:  Blood pressure (!) 107/48, pulse 76, temperature 97.9 F (36.6 C), temperature source Oral, resp. rate 18, height 5\' 2"  (1.575 m), weight 79.8 kg (176 lb), SpO2 99 %.  PHYSICAL EXAMINATION:  GENERAL:  83 y.o.-year-old patient lying in the bed with no acute distress.  EYES: Pupils equal, round, reactive to light and accommodation. No scleral icterus. Extraocular muscles intact.  HEENT: Head atraumatic, normocephalic. Oropharynx and nasopharynx clear.  NECK:  Supple, no jugular venous distention. No thyroid enlargement, no tenderness.  LUNGS: Normal breath sounds bilaterally, no wheezing, rales,rhonchi or crepitation. No use of accessory muscles of respiration.  CARDIOVASCULAR: S1, S2 normal. No murmurs, rubs, or gallops.  ABDOMEN: Soft, nontender, nondistended. Bowel sounds present. No organomegaly or mass.  EXTREMITIES: Left upper extremity edematous and tender. No pedal edema, cyanosis, or clubbing.  NEUROLOGIC:  Cranial nerves II through XII are intact. Muscle strength 5/5 in all extremities. Sensation intact. Gait not checked.  PSYCHIATRIC: The patient is alert and oriented x 3.  SKIN: No obvious rash, lesion, or ulcer.    LABORATORY PANEL:   CBC  Recent Labs Lab 05/21/17 0446  WBC 11.3*  HGB 7.9*  HCT 24.8*  PLT 258   ------------------------------------------------------------------------------------------------------------------  Chemistries   Recent Labs Lab 05/19/17 1933  05/20/17 1622 05/21/17 0446  NA 133*  < >  --  136  K 3.6  < >  --  3.8  CL 99*  < >  --  105  CO2 26  < >  --  25  GLUCOSE 134*  < >  --  86  BUN 27*  < >  --  25*  CREATININE 1.00  < >  --  0.78  CALCIUM 8.8*  < >  --  8.1*  MG  --   --  1.7  --   AST 19  --   --   --   ALT 17  --   --   --   ALKPHOS 107  --   --   --   BILITOT 1.0  --   --   --   < > = values in this interval not displayed. ------------------------------------------------------------------------------------------------------------------  Cardiac Enzymes  Recent Labs Lab 05/19/17 1933  TROPONINI 0.05*   ------------------------------------------------------------------------------------------------------------------  RADIOLOGY:  Dg Chest 2 View  Result Date: 05/19/2017 CLINICAL DATA:  Shortness of Breath.  Lower extremity swelling. EXAM: CHEST  2 VIEW COMPARISON:  04/13/2017 FINDINGS: Left pacer remains in place, unchanged. Cardiomegaly. Small bilateral pleural effusions. Continued right lower lung airspace disease, likely pneumonia, slightly improved.  Left base atelectasis. IMPRESSION: Continued right lower lobe airspace disease, likely pneumonia with right effusion, slightly improved. Small left effusion. Left base atelectasis. Electronically Signed   By: Rolm Baptise M.D.   On: 05/19/2017 20:15   Ct Angio Chest Pe W Or Wo Contrast  Result Date: 05/20/2017 CLINICAL DATA:  Acute onset of shortness of breath and bilateral  lower extremity swelling. Tiredness. Initial encounter. EXAM: CT ANGIOGRAPHY CHEST WITH CONTRAST TECHNIQUE: Multidetector CT imaging of the chest was performed using the standard protocol during bolus administration of intravenous contrast. Multiplanar CT image reconstructions and MIPs were obtained to evaluate the vascular anatomy. CONTRAST:  75 mL of Isovue 370 IV contrast COMPARISON:  Chest radiograph performed 05/19/2017 FINDINGS: Cardiovascular: Pulmonary embolus is noted within segmental and subsegmental branches to the right middle and lower lobes. The RV/LV ratio is 1.2, which may reflect right heart strain or some degree of underlying chronic right heart failure, given diffuse enlargement of the right side of the heart. The heart is enlarged. A pacemaker is noted at the left chest wall, with leads ending at the right atrium and right ventricle. Scattered calcification is noted along the aortic arch and proximal great vessels. Mediastinum/Nodes: The mediastinum is otherwise unremarkable. No mediastinal lymphadenopathy is seen. No significant pericardial effusion is seen. The visualized portions of the thyroid gland are grossly unremarkable. No axillary lymphadenopathy is appreciated. Vague edema is noted at the left axilla and extending inferiorly along the left chest wall, of uncertain significance. Lungs/Pleura: Small bilateral pleural effusions are noted, with underlying interstitial prominence and partial consolidation of the lower lobes bilaterally. This may reflect some degree of underlying interstitial edema. Mild peripheral opacities are noted within the left upper lobe, of uncertain significance. Would correlate for evidence of infection. No pneumothorax is seen. No dominant mass is identified. Fluid tracks along the right major fissure. Upper Abdomen: The visualized portions of the liver are unremarkable. There is reflux of contrast into the hepatic veins and IVC. Hypoattenuation along the  periphery of the spleen may reflect remote traumatic injury. Musculoskeletal: No acute osseous abnormalities are identified. Degenerative change is noted at the left glenohumeral joint, with mild bony remodeling at the left humeral head. Overlying osseous fragments may reflect chronic changes of calcific tendinitis. The visualized musculature is unremarkable in appearance. Review of the MIP images confirms the above findings. IMPRESSION: 1. Pulmonary embolus within segmental and subsegmental branches to the right middle and lower lung lobes. RV/LV ratio of 1.2 may reflect right heart strain or some degree of underlying chronic right heart failure, given diffuse enlargement of the right side of the heart. Right heart strain would be consistent with at least submassive (intermediate risk) PE. The presence of right heart strain has been associated with an increased risk of morbidity and mortality. 2. Small bilateral pleural effusions, with underlying interstitial prominence and partial consolidation of the lower lobes bilaterally. This may reflect some degree of underlying interstitial edema. Mild peripheral opacities at the left upper lobe are of uncertain significance. Would correlate for evidence of infection. 3. Osseous fragments overlying the left glenohumeral joint may reflect changes of calcific tendinitis. Underlying degenerative change at the left glenohumeral joint, with mild bony remodeling at the left humeral head. 4. Hypoattenuation along the periphery of the spleen may reflect remote traumatic injury. Critical Value/emergent results were called by telephone at the time of interpretation on 05/20/2017 at 2:54 am to [facility] [place], who verbally acknowledged these results. Electronically Signed  By: Garald Balding M.D.   On: 05/20/2017 02:57   US Venous Img Upper Uni Left  Result Date: 05/19/2017 CLINICAL DATA:  Shortness of breath. Left upper extremity pain and edema  for the past 2 days. History of malignancy. EXAM: LEFT UPPER EXTREMITY VENOUS DOPPLER ULTRASOUND TECHNIQUE: Gray-scale sonography with graded compression, as well as color Doppler and duplex ultrasound were performed to evaluate the upper extremity deep venous system from the level of the subclavian vein and including the jugular, axillary, basilic, radial, ulnar and upper cephalic vein. Spectral Doppler was utilized to evaluate flow at rest and with distal augmentation maneuvers. COMPARISON:  None. FINDINGS: Contralateral Subclavian Vein: Respiratory phasicity is normal and symmetric with the symptomatic side. No evidence of thrombus. Normal compressibility. Internal Jugular Vein: No evidence of thrombus. Normal compressibility, respiratory phasicity and response to augmentation. There is mixed echogenic occlusive thrombus extending from the left subclavian (image 11), through the left axillary (image 17) extending to involve 1 of the paired brachial veins (image 35). The additional paired brachial vein appears patent. The radioulnar veins appear patent where imaged. There is occlusive superficial thrombophlebitis within the left cephalic (image 20) and basilic vein (image 25). Other Findings:  None visualized. IMPRESSION: 1. Extensive occlusive DVT extending from the left subclavian through one of the paired left brachial veins. 2. Extensive occlusive superficial thrombophlebitis involving the left cephalic and basilic veins. Electronically Signed   By: Sandi Mariscal M.D.   On: 05/19/2017 21:05    EKG:   Orders placed or performed during the hospital encounter of 05/19/17  . ED EKG  . ED EKG    ASSESSMENT AND PLAN:    . Left upper extremity DVT (deep venous thrombosis) (HCC)And pulmonary embolism   -Heparin drip  D/CED  -CT chest with PE, with right heart strain,But vascular surgery Dr. Delana Meyer has personally reviewed CT scan and do not appreciate this emboli. Left arm swelling in stricture or  stenosis of the left subclavian vein secondary to pacemaker wires which is a common occurrence recommending conservative therapy and anticoagulation would not be contraindicated given the other comorbidities atrial fibrillation. echocardiogram -tomy  . Community acquired pneumonia -Blood cultures ordered -Continue cefepime and vancomycin -Oxygen, doing nebs, respiratory to evaluate and treat  . Atrial fibrillation (HCC) -Stable, heparin drip will be continued for now -Patient was previously on anticoagulation , on eliquis, held following spontaneous bullous rupture and right thoracentesis for hemothorax. Recommending to resume eliquis following evaluation for ongoing anemia.  GI consult placed -Patient was seen by [facility] cardiology consult the same  Anemia -  GI consult placed regarding anemia; will consider restarting eliquis following GI evaluation if clinically patient is stable   . Benign essential hypertension -Stable, home medications resumed  . Hypokalemia-replete and recheck in a.m.  Marland Kitchen Hyperlipidemia, mixed -Stable, home medications resumed     All the records are reviewed and case discussed with Care Management/Social Workerr. Management plans discussed with the patient, son and they are in agreement.  CODE STATUS: fc   TOTAL TIME TAKING CARE OF THIS PATIENT: 36  minutes.   POSSIBLE D/C IN 2  DAYS, DEPENDING ON CLINICAL CONDITION.  Note: This dictation was prepared with Dragon dictation along with smaller phrase technology. Any transcriptional errors that result from this process are unintentional.   Nicholes Mango M.D on 05/21/2017 at 5:19 PM  Between 7am to 6pm - Pager - [phone] After 6pm go to [email or web address] - password Child psychotherapist Hospitalists  Office  [phone]  CC: Primary care physician; Einar Pheasant, MD

## 2017-05-21 NOTE — Progress Notes (Signed)
Pharmacy Antibiotic Note  Kristin Avila is a 83 y.o. female admitted on 05/19/2017 with pneumonia.  Pharmacy has been consulted for vanc/cefepime dosing.  Plan: Will continue vanc 1.25g IV q24h Will draw vanc trough 10/14 @ 0300 prior to 4th dose. Will continue cefepime 2g IV q12h per CrCl 30 - 60 ml/min  Ke 0.0373 T1/2 18 ~ 24 hrs  Goal trough 15 - 20 mcg/mL  Height: 5\' 2"  (157.5 cm) Weight: 176 lb (79.8 kg) IBW/kg (Calculated) : 50.1  Temp (24hrs), Avg:97.8 F (36.6 C), Min:97.7 F (36.5 C), Max:97.9 F (36.6 C)   Recent Labs Lab 05/19/17 1440 05/19/17 1933 05/20/17 0257 05/21/17 0446  WBC 16.6* 17.1* 16.1* 11.3*  CREATININE 0.97 1.00 0.93 0.78    Estimated Creatinine Clearance: 49.4 mL/min (by C-G formula based on SCr of 0.78 mg/dL).    No Known Allergies  Thank you for allowing pharmacy to be a part of this patient's care.  [place] Resident  05/21/2017

## 2017-05-21 NOTE — Progress Notes (Signed)
PT Cancellation Note  Patient Details Name: Kristin Avila MRN: [id number] DOB: 21-Mar-1928   Cancelled Treatment:    Reason Eval/Treat Not Completed: Medical issues which prohibited therapy Pt with possible PEs, will have had 48 hrs of anticoagulants tomorrow AM.  Will hold PT until that time.  Kreg Shropshire, DPT 05/21/2017, 4:22 PM

## 2017-05-21 NOTE — NC FL2 (Signed)
[place] LEVEL OF CARE SCREENING TOOL     IDENTIFICATION  Patient Name: Kristin Avila Birthdate: 03/31/1928 Sex: female Admission Date (Current Location): 05/19/2017  [place] and [place] Number:  Engineering geologist and Address:  [facility], [address] [place], [place] [address]      Provider Number: [id number]  Attending Physician Name and Address:  Nicholes Mango, MD  Relative Name and Phone Number:  Denicola,Carol Daughter [phone]  [phone] or Shawndell, Varas [phone] or Clotine, Heiner [phone]     Current Level of Care: Hospital Recommended Level of Care: [place] Prior Approval Number:    Date Approved/Denied:   PASRR Number: [id number]  Discharge Plan: SNF    Current Diagnoses: Patient Active Problem List   Diagnosis Date Noted  . DVT (deep venous thrombosis) ([place]) 05/20/2017  . Community acquired pneumonia 05/20/2017  . Encephalopathy acute 04/29/2017  . Pleural effusion on right 04/18/2017  . Hypotension 04/18/2017  . Hyperlipidemia, mixed 02/01/2017  . Cellulitis of right upper extremity 01/27/2017  . Sick sinus syndrome ([place]) 01/04/2017  . Syncope 12/08/2016  . BCC (basal cell carcinoma), face 04/24/2016  . Decreased hearing 05/21/2015  . Benign essential hypertension 03/13/2015  . Constipation 01/13/2015  . Health care maintenance 10/14/2014  . Anxiety 10/02/2012  . Atrial fibrillation ([place]) 07/09/2012  . Osteoarthritis 07/08/2012  . Hypercholesterolemia 07/08/2012  . Hypertension 07/08/2012    Orientation RESPIRATION BLADDER Height & Weight     Self, Time, Situation, Place  Normal Continent Weight: 176 lb (79.8 kg) Height:  5\' 2"  (157.5 cm)  BEHAVIORAL SYMPTOMS/MOOD NEUROLOGICAL BOWEL NUTRITION STATUS      Continent Diet  AMBULATORY STATUS COMMUNICATION OF NEEDS Skin   Limited Assist Verbally Normal                       Personal Care Assistance  Level of Assistance  Bathing, Feeding, Dressing, Total care Bathing Assistance: Limited assistance Feeding assistance: Limited assistance Dressing Assistance: Limited assistance Total Care Assistance: Limited assistance   Functional Limitations Info  Sight, Hearing, Speech Sight Info: Adequate Hearing Info: Impaired Speech Info: Adequate    SPECIAL CARE FACTORS FREQUENCY  PT (By licensed PT), OT (By licensed OT)     PT Frequency: 2x a week OT Frequency: 2x a week            Contractures Contractures Info: Not present    Additional Factors Info  Code Status, Allergies, Psychotropic Code Status Info: Full Code Allergies Info: NKA Psychotropic Info: FLUoxetine (PROZAC) capsule 40 mg         Current Medications (05/21/2017):  This is the current hospital active medication list Current Facility-Administered Medications  Medication Dose Route Frequency Provider Last Rate Last Dose  . acetaminophen (TYLENOL) tablet 650 mg  650 mg Oral Q6H PRN Quintella Baton, MD       Or  . acetaminophen (TYLENOL) suppository 650 mg  650 mg Rectal Q6H PRN Crosley, Debby, MD      . albuterol (PROVENTIL) (2.5 MG/3ML) 0.083% nebulizer solution 2.5 mg  2.5 mg Nebulization Q2H PRN Crosley, Debby, MD      . amiodarone (PACERONE) tablet 200 mg  200 mg Oral BID Claria Dice, Debby, MD   200 mg at 05/21/17 0915  . bisacodyl (DULCOLAX) EC tablet 5 mg  5 mg Oral Daily PRN Crosley, Debby, MD      . ceFEPIme (MAXIPIME) 2 g in dextrose 5 % 50 mL IVPB  2 g Intravenous Q12H Orbie Pyo, MD   Stopped at 05/21/17 1230  . enoxaparin (LOVENOX) injection 40 mg  40 mg Subcutaneous Q24H Gouru, Aruna, MD   40 mg at 05/21/17 1738  . feeding supplement (PRO-STAT SUGAR FREE 64) liquid 30 mL  30 mL Oral BID BM Crosley, Debby, MD   30 mL at 05/20/17 1438  . FLUoxetine (PROZAC) capsule 40 mg  40 mg Oral Daily Crosley, Debby, MD   40 mg at 05/21/17 0915  . furosemide (LASIX) injection 40 mg  40 mg Intravenous Q12H  Crosley, Debby, MD   40 mg at 05/21/17 1734  . HYDROcodone-acetaminophen (NORCO/VICODIN) 5-325 MG per tablet 1-2 tablet  1-2 tablet Oral Q6H PRN Crosley, Debby, MD      . metoprolol succinate (TOPROL-XL) 24 hr tablet 50 mg  50 mg Oral Daily Crosley, Debby, MD   50 mg at 05/21/17 0915  . ondansetron (ZOFRAN) tablet 4 mg  4 mg Oral Q6H PRN Crosley, Debby, MD       Or  . ondansetron (ZOFRAN) injection 4 mg  4 mg Intravenous Q6H PRN Crosley, Debby, MD      . polyethylene glycol (MIRALAX / GLYCOLAX) packet 17 g  17 g Oral Daily PRN Crosley, Debby, MD      . potassium chloride SA (K-DUR,KLOR-CON) CR tablet 40 mEq  40 mEq Oral Daily Candelaria Stagers, RPH   40 mEq at 05/21/17 0915  . senna-docusate (Senokot-S) tablet 1 tablet  1 tablet Oral QHS PRN Crosley, Debby, MD      . torsemide (DEMADEX) tablet 5 mg  5 mg Oral Daily Crosley, Debby, MD   5 mg at 05/21/17 0915  . traZODone (DESYREL) tablet 50 mg  50 mg Oral QHS PRN Quintella Baton, MD   50 mg at 05/20/17 2137  . vancomycin (VANCOCIN) 1,250 mg in sodium chloride 0.9 % 250 mL IVPB  1,250 mg Intravenous Q24H Orbie Pyo, MD   Stopped at 05/21/17 [phone]     Discharge Medications: Please see discharge summary for a list of discharge medications.  Relevant Imaging Results:  Relevant Lab Results:   Additional Information SSN [id number]   Ross Ludwig, [place]

## 2017-05-21 NOTE — Progress Notes (Signed)
*  PRELIMINARY RESULTS* Echocardiogram 2D Echocardiogram has been performed.  Kristin Avila 05/21/2017, 8:48 AM

## 2017-05-21 NOTE — Care Management (Addendum)
CSW confirmed that patient did present from [facility]  but was to have discharged home from the facility today.  There are no concerns from DSS regarding patient returning to the home environment.  Patient informed CM "I want to go home."  She has also ruled in for pulmonary pulmonary embolus with right heart strain  in addition to the DVT in her left upper arm.  Physical therapy consult pending. Vascular, cardiology and gi are consulting. Has been placed on lovenox bid today.  Has had home health through [facility] in the past

## 2017-05-21 NOTE — Clinical Social Work Note (Signed)
CSW received phone call from [place] at [place] [phone] that patient was from a facility.  APS has been working with patient's daughter Arbie Cookey who is the POA.  CSW contacted [facility] SNF who said patient was at [facility] for short term rehab before she came to hospital.  According to [facility] patient was due to discharge to home with home health yesterday but unfortunately had to come to hospital.  According to patient's daughter she would like her to go home with home health if possible, because she is in copay days, but if patient needs to return to SNF they will consider it.  Jones Broom. Norval Morton, MSW, [place]  05/21/2017 6:39 PM

## 2017-05-21 NOTE — Clinical Social Work Note (Signed)
Clinical Social Work Assessment  Patient Details  Name: Kristin Avila MRN: [id number] Date of Birth: 06/21/1928  Date of referral:  05/21/17               Reason for consult:  Facility Placement                Permission sought to share information with:  Family Supports Permission granted to share information::  Yes, Verbal Permission Granted  Name::     Denicola,Carol Daughter [phone]  [phone] or Yoko, Mcgahee [phone] or Alazay, Leicht [phone]  [phone]   Agency::  SNF admissions  Relationship::     Contact Information:     Housing/Transportation Living arrangements for the past 2 months:  [place], [place] of Information:  Patient, Power of [place], Adult Children Patient Interpreter Needed:  None Criminal Activity/Legal Involvement Pertinent to Current Situation/Hospitalization:  No - Comment as needed Significant Relationships:  Adult Children, Other Family Members Lives with:  Facility Resident, Adult Children Do you feel safe going back to the place where you live?  Yes Need for family participation in patient care:  Yes (Comment) (Patient has some dementia)  Care giving concerns:  Patient's family would like her to discharge home with home health if it is appropriate, but they are okay with patient returning for a few days to [facility] if needed.   Social Worker assessment / plan:  Patient is an 83 year old female who is alert and oriented x4.  Patient is hard of hearing, assessment was completed by speaking with daughter who was at bedside.  Patient has been at [facility] for about 3 weeks, and she was supposed to be discharging back home yesterday, however she ended up having to return to the hospital.  Patient's daughter reports that patient has been doing well at SNF, and had progressed with therapy.  Patient's daughter expressed they have been pleased with the care that was being provided.   Patient's daughter stated that patient is in her copay days now, and it would cost $50 a day if patient was to return back to SNF.  Patient's daughter stated she would rather have patient return back home if possible.  Patient's daughter was pleasant and talkative patient did not really want to speak very much but she was smiling.  APS is following patient as well, Lala Lund is the APS worker, she can be reached at [phone] to updated her on what discharge plan is.  Employment status:  Retired Nurse, adult PT Recommendations:  Not assessed at this time Information / Referral to community resources:  [place]  Patient/Family's Response to care: Patient's family would like her to return back home, but patient's family is okay with having her return to SNF it is recommended by PT and OT.  Patient/Family's Understanding of and Emotional Response to Diagnosis, Current Treatment, and Prognosis:  Patient's family are in agreement to having patient either return back home or to SNF. Emotional Assessment Appearance:  Appears stated age Attitude/Demeanor/Rapport:    Affect (typically observed):  Appropriate, Calm, Stable Orientation:  Oriented to Self, Oriented to Place, Oriented to  Time, Oriented to Situation Alcohol / Substance use:  Not Applicable Psych involvement (Current and /or in the community):  No (Comment)  Discharge Needs  Concerns to be addressed:  Care Coordination Readmission within the last 30 days:  Yes (04-28-17 to [facility]) Current discharge risk:  None Barriers to  Discharge:  Continued Medical Work up   Anell Barr 05/21/2017, 6:49 PM

## 2017-05-21 NOTE — Progress Notes (Signed)
[facility]  SUBJECTIVE: Patient appears to be clinically stable, denies chest pain or shortness of breath   Vitals:   05/20/17 1953 05/21/17 0351 05/21/17 0805 05/21/17 1202  BP: (!) 95/57 (!) 116/59 (!) 131/57 (!) 107/48  Pulse: 67 68 80 76  Resp:  17 18 18   Temp:  97.9 F (36.6 C)    TempSrc:  Oral    SpO2:  97% 98% 99%  Weight:  79.8 kg (176 lb)    Height:         Intake/Output Summary (Last 24 hours) at 05/21/17 1332 Last data filed at 05/21/17 1048  Gross per 24 hour  Intake             1044 ml  Output              900 ml  Net              144 ml      PHYSICAL EXAM  General: Well developed, well nourished, in no acute distress HEENT:  Normocephalic and atramatic Neck:  No JVD.  Lungs: Clear bilaterally to auscultation and percussion. Heart: HRRR . Normal S1 and S2 without gallops or murmurs.  Abdomen: Bowel sounds are positive, abdomen soft and non-tender  Msk:  Back normal, normal gait. Normal strength and tone for age. Extremities: No clubbing, cyanosis or edema.   Neuro: Alert and oriented X 3. Psych:  Good affect, responds appropriately   LABS: Basic Metabolic Panel:  Recent Labs  05/20/17 0257 05/20/17 1622 05/21/17 0446  NA 134*  --  136  K 3.3*  --  3.8  CL 100*  --  105  CO2 24  --  25  GLUCOSE 103*  --  86  BUN 24*  --  25*  CREATININE 0.93  --  0.78  CALCIUM 8.2*  --  8.1*  MG  --  1.7  --    Liver Function Tests:  Recent Labs  05/19/17 1440 05/19/17 1933  AST 21 19  ALT 16 17  ALKPHOS 96 107  BILITOT 0.9 1.0  PROT 6.0* 6.6  ALBUMIN 2.7* 2.9*   No results for input(s): LIPASE, AMYLASE in the last 72 hours. CBC:  Recent Labs  05/19/17 1440 05/19/17 1933 05/20/17 0257 05/21/17 0446  WBC 16.6* 17.1* 16.1* 11.3*  NEUTROABS 13.8* 14.0*  --   --   HGB 8.8* 8.7* 8.6* 7.9*  HCT 28.4* 27.7* 27.6* 24.8*  MCV 77.4* 76.5* 76.3* 76.2*  PLT 264 281 280 258   Cardiac Enzymes:  Recent Labs  05/19/17 1933  TROPONINI 0.05*    BNP: Invalid input(s): POCBNP D-Dimer: No results for input(s): DDIMER in the last 72 hours. Hemoglobin A1C: No results for input(s): HGBA1C in the last 72 hours. Fasting Lipid Panel: No results for input(s): CHOL, HDL, LDLCALC, TRIG, CHOLHDL, LDLDIRECT in the last 72 hours. Thyroid Function Tests: No results for input(s): TSH, T4TOTAL, T3FREE, THYROIDAB in the last 72 hours.  Invalid input(s): FREET3 Anemia Panel: No results for input(s): VITAMINB12, FOLATE, FERRITIN, TIBC, IRON, RETICCTPCT in the last 72 hours.  Dg Chest 2 View  Result Date: 05/19/2017 CLINICAL DATA:  Shortness of Breath.  Lower extremity swelling. EXAM: CHEST  2 VIEW COMPARISON:  04/13/2017 FINDINGS: Left pacer remains in place, unchanged. Cardiomegaly. Small bilateral pleural effusions. Continued right lower lung airspace disease, likely pneumonia, slightly improved. Left base atelectasis. IMPRESSION: Continued right lower lobe airspace disease, likely pneumonia with right effusion, slightly improved. Small left effusion.  Left base atelectasis. Electronically Signed   By: Rolm Baptise M.D.   On: 05/19/2017 20:15   Ct Angio Chest Pe W Or Wo Contrast  Result Date: 05/20/2017 CLINICAL DATA:  Acute onset of shortness of breath and bilateral lower extremity swelling. Tiredness. Initial encounter. EXAM: CT ANGIOGRAPHY CHEST WITH CONTRAST TECHNIQUE: Multidetector CT imaging of the chest was performed using the standard protocol during bolus administration of intravenous contrast. Multiplanar CT image reconstructions and MIPs were obtained to evaluate the vascular anatomy. CONTRAST:  75 mL of Isovue 370 IV contrast COMPARISON:  Chest radiograph performed 05/19/2017 FINDINGS: Cardiovascular: Pulmonary embolus is noted within segmental and subsegmental branches to the right middle and lower lobes. The RV/LV ratio is 1.2, which may reflect right heart strain or some degree of underlying chronic right heart failure, given diffuse  enlargement of the right side of the heart. The heart is enlarged. A pacemaker is noted at the left chest wall, with leads ending at the right atrium and right ventricle. Scattered calcification is noted along the aortic arch and proximal great vessels. Mediastinum/Nodes: The mediastinum is otherwise unremarkable. No mediastinal lymphadenopathy is seen. No significant pericardial effusion is seen. The visualized portions of the thyroid gland are grossly unremarkable. No axillary lymphadenopathy is appreciated. Vague edema is noted at the left axilla and extending inferiorly along the left chest wall, of uncertain significance. Lungs/Pleura: Small bilateral pleural effusions are noted, with underlying interstitial prominence and partial consolidation of the lower lobes bilaterally. This may reflect some degree of underlying interstitial edema. Mild peripheral opacities are noted within the left upper lobe, of uncertain significance. Would correlate for evidence of infection. No pneumothorax is seen. No dominant mass is identified. Fluid tracks along the right major fissure. Upper Abdomen: The visualized portions of the liver are unremarkable. There is reflux of contrast into the hepatic veins and IVC. Hypoattenuation along the periphery of the spleen may reflect remote traumatic injury. Musculoskeletal: No acute osseous abnormalities are identified. Degenerative change is noted at the left glenohumeral joint, with mild bony remodeling at the left humeral head. Overlying osseous fragments may reflect chronic changes of calcific tendinitis. The visualized musculature is unremarkable in appearance. Review of the MIP images confirms the above findings. IMPRESSION: 1. Pulmonary embolus within segmental and subsegmental branches to the right middle and lower lung lobes. RV/LV ratio of 1.2 may reflect right heart strain or some degree of underlying chronic right heart failure, given diffuse enlargement of the right side of  the heart. Right heart strain would be consistent with at least submassive (intermediate risk) PE. The presence of right heart strain has been associated with an increased risk of morbidity and mortality. 2. Small bilateral pleural effusions, with underlying interstitial prominence and partial consolidation of the lower lobes bilaterally. This may reflect some degree of underlying interstitial edema. Mild peripheral opacities at the left upper lobe are of uncertain significance. Would correlate for evidence of infection. 3. Osseous fragments overlying the left glenohumeral joint may reflect changes of calcific tendinitis. Underlying degenerative change at the left glenohumeral joint, with mild bony remodeling at the left humeral head. 4. Hypoattenuation along the periphery of the spleen may reflect remote traumatic injury. Critical Value/emergent results were called by telephone at the time of interpretation on 05/20/2017 at 2:54 am to [place] at [place], who verbally acknowledged these results. Electronically Signed   By: Garald Balding M.D.   On: 05/20/2017 02:57   US Venous Img Upper Uni Left  Result Date: 05/19/2017 CLINICAL DATA:  Shortness of breath. Left upper extremity pain and edema for the past 2 days. History of malignancy. EXAM: LEFT UPPER EXTREMITY VENOUS DOPPLER ULTRASOUND TECHNIQUE: Gray-scale sonography with graded compression, as well as color Doppler and duplex ultrasound were performed to evaluate the upper extremity deep venous system from the level of the subclavian vein and including the jugular, axillary, basilic, radial, ulnar and upper cephalic vein. Spectral Doppler was utilized to evaluate flow at rest and with distal augmentation maneuvers. COMPARISON:  None. FINDINGS: Contralateral Subclavian Vein: Respiratory phasicity is normal and symmetric with the symptomatic side. No evidence of thrombus. Normal compressibility. Internal Jugular Vein: No evidence  of thrombus. Normal compressibility, respiratory phasicity and response to augmentation. There is mixed echogenic occlusive thrombus extending from the left subclavian (image 11), through the left axillary (image 17) extending to involve 1 of the paired brachial veins (image 35). The additional paired brachial vein appears patent. The radioulnar veins appear patent where imaged. There is occlusive superficial thrombophlebitis within the left cephalic (image 20) and basilic vein (image 25). Other Findings:  None visualized. IMPRESSION: 1. Extensive occlusive DVT extending from the left subclavian through one of the paired left brachial veins. 2. Extensive occlusive superficial thrombophlebitis involving the left cephalic and basilic veins. Electronically Signed   By: Sandi Mariscal M.D.   On: 05/19/2017 21:05     Echo pending  TELEMETRY: A sensed V paced:  ASSESSMENT AND PLAN:  Active Problems:   Hypertension   Atrial fibrillation (HCC)   Benign essential hypertension   Hyperlipidemia, mixed   DVT (deep venous thrombosis) ([place])   Community acquired pneumonia    1. Paroxysmal A. Fib, currently dual-chamber pacing, previously on Eliquis for stroke prevention,  held following spontaneous bullous rupture and right thoracentesis for hemothorax 04/19/2017 2. Left upper extremity swelling, likely due to left subclavian vein narrowing due to pacemaker lead, evaluated by vascular and not felt to be an acute problem 3. Community acquired pneumonia 4. Anemia, hemoglobin and hematocrit 7.9 and 24.8, respectively.   Recommendations  1. DC heparin 2. Resume Eliquis following evaluation for ongoing anemia 3. Defer further cardiac diagnostics at this time   Isaias Cowman, MD, PhD, [facility] 05/21/2017 1:32 PM

## 2017-05-21 NOTE — Care Management Important Message (Signed)
Important Message  Patient Details  Name: Kristin Avila MRN: [id number] Date of Birth: 1928/05/12   Medicare Important Message Given:  Yes Signed IM notice given   Katrina Stack, RN 05/21/2017, 12:19 PM

## 2017-05-21 NOTE — Progress Notes (Signed)
PHARMACY CONSULT NOTE - INITIAL   Pharmacy Consult for electrolyte monitoring   No Known Allergies  Patient Measurements: Height: 5\' 2"  (157.5 cm) Weight: 176 lb (79.8 kg) IBW/kg (Calculated) : 50.1  Vital Signs: Temp: 97.9 F (36.6 C) (10/12 0351) Temp Source: Oral (10/12 0351) BP: 131/57 (10/12 0805) Pulse Rate: 80 (10/12 0805) Intake/Output from previous day: 10/11 0701 - 10/12 0700 In: 1284 [P.O.:840; I.V.:144; IV Piggyback:300] Out: 2650 [Urine:2650] Intake/Output from this shift: Total I/O In: 120 [P.O.:120] Out: -   Labs:  Recent Labs  05/19/17 1440 05/19/17 1933 05/20/17 0257 05/20/17 1622 05/21/17 0446  WBC 16.6* 17.1* 16.1*  --  11.3*  HGB 8.8* 8.7* 8.6*  --  7.9*  HCT 28.4* 27.7* 27.6*  --  24.8*  PLT 264 281 280  --  258  APTT  --  37*  --   --   --   CREATININE 0.97 1.00 0.93  --  0.78  MG  --   --   --  1.7  --   ALBUMIN 2.7* 2.9*  --   --   --   PROT 6.0* 6.6  --   --   --   AST 21 19  --   --   --   ALT 16 17  --   --   --   ALKPHOS 96 107  --   --   --   BILITOT 0.9 1.0  --   --   --    Lab Results  Component Value Date   K 3.8 05/21/2017    Assessment: Patient is an 83 yo female admitted for leg swelling and upper extremity edema.   Goal of Therapy:  Electrolytes WNL    Plan:  10/11 K+ 3.3 and Mg 1.7; Initiated KCl 40 mEq PO once daily.  10/12 K+ 3.8  Continue KCl 40 mEq PO once daily   Pharmacy will continue to follow and monitor electrolytes as appropriate.   Drew Resident  05/21/2017,10:58 AM

## 2017-05-22 DIAGNOSIS — D500 Iron deficiency anemia secondary to blood loss (chronic): Secondary | ICD-10-CM

## 2017-05-22 LAB — BASIC METABOLIC PANEL
Anion gap: 7 (ref 5–15)
BUN: 21 mg/dL — AB (ref 6–20)
CALCIUM: 8.7 mg/dL — AB (ref 8.9–10.3)
CO2: 27 mmol/L (ref 22–32)
Chloride: 103 mmol/L (ref 101–111)
Creatinine, Ser: 0.96 mg/dL (ref 0.44–1.00)
GFR calc Af Amer: >60 mL/min (ref >60)
GFR, EST NON AFRICAN AMERICAN: 52 mL/min — AB (ref >60)
GLUCOSE: 86 mg/dL (ref 65–99)
POTASSIUM: 4 mmol/L (ref 3.5–5.1)
SODIUM: 137 mmol/L (ref 135–145)

## 2017-05-22 LAB — CBC
HCT: 25.9 % — ABNORMAL LOW (ref 35.0–47.0)
Hemoglobin: 8.1 g/dL — ABNORMAL LOW (ref 12.0–16.0)
MCH: 23.9 pg — AB (ref 26.0–34.0)
MCHC: 31.4 g/dL — ABNORMAL LOW (ref 32.0–36.0)
MCV: 76.1 fL — AB (ref 80.0–100.0)
PLATELETS: 314 10*3/uL (ref 150–440)
RBC: 3.4 MIL/uL — AB (ref 3.80–5.20)
RDW: 19.7 % — AB (ref 11.5–14.5)
WBC: 10.1 10*3/uL (ref 3.6–11.0)

## 2017-05-22 MED ORDER — POLYETHYLENE GLYCOL 3350 17 GM/SCOOP PO POWD
1.0000 | Freq: Once | ORAL | Status: AC
  Administered 2017-05-22: 255 g via ORAL
  Filled 2017-05-22: qty 255

## 2017-05-22 NOTE — Consult Note (Signed)
Kristin Lame, MD [facility]  [address], [place], [place] [address] Phone: [phone] Fax : [phone]  Consultation  Referring Provider:     Dr. Benjie Karvonen Primary Care Physician:  Einar Pheasant, MD Primary Gastroenterologist:  Althia Forts         Reason for Consultation:     Anemia  Date of Admission:  05/19/2017 Date of Consultation:  05/22/2017         HPI:   Kristin Avila is a 83 y.o. female who was found to have a left arm DVT secondary to a venous stricture of the subclavian vein.  The patient is being considered for anticoagulation therapy but has been found to have anemia. The patient's hemoglobin in August was 13.9 with a drop in September to 11.8 and since then it has been hovering between 8.8 and 6.9. This morning the patient's hemoglobin was 8.1.  The patient denies any overt GI bleeding.  She had a colonoscopy approximately 8 years ago.  There is concern that if the patient has a bleeding source that antibiotic patient may make it worse.  The patient also has had a low MCV the last month.  The patient has not much of it historian and reports that she was admitted for shortness of breath.  She does not know what year it is but she does know she is in the hospital.  Past Medical History:  Diagnosis Date  . Anemia   . Anxiety 10/02/2012  . Atrial fibrillation ([place]) 11/13   new onset 11/13  . BCC (basal cell carcinoma), face 04/24/2016  . Benign essential hypertension 03/13/2015  . Cellulitis of right upper extremity 01/27/2017  . Constipation 01/13/2015  . Decreased hearing 05/21/2015  . Health care maintenance 10/14/2014   Mammogram 10/01/15 - [facility] I.    . Hypercholesterolemia   . Hyperlipidemia, mixed 02/01/2017  . Hypertension   . Osteoarthritis    hands, feet  . Sick sinus syndrome ([place]) 01/04/2017   Overview:  Sp pacer placement 2018  . Syncope 12/08/2016    Past Surgical History:  Procedure Laterality Date  . APPENDECTOMY  1957  . CATARACT EXTRACTION  2013     left eye  . COLONOSCOPY  09/06/2008  . INCISION AND DRAINAGE ABSCESS Right 01/29/2017   Procedure: INCISION AND DRAINAGE ABSCESS;  Surgeon: Florene Glen, MD;  Location: [facility] ORS;  Service: General;  Laterality: Right;  . KIDNEY SURGERY  age 23  . PACEMAKER INSERTION Left 12/14/2016   Procedure: INSERTION PACEMAKER;  Surgeon: Isaias Cowman, MD;  Location: [facility] ORS;  Service: Cardiovascular;  Laterality: Left;  . TUBAL LIGATION    . VIDEO ASSISTED THORACOSCOPY (VATS)/THOROCOTOMY Right 04/19/2017   Procedure: RIGHT THORACOSCOPY AND EVACUATION OF HEMOTHORAX;  Surgeon: Nestor Lewandowsky, MD;  Location: [facility] ORS;  Service: General;  Laterality: Right;    Prior to Admission medications   Medication Sig Start Date End Date Taking? Authorizing Provider  acetaminophen (TYLENOL) 325 MG tablet Take 2 tablets (650 mg total) by mouth every 6 (six) hours as needed for mild pain, moderate pain or fever (or Fever >/= 101). 12/11/16  Yes Gouru, Illene Silver, MD  albuterol (PROVENTIL) (2.5 MG/3ML) 0.083% nebulizer solution Take 3 mLs (2.5 mg total) by nebulization every 2 (two) hours as needed for wheezing. 04/28/17  Yes Gouru, Illene Silver, MD  Amino Acids-Protein Hydrolys (FEEDING SUPPLEMENT, PRO-STAT SUGAR FREE 64,) LIQD Take 30 mLs by mouth 2 (two) times daily between meals.   Yes [provider]  amiodarone (  PACERONE) 200 MG tablet Take 1 tablet (200 mg total) by mouth 2 (two) times daily. 04/28/17  Yes Gouru, Illene Silver, MD  bisacodyl (DULCOLAX) 5 MG EC tablet Take 1 tablet (5 mg total) by mouth daily as needed for moderate constipation. 04/28/17  Yes Gouru, Illene Silver, MD  cephALEXin (KEFLEX) 500 MG capsule Take 500 mg by mouth 3 (three) times daily.   Yes [provider]  Cholecalciferol (VITAMIN D-3) 1000 UNITS CAPS Take 1 capsule by mouth daily.    Yes [provider]  FLUoxetine (PROZAC) 40 MG capsule Take 1 capsule (40 mg total) by mouth daily. 01/18/17  Yes Einar Pheasant, MD   HYDROcodone-acetaminophen (NORCO/VICODIN) 5-325 MG tablet Take 1-2 tablets by mouth every 6 (six) hours as needed for moderate pain. 04/29/17  Yes Toni Arthurs, NP  lovastatin (MEVACOR) 20 MG tablet Take 1 tablet (20 mg total) by mouth daily. 01/18/17  Yes Einar Pheasant, MD  metoprolol succinate (TOPROL-XL) 50 MG 24 hr tablet TAKE (1) TABLET BY MOUTH DAILY. TAKE WITH OR IMMEDIATELY FOLLOWING A MEAL 01/19/17  Yes Einar Pheasant, MD  Nutritional Supplements (ENSURE ENLIVE PO) Take 1 Bottle by mouth 2 (two) times daily between meals.   Yes [provider]  senna-docusate (SENOKOT-S) 8.6-50 MG tablet Take 1 tablet by mouth at bedtime as needed for mild constipation. 12/11/16  Yes Gouru, Illene Silver, MD  torsemide (DEMADEX) 5 MG tablet Take 5 mg by mouth daily.   Yes [provider]  traZODone (DESYREL) 50 MG tablet Take 1 tablet (50 mg total) by mouth at bedtime as needed for sleep. 04/28/17  Yes Nicholes Mango, MD    Family History  Problem Relation Age of Onset  . Congestive Heart Failure Father   . Multiple myeloma Mother   . Hypertension Brother   . Alzheimer's disease Unknown        grandmother and great aunts  . Breast cancer Neg Hx   . Colon cancer Neg Hx      Social History  Substance Use Topics  . Smoking status: Never Smoker  . Smokeless tobacco: Never Used  . Alcohol use 0.0 oz/week     Comment: occasional    Allergies as of 05/19/2017  . (No Known Allergies)    Review of Systems:    All systems reviewed and negative except where noted in HPI.   Physical Exam:  Vital signs in last 24 hours: Temp:  [97.5 F (36.4 C)-98 F (36.7 C)] 98 F (36.7 C) (10/13 1217) Pulse Rate:  [67-68] 67 (10/13 1217) Resp:  [18] 18 (10/13 1217) BP: (108-123)/(52-60) 123/60 (10/13 1217) SpO2:  [96 %-98 %] 98 % (10/13 1217) Weight:  [175 lb 6.4 oz (79.6 kg)] 175 lb 6.4 oz (79.6 kg) (10/13 0326) Last BM Date: 05/20/17 General:   Pleasant, cooperative in NAD Head:   Normocephalic and atraumatic. Eyes:   No icterus.   Conjunctiva pink. PERRLA. Ears:  Normal auditory acuity. Neck:  Supple; no masses or thyroidomegaly Lungs: Respirations even and unlabored. Lungs clear to auscultation bilaterally.   No wheezes, crackles, or rhonchi.  Heart:  Regular rate and rhythm;  Without murmur, clicks, rubs or gallops Abdomen:  Soft, nondistended, nontender. Normal bowel sounds. No appreciable masses or hepatomegaly.  No rebound or guarding.  Rectal:  Not performed. Msk:  Symmetrical without gross deformities.    Extremities:  Without edema, cyanosis or clubbing. Neurologic:  Alert;  grossly normal neurologically. Skin:  Intact without significant lesions or rashes. Cervical Nodes:  No significant  cervical adenopathy. Psych:  Alert and cooperative. Normal affect.  LAB RESULTS:  Recent Labs  05/20/17 0257 05/21/17 0446 05/22/17 0731  WBC 16.1* 11.3* 10.1  HGB 8.6* 7.9* 8.1*  HCT 27.6* 24.8* 25.9*  PLT 280 258 314   BMET  Recent Labs  05/20/17 0257 05/21/17 0446 05/22/17 0731  NA 134* 136 137  K 3.3* 3.8 4.0  CL 100* 105 103  CO2 24 25 27   GLUCOSE 103* 86 86  BUN 24* 25* 21*  CREATININE 0.93 0.78 0.96  CALCIUM 8.2* 8.1* 8.7*   LFT  Recent Labs  05/19/17 1933  PROT 6.6  ALBUMIN 2.9*  AST 19  ALT 17  ALKPHOS 107  BILITOT 1.0   PT/INR  Recent Labs  05/19/17 1933  LABPROT 14.8  INR 1.17    STUDIES: No results found.    Impression / Plan:   Kristin Avila is a 83 y.o. y/o female who has had anemia that is concerning especially since the patient will need anticoagulation for a DVT in her upper extremity. The patient will be set up for an EGD and colonoscopy.  This will be planned for tomorrow if the patient is able to drink her prep.  If the patient is not able to finish the prep today then we may postpone the procedure tell Monday. I have discussed risks & benefits which include, but are not limited to, bleeding, infection,  perforation & drug reaction.  The patient agrees with this plan & written consent will be obtained.     Thank you for involving me in the care of this patient.      LOS: 2 days   Kristin Lame, MD  05/22/2017, 1:38 PM   Note: This dictation was prepared with Dragon dictation along with smaller phrase technology. Any transcriptional errors that result from this process are unintentional.

## 2017-05-22 NOTE — Consult Note (Signed)
[facility] Consult Note  MRN : [id number]  Kristin Avila is a 83 y.o. (09-25-1927) female who presents with chief complaint of  Chief Complaint  Patient presents with  . Leg Swelling  .  History of Present Illness: I'm asked to evaluate the patient by Dr. Margaretmary Eddy for left arm DVT associated with pulmonary embolism.  The patient is an 83 year old woman who presented to the [facility] emergency room earlier today with increasing shortness of breath that has developed over the past week. The day before her admission she noted the acute onset swelling of the left upper extremity. Family confirms this. Workup in the emergency room included a CT angiogram which has been read as right sided pulmonary emboli. Duplex ultrasound of the left arm was obtained which is reported as demonstrating DVT in the subclavian and axillary veins.  Patient has a history of atrial fibrillation. She has been on anticoagulation the past but this is been discontinued fairly recently secondary to her having multiple falls a fairly severe head/scalp hematoma and a spontaneous right pneumothorax from a rupture of a bleb.  At the time of my interview the patient denies fever chills. She denies pain in the left arm. She denies cough or hemoptysis. She has been initiated on a heparin drip which she is tolerating well.  Current Facility-Administered Medications  Medication Dose Route Frequency Provider Last Rate Last Dose  . acetaminophen (TYLENOL) tablet 650 mg  650 mg Oral Q6H PRN Quintella Baton, MD       Or  . acetaminophen (TYLENOL) suppository 650 mg  650 mg Rectal Q6H PRN Crosley, Debby, MD      . albuterol (PROVENTIL) (2.5 MG/3ML) 0.083% nebulizer solution 2.5 mg  2.5 mg Nebulization Q2H PRN Crosley, Debby, MD      . amiodarone (PACERONE) tablet 200 mg  200 mg Oral BID Crosley, Debby, MD   200 mg at 05/22/17 1014  . bisacodyl (DULCOLAX) EC tablet 5 mg  5 mg Oral Daily PRN Crosley, Debby, MD       . ceFEPIme (MAXIPIME) 2 g in dextrose 5 % 50 mL IVPB  2 g Intravenous Q12H Orbie Pyo, MD 100 mL/hr at 05/22/17 1203 2 g at 05/22/17 1203  . enoxaparin (LOVENOX) injection 40 mg  40 mg Subcutaneous Q24H Gouru, Aruna, MD   40 mg at 05/21/17 1738  . feeding supplement (PRO-STAT SUGAR FREE 64) liquid 30 mL  30 mL Oral BID BM Crosley, Debby, MD   30 mL at 05/22/17 1000  . FLUoxetine (PROZAC) capsule 40 mg  40 mg Oral Daily Crosley, Debby, MD   40 mg at 05/22/17 1014  . furosemide (LASIX) injection 40 mg  40 mg Intravenous Q12H Crosley, Debby, MD   40 mg at 05/22/17 0607  . HYDROcodone-acetaminophen (NORCO/VICODIN) 5-325 MG per tablet 1-2 tablet  1-2 tablet Oral Q6H PRN Crosley, Debby, MD      . metoprolol succinate (TOPROL-XL) 24 hr tablet 50 mg  50 mg Oral Daily Crosley, Debby, MD   50 mg at 05/22/17 1014  . ondansetron (ZOFRAN) tablet 4 mg  4 mg Oral Q6H PRN Crosley, Debby, MD       Or  . ondansetron (ZOFRAN) injection 4 mg  4 mg Intravenous Q6H PRN Crosley, Debby, MD      . polyethylene glycol (MIRALAX / GLYCOLAX) packet 17 g  17 g Oral Daily PRN Crosley, Debby, MD      . polyethylene glycol powder (GLYCOLAX/MIRALAX) container  255 g  1 Container Oral Once Lucilla Lame, MD      . potassium chloride SA (K-DUR,KLOR-CON) CR tablet 40 mEq  40 mEq Oral Daily Candelaria Stagers, RPH   40 mEq at 05/22/17 1014  . senna-docusate (Senokot-S) tablet 1 tablet  1 tablet Oral QHS PRN Crosley, Debby, MD      . torsemide (DEMADEX) tablet 5 mg  5 mg Oral Daily Crosley, Debby, MD   5 mg at 05/22/17 1014  . traZODone (DESYREL) tablet 50 mg  50 mg Oral QHS PRN Quintella Baton, MD   50 mg at 05/21/17 2244  . vancomycin (VANCOCIN) 1,250 mg in sodium chloride 0.9 % 250 mL IVPB  1,250 mg Intravenous Q24H Orbie Pyo, MD   Stopped at 05/22/17 0544    Past Medical History:  Diagnosis Date  . Anemia   . Anxiety 10/02/2012  . Atrial fibrillation ([place]) 11/13   new onset 11/13  . BCC (basal  cell carcinoma), face 04/24/2016  . Benign essential hypertension 03/13/2015  . Cellulitis of right upper extremity 01/27/2017  . Constipation 01/13/2015  . Decreased hearing 05/21/2015  . Health care maintenance 10/14/2014   Mammogram 10/01/15 - [facility] I.    . Hypercholesterolemia   . Hyperlipidemia, mixed 02/01/2017  . Hypertension   . Osteoarthritis    hands, feet  . Sick sinus syndrome ([place]) 01/04/2017   Overview:  Sp pacer placement 2018  . Syncope 12/08/2016    Past Surgical History:  Procedure Laterality Date  . APPENDECTOMY  1957  . CATARACT EXTRACTION  2013   left eye  . COLONOSCOPY  09/06/2008  . INCISION AND DRAINAGE ABSCESS Right 01/29/2017   Procedure: INCISION AND DRAINAGE ABSCESS;  Surgeon: Florene Glen, MD;  Location: [facility] ORS;  Service: General;  Laterality: Right;  . KIDNEY SURGERY  age 73  . PACEMAKER INSERTION Left 12/14/2016   Procedure: INSERTION PACEMAKER;  Surgeon: Isaias Cowman, MD;  Location: [facility] ORS;  Service: Cardiovascular;  Laterality: Left;  . TUBAL LIGATION    . VIDEO ASSISTED THORACOSCOPY (VATS)/THOROCOTOMY Right 04/19/2017   Procedure: RIGHT THORACOSCOPY AND EVACUATION OF HEMOTHORAX;  Surgeon: Nestor Lewandowsky, MD;  Location: [facility] ORS;  Service: General;  Laterality: Right;    Social History Social History  Substance Use Topics  . Smoking status: Never Smoker  . Smokeless tobacco: Never Used  . Alcohol use 0.0 oz/week     Comment: occasional    Family History Family History  Problem Relation Age of Onset  . Congestive Heart Failure Father   . Multiple myeloma Mother   . Hypertension Brother   . Alzheimer's disease Unknown        grandmother and great aunts  . Breast cancer Neg Hx   . Colon cancer Neg Hx   No family history of bleeding/clotting disorders, porphyria or autoimmune disease   No Known Allergies   REVIEW OF SYSTEMS (Negative unless checked)  Constitutional: [] Weight loss  [] Fever  [] Chills Cardiac: [] Chest pain    [] Chest pressure   [x] Palpitations   [] Shortness of breath when laying flat   [] Shortness of breath at rest   [x] Shortness of breath with exertion. Vascular:  [] Pain in legs with walking   [] Pain in legs at rest   [] Pain in legs when laying flat   [] Claudication   [] Pain in feet when walking  [] Pain in feet at rest  [] Pain in feet when laying flat   [] History of DVT   [] Phlebitis   [x] Swelling in arm   []   Varicose veins   [] Non-healing ulcers Pulmonary:   [] Uses home oxygen   [] Productive cough   [] Hemoptysis   [] Wheeze  [] COPD   [] Asthma Neurologic:  [] Dizziness  [] Blackouts   [] Seizures   [] History of stroke   [] History of TIA  [] Aphasia   [] Temporary blindness   [] Dysphagia   [] Weakness or numbness in arms   [] Weakness or numbness in legs Musculoskeletal:  [] Arthritis   [] Joint swelling   [] Joint pain   [] Low back pain Hematologic:  [] Easy bruising  [] Easy bleeding   [] Hypercoagulable state   [] Anemic  [] Hepatitis Gastrointestinal:  [] Blood in stool   [] Vomiting blood  [] Gastroesophageal reflux/heartburn   [] Difficulty swallowing. Genitourinary:  [] Chronic kidney disease   [] Difficult urination  [] Frequent urination  [] Burning with urination   [] Blood in urine Skin:  [] Rashes   [] Ulcers   [] Wounds Psychological:  [] History of anxiety   []  History of major depression.  Physical Examination  Vitals:   05/21/17 1202 05/21/17 1942 05/22/17 0326 05/22/17 1217  BP: (!) 107/48 (!) 110/52 (!) 108/54 123/60  Pulse: 76 67 68 67  Resp: 18 18 18 18   Temp:  (!) 97.5 F (36.4 C) 98 F (36.7 C) 98 F (36.7 C)  TempSrc:  Oral Oral Oral  SpO2: 99% 98% 96% 98%  Weight:   175 lb 6.4 oz (79.6 kg)   Height:       Body mass index is 32.08 kg/m. Gen:  WD/WN, NAD Head: Kristin Avila, No temporalis wasting. Prominent temp pulse not noted. Ear/Nose/Throat: Hearing grossly intact, nares w/o erythema or drainage, oropharynx w/o Erythema/Exudate Eyes: Sclera non-icteric, conjunctiva clear Neck: Trachea midline.  No  JVD.  Pulmonary:  Good air movement, respirations not labored, equal bilaterally.  Cardiac: RRR, normal S1, S2. Vascular: Left arm demonstrates 3-4+ edema soft pitting fingers are pink and warm motor function is normal Vessel Right Left  Radial Palpable Palpable  Ulnar Palpable Palpable  Brachial Palpable Palpable  Carotid Palpable, without bruit Palpable, without bruit  Gastrointestinal: soft, non-tender/non-distended. No guarding/reflex.  Musculoskeletal: M/S 5/5 throughout.  Extremities without ischemic changes.   Neurologic: Sensation grossly intact in extremities.  Symmetrical.  Speech is fluent. Motor exam as listed above. Psychiatric: Judgment intact, Mood & affect appropriate for pt's clinical situation. Dermatologic: No rashes or ulcers noted.  No cellulitis or open wounds. Lymph : No Cervical, Axillary, or Inguinal lymphadenopathy.   CBC Lab Results  Component Value Date   WBC 10.1 05/22/2017   HGB 8.1 (L) 05/22/2017   HCT 25.9 (L) 05/22/2017   MCV 76.1 (L) 05/22/2017   PLT 314 05/22/2017    BMET    Component Value Date/Time   NA 137 05/22/2017 0731   NA 140 08/29/2012 2339   K 4.0 05/22/2017 0731   K 3.8 08/29/2012 2339   CL 103 05/22/2017 0731   CL 107 08/29/2012 2339   CO2 27 05/22/2017 0731   CO2 29 08/29/2012 2339   GLUCOSE 86 05/22/2017 0731   GLUCOSE 97 08/29/2012 2339   BUN 21 (H) 05/22/2017 0731   BUN 21 (H) 08/29/2012 2339   CREATININE 0.96 05/22/2017 0731   CREATININE 0.56 (L) 08/29/2012 2339   CALCIUM 8.7 (L) 05/22/2017 0731   CALCIUM 9.4 08/29/2012 2339   GFRNONAA 52 (L) 05/22/2017 0731   GFRNONAA >60 08/29/2012 2339   GFRAA >60 05/22/2017 0731   GFRAA >60 08/29/2012 2339   Estimated Creatinine Clearance: 41.1 mL/min (by C-G formula based on SCr of 0.96 mg/dL).  COAG Lab Results  Component Value Date   INR 1.17 05/19/2017   INR 1.12 01/27/2017    Radiology Dg Chest 2 View  Result Date: 05/19/2017 CLINICAL DATA:  Shortness of  Breath.  Lower extremity swelling. EXAM: CHEST  2 VIEW COMPARISON:  04/13/2017 FINDINGS: Left pacer remains in place, unchanged. Cardiomegaly. Small bilateral pleural effusions. Continued right lower lung airspace disease, likely pneumonia, slightly improved. Left base atelectasis. IMPRESSION: Continued right lower lobe airspace disease, likely pneumonia with right effusion, slightly improved. Small left effusion. Left base atelectasis. Electronically Signed   By: Rolm Baptise M.D.   On: 05/19/2017 20:15   Dg Chest 2 View  Result Date: 05/13/2017 CLINICAL DATA:  Follow-up pneumonia EXAM: CHEST  2 VIEW COMPARISON:  05/06/2017, 04/28/2017, 04/18/2017, 12/14/2016 FINDINGS: Hyperinflation. Persistent small pleural effusions. No change in right lower lung pleural and parenchymal opacity. No change in right middle lobe opacity. Stable cardiomediastinal silhouette, slightly enlarged with atherosclerosis no pneumothorax. Left-sided pacing device as before IMPRESSION: 1. Continued right pleural and parenchymal disease in the right middle lobe and right lower lobe which may reflect combination of loculated pleural effusion and continued pneumonia. No significant change from prior. Continued radiographic follow-up recommended 2. Small left pleural effusion with left basilar atelectasis 3. Mild cardiomegaly Electronically Signed   By: Donavan Foil M.D.   On: 05/13/2017 16:16   Dg Chest 2 View  Result Date: 05/06/2017 CLINICAL DATA:  Cough. EXAM: CHEST  2 VIEW COMPARISON:  Radiographs of April 28, 2017. FINDINGS: Stable cardiomegaly. Atherosclerosis of thoracic aorta is noted. No pneumothorax is noted. Increased right middle lobe opacity is noted concerning for pneumonia or atelectasis with mild associated pleural effusion. Right-sided chest tube has been removed. Left-sided pacemaker is unchanged in position. Minimal left pleural effusion is noted. Bony thorax is unremarkable. IMPRESSION: Aortic atherosclerosis.  Stable cardiomegaly. Increased right middle lobe pneumonia or atelectasis is noted with mild associated pleural effusion. Electronically Signed   By: Marijo Conception, M.D.   On: 05/06/2017 17:13   Dg Chest 2 View  Result Date: 04/27/2017 CLINICAL DATA:  Post right thoracotomy for evacuation of hemothorax 1 week ago EXAM: CHEST  2 VIEW COMPARISON:  Chest x-ray of 04/26/2017 FINDINGS: This still appears to be a tiny right apical pneumothorax present with right chest tube noted. Small left pleural effusion remains. Cardiomegaly is stable. Permanent pacemaker is unchanged. IMPRESSION: 1. Little change in tiny right apical pneumothorax. Right chest tube remains. 2. Small left pleural effusion is unchanged. Stable cardiomegaly with pacer. Electronically Signed   By: Ivar Drape M.D.   On: 04/27/2017 08:13   Dg Chest 2 View  Result Date: 04/23/2017 CLINICAL DATA:  Postoperative evaluation.  Thoracoscopy 4 days ago. EXAM: CHEST  2 VIEW COMPARISON:  Yesterday FINDINGS: Small right apical pneumothorax, less than 5%, unchanged. Right-sided chest tubes in stable position, 1 tip at the apex, the other at the base. There is artifact from EKG leads. Dual-chamber pacer from the left. Small left pleural effusion and retrocardiac lung opacity. Stable cardiomegaly. IMPRESSION: Stable small right apical pneumothorax, less than 5%. Chest tubes are in stable position. Electronically Signed   By: Monte Fantasia M.D.   On: 04/23/2017 08:38   Ct Angio Chest Pe W Or Wo Contrast  Result Date: 05/20/2017 CLINICAL DATA:  Acute onset of shortness of breath and bilateral lower extremity swelling. Tiredness. Initial encounter. EXAM: CT ANGIOGRAPHY CHEST WITH CONTRAST TECHNIQUE: Multidetector CT imaging of the chest was performed using the standard protocol during bolus administration of  intravenous contrast. Multiplanar CT image reconstructions and MIPs were obtained to evaluate the vascular anatomy. CONTRAST:  75 mL of Isovue 370 IV  contrast COMPARISON:  Chest radiograph performed 05/19/2017 FINDINGS: Cardiovascular: Pulmonary embolus is noted within segmental and subsegmental branches to the right middle and lower lobes. The RV/LV ratio is 1.2, which may reflect right heart strain or some degree of underlying chronic right heart failure, given diffuse enlargement of the right side of the heart. The heart is enlarged. A pacemaker is noted at the left chest wall, with leads ending at the right atrium and right ventricle. Scattered calcification is noted along the aortic arch and proximal great vessels. Mediastinum/Nodes: The mediastinum is otherwise unremarkable. No mediastinal lymphadenopathy is seen. No significant pericardial effusion is seen. The visualized portions of the thyroid gland are grossly unremarkable. No axillary lymphadenopathy is appreciated. Vague edema is noted at the left axilla and extending inferiorly along the left chest wall, of uncertain significance. Lungs/Pleura: Small bilateral pleural effusions are noted, with underlying interstitial prominence and partial consolidation of the lower lobes bilaterally. This may reflect some degree of underlying interstitial edema. Mild peripheral opacities are noted within the left upper lobe, of uncertain significance. Would correlate for evidence of infection. No pneumothorax is seen. No dominant mass is identified. Fluid tracks along the right major fissure. Upper Abdomen: The visualized portions of the liver are unremarkable. There is reflux of contrast into the hepatic veins and IVC. Hypoattenuation along the periphery of the spleen may reflect remote traumatic injury. Musculoskeletal: No acute osseous abnormalities are identified. Degenerative change is noted at the left glenohumeral joint, with mild bony remodeling at the left humeral head. Overlying osseous fragments may reflect chronic changes of calcific tendinitis. The visualized musculature is unremarkable in appearance.  Review of the MIP images confirms the above findings. IMPRESSION: 1. Pulmonary embolus within segmental and subsegmental branches to the right middle and lower lung lobes. RV/LV ratio of 1.2 may reflect right heart strain or some degree of underlying chronic right heart failure, given diffuse enlargement of the right side of the heart. Right heart strain would be consistent with at least submassive (intermediate risk) PE. The presence of right heart strain has been associated with an increased risk of morbidity and mortality. 2. Small bilateral pleural effusions, with underlying interstitial prominence and partial consolidation of the lower lobes bilaterally. This may reflect some degree of underlying interstitial edema. Mild peripheral opacities at the left upper lobe are of uncertain significance. Would correlate for evidence of infection. 3. Osseous fragments overlying the left glenohumeral joint may reflect changes of calcific tendinitis. Underlying degenerative change at the left glenohumeral joint, with mild bony remodeling at the left humeral head. 4. Hypoattenuation along the periphery of the spleen may reflect remote traumatic injury. Critical Value/emergent results were called by telephone at the time of interpretation on 05/20/2017 at 2:54 am to [facility], who verbally acknowledged these results. Electronically Signed   By: Garald Balding M.D.   On: 05/20/2017 02:57   US Venous Img Upper Uni Left  Result Date: 05/19/2017 CLINICAL DATA:  Shortness of breath. Left upper extremity pain and edema for the past 2 days. History of malignancy. EXAM: LEFT UPPER EXTREMITY VENOUS DOPPLER ULTRASOUND TECHNIQUE: Gray-scale sonography with graded compression, as well as color Doppler and duplex ultrasound were performed to evaluate the upper extremity deep venous system from the level of the subclavian vein and including the jugular, axillary, basilic, radial, ulnar and  upper  cephalic vein. Spectral Doppler was utilized to evaluate flow at rest and with distal augmentation maneuvers. COMPARISON:  None. FINDINGS: Contralateral Subclavian Vein: Respiratory phasicity is normal and symmetric with the symptomatic side. No evidence of thrombus. Normal compressibility. Internal Jugular Vein: No evidence of thrombus. Normal compressibility, respiratory phasicity and response to augmentation. There is mixed echogenic occlusive thrombus extending from the left subclavian (image 11), through the left axillary (image 17) extending to involve 1 of the paired brachial veins (image 35). The additional paired brachial vein appears patent. The radioulnar veins appear patent where imaged. There is occlusive superficial thrombophlebitis within the left cephalic (image 20) and basilic vein (image 25). Other Findings:  None visualized. IMPRESSION: 1. Extensive occlusive DVT extending from the left subclavian through one of the paired left brachial veins. 2. Extensive occlusive superficial thrombophlebitis involving the left cephalic and basilic veins. Electronically Signed   By: Sandi Mariscal M.D.   On: 05/19/2017 21:05   Dg Chest Port 1 View  Result Date: 04/28/2017 CLINICAL DATA:  Postop, chest tube clamped EXAM: PORTABLE CHEST 1 VIEW COMPARISON:  04/28/2017 at 0739 hours FINDINGS: Tiny right apical pneumothorax, unchanged.  Stable right chest tube. Small left pleural effusion.  No frank interstitial edema. The heart is normal in size.  Left subclavian pacemaker. IMPRESSION: Tiny right apical pneumothorax, unchanged.  Stable right chest tube. Electronically Signed   By: Julian Hy M.D.   On: 04/28/2017 14:09   Dg Chest Port 1 View  Result Date: 04/28/2017 CLINICAL DATA:  Right pneumothorax. EXAM: PORTABLE CHEST 1 VIEW COMPARISON:  04/27/2017 FINDINGS: Single right-sided chest tube remains in place. Tiny residual right apical pneumothorax is essentially unchanged. There is increased  accentuation of the interstitial markings at the lung bases suggesting mild interstitial edema. Chronic cardiomegaly. Pacemaker in place. Small persistent left effusion. No appreciable right effusion. Blunting of the right costophrenic angle laterally. IMPRESSION: No change in tiny right apical pneumothorax. New interstitial accentuation suggesting mild pulmonary edema. Small left effusion, stable. Electronically Signed   By: Lorriane Shire M.D.   On: 04/28/2017 08:29   Dg Chest Port 1 View  Result Date: 04/26/2017 CLINICAL DATA:  Status post right thoracoscopy. EXAM: PORTABLE CHEST 1 VIEW COMPARISON:  Radiograph of April 25, 2017. FINDINGS: Stable cardiomegaly. Atherosclerosis of thoracic aorta is noted. Left-sided pacemaker is unchanged in position. Stable mild right apical pneumothorax is noted. Right-sided chest tube is unchanged in position. Stable bibasilar subsegmental atelectasis with associated pleural effusions is noted. Bony thorax is unremarkable. Degenerative changes seen involving the left glenohumeral joint. IMPRESSION: Aortic atherosclerosis. Stable bibasilar subsegmental atelectasis with associated pleural effusions. Stable mild right apical pneumothorax. Right-sided chest tube is unchanged in position. Electronically Signed   By: Marijo Conception, M.D.   On: 04/26/2017 07:46   Dg Chest Port 1 View  Result Date: 04/25/2017 CLINICAL DATA:  Right-sided chest tube.  Follow-up of pneumothorax. EXAM: PORTABLE CHEST 1 VIEW COMPARISON:  04/24/2017 FINDINGS: Dual lead pacer. Midline trachea. Mild cardiomegaly with transverse aortic atherosclerosis. Right hemidiaphragm elevation. Trace bilateral pleural effusions are similar. Approximately 5-10% right apical pneumothorax identified. Slightly decreased today, with visceral pleural line 10 mm from chest wall versus 13 mm yesterday. right chest tube remains in place. Resolution of congestive heart failure. Bibasilar airspace disease remains.  IMPRESSION: Slight decrease in 5-10% right apical pneumothorax. Cardiomegaly with persistent bibasilar atelectasis and probable small bilateral pleural effusions. Electronically Signed   By: Abigail Miyamoto M.D.   On: 04/25/2017 08:58  Dg Chest Port 1 View  Result Date: 04/24/2017 CLINICAL DATA:  Pneumothorax. EXAM: PORTABLE CHEST 1 VIEW COMPARISON:  Chest x-ray from yesterday. FINDINGS: Interval removal of the right apical chest tube. The right basilar chest tube is unchanged in position. The right apical pneumothorax is slightly increased in size, now approximately 10%. Stable cardiomegaly. Unchanged left chest wall AICD. Mild pulmonary vascular congestion. Unchanged left basilar opacity and small bilateral pleural effusions. IMPRESSION: 1. Interval removal of the right apical chest tube. Small right apical pneumothorax is slightly increased in size, now approximately 10%. 2. Stable cardiomegaly, mild pulmonary vascular congestion, and small bilateral pleural effusions. Unchanged left basilar atelectasis versus infiltrate. These results will be called to the ordering clinician or representative by the Radiologist Assistant, and communication documented in the PACS or zVision Dashboard. Electronically Signed   By: Titus Dubin M.D.   On: 04/24/2017 08:39      Assessment/Plan . Left upper extremity DVT (deep venous thrombosis) (HCC) At the present time the most likely cause of the left arm swelling is stricture or stenosis of the left subclavian vein secondary to pacemaker wires this is a common occurrence and is unlikely that the clot present in her arm was actually able to embolize. Conservative therapies with the left arm including elevation and compression sleeve are recommended. Anticoagulation would not be contraindicated however if other comorbidities would prevent anticoagulation this would not necessarily be a major issue.  With respect to the pulmonary emboli a have personally reviewed the  CT scan and do not appreciate these emboli. There seemed to be quite a few other findings on the scan which would explain her cardiopulmonary symptoms. Nevertheless, should cardiology feel that restarting anticoagulation would be of benefit given her atrial fibrillation then the proposed pulmonary emboli as well as any DVT in the arm would be simultaneously treated.  . Community acquired pneumonia Blood cultures ordered, Continue cefepime and vancomycin, Oxygen, doing nebs, respiratory to evaluate and treat  . Atrial fibrillation ([place]) Continue antiarrhythmia medications as already ordered, these medications have been reviewed and there are no changes at this time.  Continue anticoagulation as ordered by Cardiology Service   . Benign essential hypertension Continue antihypertensive medications as already ordered, these medications have been reviewed and there are no changes at this time.  . Hyperlipidemia, mixed Continue statin as ordered and reviewed, no changes at this time     Hortencia Pilar, MD  05/22/2017 2:10 PM    This note was created with Dragon medical transcription system.  Any error is purely unintentional

## 2017-05-22 NOTE — Progress Notes (Signed)
Dr mody paged to clarify orders of giving Lovenox due to patients drop in hgb and EGD/Colonoscopy tomorrow. Stated that it was ok to give lovenox.

## 2017-05-22 NOTE — Evaluation (Signed)
Physical Therapy Evaluation Patient Details Name: Kristin Avila MRN: [id number] DOB: 12-Dec-1927 Today's Date: 05/22/2017   History of Present Illness  83 year old female who for the past week has developed increasing shortness of breath.  Studies revealed the patient has a DVT, possible PEs.    Clinical Impression  Pt showed good effort with PT exam, though she did not do as well as she seemed to initially think she would.  Pt felt confident trying to walk w/o AD, and was able to do ~50 ft but fatigued significantly with the effort and did have a few small stagger steps/LOBs.  PT encouraged her to use a walker for now and to insure that family be able to check on her mid day if she is to go home.  She agreed and stated that this would not be an issue.  Pt will benefit from HHPT on discharge.    Follow Up Recommendations Home health PT;Supervision - Intermittent    Equipment Recommendations       Recommendations for Other Services       Precautions / Restrictions Precautions Precautions: Fall Restrictions Weight Bearing Restrictions: No      Mobility  Bed Mobility Overal bed mobility: Modified Independent Bed Mobility: Supine to Sit Rolling: Min guard            Transfers Overall transfer level: Modified independent   Transfers: Sit to/from Stand Sit to Stand: Min guard         General transfer comment: Pt able to sit to stand from recliner, required cues for hand placements.   Ambulation/Gait Ambulation/Gait assistance: Min assist Ambulation Distance (Feet): 50 Feet Assistive device: None       General Gait Details: Pt with small, cautious steps and speed much slower than her baseline. Pt reports that she does not typically use an AD, but PT encouraged her to use one as she did have occasional LOBs and general unsteadiness today w/o AD.  Stairs            Wheelchair Mobility    Modified Rankin (Stroke Patients Only)       Balance Overall  balance assessment: Needs assistance   Sitting balance-Leahy Scale: Good       Standing balance-Leahy Scale: Fair                               Pertinent Vitals/Pain Pain Assessment: No/denies pain    Home Living Family/patient expects to be discharged to:: Private residence Living Arrangements: Other (Comment) Available Help at Discharge: Family;Available PRN/intermittently Type of Home: House Home Access: Stairs to enter Entrance Stairs-Rails: None Entrance Stairs-Number of Steps: 1 Home Layout: One level Home Equipment: Walker - 2 wheels      Prior Function Level of Independence: Independent         Comments: Pt ambulates w/o AD, has had some falls.       Hand Dominance        Extremity/Trunk Assessment   Upper Extremity Assessment Upper Extremity Assessment: Overall WFL for tasks assessed;Generalized weakness (age appropriate limitations)    Lower Extremity Assessment Lower Extremity Assessment: Overall WFL for tasks assessed;Generalized weakness (age appropriate limitations)       Communication   Communication: HOH  Cognition Arousal/Alertness: Awake/alert Behavior During Therapy: WFL for tasks assessed/performed Overall Cognitive Status: Within Functional Limits for tasks assessed  General Comments      Exercises     Assessment/Plan    PT Assessment Patient needs continued PT services  PT Problem List Decreased strength;Decreased activity tolerance;Decreased mobility;Cardiopulmonary status limiting activity       PT Treatment Interventions Gait training;DME instruction;Therapeutic activities;Therapeutic exercise    PT Goals (Current goals can be found in the Care Plan section)  Acute Rehab PT Goals Patient Stated Goal: to return home PT Goal Formulation: With patient Time For Goal Achievement: 06/05/17 Potential to Achieve Goals: Good    Frequency Min 2X/week    Barriers to discharge        Co-evaluation               AM-PAC PT "6 Clicks" Daily Activity  Outcome Measure Difficulty turning over in bed (including adjusting bedclothes, sheets and blankets)?: None Difficulty moving from lying on back to sitting on the side of the bed? : None Difficulty sitting down on and standing up from a chair with arms (e.g., wheelchair, bedside commode, etc,.)?: A Little Help needed moving to and from a bed to chair (including a wheelchair)?: A Little Help needed walking in hospital room?: A Little Help needed climbing 3-5 steps with a railing? : A Lot 6 Click Score: 19    End of Session Equipment Utilized During Treatment: Gait belt Activity Tolerance: Patient tolerated treatment well;Patient limited by fatigue Patient left: in chair;with call bell/phone within reach;with chair alarm set   PT Visit Diagnosis: Unsteadiness on feet (R26.81);Other abnormalities of gait and mobility (R26.89);Muscle weakness (generalized) (M62.81)    Time: 6270-3500 PT Time Calculation (min) (ACUTE ONLY): 21 min   Charges:   PT Evaluation $PT Eval Low Complexity: 1 Low     PT G Codes:   PT G-Codes **NOT FOR INPATIENT CLASS** Functional Assessment Tool Used: AM-PAC 6 Clicks Basic Mobility Functional Limitation: Mobility: Walking and moving around Mobility: Walking and Moving Around Current Status ([id number]): At least 20 percent but less than 40 percent impaired, limited or restricted Mobility: Walking and Moving Around Goal Status [phone]): At least 1 percent but less than 20 percent impaired, limited or restricted    Kreg Shropshire, DPT 05/22/2017, 1:38 PM

## 2017-05-22 NOTE — Progress Notes (Signed)
[facility] at [place] NAME: Kristin Avila    MR#:  [id number]  DATE OF BIRTH:  September 22, 1927  SUBJECTIVE:   No issues this am Doing well  REVIEW OF SYSTEMS:    Review of Systems  Constitutional: Negative for fever, chills weight loss HENT: Negative for ear pain, nosebleeds, congestion, facial swelling, rhinorrhea, neck pain, neck stiffness and ear discharge.   Respiratory: Negative for cough, shortness of breath, wheezing  Cardiovascular: Negative for chest pain, palpitations and leg swelling.  Gastrointestinal: Negative for heartburn, abdominal pain, vomiting, diarrhea or consitpation Genitourinary: Negative for dysuria, urgency, frequency, hematuria Musculoskeletal: Negative for back pain or joint pain Neurological: Negative for dizziness, seizures, syncope, focal weakness,  numbness and headaches.  Hematological: Does not bruise/bleed easily.  Psychiatric/Behavioral: Negative for hallucinations, confusion, dysphoric mood Left upper extremity swelling   Tolerating Diet: yes      DRUG ALLERGIES:  No Known Allergies  VITALS:  Blood pressure 123/60, pulse 67, temperature 98 F (36.7 C), temperature source Oral, resp. rate 18, height 5\' 2"  (1.575 m), weight 79.6 kg (175 lb 6.4 oz), SpO2 98 %.  PHYSICAL EXAMINATION:  Constitutional: Appears well-developed and well-nourished. No distress. HENT: Normocephalic. Marland Kitchen Oropharynx is clear and moist.  Eyes: Conjunctivae and EOM are normal. PERRLA, no scleral icterus.  Neck: Normal ROM. Neck supple. No JVD. No tracheal deviation. CVS:irr, regular S1/S2 +, 3/6 RSB murmur, no gallops, no carotid bruit.  Pulmonary: Effort and breath sounds normal, no stridor, rhonchi, wheezes, rales.  Abdominal: Soft. BS +,  no distension, tenderness, rebound or guarding.  Musculoskeletal: Normal range of motion. LUE swelling  Neuro: Alert. CN 2-12 grossly intact. No focal deficits. Skin: Skin is warm and dry.  No rash noted. Psychiatric: Normal mood and affect.      LABORATORY PANEL:   CBC  Recent Labs Lab 05/22/17 0731  WBC 10.1  HGB 8.1*  HCT 25.9*  PLT 314   ------------------------------------------------------------------------------------------------------------------  Chemistries   Recent Labs Lab 05/19/17 1933  05/20/17 1622  05/22/17 0731  NA 133*  < >  --   < > 137  K 3.6  < >  --   < > 4.0  CL 99*  < >  --   < > 103  CO2 26  < >  --   < > 27  GLUCOSE 134*  < >  --   < > 86  BUN 27*  < >  --   < > 21*  CREATININE 1.00  < >  --   < > 0.96  CALCIUM 8.8*  < >  --   < > 8.7*  MG  --   --  1.7  --   --   AST 19  --   --   --   --   ALT 17  --   --   --   --   ALKPHOS 107  --   --   --   --   BILITOT 1.0  --   --   --   --   < > = values in this interval not displayed. ------------------------------------------------------------------------------------------------------------------  Cardiac Enzymes  Recent Labs Lab 05/19/17 1933  TROPONINI 0.05*   ------------------------------------------------------------------------------------------------------------------  RADIOLOGY:  No results found.   ASSESSMENT AND PLAN:   83 year old female with history of PAF who presented with shortness of breath and left upper extremity swelling.  1. 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 Patient was seen and evaluated by vascular surgery. As per vascular surgery it is not that the patient actually had a pulmonary emboli as read on CT scan by radiologist.  2. Acute on chronic anemia: Case discussed with GI Plan for EGD and colonoscopy  3.PAF: If EGD and colonoscopy did not show acute bleeding then patient will be started on anticoagulation Continue amiodarone  4. Pneumonia: Continue cefepime  5. Depression: Continue Prozac  6. Sick sinus syndrome  status post pacemaker  7. Hypokalemia: Repleted   PT consult for disposition.  Management plans discussed with the patient and she is in agreement.  CODE STATUS: full  TOTAL TIME TAKING CARE OF THIS PATIENT: 27 minutes.   D/w dr Allen Norris  POSSIBLE D/C 1-2 days, DEPENDING ON CLINICAL CONDITION.   Aidenn Skellenger M.D on 05/22/2017 at 1:03 PM  Between 7am to 6pm - Pager - [phone] After 6pm go to [email or web address] - password [email or web address] [facility]  Office  [phone]  CC: Primary care physician; Einar Pheasant, MD  Note: This dictation was prepared with Dragon dictation along with smaller phrase technology. Any transcriptional errors that result from this process are unintentional.

## 2017-05-23 ENCOUNTER — Encounter
Admission: EM | Disposition: A | Discharge status: Home Health | Payer: Self-pay | Source: Home / Self Care | Attending: Internal Medicine

## 2017-05-23 ENCOUNTER — Inpatient Hospital Stay: Payer: Medicare Other | Admitting: Anesthesiology

## 2017-05-23 DIAGNOSIS — D509 Iron deficiency anemia, unspecified: Secondary | ICD-10-CM

## 2017-05-23 HISTORY — PX: ESOPHAGOGASTRODUODENOSCOPY (EGD) WITH PROPOFOL: SHX5813

## 2017-05-23 HISTORY — PX: COLONOSCOPY WITH PROPOFOL: SHX5780

## 2017-05-23 LAB — CBC WITH DIFFERENTIAL/PLATELET
Basophils Absolute: 0.1 10*3/uL (ref 0–0.1)
Basophils Relative: 1 %
EOS ABS: 0.1 10*3/uL (ref 0–0.7)
Eosinophils Relative: 1 %
HEMATOCRIT: 29.9 % — AB (ref 35.0–47.0)
HEMOGLOBIN: 9.2 g/dL — AB (ref 12.0–16.0)
LYMPHS ABS: 0.9 10*3/uL — AB (ref 1.0–3.6)
LYMPHS PCT: 12 %
MCH: 23.7 pg — AB (ref 26.0–34.0)
MCHC: 30.9 g/dL — AB (ref 32.0–36.0)
MCV: 76.6 fL — AB (ref 80.0–100.0)
MONOS PCT: 10 %
Monocytes Absolute: 0.7 10*3/uL (ref 0.2–0.9)
NEUTROS ABS: 5.7 10*3/uL (ref 1.4–6.5)
NEUTROS PCT: 76 %
Platelets: 298 10*3/uL (ref 150–440)
RBC: 3.9 MIL/uL (ref 3.80–5.20)
RDW: 19.7 % — ABNORMAL HIGH (ref 11.5–14.5)
WBC: 7.4 10*3/uL (ref 3.6–11.0)

## 2017-05-23 LAB — BASIC METABOLIC PANEL
Anion gap: 7 (ref 5–15)
BUN: 21 mg/dL — AB (ref 6–20)
CALCIUM: 8.3 mg/dL — AB (ref 8.9–10.3)
CO2: 27 mmol/L (ref 22–32)
CREATININE: 1.12 mg/dL — AB (ref 0.44–1.00)
Chloride: 100 mmol/L — ABNORMAL LOW (ref 101–111)
GFR calc Af Amer: 50 mL/min — ABNORMAL LOW (ref >60)
GFR, EST NON AFRICAN AMERICAN: 43 mL/min — AB (ref >60)
Glucose, Bld: 82 mg/dL (ref 65–99)
Potassium: 3.6 mmol/L (ref 3.5–5.1)
SODIUM: 134 mmol/L — AB (ref 135–145)

## 2017-05-23 LAB — COMPREHENSIVE METABOLIC PANEL
ALK PHOS: 97 U/L (ref 38–126)
ALT: 16 U/L (ref 14–54)
ANION GAP: 9 (ref 5–15)
AST: 24 U/L (ref 15–41)
Albumin: 2.8 g/dL — ABNORMAL LOW (ref 3.5–5.0)
BILIRUBIN TOTAL: 0.8 mg/dL (ref 0.3–1.2)
BUN: 19 mg/dL (ref 6–20)
CALCIUM: 8.8 mg/dL — AB (ref 8.9–10.3)
CO2: 27 mmol/L (ref 22–32)
CREATININE: 0.99 mg/dL (ref 0.44–1.00)
Chloride: 102 mmol/L (ref 101–111)
GFR, EST AFRICAN AMERICAN: 58 mL/min — AB (ref >60)
GFR, EST NON AFRICAN AMERICAN: 50 mL/min — AB (ref >60)
Glucose, Bld: 85 mg/dL (ref 65–99)
Potassium: 3.6 mmol/L (ref 3.5–5.1)
Sodium: 138 mmol/L (ref 135–145)
TOTAL PROTEIN: 6.5 g/dL (ref 6.5–8.1)

## 2017-05-23 LAB — CBC
HCT: 27.3 % — ABNORMAL LOW (ref 35.0–47.0)
Hemoglobin: 8.5 g/dL — ABNORMAL LOW (ref 12.0–16.0)
MCH: 24 pg — AB (ref 26.0–34.0)
MCHC: 31.1 g/dL — AB (ref 32.0–36.0)
MCV: 77.1 fL — ABNORMAL LOW (ref 80.0–100.0)
PLATELETS: 311 10*3/uL (ref 150–440)
RBC: 3.54 MIL/uL — ABNORMAL LOW (ref 3.80–5.20)
RDW: 19.9 % — ABNORMAL HIGH (ref 11.5–14.5)
WBC: 10.5 10*3/uL (ref 3.6–11.0)

## 2017-05-23 LAB — MAGNESIUM: MAGNESIUM: 1.6 mg/dL — AB (ref 1.7–2.4)

## 2017-05-23 SURGERY — ESOPHAGOGASTRODUODENOSCOPY (EGD) WITH PROPOFOL
Anesthesia: General

## 2017-05-23 MED ORDER — ONDANSETRON HCL 4 MG/2ML IJ SOLN: 4.0000 mg | Freq: Once | INTRAMUSCULAR | Status: DC | PRN

## 2017-05-23 MED ORDER — LIDOCAINE HCL (PF) 2 % IJ SOLN
INTRAMUSCULAR | Status: AC
  Filled 2017-05-23: qty 10

## 2017-05-23 MED ORDER — MAGNESIUM SULFATE 2 GM/50ML IV SOLN
2.0000 g | Freq: Once | INTRAVENOUS | Status: AC
  Administered 2017-05-23: 2 g via INTRAVENOUS
  Filled 2017-05-23: qty 50

## 2017-05-23 MED ORDER — FENTANYL CITRATE (PF) 100 MCG/2ML IJ SOLN: 25.0000 ug | INTRAMUSCULAR | Status: DC | PRN

## 2017-05-23 MED ORDER — APIXABAN 5 MG PO TABS
10.0000 mg | ORAL_TABLET | Freq: Two times a day (BID) | ORAL | Status: DC
  Administered 2017-05-23 – 2017-05-24 (×2): 10 mg via ORAL
  Filled 2017-05-23 (×2): qty 2

## 2017-05-23 MED ORDER — APIXABAN 5 MG PO TABS: 5.0000 mg | ORAL_TABLET | Freq: Two times a day (BID) | ORAL | Status: DC

## 2017-05-23 MED ORDER — PROPOFOL 500 MG/50ML IV EMUL
INTRAVENOUS | Status: AC
  Filled 2017-05-23: qty 50

## 2017-05-23 MED ORDER — SODIUM CHLORIDE 0.9% FLUSH
3.0000 mL | Freq: Two times a day (BID) | INTRAVENOUS | Status: DC
  Administered 2017-05-23: 3 mL via INTRAVENOUS

## 2017-05-23 MED ORDER — PROPOFOL 500 MG/50ML IV EMUL
INTRAVENOUS | Status: DC | PRN
  Administered 2017-05-23: 100 ug/kg/min via INTRAVENOUS

## 2017-05-23 MED ORDER — LIDOCAINE HCL (CARDIAC) 20 MG/ML IV SOLN
INTRAVENOUS | Status: DC | PRN
  Administered 2017-05-23: 60 mg via INTRAVENOUS

## 2017-05-23 MED ORDER — SODIUM CHLORIDE 0.9% FLUSH
3.0000 mL | Freq: Two times a day (BID) | INTRAVENOUS | Status: DC
  Administered 2017-05-23 – 2017-05-24 (×2): 3 mL via INTRAVENOUS

## 2017-05-23 MED ORDER — SODIUM CHLORIDE 0.9 % IV SOLN
INTRAVENOUS | Status: DC | PRN
  Administered 2017-05-23 (×2): via INTRAVENOUS

## 2017-05-23 MED ORDER — PHENYLEPHRINE HCL 10 MG/ML IJ SOLN
INTRAMUSCULAR | Status: DC | PRN
  Administered 2017-05-23 (×3): 100 ug via INTRAVENOUS

## 2017-05-23 NOTE — Op Note (Signed)
[facility] Gastroenterology Patient Name: Kristin Avila Procedure Date: 05/23/2017 12:21 PM MRN: [id number] Account #: [id number] Date of Birth: 04-Feb-1928 Admit Type: Inpatient Age: 83 Room: 21 [facility] ENDO ROOM 2 Gender: Female Note Status: Finalized Procedure:            Colonoscopy Indications:          Iron deficiency anemia Providers:            Lucilla Lame MD, MD Medicines:            Propofol per Anesthesia Complications:        No immediate complications. Procedure:            Pre-Anesthesia Assessment:                       - Prior to the procedure, a History and Physical was                        performed, and patient medications and allergies were                        reviewed. The patient's tolerance of previous                        anesthesia was also reviewed. The risks and benefits of                        the procedure and the sedation options and risks were                        discussed with the patient. All questions were                        answered, and informed consent was obtained. Prior                        Anticoagulants: The patient has taken no previous                        anticoagulant or antiplatelet agents. ASA Grade                        Assessment: II - A patient with mild systemic disease.                        After reviewing the risks and benefits, the patient was                        deemed in satisfactory condition to undergo the                        procedure.                       After obtaining informed consent, the colonoscope was                        passed under direct vision. Throughout the procedure,                        the patient's blood pressure, pulse, and  oxygen                        saturations were monitored continuously. The                        Colonoscope was introduced through the anus and                        advanced to the the cecum, identified by appendiceal            orifice and ileocecal valve. The colonoscopy was                        performed without difficulty. The patient tolerated the                        procedure well. The quality of the bowel preparation                        was fair. Findings:      The perianal and digital rectal examinations were normal.      Semi-liquid stool was found in the entire colon, without interference to       visualization.      Non-bleeding internal hemorrhoids were found during retroflexion. The       hemorrhoids were Grade II (internal hemorrhoids that prolapse but reduce       spontaneously). Impression:           - Preparation of the colon was fair.                       - Stool in the entire examined colon.                       - Non-bleeding internal hemorrhoids.                       - No specimens collected. Recommendation:       - Return patient to hospital ward for ongoing care.                       - Resume previous diet. Procedure Code(s):    --- Professional ---                       [phone], Colonoscopy, flexible; diagnostic, including                        collection of specimen(s) by brushing or washing, when                        performed (separate procedure) Diagnosis Code(s):    --- Professional ---                       D50.9, Iron deficiency anemia, unspecified CPT copyright 2016 American Medical Association. All rights reserved. The codes documented in this report are preliminary and upon coder review may  be revised to meet current compliance requirements. Lucilla Lame MD, MD 05/23/2017 1:12:47 PM This report has been signed electronically. Number of Addenda: 0 Note Initiated On: 05/23/2017 12:21 PM Scope Withdrawal Time: 0 hours 5 minutes 53 seconds  Total Procedure  Duration: 0 hours 15 minutes 57 seconds       [facility]

## 2017-05-23 NOTE — Anesthesia Postprocedure Evaluation (Signed)
Anesthesia Post Note  Patient: Kristin Avila  Procedure(s) Performed: ESOPHAGOGASTRODUODENOSCOPY (EGD) WITH PROPOFOL (N/A ) COLONOSCOPY WITH PROPOFOL (N/A )  Patient location during evaluation: PACU Anesthesia Type: General Level of consciousness: awake and alert Pain management: pain level controlled Vital Signs Assessment: post-procedure vital signs reviewed and stable Respiratory status: spontaneous breathing and respiratory function stable Cardiovascular status: stable Anesthetic complications: no     Last Vitals:  Vitals:   05/23/17 0353 05/23/17 1323  BP: (!) 129/59 (!) 106/55  Pulse: 67 66  Resp: 18 18  Temp: 37 C (!) 36.2 C  SpO2: 99% 100%    Last Pain:  Vitals:   05/23/17 1323  TempSrc:   PainSc: Asleep                 Kamryn Gauthier K

## 2017-05-23 NOTE — Transfer of Care (Signed)
Immediate Anesthesia Transfer of Care Note  Patient: Kristin Avila  Procedure(s) Performed: ESOPHAGOGASTRODUODENOSCOPY (EGD) WITH PROPOFOL (N/A ) COLONOSCOPY WITH PROPOFOL (N/A )  Patient Location: PACU  Anesthesia Type:General  Level of Consciousness: sedated  Airway & Oxygen Therapy: Patient Spontanous Breathing and Patient connected to nasal cannula oxygen  Post-op Assessment: Report given to RN and Post -op Vital signs reviewed and stable  Post vital signs: Reviewed and stable  Last Vitals:  Vitals:   05/22/17 2014 05/23/17 0353  BP: (!) 111/52 (!) 129/59  Pulse: 71 67  Resp: 18 18  Temp: 36.8 C 37 C  SpO2: 100% 99%    Last Pain:  Vitals:   05/23/17 0827  TempSrc:   PainSc: 0-No pain         Complications: No apparent anesthesia complications

## 2017-05-23 NOTE — Anesthesia Preprocedure Evaluation (Signed)
Anesthesia Evaluation  Patient identified by MRN, date of birth, ID band Patient awake    Reviewed: Allergy & Precautions, NPO status , Patient's Chart, lab work & pertinent test results  History of Anesthesia Complications Negative for: history of anesthetic complications  Airway Mallampati: II       Dental  (+) Chipped, Missing   Pulmonary neg sleep apnea, neg COPD,           Cardiovascular hypertension, Pt. on medications (-) Past MI and (-) CHF + pacemaker (-) Valvular Problems/Murmurs     Neuro/Psych neg Seizures Anxiety    GI/Hepatic Neg liver ROS, neg GERD  ,  Endo/Other  neg diabetes  Renal/GU negative Renal ROS     Musculoskeletal   Abdominal   Peds  Hematology  (+) anemia ,   Anesthesia Other Findings   Reproductive/Obstetrics                             Anesthesia Physical Anesthesia Plan  ASA: III  Anesthesia Plan: General   Post-op Pain Management:    Induction: Intravenous  PONV Risk Score and Plan: Propofol infusion  Airway Management Planned:   Additional Equipment:   Intra-op Plan:   Post-operative Plan:   Informed Consent: I have reviewed the patients History and Physical, chart, labs and discussed the procedure including the risks, benefits and alternatives for the proposed anesthesia with the patient or authorized representative who has indicated his/her understanding and acceptance.     Plan Discussed with:   Anesthesia Plan Comments:         Anesthesia Quick Evaluation

## 2017-05-23 NOTE — Progress Notes (Signed)
[place] at [place] NAME: Kristin Avila    MR#:  [id number]  DATE OF BIRTH:  09-Aug-1928  SUBJECTIVE:   Had rough night with bowel prep  Family at bedside No melena  REVIEW OF SYSTEMS:    Review of Systems  Constitutional: Negative for fever, chills weight loss HENT: Negative for ear pain, nosebleeds, congestion, facial swelling, rhinorrhea, neck pain, neck stiffness and ear discharge.   Respiratory: Negative for cough, shortness of breath, wheezing  Cardiovascular: Negative for chest pain, palpitations and leg swelling.  Gastrointestinal: Negative for heartburn, abdominal pain, vomiting, diarrhea or consitpation Genitourinary: Negative for dysuria, urgency, frequency, hematuria Musculoskeletal: Negative for back pain or joint pain Neurological: Negative for dizziness, seizures, syncope, focal weakness,  numbness and headaches.  Hematological: Does not bruise/bleed easily.  Psychiatric/Behavioral: Negative for hallucinations, confusion, dysphoric mood Left upper extremity swelling   Tolerating Diet: yes      DRUG ALLERGIES:  No Known Allergies  VITALS:  Blood pressure (!) 129/59, pulse 67, temperature 98.6 F (37 C), temperature source Oral, resp. rate 18, height 5\' 2"  (1.575 m), weight 78.4 kg (172 lb 14.4 oz), SpO2 99 %.  PHYSICAL EXAMINATION:  Constitutional: Appears well-developed and well-nourished. No distress. HENT: Normocephalic. Marland Kitchen Oropharynx is clear and moist.  Eyes: Conjunctivae and EOM are normal. PERRLA, no scleral icterus.  Neck: Normal ROM. Neck supple. No JVD. No tracheal deviation. CVS:irr, regular S1/S2 +, 3/6 RSB murmur, no gallops, no carotid bruit.  Pulmonary: Effort and breath sounds normal, no stridor, rhonchi, wheezes, rales.  Abdominal: Soft. BS +,  no distension, tenderness, rebound or guarding.  Musculoskeletal: Normal range of motion. LUE swelling  Neuro: Alert. CN 2-12 grossly intact. No focal  deficits. Skin: Skin is warm and dry. No rash noted. Psychiatric: Normal mood and affect.      LABORATORY PANEL:   CBC  Recent Labs Lab 05/23/17 0507  WBC 10.5  HGB 8.5*  HCT 27.3*  PLT 311   ------------------------------------------------------------------------------------------------------------------  Chemistries   Recent Labs Lab 05/19/17 1933  05/23/17 0700  NA 133*  < > 134*  K 3.6  < > 3.6  CL 99*  < > 100*  CO2 26  < > 27  GLUCOSE 134*  < > 82  BUN 27*  < > 21*  CREATININE 1.00  < > 1.12*  CALCIUM 8.8*  < > 8.3*  MG  --   < > 1.6*  AST 19  --   --   ALT 17  --   --   ALKPHOS 107  --   --   BILITOT 1.0  --   --   < > = values in this interval not displayed. ------------------------------------------------------------------------------------------------------------------  Cardiac Enzymes  Recent Labs Lab 05/19/17 1933  TROPONINI 0.05*   ------------------------------------------------------------------------------------------------------------------  RADIOLOGY:  No results found.   ASSESSMENT AND PLAN:   83 year old female with history of PAF who presented with shortness of breath and left upper extremity swelling.  1. 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 Patient was seen and evaluated by vascular surgery. As per vascular surgery it is not that the patient actually had a pulmonary emboli as read on CT scan by radiologist.  2. Acute on chronic microcytic anemia: Plan for EGD and colonoscopy today  3.PAF: If EGD and colonoscopy did not show acute bleeding then patient will be started on anticoagulation Continue  amiodarone  4. Pneumonia: Continue cefepime for now  5. Depression: Continue Prozac  6. Sick sinus syndrome status post pacemaker  7. Hypokalemia: Repleted   PT consult for disposition recommending home health  PT  Management plans discussed with the patient and she is in agreement.  CODE STATUS: full  TOTAL TIME TAKING CARE OF THIS PATIENT: 27 minutes.   D/w dr Allen Norris and family  POSSIBLE D/C 1-2 days, DEPENDING ON CLINICAL CONDITION.   Dylyn Mclaren M.D on 05/23/2017 at 11:08 AM  Between 7am to 6pm - Pager - [phone] After 6pm go to [email or web address] - password [email or web address] [facility]  Office  [phone]  CC: Primary care physician; Einar Pheasant, MD  Note: This dictation was prepared with Dragon dictation along with smaller phrase technology. Any transcriptional errors that result from this process are unintentional.

## 2017-05-23 NOTE — Progress Notes (Signed)
ANTICOAGULATION CONSULT NOTE - Initial Consult  Pharmacy Consult for apixaban Indication: DVT  No Active Allergies  Patient Measurements: Height: 5\' 2"  (157.5 cm) Weight: 172 lb 14.4 oz (78.4 kg) IBW/kg (Calculated) : 50.1  Vital Signs: Temp: 97.4 F (36.3 C) (10/14 1431) Temp Source: Oral (10/14 1431) BP: 134/70 (10/14 1431) Pulse Rate: 102 (10/14 1431)  Labs:  Recent Labs  05/21/17 0446 05/22/17 0731 05/23/17 0507 05/23/17 0700 05/23/17 1455  HGB 7.9* 8.1* 8.5*  --  9.2*  HCT 24.8* 25.9* 27.3*  --  29.9*  PLT 258 314 311  --  298  HEPARINUNFRC 0.54  --   --   --   --   CREATININE 0.78 0.96  --  1.12* 0.99    Estimated Creatinine Clearance: 39.5 mL/min (by C-G formula based on SCr of 0.99 mg/dL).  Assessment: Patient with new DVT, had colonoscopy today that did not show any bleeding. No biopsies taken. Per Dr. Benjie Karvonen to start treatment dose apixaban today.   Plan:  Apixaban 10mg  PO BID x 7 days followed by apixaban 5mg  PO BID  Oron Westrup C 05/23/2017,5:04 PM

## 2017-05-23 NOTE — Anesthesia Post-op Follow-up Note (Signed)
Anesthesia QCDR form completed.        

## 2017-05-23 NOTE — Op Note (Signed)
[facility] Patient Name: Kristin Avila Procedure Date: 05/23/2017 12:12 PM MRN: [id number] Account #: [id number] Date of Birth: 04-09-1928 Admit Type: Inpatient Age: 83 Room: [facility] ENDO ROOM 2 Gender: Female Note Status: Finalized Procedure:            Upper GI endoscopy Indications:          Iron deficiency anemia Providers:            Lucilla Lame MD, MD Medicines:            Propofol per Anesthesia Complications:        No immediate complications. Procedure:            Pre-Anesthesia Assessment:                       - Prior to the procedure, a History and Physical was                        performed, and patient medications and allergies were                        reviewed. The patient's tolerance of previous                        anesthesia was also reviewed. The risks and benefits of                        the procedure and the sedation options and risks were                        discussed with the patient. All questions were                        answered, and informed consent was obtained. Prior                        Anticoagulants: The patient has taken no previous                        anticoagulant or antiplatelet agents. ASA Grade                        Assessment: II - A patient with mild systemic disease.                        After reviewing the risks and benefits, the patient was                        deemed in satisfactory condition to undergo the                        procedure.                       After obtaining informed consent, the endoscope was                        passed under direct vision. Throughout the procedure,                        the patient's blood pressure,  pulse, and oxygen                        saturations were monitored continuously. The Endoscope                        was introduced through the mouth, and advanced to the                        second part of duodenum. The upper GI  endoscopy was                        accomplished without difficulty. The patient tolerated                        the procedure well. Findings:      A small hiatal hernia was present.      The stomach was normal.      The examined duodenum was normal. Impression:           - Small hiatal hernia.                       - Normal stomach.                       - Normal examined duodenum.                       - No specimens collected. Recommendation:       - Return patient to hospital ward for ongoing care.                       - Perform a colonoscopy today. Procedure Code(s):    --- Professional ---                       [phone], Esophagogastroduodenoscopy, flexible, transoral;                        diagnostic, including collection of specimen(s) by                        brushing or washing, when performed (separate procedure) Diagnosis Code(s):    --- Professional ---                       D50.9, Iron deficiency anemia, unspecified CPT copyright 2016 American Medical Association. All rights reserved. The codes documented in this report are preliminary and upon coder review may  be revised to meet current compliance requirements. Lucilla Lame MD, MD 05/23/2017 12:53:01 PM This report has been signed electronically. Number of Addenda: 0 Note Initiated On: 05/23/2017 12:12 PM      [facility]

## 2017-05-24 ENCOUNTER — Telehealth: Payer: Self-pay | Admitting: Internal Medicine

## 2017-05-24 ENCOUNTER — Encounter: Payer: Self-pay | Admitting: Gastroenterology

## 2017-05-24 DIAGNOSIS — I4891 Unspecified atrial fibrillation: Secondary | ICD-10-CM

## 2017-05-24 DIAGNOSIS — I82629 Acute embolism and thrombosis of deep veins of unspecified upper extremity: Secondary | ICD-10-CM

## 2017-05-24 LAB — BASIC METABOLIC PANEL
Anion gap: 9 (ref 5–15)
BUN: 20 mg/dL (ref 6–20)
CHLORIDE: 102 mmol/L (ref 101–111)
CO2: 27 mmol/L (ref 22–32)
Calcium: 8.9 mg/dL (ref 8.9–10.3)
Creatinine, Ser: 1.01 mg/dL — ABNORMAL HIGH (ref 0.44–1.00)
GFR calc non Af Amer: 49 mL/min — ABNORMAL LOW (ref >60)
GFR, EST AFRICAN AMERICAN: 57 mL/min — AB (ref >60)
Glucose, Bld: 73 mg/dL (ref 65–99)
Potassium: 3.6 mmol/L (ref 3.5–5.1)
SODIUM: 138 mmol/L (ref 135–145)

## 2017-05-24 LAB — CBC
HEMATOCRIT: 26.8 % — AB (ref 35.0–47.0)
HEMOGLOBIN: 8.4 g/dL — AB (ref 12.0–16.0)
MCH: 24.2 pg — AB (ref 26.0–34.0)
MCHC: 31.5 g/dL — ABNORMAL LOW (ref 32.0–36.0)
MCV: 76.7 fL — ABNORMAL LOW (ref 80.0–100.0)
Platelets: 300 10*3/uL (ref 150–440)
RBC: 3.49 MIL/uL — ABNORMAL LOW (ref 3.80–5.20)
RDW: 19.8 % — ABNORMAL HIGH (ref 11.5–14.5)
WBC: 7.7 10*3/uL (ref 3.6–11.0)

## 2017-05-24 MED ORDER — APIXABAN 5 MG PO TABS: 5.0000 mg | ORAL_TABLET | Freq: Two times a day (BID) | ORAL | 0 refills | Status: AC

## 2017-05-24 MED ORDER — LEVOFLOXACIN 750 MG PO TABS: 750.0000 mg | ORAL_TABLET | Freq: Every day | ORAL | 0 refills | Status: DC

## 2017-05-24 MED ORDER — METOPROLOL SUCCINATE ER 25 MG PO TB24
12.5000 mg | ORAL_TABLET | Freq: Every day | ORAL | Status: DC
  Administered 2017-05-24: 12.5 mg via ORAL
  Filled 2017-05-24: qty 1

## 2017-05-24 MED ORDER — LEVOFLOXACIN 500 MG PO TABS: 750.0000 mg | ORAL_TABLET | ORAL | 0 refills | Status: DC

## 2017-05-24 MED ORDER — APIXABAN 5 MG PO TABS: 10.0000 mg | ORAL_TABLET | Freq: Two times a day (BID) | ORAL | 0 refills | Status: DC

## 2017-05-24 NOTE — Telephone Encounter (Signed)
Transition Care Management Follow-up Telephone Call  How have you been since you were released from the hospital? Patient is very weak, but doing well.   Do you understand why you were in the hospital? Yes   Do you understand the discharge instrcutions? Yes  Items Reviewed:  Medications reviewed: Yes  Allergies reviewed: Yes  Dietary changes reviewed: Yes  Referrals reviewed: Yes   Functional Questionnaire:   Activities of Daily Living (ADLs):   She states they are independent in the following: Only in feeding her self. States they require assistance with the following: Patient needs help with dressing and bathing.   Any transportation issues/concerns?: No   Any patient concerns? Yes, daughter has a concern about being on eliquis, because she has had bleeding in the past.   Confirmed importance and date/time of follow-up visits scheduled:Yes,   Confirmed with patient if condition begins to worsen call PCP or go to the ER.  Patient was given the Call-a-Nurse line [phone]: Yes

## 2017-05-24 NOTE — Telephone Encounter (Signed)
HFU, Altha Harm called regarding Pt is being discharged today from the hospital. Dx was DVT. Pt needs to be seen in three days. No appt avail to sch. Please advise? Thank you!

## 2017-05-24 NOTE — Progress Notes (Signed)
Discharge instructions explained to pt and pts daughter/ verbalized an  Understanding. Iv and tele removed/ will transport off unit via wheelchair.

## 2017-05-24 NOTE — Telephone Encounter (Signed)
Yes

## 2017-05-24 NOTE — Telephone Encounter (Signed)
You have a 3 or 3.30 available on Friday that says hold can I have that spot for HFU?

## 2017-05-24 NOTE — Care Management (Signed)
Patient to discharge home today.  Patient lives at home with grandson.  PT has assessed patient and recommends home health PT.  Patient has used [facility] home [facility] in the past, and per daughter Arbie Cookey would like to use them again.  Referral made to Sarah with [facility].  Patient has BSC, walker, WC, and elevated toilet seat in the home.  RNCM signing off.

## 2017-05-24 NOTE — Discharge Summary (Signed)
[place] [facility] [place] NAME: Kristin Avila    MR#:  [id number]  DATE OF BIRTH:  Feb 05, 1928  DATE OF ADMISSION:  05/19/2017 ADMITTING PHYSICIAN: Quintella Baton, MD  DATE OF DISCHARGE: 05/24/2017  PRIMARY CARE PHYSICIAN: Einar Pheasant, MD    ADMISSION DIAGNOSIS:  Peripheral edema [R60.9] Hospital-acquired pneumonia [J18.9] Acute deep vein thrombosis (DVT) of left upper extremity, unspecified vein (HCC) [I82.622]  DISCHARGE DIAGNOSIS:  Active Problems:   Hypertension   Atrial fibrillation (HCC)   Benign essential hypertension   Hyperlipidemia, mixed   DVT (deep venous thrombosis) ([place])   Community acquired pneumonia   Iron deficiency anemia due to chronic blood loss   Iron deficiency anemia   SECONDARY DIAGNOSIS:   Past Medical History:  Diagnosis Date  . Anemia   . Anxiety 10/02/2012  . Atrial fibrillation ([place]) 11/13   new onset 11/13  . BCC (basal cell carcinoma), face 04/24/2016  . Benign essential hypertension 03/13/2015  . Cellulitis of right upper extremity 01/27/2017  . Constipation 01/13/2015  . Decreased hearing 05/21/2015  . Health care maintenance 10/14/2014   Mammogram 10/01/15 - Birads I.    . Hypercholesterolemia   . Hyperlipidemia, mixed 02/01/2017  . Hypertension   . Osteoarthritis    hands, feet  . Sick sinus syndrome ([place]) 01/04/2017   Overview:  Sp pacer placement 2018  . Syncope 12/08/2016    HOSPITAL COURSE:   83 year old female with history of PAF who presented with shortness of breath and left upper extremity swelling.  1. 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 Patient was seen and evaluated by vascular surgery. As per vascular surgery they did not feel that patient actually had a pulmonary emboli as read on CT scan by radiologist. In any case she will require anticoagulation due to the DVT  in her left arm as well as atrial fibrillation. side effects, alternatives, risks and benefits were discussed with patient and patient's daughter.  2. Acute on chronic microcytic iron deficiency anemia: due to anemia patient underwent EGD and colonoscopy. EGD did not show evidence of ulcers. Colonoscopy only showed internal hemorrhoids. There was no source of bleeding. She may safely start anticoagulation.   3.PAF: she was started on anticoagulation after GI workup. She will continue metoprolol for heart rate control and amiodarone she will have cardiology follow-up in one week  4. Pneumonia: She was treated for 5 days on cefepime and will be discharged with Levaquin to complete course of treatment. 5. Depression: Continue Prozac  6. Sick sinus syndrome status post pacemaker  7. Hypokalemia: Repleted   DISCHARGE CONDITIONS AND DIET:   Stable for discharge on heart healthy diet  CONSULTS OBTAINED:  Treatment Team:  Isaias Cowman, MD Schnier, Dolores Lory, MD  DRUG ALLERGIES:  No Active Allergies  DISCHARGE MEDICATIONS:   Current Discharge Medication List    START taking these medications   Details  !! apixaban (ELIQUIS) 5 MG TABS tablet Take 2 tablets (10 mg total) by mouth 2 (two) times daily. Qty: 20 tablet, Refills: 0    !! apixaban (ELIQUIS) 5 MG TABS tablet Take 1 tablet (5 mg total) by mouth 2 (two) times daily. Qty: 60 tablet, Refills: 0    levofloxacin (LEVAQUIN) 500 MG tablet Take 1.5 tablets (750 mg total) by mouth every other day. Qty: 2 tablet, Refills: 0     !! - Potential duplicate medications  found. Please discuss with provider.    CONTINUE these medications which have NOT CHANGED   Details  acetaminophen (TYLENOL) 325 MG tablet Take 2 tablets (650 mg total) by mouth every 6 (six) hours as needed for mild pain, moderate pain or fever (or Fever >/= 101).    albuterol (PROVENTIL) (2.5 MG/3ML) 0.083% nebulizer solution Take 3 mLs (2.5 mg total) by  nebulization every 2 (two) hours as needed for wheezing. Qty: 75 mL, Refills: 12    Amino Acids-Protein Hydrolys (FEEDING SUPPLEMENT, PRO-STAT SUGAR FREE 64,) LIQD Take 30 mLs by mouth 2 (two) times daily between meals.    amiodarone (PACERONE) 200 MG tablet Take 1 tablet (200 mg total) by mouth 2 (two) times daily.    bisacodyl (DULCOLAX) 5 MG EC tablet Take 1 tablet (5 mg total) by mouth daily as needed for moderate constipation. Qty: 30 tablet, Refills: 0    Cholecalciferol (VITAMIN D-3) 1000 UNITS CAPS Take 1 capsule by mouth daily.     FLUoxetine (PROZAC) 40 MG capsule Take 1 capsule (40 mg total) by mouth daily. Qty: 90 capsule, Refills: 1    HYDROcodone-acetaminophen (NORCO/VICODIN) 5-325 MG tablet Take 1-2 tablets by mouth every 6 (six) hours as needed for moderate pain. Qty: 120 tablet, Refills: 0    lovastatin (MEVACOR) 20 MG tablet Take 1 tablet (20 mg total) by mouth daily. Qty: 90 tablet, Refills: 1    metoprolol succinate (TOPROL-XL) 50 MG 24 hr tablet TAKE (1) TABLET BY MOUTH DAILY. TAKE WITH OR IMMEDIATELY FOLLOWING A MEAL Qty: 30 tablet, Refills: 10    Nutritional Supplements (ENSURE ENLIVE PO) Take 1 Bottle by mouth 2 (two) times daily between meals.    senna-docusate (SENOKOT-S) 8.6-50 MG tablet Take 1 tablet by mouth at bedtime as needed for mild constipation.    torsemide (DEMADEX) 5 MG tablet Take 5 mg by mouth daily.    traZODone (DESYREL) 50 MG tablet Take 1 tablet (50 mg total) by mouth at bedtime as needed for sleep.      STOP taking these medications     cephALEXin (KEFLEX) 500 MG capsule           Today   CHIEF COMPLAINT:  No acute issues overnight. Patient is doing well.   VITAL SIGNS:  Blood pressure (!) 123/52, pulse 68, temperature 98.3 F (36.8 C), resp. rate 18, height 5\' 2"  (1.575 m), weight 76.5 kg (168 lb 11.2 oz), SpO2 97 %.   REVIEW OF SYSTEMS:  Review of Systems  Constitutional: Negative.  Negative for chills, fever and  malaise/fatigue.  HENT: Negative.  Negative for ear discharge, ear pain, hearing loss, nosebleeds and sore throat.   Eyes: Negative.  Negative for blurred vision and pain.  Respiratory: Negative.  Negative for cough, hemoptysis, shortness of breath and wheezing.   Cardiovascular: Negative.  Negative for chest pain, palpitations and leg swelling.  Gastrointestinal: Negative.  Negative for abdominal pain, blood in stool, diarrhea, nausea and vomiting.  Genitourinary: Negative.  Negative for dysuria.  Musculoskeletal: Negative.  Negative for back pain.  Skin: Negative.   Neurological: Negative for dizziness, tremors, speech change, focal weakness, seizures and headaches.  Endo/Heme/Allergies: Negative.  Does not bruise/bleed easily.  Psychiatric/Behavioral: Negative.  Negative for depression, hallucinations and suicidal ideas.     PHYSICAL EXAMINATION:  GENERAL:  83 y.o.-year-old patient lying in the bed with no acute distress.  NECK:  Supple, no jugular venous distention. No thyroid enlargement, no tenderness.  LUNGS: Normal breath sounds bilaterally, no wheezing,  rales,rhonchi  No use of accessory muscles of respiration.  CARDIOVASCULAR: S1, S2 normal. No murmurs, rubs, or gallops.  ABDOMEN: Soft, non-tender, non-distended. Bowel sounds present. No organomegaly or mass.  EXTREMITIES: No pedal edema, cyanosis, or clubbing.  PSYCHIATRIC: The patient is alert and oriented x 3.  SKIN: No obvious rash, lesion, or ulcer.   DATA REVIEW:   CBC  Recent Labs Lab 05/24/17 0609  WBC 7.7  HGB 8.4*  HCT 26.8*  PLT 300    Chemistries   Recent Labs Lab 05/23/17 0700 05/23/17 1455 05/24/17 0609  NA 134* 138 138  K 3.6 3.6 3.6  CL 100* 102 102  CO2 27 27 27   GLUCOSE 82 85 73  BUN 21* 19 20  CREATININE 1.12* 0.99 1.01*  CALCIUM 8.3* 8.8* 8.9  MG 1.6*  --   --   AST  --  24  --   ALT  --  16  --   ALKPHOS  --  97  --   BILITOT  --  0.8  --     Cardiac Enzymes  Recent  Labs Lab 05/19/17 1933  TROPONINI 0.05*    Microbiology Results  @MICRORSLT48 @  RADIOLOGY:  No results found.    Current Discharge Medication List    START taking these medications   Details  !! apixaban (ELIQUIS) 5 MG TABS tablet Take 2 tablets (10 mg total) by mouth 2 (two) times daily. Qty: 20 tablet, Refills: 0    !! apixaban (ELIQUIS) 5 MG TABS tablet Take 1 tablet (5 mg total) by mouth 2 (two) times daily. Qty: 60 tablet, Refills: 0    levofloxacin (LEVAQUIN) 500 MG tablet Take 1.5 tablets (750 mg total) by mouth every other day. Qty: 2 tablet, Refills: 0     !! - Potential duplicate medications found. Please discuss with provider.    CONTINUE these medications which have NOT CHANGED   Details  acetaminophen (TYLENOL) 325 MG tablet Take 2 tablets (650 mg total) by mouth every 6 (six) hours as needed for mild pain, moderate pain or fever (or Fever >/= 101).    albuterol (PROVENTIL) (2.5 MG/3ML) 0.083% nebulizer solution Take 3 mLs (2.5 mg total) by nebulization every 2 (two) hours as needed for wheezing. Qty: 75 mL, Refills: 12    Amino Acids-Protein Hydrolys (FEEDING SUPPLEMENT, PRO-STAT SUGAR FREE 64,) LIQD Take 30 mLs by mouth 2 (two) times daily between meals.    amiodarone (PACERONE) 200 MG tablet Take 1 tablet (200 mg total) by mouth 2 (two) times daily.    bisacodyl (DULCOLAX) 5 MG EC tablet Take 1 tablet (5 mg total) by mouth daily as needed for moderate constipation. Qty: 30 tablet, Refills: 0    Cholecalciferol (VITAMIN D-3) 1000 UNITS CAPS Take 1 capsule by mouth daily.     FLUoxetine (PROZAC) 40 MG capsule Take 1 capsule (40 mg total) by mouth daily. Qty: 90 capsule, Refills: 1    HYDROcodone-acetaminophen (NORCO/VICODIN) 5-325 MG tablet Take 1-2 tablets by mouth every 6 (six) hours as needed for moderate pain. Qty: 120 tablet, Refills: 0    lovastatin (MEVACOR) 20 MG tablet Take 1 tablet (20 mg total) by mouth daily. Qty: 90 tablet, Refills: 1     metoprolol succinate (TOPROL-XL) 50 MG 24 hr tablet TAKE (1) TABLET BY MOUTH DAILY. TAKE WITH OR IMMEDIATELY FOLLOWING A MEAL Qty: 30 tablet, Refills: 10    Nutritional Supplements (ENSURE ENLIVE PO) Take 1 Bottle by mouth 2 (two) times daily between meals.  senna-docusate (SENOKOT-S) 8.6-50 MG tablet Take 1 tablet by mouth at bedtime as needed for mild constipation.    torsemide (DEMADEX) 5 MG tablet Take 5 mg by mouth daily.    traZODone (DESYREL) 50 MG tablet Take 1 tablet (50 mg total) by mouth at bedtime as needed for sleep.      STOP taking these medications     cephALEXin (KEFLEX) 500 MG capsule            Management plans discussed with the patient and she is in agreement. Stable for discharge   Patient should follow up with pcp  CODE STATUS:     Code Status Orders        Start     Ordered   05/20/17 0251  Full code  Continuous     05/20/17 0250    Code Status History    Date Active Date Inactive Code Status Order ID Comments User Context   04/18/2017 10:43 AM 04/28/2017  8:09 PM Full Code [id number]  Demetrios Loll, MD Inpatient   01/27/2017 11:18 PM 01/31/2017  2:31 PM Full Code [id number]  Harvie Bridge, DO Inpatient   12/08/2016 10:14 PM 12/15/2016  3:09 PM Full Code [id number]  Demetrios Loll, MD ED      TOTAL TIME TAKING CARE OF THIS PATIENT: 38 minutes.   D/w daughter  Note: This dictation was prepared with Dragon dictation along with smaller phrase technology. Any transcriptional errors that result from this process are unintentional.  Rhyatt Muska M.D on 05/24/2017 at 8:29 AM  Between 7am to 6pm - Pager - [phone] After 6pm go to [email or web address] - password [email or web address]  Office  [phone]  CC: Primary care physician; Einar Pheasant, MD

## 2017-05-24 NOTE — Telephone Encounter (Signed)
Reviewed hospital records given daughter's concern regarding being on eliquis.  Given her recent diagnosis of a clot and question of pulmonary embolus, they recommended her restarting blood thinner.  Given recent clot issues, I would like to refer her to hematology for further evaluation and question of need for long term anticoagulation.  They may help answer questions regarding long term anticoagulation.   If agreeable to referral, let me know and I will place the order.

## 2017-05-24 NOTE — Care Management Important Message (Signed)
Important Message  Patient Details  Name: Kristin Avila MRN: [id number] Date of Birth: [birth year]   Medicare Important Message Given:  Yes    Beverly Sessions, RN 05/24/2017, 12:24 PM

## 2017-05-24 NOTE — Clinical Social Work Note (Signed)
CSW received referral for SNF.  Case discussed with case manager and plan is to discharge home with home health.  CSW to sign off please re-consult if social work needs arise.  Ramiel Forti R. Korde Jeppsen, MSW, LCSWA [phone]  

## 2017-05-24 NOTE — Discharge Instructions (Signed)
Information on my medicine - ELIQUIS (apixaban)  This medication education was reviewed with me or my healthcare representative as part of my discharge preparation.  The pharmacist that spoke with me during my hospital stay was:  Candelaria Stagers, [facility]  Why was Eliquis prescribed for you? Eliquis was prescribed to treat blood clots that may have been found in the veins of your legs (deep vein thrombosis) or in your lungs (pulmonary embolism) and to reduce the risk of them occurring again.  What do You need to know about Eliquis ? The starting dose is 10 mg (two 5 mg tablets) taken TWICE daily for the FIRST SEVEN (7) DAYS, then on (enter date)  May 30, 2017  the dose is reduced to ONE 5 mg tablet taken TWICE daily.  Eliquis may be taken with or without food.   Try to take the dose about the same time in the morning and in the evening. If you have difficulty swallowing the tablet whole please discuss with your pharmacist how to take the medication safely.  Take Eliquis exactly as prescribed and DO NOT stop taking Eliquis without talking to the doctor who prescribed the medication.  Stopping may increase your risk of developing a new blood clot.  Refill your prescription before you run out.  After discharge, you should have regular check-up appointments with your healthcare provider that is prescribing your Eliquis.    What do you do if you miss a dose? If a dose of ELIQUIS is not taken at the scheduled time, take it as soon as possible on the same day and twice-daily administration should be resumed. The dose should not be doubled to make up for a missed dose.  Important Safety Information A possible side effect of Eliquis is bleeding. You should call your healthcare provider right away if you experience any of the following: ? Bleeding from an injury or your nose that does not stop. ? Unusual colored urine (red or dark brown) or unusual colored stools (red or black). ? Unusual  bruising for unknown reasons. ? A serious fall or if you hit your head (even if there is no bleeding).  Some medicines may interact with Eliquis and might increase your risk of bleeding or clotting while on Eliquis. To help avoid this, consult your healthcare provider or pharmacist prior to using any new prescription or non-prescription medications, including herbals, vitamins, non-steroidal anti-inflammatory drugs (NSAIDs) and supplements.  This website has more information on Eliquis (apixaban): http://www.eliquis.com/eliquis/home

## 2017-05-25 LAB — CULTURE, BLOOD (ROUTINE X 2)
CULTURE: NO GROWTH
Culture: NO GROWTH

## 2017-05-25 NOTE — Telephone Encounter (Signed)
Order placed for hematology referral.  

## 2017-05-25 NOTE — Telephone Encounter (Signed)
Notified patient daughter whom is patient POA, and they are in agreement to the referral to hematology.

## 2017-05-28 ENCOUNTER — Encounter: Payer: Self-pay | Admitting: Internal Medicine

## 2017-05-28 ENCOUNTER — Ambulatory Visit (INDEPENDENT_AMBULATORY_CARE_PROVIDER_SITE_OTHER): Payer: Medicare Other | Admitting: Internal Medicine

## 2017-05-28 ENCOUNTER — Ambulatory Visit: Payer: Self-pay | Admitting: Cardiothoracic Surgery

## 2017-05-28 VITALS — BP 118/70 | HR 78 | Temp 98.6°F | Ht 62.0 in | Wt 176.8 lb

## 2017-05-28 DIAGNOSIS — I495 Sick sinus syndrome: Secondary | ICD-10-CM

## 2017-05-28 DIAGNOSIS — G479 Sleep disorder, unspecified: Secondary | ICD-10-CM

## 2017-05-28 DIAGNOSIS — I82629 Acute embolism and thrombosis of deep veins of unspecified upper extremity: Secondary | ICD-10-CM | POA: Diagnosis not present

## 2017-05-28 DIAGNOSIS — I4891 Unspecified atrial fibrillation: Secondary | ICD-10-CM

## 2017-05-28 DIAGNOSIS — E7800 Pure hypercholesterolemia, unspecified: Secondary | ICD-10-CM

## 2017-05-28 DIAGNOSIS — I10 Essential (primary) hypertension: Secondary | ICD-10-CM

## 2017-05-28 DIAGNOSIS — D509 Iron deficiency anemia, unspecified: Secondary | ICD-10-CM

## 2017-05-28 LAB — BASIC METABOLIC PANEL
BUN/Creatinine Ratio: 20 (calc) (ref 6–22)
BUN: 21 mg/dL (ref 7–25)
CALCIUM: 9 mg/dL (ref 8.6–10.4)
CO2: 26 mmol/L (ref 20–32)
Chloride: 102 mmol/L (ref 98–110)
Creat: 1.07 mg/dL — ABNORMAL HIGH (ref 0.60–0.88)
Glucose, Bld: 105 mg/dL — ABNORMAL HIGH (ref 65–99)
POTASSIUM: 4.5 mmol/L (ref 3.5–5.3)
SODIUM: 138 mmol/L (ref 135–146)

## 2017-05-28 LAB — CBC WITH DIFFERENTIAL/PLATELET
Basophils Absolute: 30 cells/uL (ref 0–200)
Basophils Relative: 0.4 %
EOS ABS: 122 {cells}/uL (ref 15–500)
Eosinophils Relative: 1.6 %
HEMATOCRIT: 25.4 % — AB (ref 35.0–45.0)
HEMOGLOBIN: 7.6 g/dL — AB (ref 11.7–15.5)
Lymphs Abs: 920 cells/uL (ref 850–3900)
MCH: 23.2 pg — AB (ref 27.0–33.0)
MCHC: 29.9 g/dL — ABNORMAL LOW (ref 32.0–36.0)
MCV: 77.7 fL — AB (ref 80.0–100.0)
MPV: 10.1 fL (ref 7.5–12.5)
Monocytes Relative: 11.6 %
NEUTROS ABS: 5647 {cells}/uL (ref 1500–7800)
Neutrophils Relative %: 74.3 %
Platelets: 331 10*3/uL (ref 140–400)
RBC: 3.27 10*6/uL — ABNORMAL LOW (ref 3.80–5.10)
RDW: 17.1 % — ABNORMAL HIGH (ref 11.0–15.0)
TOTAL LYMPHOCYTE: 12.1 %
WBC: 7.6 10*3/uL (ref 3.8–10.8)
WBCMIX: 882 {cells}/uL (ref 200–950)

## 2017-05-28 LAB — HEPATIC FUNCTION PANEL
AG RATIO: 1 (calc) (ref 1.0–2.5)
ALT: 15 U/L (ref 6–29)
AST: 25 U/L (ref 10–35)
Albumin: 3 g/dL — ABNORMAL LOW (ref 3.6–5.1)
Alkaline phosphatase (APISO): 107 U/L (ref 33–130)
BILIRUBIN DIRECT: 0.2 mg/dL (ref 0.0–0.2)
BILIRUBIN INDIRECT: 0.3 mg/dL (ref 0.2–1.2)
BILIRUBIN TOTAL: 0.5 mg/dL (ref 0.2–1.2)
Globulin: 2.9 g/dL (calc) (ref 1.9–3.7)
Total Protein: 5.9 g/dL — ABNORMAL LOW (ref 6.1–8.1)

## 2017-05-28 NOTE — Progress Notes (Signed)
Patient ID: Kristin Avila, female   DOB: Nov 02, 1927, 83 y.o.   MRN: [id number]   Subjective:    Patient ID: Kristin Avila, female    DOB: 1928/07/04, 83 y.o.   MRN: [id number]  HPI  Patient here for hospital follow up.  She was admitted 05/19/17 and diagnosed with edema, hospital acquired pneumonia and DVT left upper extremity.  Concern over possible PE.  Radiology read CT as pulmonary emboli.  Vascular surgery did not feel had pulmonary embolus.  She is on eliquis.  Daughter had questions about being on blood thinner.  Previous pneumothorax/ hemothorax.  Saw Dr Faith Rogue.  She was also worked up in the hospital for microcytic anemia.  Had colonoscopy and EGD.  No source of bleeding found.  She was discharged with instructions to f/u with cardiology in one week.  Pneumonia was treated.  She comes in today accompanied by her daughter.  History obtained from both of them.  They report she has just started working with physical therapy.  Needs assistance.  No increased sob.  She is eating.  Some decreased appetite.  No increased cough or congestion.  No blood in her stool.  Concern regarding decreasing hgb.  Was decreasing in hospital.  Will need to recheck.     Past Medical History:  Diagnosis Date  . Anemia   . Anxiety 10/02/2012  . Atrial fibrillation ([place]) 11/13   new onset 11/13  . BCC (basal cell carcinoma), face 04/24/2016  . Benign essential hypertension 03/13/2015  . Cellulitis of right upper extremity 01/27/2017  . Constipation 01/13/2015  . Decreased hearing 05/21/2015  . Health care maintenance 10/14/2014   Mammogram 10/01/15 - Birads I.    . Hypercholesterolemia   . Hyperlipidemia, mixed 02/01/2017  . Hypertension   . Osteoarthritis    hands, feet  . Sick sinus syndrome ([place]) 01/04/2017   Overview:  Sp pacer placement 2018  . Syncope 12/08/2016   Past Surgical History:  Procedure Laterality Date  . APPENDECTOMY  1957  . CATARACT EXTRACTION  2013   left eye  . COLONOSCOPY   09/06/2008  . COLONOSCOPY WITH PROPOFOL N/A 05/23/2017   Procedure: COLONOSCOPY WITH PROPOFOL;  Surgeon: Lucilla Lame, MD;  [facility] ENDOSCOPY;  Service: Endoscopy;  Laterality: N/A;  . ESOPHAGOGASTRODUODENOSCOPY (EGD) WITH PROPOFOL N/A 05/23/2017   Procedure: ESOPHAGOGASTRODUODENOSCOPY (EGD) WITH PROPOFOL;  Surgeon: Lucilla Lame, MD;  Location: [facility] ENDOSCOPY;  Service: Endoscopy;  Laterality: N/A;  . INCISION AND DRAINAGE ABSCESS Right 01/29/2017   Procedure: INCISION AND DRAINAGE ABSCESS;  Surgeon: Florene Glen, MD;  Location: [facility] ORS;  Service: General;  Laterality: Right;  . KIDNEY SURGERY  age 45  . PACEMAKER INSERTION Left 12/14/2016   Procedure: INSERTION PACEMAKER;  Surgeon: Isaias Cowman, MD;  Location: [facility] ORS;  Service: Cardiovascular;  Laterality: Left;  . TUBAL LIGATION    . VIDEO ASSISTED THORACOSCOPY (VATS)/THOROCOTOMY Right 04/19/2017   Procedure: RIGHT THORACOSCOPY AND EVACUATION OF HEMOTHORAX;  Surgeon: Nestor Lewandowsky, MD;  Location: [facility] ORS;  Service: General;  Laterality: Right;   Family History  Problem Relation Age of Onset  . Congestive Heart Failure Father   . Multiple myeloma Mother   . Hypertension Brother   . Alzheimer's disease Unknown        grandmother and great aunts  . Breast cancer Neg Hx   . Colon cancer Neg Hx    Social History   Social History  . Marital status: Widowed    Spouse name: N/A  .  Number of children: 4  . Years of education: N/A   Social History Main Topics  . Smoking status: Never Smoker  . Smokeless tobacco: Never Used  . Alcohol use 0.0 oz/week     Comment: occasional  . Drug use: No  . Sexual activity: Not Asked   Other Topics Concern  . None   Social History Narrative   Widowed   4 children   Never smoker   Occasionally drinks   Full Code    Outpatient Encounter Prescriptions as of 05/28/2017  Medication Sig  . acetaminophen (TYLENOL) 325 MG tablet Take 2 tablets (650 mg total) by mouth every 6  (six) hours as needed for mild pain, moderate pain or fever (or Fever >/= 101).  Marland Kitchen albuterol (PROVENTIL) (2.5 MG/3ML) 0.083% nebulizer solution Take 3 mLs (2.5 mg total) by nebulization every 2 (two) hours as needed for wheezing.  Marland Kitchen amiodarone (PACERONE) 200 MG tablet Take 1 tablet (200 mg total) by mouth 2 (two) times daily.  Marland Kitchen apixaban (ELIQUIS) 5 MG TABS tablet Take 2 tablets (10 mg total) by mouth 2 (two) times daily.  Derrill Memo ON 05/30/2017] apixaban (ELIQUIS) 5 MG TABS tablet Take 1 tablet (5 mg total) by mouth 2 (two) times daily.  . bisacodyl (DULCOLAX) 5 MG EC tablet Take 1 tablet (5 mg total) by mouth daily as needed for moderate constipation.  . Cholecalciferol (VITAMIN D-3) 1000 UNITS CAPS Take 1 capsule by mouth daily.   Marland Kitchen FLUoxetine (PROZAC) 40 MG capsule Take 1 capsule (40 mg total) by mouth daily.  Marland Kitchen HYDROcodone-acetaminophen (NORCO/VICODIN) 5-325 MG tablet Take 1-2 tablets by mouth every 6 (six) hours as needed for moderate pain.  Marland Kitchen lovastatin (MEVACOR) 20 MG tablet Take 1 tablet (20 mg total) by mouth daily.  . metoprolol succinate (TOPROL-XL) 50 MG 24 hr tablet TAKE (1) TABLET BY MOUTH DAILY. TAKE WITH OR IMMEDIATELY FOLLOWING A MEAL  . Nutritional Supplements (ENSURE ENLIVE PO) Take 1 Bottle by mouth 2 (two) times daily between meals.  . senna-docusate (SENOKOT-S) 8.6-50 MG tablet Take 1 tablet by mouth at bedtime as needed for mild constipation.  . torsemide (DEMADEX) 5 MG tablet Take 5 mg by mouth daily.  . traZODone (DESYREL) 50 MG tablet Take 1 tablet (50 mg total) by mouth at bedtime as needed for sleep.  . [DISCONTINUED] Amino Acids-Protein Hydrolys (FEEDING SUPPLEMENT, PRO-STAT SUGAR FREE 64,) LIQD Take 30 mLs by mouth 2 (two) times daily between meals.  . [DISCONTINUED] levofloxacin (LEVAQUIN) 500 MG tablet Take 1.5 tablets (750 mg total) by mouth every other day.   No facility-administered encounter medications on file as of 05/28/2017.     Review of Systems    Constitutional: Positive for fatigue.       Decreased appetite.  She is eating some better.    HENT: Negative for congestion and sinus pressure.   Respiratory: Negative for cough and chest tightness.        Breathing stable.   Cardiovascular: Negative for chest pain and palpitations.  Gastrointestinal: Negative for abdominal pain, diarrhea, nausea and vomiting.       Has not noticed blood in her stool    Genitourinary: Negative for difficulty urinating and dysuria.  Musculoskeletal: Negative for joint swelling and myalgias.  Skin: Negative for color change and rash.  Neurological: Negative for dizziness and headaches.  Psychiatric/Behavioral: Negative for agitation and dysphoric mood.       Objective:    Physical Exam  Constitutional: She appears well-developed and  well-nourished.  HENT:  Nose: Nose normal.  Mouth/Throat: Oropharynx is clear and moist.  Neck: Neck supple.  Cardiovascular: Normal rate and regular rhythm.   Pulmonary/Chest: No respiratory distress. She has no wheezes.  Some diminished breath sounds at base.  No wheezing.   Abdominal: Soft. Bowel sounds are normal. There is no tenderness.  Musculoskeletal: She exhibits no tenderness.  Positive minimal ankle edema.    Lymphadenopathy:    She has no cervical adenopathy.  Skin: No rash noted. No erythema.  Psychiatric: She has a normal mood and affect. Her behavior is normal.    BP 118/70 (BP Location: Left Arm, Patient Position: Sitting, Cuff Size: Large)   Pulse 78   Temp 98.6 F (37 C) (Oral)   Ht '5\' 2"'$  (1.575 m)   Wt 176 lb 12.8 oz (80.2 kg)   SpO2 95%   BMI 32.34 kg/m  Wt Readings from Last 3 Encounters:  05/28/17 176 lb 12.8 oz (80.2 kg)  05/24/17 168 lb 11.2 oz (76.5 kg)  05/19/17 182 lb 4.8 oz (82.7 kg)     Lab Results  Component Value Date   WBC 7.6 05/28/2017   HGB 7.6 (L) 05/28/2017   HCT 25.4 (L) 05/28/2017   PLT 331 05/28/2017   GLUCOSE 105 (H) 05/28/2017   CHOL 219 (H) 11/26/2016    TRIG 68.0 11/26/2016   HDL 91.90 11/26/2016   LDLDIRECT 105.8 09/06/2013   LDLCALC 113 (H) 11/26/2016   ALT 15 05/28/2017   AST 25 05/28/2017   NA 138 05/28/2017   K 4.5 05/28/2017   CL 102 05/28/2017   CREATININE 1.07 (H) 05/28/2017   BUN 21 05/28/2017   CO2 26 05/28/2017   TSH 2.822 04/30/2017   INR 1.17 05/19/2017       Assessment & Plan:   Problem List Items Addressed This Visit    Atrial fibrillation ([place])    On amidarone and metoprolol.  Rate controlled.  On eliquis.  Follow.        DVT (deep venous thrombosis) ([place])    Found to have left upper extremity DVT.  Evaluated by vascular surgery.  Radiology concern regarding PE.  Vascular did not feel had PE.  On eliquis.  Has appt with hematology for further w/up and evaluation and question of need for long term anticoagulation.        Hypercholesterolemia    On lovastatin.  Follow lipid panel and liver function tests.        Hypertension    Blood pressure under good control.  Continue same medication regimen.  Follow pressures.  Follow metabolic panel.        Relevant Orders   Hepatic function panel (Completed)   Basic Metabolic Panel (BMET) (Completed)   Iron deficiency anemia - Primary    Has a documented history of iron deficient anemia.  Had colonoscopy and EGD in hospital.  No source of bleeding found.  hgb decreasing on recent checks in hospital.  Concerning given need for anticoagulation.  Recheck cbc.        Relevant Orders   CBC w/Diff (Completed)   Sick sinus syndrome [facility])    S/p pacemaker placement.  Followed by cardiology.        Sleeping difficulty    Has been on trazodone.  Daughter reports did well on '100mg'$  q hs.  Only on '50mg'$  now and not sleeping well.  Can increase to '100mg'$  q hs since has taken and tolerated.  Follow closely.  Einar Pheasant, MD

## 2017-05-29 ENCOUNTER — Observation Stay
Admission: AD | Admit: 2017-05-29 | Discharge: 2017-05-30 | Disposition: A | Discharge status: Home / Self Care | Payer: Medicare Other | Source: Ambulatory Visit | Attending: Internal Medicine | Admitting: Internal Medicine

## 2017-05-29 ENCOUNTER — Encounter: Payer: Self-pay | Admitting: Internal Medicine

## 2017-05-29 DIAGNOSIS — I4891 Unspecified atrial fibrillation: Secondary | ICD-10-CM | POA: Insufficient documentation

## 2017-05-29 DIAGNOSIS — F419 Anxiety disorder, unspecified: Secondary | ICD-10-CM | POA: Insufficient documentation

## 2017-05-29 DIAGNOSIS — D649 Anemia, unspecified: Principal | ICD-10-CM

## 2017-05-29 DIAGNOSIS — Z950 Presence of cardiac pacemaker: Secondary | ICD-10-CM | POA: Insufficient documentation

## 2017-05-29 DIAGNOSIS — F329 Major depressive disorder, single episode, unspecified: Secondary | ICD-10-CM | POA: Diagnosis not present

## 2017-05-29 DIAGNOSIS — Z85828 Personal history of other malignant neoplasm of skin: Secondary | ICD-10-CM | POA: Diagnosis not present

## 2017-05-29 DIAGNOSIS — Z79899 Other long term (current) drug therapy: Secondary | ICD-10-CM | POA: Insufficient documentation

## 2017-05-29 DIAGNOSIS — I10 Essential (primary) hypertension: Secondary | ICD-10-CM | POA: Insufficient documentation

## 2017-05-29 DIAGNOSIS — G479 Sleep disorder, unspecified: Secondary | ICD-10-CM | POA: Insufficient documentation

## 2017-05-29 DIAGNOSIS — Z8701 Personal history of pneumonia (recurrent): Secondary | ICD-10-CM | POA: Diagnosis not present

## 2017-05-29 DIAGNOSIS — Z86718 Personal history of other venous thrombosis and embolism: Secondary | ICD-10-CM | POA: Insufficient documentation

## 2017-05-29 DIAGNOSIS — Z7901 Long term (current) use of anticoagulants: Secondary | ICD-10-CM | POA: Insufficient documentation

## 2017-05-29 DIAGNOSIS — E782 Mixed hyperlipidemia: Secondary | ICD-10-CM | POA: Insufficient documentation

## 2017-05-29 LAB — BASIC METABOLIC PANEL
Anion gap: 8 (ref 5–15)
BUN: 23 mg/dL — AB (ref 6–20)
CO2: 30 mmol/L (ref 22–32)
CREATININE: 1.13 mg/dL — AB (ref 0.44–1.00)
Calcium: 8.9 mg/dL (ref 8.9–10.3)
Chloride: 100 mmol/L — ABNORMAL LOW (ref 101–111)
GFR, EST AFRICAN AMERICAN: 50 mL/min — AB (ref >60)
GFR, EST NON AFRICAN AMERICAN: 43 mL/min — AB (ref >60)
Glucose, Bld: 104 mg/dL — ABNORMAL HIGH (ref 65–99)
Potassium: 3.7 mmol/L (ref 3.5–5.1)
SODIUM: 138 mmol/L (ref 135–145)

## 2017-05-29 LAB — PREPARE RBC (CROSSMATCH)

## 2017-05-29 LAB — CBC
HCT: 25.4 % — ABNORMAL LOW (ref 35.0–47.0)
Hemoglobin: 8 g/dL — ABNORMAL LOW (ref 12.0–16.0)
MCH: 23.7 pg — ABNORMAL LOW (ref 26.0–34.0)
MCHC: 31.5 g/dL — AB (ref 32.0–36.0)
MCV: 75.2 fL — ABNORMAL LOW (ref 80.0–100.0)
Platelets: 319 10*3/uL (ref 150–440)
RBC: 3.38 MIL/uL — ABNORMAL LOW (ref 3.80–5.20)
RDW: 20.1 % — ABNORMAL HIGH (ref 11.5–14.5)
WBC: 6.7 10*3/uL (ref 3.6–11.0)

## 2017-05-29 MED ORDER — DOCUSATE SODIUM 100 MG PO CAPS: 100.0000 mg | ORAL_CAPSULE | Freq: Two times a day (BID) | ORAL | Status: DC | PRN

## 2017-05-29 MED ORDER — VITAMIN D3 25 MCG (1000 UNIT) PO TABS
1000.0000 [IU] | ORAL_TABLET | Freq: Every day | ORAL | Status: DC
  Administered 2017-05-30: 1000 [IU] via ORAL
  Filled 2017-05-29 (×4): qty 1

## 2017-05-29 MED ORDER — FLUOXETINE HCL 20 MG PO CAPS
40.0000 mg | ORAL_CAPSULE | Freq: Every day | ORAL | Status: DC
  Administered 2017-05-30: 40 mg via ORAL
  Filled 2017-05-29: qty 2

## 2017-05-29 MED ORDER — TORSEMIDE 5 MG PO TABS
5.0000 mg | ORAL_TABLET | Freq: Every day | ORAL | Status: DC
  Administered 2017-05-30: 5 mg via ORAL
  Filled 2017-05-29: qty 1

## 2017-05-29 MED ORDER — SENNOSIDES-DOCUSATE SODIUM 8.6-50 MG PO TABS: 1.0000 | ORAL_TABLET | Freq: Every evening | ORAL | Status: DC | PRN

## 2017-05-29 MED ORDER — APIXABAN 5 MG PO TABS
10.0000 mg | ORAL_TABLET | Freq: Two times a day (BID) | ORAL | Status: AC
  Administered 2017-05-29: 5 mg via ORAL
  Filled 2017-05-29: qty 2

## 2017-05-29 MED ORDER — FERROUS SULFATE 325 (65 FE) MG PO TABS
325.0000 mg | ORAL_TABLET | Freq: Two times a day (BID) | ORAL | Status: DC
  Administered 2017-05-29 – 2017-05-30 (×2): 325 mg via ORAL
  Filled 2017-05-29 (×2): qty 1

## 2017-05-29 MED ORDER — ALBUTEROL SULFATE (2.5 MG/3ML) 0.083% IN NEBU: 2.5000 mg | INHALATION_SOLUTION | RESPIRATORY_TRACT | Status: DC | PRN

## 2017-05-29 MED ORDER — SODIUM CHLORIDE 0.9 % IV SOLN
Freq: Once | INTRAVENOUS | Status: AC
  Administered 2017-05-29: 20:00:00 via INTRAVENOUS

## 2017-05-29 MED ORDER — PRAVASTATIN SODIUM 20 MG PO TABS
20.0000 mg | ORAL_TABLET | Freq: Every day | ORAL | Status: DC
  Administered 2017-05-29: 20 mg via ORAL
  Filled 2017-05-29: qty 1

## 2017-05-29 MED ORDER — ENSURE ENLIVE PO LIQD: 237.0000 mL | Freq: Two times a day (BID) | ORAL | Status: DC

## 2017-05-29 MED ORDER — APIXABAN 5 MG PO TABS
5.0000 mg | ORAL_TABLET | Freq: Two times a day (BID) | ORAL | Status: DC
  Administered 2017-05-30: 5 mg via ORAL
  Filled 2017-05-29: qty 1

## 2017-05-29 MED ORDER — HYDROCODONE-ACETAMINOPHEN 5-325 MG PO TABS: 1.0000 | ORAL_TABLET | Freq: Four times a day (QID) | ORAL | Status: DC | PRN

## 2017-05-29 MED ORDER — METOPROLOL SUCCINATE ER 25 MG PO TB24
25.0000 mg | ORAL_TABLET | Freq: Every day | ORAL | Status: DC
Start: 2017-05-30 — End: 2017-05-30
  Administered 2017-05-30: 25 mg via ORAL
  Filled 2017-05-29: qty 1

## 2017-05-29 MED ORDER — TRAZODONE HCL 50 MG PO TABS
50.0000 mg | ORAL_TABLET | Freq: Every evening | ORAL | Status: DC | PRN
  Administered 2017-05-29: 50 mg via ORAL
  Filled 2017-05-29: qty 1

## 2017-05-29 MED ORDER — BISACODYL 5 MG PO TBEC: 5.0000 mg | DELAYED_RELEASE_TABLET | Freq: Every day | ORAL | Status: DC | PRN

## 2017-05-29 MED ORDER — AMIODARONE HCL 200 MG PO TABS
200.0000 mg | ORAL_TABLET | Freq: Two times a day (BID) | ORAL | Status: DC
  Administered 2017-05-29 – 2017-05-30 (×2): 200 mg via ORAL
  Filled 2017-05-29 (×2): qty 1

## 2017-05-29 NOTE — H&P (Signed)
[facility] NAME: Kristin Avila    MR#:  [id number]  DATE OF BIRTH:  03-01-1928  DATE OF ADMISSION:  05/29/2017  PRIMARY CARE PHYSICIAN: Einar Pheasant, MD   REQUESTING/REFERRING PHYSICIAN: Einar Pheasant  CHIEF COMPLAINT:  No chief complaint on file.   HISTORY OF PRESENT ILLNESS: Kristin Avila  is a 83 y.o. female with a known history of A fib, Htn, HLP, was here last week for Pneumonia and had LUE DVT and started on Eliquis after checking Endo and colonoscopy, as she have anemia- Noted no source- started Eliquis and sent home. This is 3-4 admission in last 4-5 months. Have generalized weakness, sent home with PT. Need support. Went to PMD yesterday, her Hb had dropped a light from d/c. She already had extensive work up last admission, but due to her symptoms, her PMD is worried, and sent in for direct admission for blood transfusion. Denies any new complains.  PAST MEDICAL HISTORY:   Past Medical History:  Diagnosis Date  . Anemia   . Anxiety 10/02/2012  . Atrial fibrillation ([place]) 11/13   new onset 11/13  . BCC (basal cell carcinoma), face 04/24/2016  . Benign essential hypertension 03/13/2015  . Cellulitis of right upper extremity 01/27/2017  . Constipation 01/13/2015  . Decreased hearing 05/21/2015  . Health care maintenance 10/14/2014   Mammogram 10/01/15 - Birads I.    . Hypercholesterolemia   . Hyperlipidemia, mixed 02/01/2017  . Hypertension   . Osteoarthritis    hands, feet  . Sick sinus syndrome ([place]) 01/04/2017   Overview:  Sp pacer placement 2018  . Syncope 12/08/2016    PAST SURGICAL HISTORY: Past Surgical History:  Procedure Laterality Date  . APPENDECTOMY  1957  . CATARACT EXTRACTION  2013   left eye  . COLONOSCOPY  09/06/2008  . COLONOSCOPY WITH PROPOFOL N/A 05/23/2017   Procedure: COLONOSCOPY WITH PROPOFOL;  Surgeon: Lucilla Lame, MD;  [facility] ENDOSCOPY;  Service: Endoscopy;  Laterality: N/A;  .  ESOPHAGOGASTRODUODENOSCOPY (EGD) WITH PROPOFOL N/A 05/23/2017   Procedure: ESOPHAGOGASTRODUODENOSCOPY (EGD) WITH PROPOFOL;  Surgeon: Lucilla Lame, MD;  Location: [facility] ENDOSCOPY;  Service: Endoscopy;  Laterality: N/A;  . INCISION AND DRAINAGE ABSCESS Right 01/29/2017   Procedure: INCISION AND DRAINAGE ABSCESS;  Surgeon: Florene Glen, MD;  Location: [facility] ORS;  Service: General;  Laterality: Right;  . KIDNEY SURGERY  age 49  . PACEMAKER INSERTION Left 12/14/2016   Procedure: INSERTION PACEMAKER;  Surgeon: Isaias Cowman, MD;  Location: [facility] ORS;  Service: Cardiovascular;  Laterality: Left;  . TUBAL LIGATION    . VIDEO ASSISTED THORACOSCOPY (VATS)/THOROCOTOMY Right 04/19/2017   Procedure: RIGHT THORACOSCOPY AND EVACUATION OF HEMOTHORAX;  Surgeon: Nestor Lewandowsky, MD;  Location: [facility] ORS;  Service: General;  Laterality: Right;    SOCIAL HISTORY:  Social History  Substance Use Topics  . Smoking status: Never Smoker  . Smokeless tobacco: Never Used  . Alcohol use 0.0 oz/week     Comment: occasional    FAMILY HISTORY:  Family History  Problem Relation Age of Onset  . Congestive Heart Failure Father   . Multiple myeloma Mother   . Hypertension Brother   . Alzheimer's disease Unknown        grandmother and great aunts  . Breast cancer Neg Hx   . Colon cancer Neg Hx     DRUG ALLERGIES: No Known Allergies  REVIEW OF SYSTEMS:   CONSTITUTIONAL: No fever, positive for fatigue or weakness.  EYES:  No blurred or double vision.  EARS, NOSE, AND THROAT: No tinnitus or ear pain.  RESPIRATORY: No cough, shortness of breath, wheezing or hemoptysis.  CARDIOVASCULAR: No chest pain, orthopnea, edema.  GASTROINTESTINAL: No nausea, vomiting, diarrhea or abdominal pain.  GENITOURINARY: No dysuria, hematuria.  ENDOCRINE: No polyuria, nocturia,  HEMATOLOGY: No anemia, easy bruising or bleeding SKIN: No rash or lesion. MUSCULOSKELETAL: No joint pain or arthritis.   NEUROLOGIC: No tingling,  numbness, weakness.  PSYCHIATRY: No anxiety or depression.   MEDICATIONS AT HOME:  Prior to Admission medications   Medication Sig Start Date End Date Taking? Authorizing Provider  acetaminophen (TYLENOL) 325 MG tablet Take 2 tablets (650 mg total) by mouth every 6 (six) hours as needed for mild pain, moderate pain or fever (or Fever >/= 101). 12/11/16   Gouru, Illene Silver, MD  albuterol (PROVENTIL) (2.5 MG/3ML) 0.083% nebulizer solution Take 3 mLs (2.5 mg total) by nebulization every 2 (two) hours as needed for wheezing. 04/28/17   Nicholes Mango, MD  amiodarone (PACERONE) 200 MG tablet Take 1 tablet (200 mg total) by mouth 2 (two) times daily. 04/28/17   Nicholes Mango, MD  apixaban (ELIQUIS) 5 MG TABS tablet Take 2 tablets (10 mg total) by mouth 2 (two) times daily. 05/24/17 05/29/17  Bettey Costa, MD  apixaban (ELIQUIS) 5 MG TABS tablet Take 1 tablet (5 mg total) by mouth 2 (two) times daily. 05/30/17   Bettey Costa, MD  bisacodyl (DULCOLAX) 5 MG EC tablet Take 1 tablet (5 mg total) by mouth daily as needed for moderate constipation. 04/28/17   Nicholes Mango, MD  Cholecalciferol (VITAMIN D-3) 1000 UNITS CAPS Take 1 capsule by mouth daily.     [provider]  FLUoxetine (PROZAC) 40 MG capsule Take 1 capsule (40 mg total) by mouth daily. 01/18/17   Einar Pheasant, MD  HYDROcodone-acetaminophen (NORCO/VICODIN) 5-325 MG tablet Take 1-2 tablets by mouth every 6 (six) hours as needed for moderate pain. 04/29/17   Toni Arthurs, NP  lovastatin (MEVACOR) 20 MG tablet Take 1 tablet (20 mg total) by mouth daily. 01/18/17   Einar Pheasant, MD  metoprolol succinate (TOPROL-XL) 50 MG 24 hr tablet TAKE (1) TABLET BY MOUTH DAILY. TAKE WITH OR IMMEDIATELY FOLLOWING A MEAL 01/19/17   Einar Pheasant, MD  Nutritional Supplements (ENSURE ENLIVE PO) Take 1 Bottle by mouth 2 (two) times daily between meals.    [provider]  senna-docusate (SENOKOT-S) 8.6-50 MG tablet Take 1 tablet by mouth at bedtime as needed  for mild constipation. 12/11/16   Gouru, Illene Silver, MD  torsemide (DEMADEX) 5 MG tablet Take 5 mg by mouth daily.    [provider]  traZODone (DESYREL) 50 MG tablet Take 1 tablet (50 mg total) by mouth at bedtime as needed for sleep. 04/28/17   Nicholes Mango, MD      PHYSICAL EXAMINATION:   VITAL SIGNS: Blood pressure 125/68, pulse 96, temperature 98.3 F (36.8 C), temperature source Oral, SpO2 99 %.  GENERAL:  83 y.o.-year-old patient lying in the bed with no acute distress.  EYES: Pupils equal, round, reactive to light and accommodation. No scleral icterus. Extraocular muscles intact. Conjunctiva pale. HEENT: Head atraumatic, normocephalic. Oropharynx and nasopharynx clear.  NECK:  Supple, no jugular venous distention. No thyroid enlargement, no tenderness.  LUNGS: Normal breath sounds bilaterally, no wheezing, rales,rhonchi or crepitation. No use of accessory muscles of respiration.  CARDIOVASCULAR: S1, S2 normal. No murmurs, rubs, or gallops.  ABDOMEN: Soft, nontender, nondistended. Bowel sounds present. No organomegaly or  mass.  EXTREMITIES: No pedal edema, cyanosis, or clubbing.  NEUROLOGIC: Cranial nerves II through XII are intact. Muscle strength 4- 5/5 in all extremities. Sensation intact. Gait not checked.  PSYCHIATRIC: The patient is alert and oriented x 3.  SKIN: No obvious rash, lesion, or ulcer.   LABORATORY PANEL:   CBC  Recent Labs Lab 05/23/17 0507 05/23/17 1455 05/24/17 0609 05/28/17 1603  WBC 10.5 7.4 7.7 7.6  HGB 8.5* 9.2* 8.4* 7.6*  HCT 27.3* 29.9* 26.8* 25.4*  PLT 311 298 300 331  MCV 77.1* 76.6* 76.7* 77.7*  MCH 24.0* 23.7* 24.2* 23.2*  MCHC 31.1* 30.9* 31.5* 29.9*  RDW 19.9* 19.7* 19.8* 17.1*  LYMPHSABS  --  0.9*  --  920  MONOABS  --  0.7  --   --   EOSABS  --  0.1  --  122  BASOSABS  --  0.1  --  30   ------------------------------------------------------------------------------------------------------------------  Chemistries   Recent  Labs Lab 05/23/17 0700 05/23/17 1455 05/24/17 0609 05/28/17 1603  NA 134* 138 138 138  K 3.6 3.6 3.6 4.5  CL 100* 102 102 102  CO2 _0 GLUCOSE 82 85 73 105*  BUN 21* _1 CREATININE 1.12* 0.99 1.01* 1.07*  CALCIUM 8.3* 8.8* 8.9 9.0  MG 1.6*  --   --   --   AST  --  24  --  25  ALT  --  16  --  15  ALKPHOS  --  97  --   --   BILITOT  --  0.8  --  0.5   ------------------------------------------------------------------------------------------------------------------ estimated creatinine clearance is 37 mL/min (A) (by C-G formula based on SCr of 1.07 mg/dL (H)). ------------------------------------------------------------------------------------------------------------------ No results for input(s): TSH, T4TOTAL, T3FREE, THYROIDAB in the last 72 hours.  Invalid input(s): FREET3   Coagulation profile No results for input(s): INR, PROTIME in the last 168 hours. ------------------------------------------------------------------------------------------------------------------- No results for input(s): DDIMER in the last 72 hours. -------------------------------------------------------------------------------------------------------------------  Cardiac Enzymes No results for input(s): CKMB, TROPONINI, MYOGLOBIN in the last 168 hours.  Invalid input(s): CK ------------------------------------------------------------------------------------------------------------------ Invalid input(s): POCBNP  ---------------------------------------------------------------------------------------------------------------  Urinalysis    Component Value Date/Time   COLORURINE YELLOW 01/27/2017 2158   APPEARANCEUR CLEAR 01/27/2017 2158   APPEARANCEUR Clear 08/30/2012 0019   LABSPEC 1.015 01/27/2017 2158   LABSPEC 1.004 08/30/2012 0019   PHURINE 6.5 01/27/2017 2158   GLUCOSEU NEGATIVE 01/27/2017 2158   GLUCOSEU Negative 08/30/2012 0019   HGBUR NEGATIVE 01/27/2017 2158    BILIRUBINUR NEGATIVE 01/27/2017 2158   BILIRUBINUR Negative 08/30/2012 0019   KETONESUR 15 (A) 01/27/2017 2158   PROTEINUR NEGATIVE 01/27/2017 2158   NITRITE NEGATIVE 01/27/2017 2158   LEUKOCYTESUR TRACE (A) 01/27/2017 2158   LEUKOCYTESUR Negative 08/30/2012 0019     RADIOLOGY: No results found.  EKG: Orders placed or performed during the hospital encounter of 05/19/17  . ED EKG  . ED EKG  . EKG    IMPRESSION AND PLAN:  * Symptomatic anemia   Likely Iron deficiency anemia   Had colonoscopy and Endoscopy done 5 days ago- negative.   Was never on Iron supplement.    Give 1 unit PRBC, and start on iron supplement.   discussed with pt about possible side effects of blood transfusion- including more common like low grade fever to rare and serious like renal and lung involvement and infections.  She understood- and due to necessity of transfusion- agreed to receive the transfusion.   Need hematology  clinic follow ups.  * recent DVT    On eliquis, cont.  * Pneumonia- treated last week, no cough, recovered.  * depression   Cont prozac.  * Sick sinus syndrome   PPM.   All the records are reviewed and case discussed with ED provider. Management plans discussed with the patient, family and they are in agreement.  CODE STATUS: Full    Code Status Orders        Start     Ordered   05/29/17 1712  Full code  Continuous     05/29/17 1711    Code Status History    Date Active Date Inactive Code Status Order ID Comments User Context   05/20/2017  2:50 AM 05/24/2017 10:12 PM Full Code [id number]  Quintella Baton, MD Inpatient   04/18/2017 10:43 AM 04/28/2017  8:09 PM Full Code [id number]  Demetrios Loll, MD Inpatient   01/27/2017 11:18 PM 01/31/2017  2:31 PM Full Code [id number]  A[place], Ubaldo Glassing, DO Inpatient   12/08/2016 10:14 PM 12/15/2016  3:09 PM Full Code [id number]  Demetrios Loll, MD ED    Advance Directive Documentation     Most Recent Value  Type of Advance Directive  Living will   Pre-existing out of facility DNR order (yellow form or pink MOST form)  -  "MOST" Form in Place?  -       TOTAL TIME TAKING CARE OF THIS PATIENT: 45 minutes.    Vaughan Basta M.D on 05/29/2017   Between 7am to 6pm - Pager - [phone]  After 6pm go to [email or web address] - password [email or web address]  Office  [phone]  CC: Primary care physician; Einar Pheasant, MD   Note: This dictation was prepared with Dragon dictation along with smaller phrase technology. Any transcriptional errors that result from this process are unintentional.

## 2017-05-29 NOTE — Assessment & Plan Note (Signed)
On amidarone and metoprolol.  Rate controlled.  On eliquis.  Follow.

## 2017-05-29 NOTE — Assessment & Plan Note (Signed)
Blood pressure under good control.  Continue same medication regimen.  Follow pressures.  Follow metabolic panel.   

## 2017-05-29 NOTE — Assessment & Plan Note (Signed)
S/p pacemaker placement.  Followed by cardiology.  

## 2017-05-29 NOTE — Assessment & Plan Note (Signed)
Has been on trazodone.  Daughter reports did well on 100mg  q hs.  Only on 50mg  now and not sleeping well.  Can increase to 100mg  q hs since has taken and tolerated.  Follow closely.

## 2017-05-29 NOTE — Assessment & Plan Note (Signed)
Found to have left upper extremity DVT.  Evaluated by vascular surgery.  Radiology concern regarding PE.  Vascular did not feel had PE.  On eliquis.  Has appt with hematology for further w/up and evaluation and question of need for long term anticoagulation.

## 2017-05-29 NOTE — Assessment & Plan Note (Signed)
On lovastatin.  Follow lipid panel and liver function tests.   

## 2017-05-29 NOTE — Assessment & Plan Note (Signed)
Has a documented history of iron deficient anemia.  Had colonoscopy and EGD in hospital.  No source of bleeding found.  hgb decreasing on recent checks in hospital.  Concerning given need for anticoagulation.  Recheck cbc.

## 2017-05-30 DIAGNOSIS — D649 Anemia, unspecified: Secondary | ICD-10-CM | POA: Diagnosis not present

## 2017-05-30 LAB — TYPE AND SCREEN
ABO/RH(D): O POS
ANTIBODY SCREEN: NEGATIVE
UNIT DIVISION: 0

## 2017-05-30 LAB — BASIC METABOLIC PANEL
ANION GAP: 9 (ref 5–15)
BUN: 23 mg/dL — ABNORMAL HIGH (ref 6–20)
CHLORIDE: 102 mmol/L (ref 101–111)
CO2: 25 mmol/L (ref 22–32)
Calcium: 9.2 mg/dL (ref 8.9–10.3)
Creatinine, Ser: 1.15 mg/dL — ABNORMAL HIGH (ref 0.44–1.00)
GFR calc Af Amer: 48 mL/min — ABNORMAL LOW (ref >60)
GFR, EST NON AFRICAN AMERICAN: 42 mL/min — AB (ref >60)
GLUCOSE: 97 mg/dL (ref 65–99)
POTASSIUM: 4.2 mmol/L (ref 3.5–5.1)
Sodium: 136 mmol/L (ref 135–145)

## 2017-05-30 LAB — BPAM RBC
Blood Product Expiration Date: 201810272359
ISSUE DATE / TIME: 201810202003
Unit Type and Rh: 5100

## 2017-05-30 LAB — CBC
HEMATOCRIT: 29.8 % — AB (ref 35.0–47.0)
HEMOGLOBIN: 9.5 g/dL — AB (ref 12.0–16.0)
MCH: 24.5 pg — AB (ref 26.0–34.0)
MCHC: 32 g/dL (ref 32.0–36.0)
MCV: 76.6 fL — AB (ref 80.0–100.0)
Platelets: 328 10*3/uL (ref 150–440)
RBC: 3.88 MIL/uL (ref 3.80–5.20)
RDW: 19.6 % — ABNORMAL HIGH (ref 11.5–14.5)
WBC: 8.4 10*3/uL (ref 3.6–11.0)

## 2017-05-30 MED ORDER — IRON SUCROSE 20 MG/ML IV SOLN
150.0000 mg | Freq: Once | INTRAVENOUS | Status: AC
  Administered 2017-05-30: 150 mg via INTRAVENOUS
  Filled 2017-05-30: qty 7.5

## 2017-05-30 MED ORDER — FERROUS SULFATE 325 (65 FE) MG PO TABS: 325.0000 mg | ORAL_TABLET | Freq: Two times a day (BID) | ORAL | 3 refills | Status: AC

## 2017-05-30 MED ORDER — TRAZODONE HCL 100 MG PO TABS: 100.0000 mg | ORAL_TABLET | Freq: Every evening | ORAL | 0 refills | Status: DC | PRN

## 2017-05-30 NOTE — Discharge Instructions (Signed)
Keep appt with Dr Grayland Ormond for [place] for oct 31st

## 2017-05-30 NOTE — Care Management Note (Signed)
Case Management Note  Patient Details  Name: Kristin Avila MRN: [id number] Date of Birth: 1928-04-13  Subjective/Objective:    A resumption of care was called to Kristin Avila at [facility].                 Action/Plan:   Expected Discharge Date:  05/30/17               Expected Discharge Plan:     In-House Referral:     Discharge planning Services     Post Acute Care Choice:    Choice offered to:     DME Arranged:    DME Agency:     HH Arranged:    HH Agency:     Status of Service:     If discussed at [employer] [facility] [employer], dates discussed:    Additional Comments:  Lincoln Ginley A, RN 05/30/2017, 11:17 AM

## 2017-05-30 NOTE — Discharge Summary (Signed)
[facility] NAME: Kristin Avila    MR#:  [id number]  DATE OF BIRTH:  November 07, 1927  DATE OF ADMISSION:  05/29/2017 ADMITTING PHYSICIAN: Vaughan Basta, MD  DATE OF DISCHARGE: 05/30/2017  PRIMARY CARE PHYSICIAN: Einar Pheasant, MD    ADMISSION DIAGNOSIS:  severe anemia  DISCHARGE DIAGNOSIS:  Acute on Chronic IDA  SECONDARY DIAGNOSIS:   Past Medical History:  Diagnosis Date  . Anemia   . Anxiety 10/02/2012  . Atrial fibrillation ([place]) 11/13   new onset 11/13  . BCC (basal cell carcinoma), face 04/24/2016  . Benign essential hypertension 03/13/2015  . Cellulitis of right upper extremity 01/27/2017  . Constipation 01/13/2015  . Decreased hearing 05/21/2015  . Health care maintenance 10/14/2014   Mammogram 10/01/15 - [facility] I.    . Hypercholesterolemia   . Hyperlipidemia, mixed 02/01/2017  . Hypertension   . Osteoarthritis    hands, feet  . Sick sinus syndrome ([place]) 01/04/2017   Overview:  Sp pacer placement 2018  . Syncope 12/08/2016    HOSPITAL COURSE:  Kristin Avila  is a 83 y.o. female with a known history of A fib, Htn, HLP, was here last week for Pneumonia and had LUE DVT and started on Eliquis after checking Endo and colonoscopy, as she have anemia- Noted no source- started Eliquis and sent home.  * Symptomatic anemia  -Likely Iron deficiency anemia  - Had colonoscopy and Endoscopy done 5 days ago- negative.   -now started on po Iron supplement. -came in with hgb 7.6--8.2-- 1 unit PRBC--9.5  -IV venofer x1 today -pt will f/u with Dr Grayland Ormond as out pt on 06/09/2017  * recent DVT    On eliquis, cont.  * Pneumonia- treated last week, no cough, recovered.  * depression   Cont prozac.  * Sick sinus syndrome   PPM.  Ambulated well to nursing station this am No falls D/c home later today Spoke with family in the room D/w RN in the room CONSULTS OBTAINED:    DRUG ALLERGIES:  No Known  Allergies  DISCHARGE MEDICATIONS:   Current Discharge Medication List    START taking these medications   Details  ferrous sulfate 325 (65 FE) MG tablet Take 1 tablet (325 mg total) by mouth 2 (two) times daily with a meal. Qty: 60 tablet, Refills: 3      CONTINUE these medications which have CHANGED   Details  traZODone (DESYREL) 100 MG tablet Take 1 tablet (100 mg total) by mouth at bedtime as needed for sleep. Qty: 15 tablet, Refills: 0      CONTINUE these medications which have NOT CHANGED   Details  amiodarone (PACERONE) 200 MG tablet Take 1 tablet (200 mg total) by mouth 2 (two) times daily.    Cholecalciferol (VITAMIN D-3) 1000 UNITS CAPS Take 1 capsule by mouth daily.     FLUoxetine (PROZAC) 40 MG capsule Take 1 capsule (40 mg total) by mouth daily. Qty: 90 capsule, Refills: 1    lovastatin (MEVACOR) 20 MG tablet Take 1 tablet (20 mg total) by mouth daily. Qty: 90 tablet, Refills: 1    metoprolol succinate (TOPROL-XL) 50 MG 24 hr tablet TAKE (1) TABLET BY MOUTH DAILY. TAKE WITH OR IMMEDIATELY FOLLOWING A MEAL Qty: 30 tablet, Refills: 10    Nutritional Supplements (ENSURE ENLIVE PO) Take 1 Bottle by mouth 2 (two) times daily between meals.    torsemide (DEMADEX) 5 MG tablet Take 5 mg by mouth daily.  acetaminophen (TYLENOL) 325 MG tablet Take 2 tablets (650 mg total) by mouth every 6 (six) hours as needed for mild pain, moderate pain or fever (or Fever >/= 101).    albuterol (PROVENTIL) (2.5 MG/3ML) 0.083% nebulizer solution Take 3 mLs (2.5 mg total) by nebulization every 2 (two) hours as needed for wheezing. Qty: 75 mL, Refills: 12    apixaban (ELIQUIS) 5 MG TABS tablet Take 1 tablet (5 mg total) by mouth 2 (two) times daily. Qty: 60 tablet, Refills: 0    bisacodyl (DULCOLAX) 5 MG EC tablet Take 1 tablet (5 mg total) by mouth daily as needed for moderate constipation. Qty: 30 tablet, Refills: 0    HYDROcodone-acetaminophen (NORCO/VICODIN) 5-325 MG tablet Take  1-2 tablets by mouth every 6 (six) hours as needed for moderate pain. Qty: 120 tablet, Refills: 0    senna-docusate (SENOKOT-S) 8.6-50 MG tablet Take 1 tablet by mouth at bedtime as needed for mild constipation.        If you experience worsening of your admission symptoms, develop shortness of breath, life threatening emergency, suicidal or homicidal thoughts you must seek medical attention immediately by calling 911 or calling your MD immediately  if symptoms less severe.  You Must read complete instructions/literature along with all the possible adverse reactions/side effects for all the Medicines you take and that have been prescribed to you. Take any new Medicines after you have completely understood and accept all the possible adverse reactions/side effects.   Please note  You were cared for by a hospitalist during your hospital stay. If you have any questions about your discharge medications or the care you received while you were in the hospital after you are discharged, you can call the unit and asked to speak with the hospitalist on call if the hospitalist that took care of you is not available. Once you are discharged, your primary care physician will handle any further medical issues. Please note that NO REFILLS for any discharge medications will be authorized once you are discharged, as it is imperative that you return to your primary care physician (or establish a relationship with a primary care physician if you do not have one) for your aftercare needs so that they can reassess your need for medications and monitor your lab values. Today   SUBJECTIVE   No complaints  VITAL SIGNS:  Blood pressure (!) 123/57, pulse 79, temperature 98.4 F (36.9 C), temperature source Oral, resp. rate 16, height 5\' 2"  (1.575 m), weight 77.1 kg (170 lb), SpO2 97 %.  I/O:   Intake/Output Summary (Last 24 hours) at 05/30/17 1014 Last data filed at 05/30/17 0701  Gross per 24 hour  Intake               385 ml  Output               91 ml  Net              294 ml    PHYSICAL EXAMINATION:  GENERAL:  83 y.o.-year-old patient lying in the bed with no acute distress.  EYES: Pupils equal, round, reactive to light and accommodation. No scleral icterus. Extraocular muscles intact.  HEENT: Head atraumatic, normocephalic. Oropharynx and nasopharynx clear.  NECK:  Supple, no jugular venous distention. No thyroid enlargement, no tenderness.  LUNGS: Normal breath sounds bilaterally, no wheezing, rales,rhonchi or crepitation. No use of accessory muscles of respiration.  CARDIOVASCULAR: S1, S2 normal. No murmurs, rubs, or gallops.  ABDOMEN: Soft, non-tender, non-distended.  Bowel sounds present. No organomegaly or mass.  EXTREMITIES: No pedal edema, cyanosis, or clubbing.  NEUROLOGIC: Cranial nerves II through XII are intact. Muscle strength 5/5 in all extremities. Sensation intact. Gait not checked.  PSYCHIATRIC: The patient is alert and oriented x 3.  SKIN: No obvious rash, lesion, or ulcer.   DATA REVIEW:   CBC   Recent Labs Lab 05/30/17 0420  WBC 8.4  HGB 9.5*  HCT 29.8*  PLT 328    Chemistries   Recent Labs Lab 05/23/17 1455  05/28/17 1603  05/30/17 0420  NA 138  < > 138  < > 136  K 3.6  < > 4.5  < > 4.2  CL 102  < > 102  < > 102  CO2 27  < > 26  < > 25  GLUCOSE 85  < > 105*  < > 97  BUN 19  < > 21  < > 23*  CREATININE 0.99  < > 1.07*  < > 1.15*  CALCIUM 8.8*  < > 9.0  < > 9.2  AST 24  --  25  --   --   ALT 16  --  15  --   --   ALKPHOS 97  --   --   --   --   BILITOT 0.8  --  0.5  --   --   < > = values in this interval not displayed.  Microbiology Results   Recent Results (from the past 240 hour(s))  MRSA PCR Screening     Status: None   Collection Time: 05/20/17  2:15 PM  Result Value Ref Range Status   MRSA by PCR NEGATIVE NEGATIVE Final    Comment:        The GeneXpert MRSA Assay (FDA approved for NASAL specimens only), is one component of  a comprehensive MRSA colonization surveillance program. It is not intended to diagnose MRSA infection nor to guide or monitor treatment for MRSA infections.     RADIOLOGY:  No results found.   Management plans discussed with the patient, family and they are in agreement.  CODE STATUS:     Code Status Orders        Start     Ordered   05/29/17 1712  Full code  Continuous     05/29/17 1711    Code Status History    Date Active Date Inactive Code Status Order ID Comments User Context   05/20/2017  2:50 AM 05/24/2017 10:12 PM Full Code [id number]  Quintella Baton, MD Inpatient   04/18/2017 10:43 AM 04/28/2017  8:09 PM Full Code [id number]  Demetrios Loll, MD Inpatient   01/27/2017 11:18 PM 01/31/2017  2:31 PM Full Code [id number]  Wilkinson, Ubaldo Glassing, DO Inpatient   12/08/2016 10:14 PM 12/15/2016  3:09 PM Full Code [id number]  Demetrios Loll, MD ED    Advance Directive Documentation     Most Recent Value  Type of Advance Directive  Living will  Pre-existing out of facility DNR order (yellow form or pink MOST form)  -  "MOST" Form in Place?  -      TOTAL TIME TAKING CARE OF THIS PATIENT: *40* minutes.    Amor Packard M.D on 05/30/2017 at 10:14 AM  Between 7am to 6pm - Pager - [phone] After 6pm go to [email or web address] - password [facility]  Office  [phone]  CC: Primary care physician; Einar Pheasant, MD

## 2017-05-30 NOTE — Progress Notes (Signed)
Urgency noted early in shift. Groin area red, but no itching. Barrier cream applied

## 2017-05-30 NOTE — Progress Notes (Signed)
Discharge instructions reviewed with the patient and her daughter and son. Patient sent out via wheelchair to daughters car

## 2017-06-01 ENCOUNTER — Telehealth: Payer: Self-pay

## 2017-06-01 LAB — ACID FAST CULTURE WITH REFLEXED SENSITIVITIES (MYCOBACTERIA): Acid Fast Culture: NEGATIVE

## 2017-06-01 NOTE — Telephone Encounter (Signed)
Received faxed records from [facility] for immunizations. Pt only received PPD and Flu vaccine.

## 2017-06-01 NOTE — Telephone Encounter (Signed)
Noted.  Needs to be updated.  (thought she stated she received immunization at [facility]).

## 2017-06-02 ENCOUNTER — Telehealth: Payer: Self-pay | Admitting: Internal Medicine

## 2017-06-02 NOTE — Telephone Encounter (Signed)
Pt daughter called about pt still not sleeping well. Pt is already on medication and still not sleeping well. Please advise?  Call pt daughter @ [phone]. Thank you!

## 2017-06-02 NOTE — Telephone Encounter (Signed)
Patient daughter called states that she is still not sleeping well at night. She is having trouble getting to sleep as well as waking up several times at night. She is taking a few naps but they have tried to cut them back to see if that is what the issue is. So she is only taking about a 10 min nap during the day. Feels like she is wired up all the time. She does go to the bathroom a few times when she wakes up. She tries moving to other positions. Nothing is helping. I have advised that you are out of office today and will not have response until tomorrow.

## 2017-06-03 NOTE — Telephone Encounter (Signed)
Left message to return call to our office.  

## 2017-06-03 NOTE — Telephone Encounter (Signed)
Spoke to daughter she would like to give it until follow up with you in November thinks part of it may be adjusting from the hospital stay. She is taking both medications and was informed that you would like to do referral If not helping.

## 2017-06-03 NOTE — Telephone Encounter (Signed)
Is she still taking her prozac?  If no, will need to restart.  Let me know.  If she is taking and is also taking trazodone 100mg  q hs and still having issues, I would like to refer her to psychiatry - for help in medication management with sleep issues and feeling wired.    Let me know if agreeable.

## 2017-06-04 ENCOUNTER — Encounter: Payer: Medicare Other | Admitting: Internal Medicine

## 2017-06-04 ENCOUNTER — Telehealth: Payer: Self-pay

## 2017-06-04 NOTE — Telephone Encounter (Signed)
Nikki called to get orders for OT evaluation. Verbal ok given will call back if any questions.

## 2017-06-07 NOTE — Progress Notes (Deleted)
[place]  [phone] [phone]  ID: Kristin Avila OB: 10-Feb-1928  MR#: [id number]  MHW#:[id number]  Patient Care Team: Einar Pheasant, MD as PCP - General (Internal Medicine) Lucilla Lame, MD as Consulting Physician (Gastroenterology)  CHIEF COMPLAINT: Left upper extremity DVT.  INTERVAL HISTORY: ***  REVIEW OF SYSTEMS:   ROS  As per HPI. Otherwise, a complete review of systems is negative.  PAST MEDICAL HISTORY: Past Medical History:  Diagnosis Date  . Anemia   . Anxiety 10/02/2012  . Atrial fibrillation ([place]) 11/13   new onset 11/13  . BCC (basal cell carcinoma), face 04/24/2016  . Benign essential hypertension 03/13/2015  . Cellulitis of right upper extremity 01/27/2017  . Constipation 01/13/2015  . Decreased hearing 05/21/2015  . Health care maintenance 10/14/2014   Mammogram 10/01/15 - Birads I.    . Hypercholesterolemia   . Hyperlipidemia, mixed 02/01/2017  . Hypertension   . Osteoarthritis    hands, feet  . Sick sinus syndrome ([place]) 01/04/2017   Overview:  Sp pacer placement 2018  . Syncope 12/08/2016    PAST SURGICAL HISTORY: Past Surgical History:  Procedure Laterality Date  . APPENDECTOMY  1957  . CATARACT EXTRACTION  2013   left eye  . COLONOSCOPY  09/06/2008  . COLONOSCOPY WITH PROPOFOL N/A 05/23/2017   Procedure: COLONOSCOPY WITH PROPOFOL;  Surgeon: Lucilla Lame, MD;  Location: [facility] ENDOSCOPY;  Service: Endoscopy;  Laterality: N/A;  . ESOPHAGOGASTRODUODENOSCOPY (EGD) WITH PROPOFOL N/A 05/23/2017   Procedure: ESOPHAGOGASTRODUODENOSCOPY (EGD) WITH PROPOFOL;  Surgeon: Lucilla Lame, MD;  Location: [facility] ENDOSCOPY;  Service: Endoscopy;  Laterality: N/A;  . INCISION AND DRAINAGE ABSCESS Right 01/29/2017   Procedure: INCISION AND DRAINAGE ABSCESS;  Surgeon: Florene Glen, MD;  Location: [facility] ORS;  Service: General;  Laterality: Right;  . KIDNEY SURGERY  age 68  . PACEMAKER INSERTION Left 12/14/2016   Procedure:  INSERTION PACEMAKER;  Surgeon: Isaias Cowman, MD;  Location: [facility] ORS;  Service: Cardiovascular;  Laterality: Left;  . TUBAL LIGATION    . VIDEO ASSISTED THORACOSCOPY (VATS)/THOROCOTOMY Right 04/19/2017   Procedure: RIGHT THORACOSCOPY AND EVACUATION OF HEMOTHORAX;  Surgeon: Nestor Lewandowsky, MD;  Location: [facility] ORS;  Service: General;  Laterality: Right;    FAMILY HISTORY: Family History  Problem Relation Age of Onset  . Congestive Heart Failure Father   . Multiple myeloma Mother   . Hypertension Brother   . Alzheimer's disease Unknown        grandmother and great aunts  . Breast cancer Neg Hx   . Colon cancer Neg Hx     ADVANCED DIRECTIVES (Y/N):  N  HEALTH MAINTENANCE: Social History  Substance Use Topics  . Smoking status: Never Smoker  . Smokeless tobacco: Never Used  . Alcohol use No     Comment: occasional     Colonoscopy:  PAP:  Bone density:  Lipid panel:  No Known Allergies  Current Outpatient Prescriptions  Medication Sig Dispense Refill  . acetaminophen (TYLENOL) 325 MG tablet Take 2 tablets (650 mg total) by mouth every 6 (six) hours as needed for mild pain, moderate pain or fever (or Fever >/= 101).    Marland Kitchen albuterol (PROVENTIL) (2.5 MG/3ML) 0.083% nebulizer solution Take 3 mLs (2.5 mg total) by nebulization every 2 (two) hours as needed for wheezing. 75 mL 12  . amiodarone (PACERONE) 200 MG tablet Take 1 tablet (200 mg total) by mouth 2 (two) times daily.    Marland Kitchen apixaban (ELIQUIS) 5 MG TABS tablet Take 1  tablet (5 mg total) by mouth 2 (two) times daily. (Patient not taking: Reported on 05/29/2017) 60 tablet 0  . bisacodyl (DULCOLAX) 5 MG EC tablet Take 1 tablet (5 mg total) by mouth daily as needed for moderate constipation. (Patient not taking: Reported on 05/29/2017) 30 tablet 0  . Cholecalciferol (VITAMIN D-3) 1000 UNITS CAPS Take 1 capsule by mouth daily.     . ferrous sulfate 325 (65 FE) MG tablet Take 1 tablet (325 mg total) by mouth 2 (two) times daily  with a meal. 60 tablet 3  . FLUoxetine (PROZAC) 40 MG capsule Take 1 capsule (40 mg total) by mouth daily. 90 capsule 1  . HYDROcodone-acetaminophen (NORCO/VICODIN) 5-325 MG tablet Take 1-2 tablets by mouth every 6 (six) hours as needed for moderate pain. 120 tablet 0  . lovastatin (MEVACOR) 20 MG tablet Take 1 tablet (20 mg total) by mouth daily. 90 tablet 1  . metoprolol succinate (TOPROL-XL) 50 MG 24 hr tablet TAKE (1) TABLET BY MOUTH DAILY. TAKE WITH OR IMMEDIATELY FOLLOWING A MEAL 30 tablet 10  . Nutritional Supplements (ENSURE ENLIVE PO) Take 1 Bottle by mouth 2 (two) times daily between meals.    . senna-docusate (SENOKOT-S) 8.6-50 MG tablet Take 1 tablet by mouth at bedtime as needed for mild constipation.    . torsemide (DEMADEX) 5 MG tablet Take 5 mg by mouth daily.    . traZODone (DESYREL) 100 MG tablet Take 1 tablet (100 mg total) by mouth at bedtime as needed for sleep. 15 tablet 0   No current facility-administered medications for this visit.     OBJECTIVE: There were no vitals filed for this visit.   There is no height or weight on file to calculate BMI.    ECOG FS:{CHL ONC [id number]  General: Well-developed, well-nourished, no acute distress. Eyes: Pink conjunctiva, anicteric sclera. HEENT: Normocephalic, moist mucous membranes, clear oropharnyx. Lungs: Clear to auscultation bilaterally. Heart: Regular rate and rhythm. No rubs, murmurs, or gallops. Abdomen: Soft, nontender, nondistended. No organomegaly noted, normoactive bowel sounds. Musculoskeletal: No edema, cyanosis, or clubbing. Neuro: Alert, answering all questions appropriately. Cranial nerves grossly intact. Skin: No rashes or petechiae noted. Psych: Normal affect. Lymphatics: No cervical, calvicular, axillary or inguinal LAD.   LAB RESULTS:  Lab Results  Component Value Date   NA 136 05/30/2017   K 4.2 05/30/2017   CL 102 05/30/2017   CO2 25 05/30/2017   GLUCOSE 97 05/30/2017   BUN 23 (H)  05/30/2017   CREATININE 1.15 (H) 05/30/2017   CALCIUM 9.2 05/30/2017   PROT 5.9 (L) 05/28/2017   ALBUMIN 2.8 (L) 05/23/2017   AST 25 05/28/2017   ALT 15 05/28/2017   ALKPHOS 97 05/23/2017   BILITOT 0.5 05/28/2017   GFRNONAA 42 (L) 05/30/2017   GFRAA 48 (L) 05/30/2017    Lab Results  Component Value Date   WBC 8.4 05/30/2017   NEUTROABS 5,647 05/28/2017   HGB 9.5 (L) 05/30/2017   HCT 29.8 (L) 05/30/2017   MCV 76.6 (L) 05/30/2017   PLT 328 05/30/2017     STUDIES: Dg Chest 2 View  Result Date: 05/19/2017 CLINICAL DATA:  Shortness of Breath.  Lower extremity swelling. EXAM: CHEST  2 VIEW COMPARISON:  04/13/2017 FINDINGS: Left pacer remains in place, unchanged. Cardiomegaly. Small bilateral pleural effusions. Continued right lower lung airspace disease, likely pneumonia, slightly improved. Left base atelectasis. IMPRESSION: Continued right lower lobe airspace disease, likely pneumonia with right effusion, slightly improved. Small left effusion. Left base atelectasis. Electronically Signed  By: Rolm Baptise M.D.   On: 05/19/2017 20:15   Dg Chest 2 View  Result Date: 05/13/2017 CLINICAL DATA:  Follow-up pneumonia EXAM: CHEST  2 VIEW COMPARISON:  05/06/2017, 04/28/2017, 04/18/2017, 12/14/2016 FINDINGS: Hyperinflation. Persistent small pleural effusions. No change in right lower lung pleural and parenchymal opacity. No change in right middle lobe opacity. Stable cardiomediastinal silhouette, slightly enlarged with atherosclerosis no pneumothorax. Left-sided pacing device as before IMPRESSION: 1. Continued right pleural and parenchymal disease in the right middle lobe and right lower lobe which may reflect combination of loculated pleural effusion and continued pneumonia. No significant change from prior. Continued radiographic follow-up recommended 2. Small left pleural effusion with left basilar atelectasis 3. Mild cardiomegaly Electronically Signed   By: Donavan Foil M.D.   On: 05/13/2017  16:16   Ct Angio Chest Pe W Or Wo Contrast  Result Date: 05/20/2017 CLINICAL DATA:  Acute onset of shortness of breath and bilateral lower extremity swelling. Tiredness. Initial encounter. EXAM: CT ANGIOGRAPHY CHEST WITH CONTRAST TECHNIQUE: Multidetector CT imaging of the chest was performed using the standard protocol during bolus administration of intravenous contrast. Multiplanar CT image reconstructions and MIPs were obtained to evaluate the vascular anatomy. CONTRAST:  75 mL of Isovue 370 IV contrast COMPARISON:  Chest radiograph performed 05/19/2017 FINDINGS: Cardiovascular: Pulmonary embolus is noted within segmental and subsegmental branches to the right middle and lower lobes. The RV/LV ratio is 1.2, which may reflect right heart strain or some degree of underlying chronic right heart failure, given diffuse enlargement of the right side of the heart. The heart is enlarged. A pacemaker is noted at the left chest wall, with leads ending at the right atrium and right ventricle. Scattered calcification is noted along the aortic arch and proximal great vessels. Mediastinum/Nodes: The mediastinum is otherwise unremarkable. No mediastinal lymphadenopathy is seen. No significant pericardial effusion is seen. The visualized portions of the thyroid gland are grossly unremarkable. No axillary lymphadenopathy is appreciated. Vague edema is noted at the left axilla and extending inferiorly along the left chest wall, of uncertain significance. Lungs/Pleura: Small bilateral pleural effusions are noted, with underlying interstitial prominence and partial consolidation of the lower lobes bilaterally. This may reflect some degree of underlying interstitial edema. Mild peripheral opacities are noted within the left upper lobe, of uncertain significance. Would correlate for evidence of infection. No pneumothorax is seen. No dominant mass is identified. Fluid tracks along the right major fissure. Upper Abdomen: The  visualized portions of the liver are unremarkable. There is reflux of contrast into the hepatic veins and IVC. Hypoattenuation along the periphery of the spleen may reflect remote traumatic injury. Musculoskeletal: No acute osseous abnormalities are identified. Degenerative change is noted at the left glenohumeral joint, with mild bony remodeling at the left humeral head. Overlying osseous fragments may reflect chronic changes of calcific tendinitis. The visualized musculature is unremarkable in appearance. Review of the MIP images confirms the above findings. IMPRESSION: 1. Pulmonary embolus within segmental and subsegmental branches to the right middle and lower lung lobes. RV/LV ratio of 1.2 may reflect right heart strain or some degree of underlying chronic right heart failure, given diffuse enlargement of the right side of the heart. Right heart strain would be consistent with at least submassive (intermediate risk) PE. The presence of right heart strain has been associated with an increased risk of morbidity and mortality. 2. Small bilateral pleural effusions, with underlying interstitial prominence and partial consolidation of the lower lobes bilaterally. This may reflect some degree  of underlying interstitial edema. Mild peripheral opacities at the left upper lobe are of uncertain significance. Would correlate for evidence of infection. 3. Osseous fragments overlying the left glenohumeral joint may reflect changes of calcific tendinitis. Underlying degenerative change at the left glenohumeral joint, with mild bony remodeling at the left humeral head. 4. Hypoattenuation along the periphery of the spleen may reflect remote traumatic injury. Critical Value/emergent results were called by telephone at the time of interpretation on 05/20/2017 at 2:54 am to [facility] at [place], who verbally acknowledged these results. Electronically Signed   By: Garald Balding M.D.   On: 05/20/2017  02:57   US Venous Img Upper Uni Left  Result Date: 05/19/2017 CLINICAL DATA:  Shortness of breath. Left upper extremity pain and edema for the past 2 days. History of malignancy. EXAM: LEFT UPPER EXTREMITY VENOUS DOPPLER ULTRASOUND TECHNIQUE: Gray-scale sonography with graded compression, as well as color Doppler and duplex ultrasound were performed to evaluate the upper extremity deep venous system from the level of the subclavian vein and including the jugular, axillary, basilic, radial, ulnar and upper cephalic vein. Spectral Doppler was utilized to evaluate flow at rest and with distal augmentation maneuvers. COMPARISON:  None. FINDINGS: Contralateral Subclavian Vein: Respiratory phasicity is normal and symmetric with the symptomatic side. No evidence of thrombus. Normal compressibility. Internal Jugular Vein: No evidence of thrombus. Normal compressibility, respiratory phasicity and response to augmentation. There is mixed echogenic occlusive thrombus extending from the left subclavian (image 11), through the left axillary (image 17) extending to involve 1 of the paired brachial veins (image 35). The additional paired brachial vein appears patent. The radioulnar veins appear patent where imaged. There is occlusive superficial thrombophlebitis within the left cephalic (image 20) and basilic vein (image 25). Other Findings:  None visualized. IMPRESSION: 1. Extensive occlusive DVT extending from the left subclavian through one of the paired left brachial veins. 2. Extensive occlusive superficial thrombophlebitis involving the left cephalic and basilic veins. Electronically Signed   By: Sandi Mariscal M.D.   On: 05/19/2017 21:05    ASSESSMENT: Left upper extremity DVT.  PLAN:   1. Left upper extremity DVT:   Patient expressed understanding and was in agreement with this plan. She also understands that She can call clinic at any time with any questions, concerns, or complaints.   Cancer Staging No  matching staging information was found for the patient.  Lloyd Huger, MD   06/07/2017 11:29 AM

## 2017-06-09 ENCOUNTER — Inpatient Hospital Stay: Payer: Medicare Other | Admitting: Oncology

## 2017-06-10 ENCOUNTER — Telehealth: Payer: Self-pay | Admitting: Internal Medicine

## 2017-06-10 NOTE — Telephone Encounter (Signed)
Ok

## 2017-06-10 NOTE — Telephone Encounter (Signed)
Ok to give verbal order.

## 2017-06-10 NOTE — Telephone Encounter (Signed)
Kristin Avila with [facility] called to inform the physician that the patient has a bread down on her sacrum and buttocks . The skin is still intact ,but it is red and irritated. Judeen Hammans and the patient's daughter agree that she will need an order for a home health aid to help with her hygiene and care of her irritated skin.

## 2017-06-11 NOTE — Telephone Encounter (Signed)
Called Judeen Hammans gave verbal to do evaluation for home health to assist with hygiene.

## 2017-06-15 ENCOUNTER — Encounter: Payer: Medicare Other | Admitting: Internal Medicine

## 2017-06-18 ENCOUNTER — Other Ambulatory Visit: Payer: Self-pay | Admitting: Internal Medicine

## 2017-06-21 ENCOUNTER — Telehealth: Payer: Self-pay

## 2017-06-21 NOTE — Telephone Encounter (Signed)
PPW sent for home health certification. Placed in your red folder.

## 2017-06-22 DIAGNOSIS — Z950 Presence of cardiac pacemaker: Secondary | ICD-10-CM | POA: Diagnosis not present

## 2017-06-22 DIAGNOSIS — J189 Pneumonia, unspecified organism: Secondary | ICD-10-CM | POA: Diagnosis not present

## 2017-06-22 DIAGNOSIS — I4891 Unspecified atrial fibrillation: Secondary | ICD-10-CM | POA: Diagnosis not present

## 2017-06-22 DIAGNOSIS — I82622 Acute embolism and thrombosis of deep veins of left upper extremity: Secondary | ICD-10-CM | POA: Diagnosis not present

## 2017-06-22 DIAGNOSIS — R2689 Other abnormalities of gait and mobility: Secondary | ICD-10-CM | POA: Diagnosis not present

## 2017-06-22 DIAGNOSIS — Z7901 Long term (current) use of anticoagulants: Secondary | ICD-10-CM | POA: Diagnosis not present

## 2017-06-22 DIAGNOSIS — I10 Essential (primary) hypertension: Secondary | ICD-10-CM | POA: Diagnosis not present

## 2017-06-22 DIAGNOSIS — B9689 Other specified bacterial agents as the cause of diseases classified elsewhere: Secondary | ICD-10-CM | POA: Diagnosis not present

## 2017-06-22 DIAGNOSIS — M6281 Muscle weakness (generalized): Secondary | ICD-10-CM | POA: Diagnosis not present

## 2017-06-22 NOTE — Telephone Encounter (Signed)
Form completed and placed in box.  

## 2017-06-23 NOTE — Telephone Encounter (Signed)
Faxed and copy sent to charge.

## 2017-06-24 ENCOUNTER — Inpatient Hospital Stay: Payer: Medicare Other | Admitting: Oncology

## 2017-06-25 ENCOUNTER — Other Ambulatory Visit: Payer: Self-pay | Admitting: Internal Medicine

## 2017-06-29 ENCOUNTER — Ambulatory Visit: Payer: Self-pay | Admitting: *Deleted

## 2017-06-29 NOTE — Telephone Encounter (Signed)
The daughter, Kristin Avila called in for her mother.   Kristin Avila is in [place] and her mother is in [place].   After her physical therapy at home this morning the therapist said her pulse was 59.   She has A. Fib however she was asymptomatic per daughter.   I suggested per the protocol for low heart rate (that didn't really apply to her) she should be evaluated in the ED or urgent care.   Daughter stated,  "She did not want to go to the ED or urgent care"   "I'm not crazy about the care they give at those places"   Since pt was asymptomatic now and earlier except a brief shortness of breath she felt at the end of PT I would leave the decision up to the daughter Kristin Avila.   She said her grandson is there with her mother now in [place].  She is going to call back there and see how her mother is doing.   She will call us back if they have any further concerns.  I gave Kristin Avila a list of the symptoms to look for with a slow heart rate.  Kristin Avila acknowledged she would call us back for any symptoms/problems.    Reason for Disposition . Age > 60 years (Exception: brief heart beat symptoms that went away and now feels well)  Answer Assessment - Initial Assessment Questions 1. DESCRIPTION: "Please describe your heart rate or heart beat that you are having" (e.g., fast/slow, regular/irregular, skipped or extra beats, "palpitations")     The Home PT said her pulse was low at 59.  Has A. Fib. 2. ONSET: "When did it start?" (Minutes, hours or days)      This morning after getting PT at home. 3. DURATION: "How long does it last" (e.g., seconds, minutes, hours)     Noticed breathing was "abnormal"   4. PATTERN "Does it come and go, or has it been constant since it started?"  "Does it get worse with exertion?"   "Are you feeling it now?"     "She is fine now" 5. TAP: "Using your hand, can you tap out what you are feeling on a chair or table in front of you, so that I can hear?" (Note: not all patients can do this)        6.  HEART RATE: "Can you tell me your heart rate?" "How many beats in 15 seconds?"  (Note: not all patients can do this)       Daughter is in [place] and pt is in [place].  Yolanda Bonine is with the pt. 7. RECURRENT SYMPTOM: "Have you ever had this before?" If so, ask: "When was the last time?" and "What happened that time?"      She does have A. Fib. 8. CAUSE: "What do you think is causing the palpitations?"     Not feeling palpitations 9. CARDIAC HISTORY: "Do you have any history of heart disease?" (e.g., heart attack, angina, bypass surgery, angioplasty, arrhythmia)      A.Fib   Been in the hospital several times lately.  10. OTHER SYMPTOMS: "Do you have any other symptoms?" (e.g., dizziness, chest pain, sweating, difficulty breathing)       Denies dizziness, chest pain, sweating, nausea, any type of radiating pain per daughter. 11. PREGNANCY: "Is there any chance you are pregnant?" "When was your last menstrual period?"       N/A  Protocols used: [place]

## 2017-06-30 ENCOUNTER — Telehealth: Payer: Self-pay | Admitting: Internal Medicine

## 2017-06-30 ENCOUNTER — Other Ambulatory Visit: Payer: Self-pay | Admitting: *Deleted

## 2017-06-30 MED ORDER — LOVASTATIN 20 MG PO TABS: 20.0000 mg | ORAL_TABLET | Freq: Every day | ORAL | 1 refills | Status: AC

## 2017-06-30 NOTE — Telephone Encounter (Signed)
Copied from Page. Topic: Quick Communication - See Telephone Encounter >> Jun 30, 2017  3:28 PM Percell Belt A wrote: CRM for notification. See Telephone encounter for:  pt daughter called in and said that pt needs a refill on  lovastatin (MEVACOR) 20 MG tablet [[id number]] .  Pt only has 1 pill left  Pharmacy - [facility]    06/30/17.

## 2017-06-30 NOTE — Telephone Encounter (Signed)
Rx request- warning appears

## 2017-06-30 NOTE — Telephone Encounter (Signed)
Please advise 

## 2017-06-30 NOTE — Telephone Encounter (Signed)
Refill sent in

## 2017-07-20 ENCOUNTER — Encounter: Payer: Self-pay | Admitting: Emergency Medicine

## 2017-07-20 ENCOUNTER — Telehealth: Payer: Self-pay | Admitting: Internal Medicine

## 2017-07-20 ENCOUNTER — Emergency Department
Admission: EM | Admit: 2017-07-20 | Discharge: 2017-07-20 | Disposition: A | Discharge status: Home / Self Care | Payer: Medicare Other | Attending: Emergency Medicine | Admitting: Emergency Medicine

## 2017-07-20 ENCOUNTER — Ambulatory Visit: Payer: Self-pay | Admitting: *Deleted

## 2017-07-20 DIAGNOSIS — Z950 Presence of cardiac pacemaker: Secondary | ICD-10-CM | POA: Insufficient documentation

## 2017-07-20 DIAGNOSIS — H1131 Conjunctival hemorrhage, right eye: Secondary | ICD-10-CM

## 2017-07-20 DIAGNOSIS — Z7901 Long term (current) use of anticoagulants: Secondary | ICD-10-CM | POA: Insufficient documentation

## 2017-07-20 DIAGNOSIS — E876 Hypokalemia: Secondary | ICD-10-CM | POA: Diagnosis not present

## 2017-07-20 DIAGNOSIS — Z79899 Other long term (current) drug therapy: Secondary | ICD-10-CM | POA: Diagnosis not present

## 2017-07-20 DIAGNOSIS — I10 Essential (primary) hypertension: Secondary | ICD-10-CM | POA: Insufficient documentation

## 2017-07-20 DIAGNOSIS — Z85828 Personal history of other malignant neoplasm of skin: Secondary | ICD-10-CM | POA: Diagnosis not present

## 2017-07-20 DIAGNOSIS — N644 Mastodynia: Secondary | ICD-10-CM | POA: Diagnosis present

## 2017-07-20 LAB — BASIC METABOLIC PANEL
ANION GAP: 11 (ref 5–15)
BUN: 17 mg/dL (ref 6–20)
CHLORIDE: 100 mmol/L — AB (ref 101–111)
CO2: 27 mmol/L (ref 22–32)
Calcium: 8.8 mg/dL — ABNORMAL LOW (ref 8.9–10.3)
Creatinine, Ser: 1.15 mg/dL — ABNORMAL HIGH (ref 0.44–1.00)
GFR, EST AFRICAN AMERICAN: 48 mL/min — AB (ref >60)
GFR, EST NON AFRICAN AMERICAN: 42 mL/min — AB (ref >60)
Glucose, Bld: 110 mg/dL — ABNORMAL HIGH (ref 65–99)
POTASSIUM: 2.3 mmol/L — AB (ref 3.5–5.1)
SODIUM: 138 mmol/L (ref 135–145)

## 2017-07-20 LAB — CBC
HCT: 40.7 % (ref 35.0–47.0)
Hemoglobin: 13.1 g/dL (ref 12.0–16.0)
MCH: 28.8 pg (ref 26.0–34.0)
MCHC: 32.2 g/dL (ref 32.0–36.0)
MCV: 89.5 fL (ref 80.0–100.0)
Platelets: 183 10*3/uL (ref 150–440)
RBC: 4.55 MIL/uL (ref 3.80–5.20)
RDW: 25.8 % — ABNORMAL HIGH (ref 11.5–14.5)
WBC: 7.1 10*3/uL (ref 3.6–11.0)

## 2017-07-20 LAB — TROPONIN I: TROPONIN I: 0.03 ng/mL — AB (ref <0.03)

## 2017-07-20 LAB — MAGNESIUM: MAGNESIUM: 1.5 mg/dL — AB (ref 1.7–2.4)

## 2017-07-20 MED ORDER — POTASSIUM CHLORIDE CRYS ER 20 MEQ PO TBCR: 20.0000 meq | EXTENDED_RELEASE_TABLET | Freq: Two times a day (BID) | ORAL | 0 refills | Status: DC

## 2017-07-20 MED ORDER — POTASSIUM CHLORIDE CRYS ER 20 MEQ PO TBCR
40.0000 meq | EXTENDED_RELEASE_TABLET | Freq: Once | ORAL | Status: AC
  Administered 2017-07-20: 40 meq via ORAL
  Filled 2017-07-20: qty 2

## 2017-07-20 MED ORDER — MAGNESIUM SULFATE 2 GM/50ML IV SOLN
2.0000 g | Freq: Once | INTRAVENOUS | Status: AC
  Administered 2017-07-20: 2 g via INTRAVENOUS
  Filled 2017-07-20: qty 50

## 2017-07-20 MED ORDER — POTASSIUM CHLORIDE 10 MEQ/100ML IV SOLN
10.0000 meq | INTRAVENOUS | Status: DC
  Administered 2017-07-20: 10 meq via INTRAVENOUS
  Filled 2017-07-20 (×3): qty 100

## 2017-07-20 NOTE — ED Provider Notes (Signed)
[facility] Emergency Department Provider Note  ____________________________________________   First MD Initiated Contact with Patient 07/20/17 1641     (approximate)  I have reviewed the triage vital signs and the nursing notes.   HISTORY  Chief Complaint Abnormal Lab  History is limited by the patient being a vague historian  HPI Kristin Avila is a 83 y.o. female who reportedly presents for evaluation of low white blood cell count.  The patient is not sure where this report came from, she states that she was here because her eye was red and she was having some breast pain on the right side a couple of days ago which has resolved.  She has not been to the doctor earlier today to have lab work.  She denies fever/chills, chest pain, shortness of breath, nausea, vomiting, diarrhea, and dysuria.  It is unclear how severe was her reported leukopenia.   Past Medical History:  Diagnosis Date  . Anemia   . Anxiety 10/02/2012  . Atrial fibrillation ([place]) 11/13   new onset 11/13  . BCC (basal cell carcinoma), face 04/24/2016  . Benign essential hypertension 03/13/2015  . Cellulitis of right upper extremity 01/27/2017  . Constipation 01/13/2015  . Decreased hearing 05/21/2015  . Health care maintenance 10/14/2014   Mammogram 10/01/15 - [place] I.    . Hypercholesterolemia   . Hyperlipidemia, mixed 02/01/2017  . Hypertension   . Osteoarthritis    hands, feet  . Sick sinus syndrome ([place]) 01/04/2017   Overview:  Sp pacer placement 2018  . Syncope 12/08/2016    Patient Active Problem List   Diagnosis Date Noted  . Sleeping difficulty 05/29/2017  . Symptomatic anemia 05/29/2017  . Iron deficiency anemia   . Iron deficiency anemia due to chronic blood loss   . DVT (deep venous thrombosis) ([address]) 05/20/2017  . Community acquired pneumonia 05/20/2017  . Encephalopathy acute 04/29/2017  . Pleural effusion on right 04/18/2017  . Hypotension 04/18/2017  .  Hyperlipidemia, mixed 02/01/2017  . Cellulitis of right upper extremity 01/27/2017  . Sick sinus syndrome ([place]) 01/04/2017  . Syncope 12/08/2016  . BCC (basal cell carcinoma), face 04/24/2016  . Decreased hearing 05/21/2015  . Benign essential hypertension 03/13/2015  . Constipation 01/13/2015  . Health care maintenance 10/14/2014  . Anxiety 10/02/2012  . Atrial fibrillation ([place]) 07/09/2012  . Osteoarthritis 07/08/2012  . Hypercholesterolemia 07/08/2012  . Hypertension 07/08/2012    Past Surgical History:  Procedure Laterality Date  . APPENDECTOMY  1957  . CATARACT EXTRACTION  2013   left eye  . COLONOSCOPY  09/06/2008  . COLONOSCOPY WITH PROPOFOL N/A 05/23/2017   Procedure: COLONOSCOPY WITH PROPOFOL;  Surgeon: Lucilla Lame, MD;  Location: [facility] ENDOSCOPY;  Service: Endoscopy;  Laterality: N/A;  . ESOPHAGOGASTRODUODENOSCOPY (EGD) WITH PROPOFOL N/A 05/23/2017   Procedure: ESOPHAGOGASTRODUODENOSCOPY (EGD) WITH PROPOFOL;  Surgeon: Lucilla Lame, MD;  Location: [facility] ENDOSCOPY;  Service: Endoscopy;  Laterality: N/A;  . INCISION AND DRAINAGE ABSCESS Right 01/29/2017   Procedure: INCISION AND DRAINAGE ABSCESS;  Surgeon: Florene Glen, MD;  Location: [facility] ORS;  Service: General;  Laterality: Right;  . KIDNEY SURGERY  age 51  . PACEMAKER INSERTION Left 12/14/2016   Procedure: INSERTION PACEMAKER;  Surgeon: Isaias Cowman, MD;  Location: [facility] ORS;  Service: Cardiovascular;  Laterality: Left;  . TUBAL LIGATION    . VIDEO ASSISTED THORACOSCOPY (VATS)/THOROCOTOMY Right 04/19/2017   Procedure: RIGHT THORACOSCOPY AND EVACUATION OF HEMOTHORAX;  Surgeon: Nestor Lewandowsky, MD;  Location: [facility] ORS;  Service: General;  Laterality: Right;    Prior to Admission medications   Medication Sig Start Date End Date Taking? Authorizing Provider  amiodarone (PACERONE) 200 MG tablet TAKE 1 TABLET BY MOUTH TWICE A DAY 06/21/17  Yes Einar Pheasant, MD  apixaban (ELIQUIS) 5 MG TABS tablet Take 1 tablet (5 mg  total) by mouth 2 (two) times daily. 05/30/17  Yes Mody, Ulice Bold, MD  bisacodyl (DULCOLAX) 5 MG EC tablet Take 1 tablet (5 mg total) by mouth daily as needed for moderate constipation. 04/28/17  Yes Gouru, Illene Silver, MD  Cholecalciferol (VITAMIN D-3) 1000 UNITS CAPS Take 1 capsule by mouth daily.    Yes [provider]  ferrous sulfate 325 (65 FE) MG tablet Take 1 tablet (325 mg total) by mouth 2 (two) times daily with a meal. 05/30/17  Yes Fritzi Mandes, MD  FLUoxetine (PROZAC) 40 MG capsule Take 1 capsule (40 mg total) by mouth daily. 01/18/17  Yes Einar Pheasant, MD  lovastatin (MEVACOR) 20 MG tablet Take 1 tablet (20 mg total) by mouth daily. 06/30/17  Yes Einar Pheasant, MD  metoprolol succinate (TOPROL-XL) 50 MG 24 hr tablet TAKE (1) TABLET BY MOUTH DAILY. TAKE WITH OR IMMEDIATELY FOLLOWING A MEAL 01/19/17  Yes Einar Pheasant, MD  Nutritional Supplements (ENSURE ENLIVE PO) Take 1 Bottle by mouth 2 (two) times daily between meals.   Yes [provider]  torsemide (DEMADEX) 5 MG tablet TAKE 1 TABLET BY MOUTH EVERY DAY 06/21/17  Yes Einar Pheasant, MD  traZODone (DESYREL) 100 MG tablet TAKE 1 TABLET (100 MG TOTAL) BY MOUTH AT BEDTIME AS NEEDED FOR SLEEP. 06/25/17  Yes Einar Pheasant, MD  acetaminophen (TYLENOL) 325 MG tablet Take 2 tablets (650 mg total) by mouth every 6 (six) hours as needed for mild pain, moderate pain or fever (or Fever >/= 101). 12/11/16   Gouru, Illene Silver, MD  albuterol (PROVENTIL) (2.5 MG/3ML) 0.083% nebulizer solution Take 3 mLs (2.5 mg total) by nebulization every 2 (two) hours as needed for wheezing. 04/28/17   Nicholes Mango, MD  HYDROcodone-acetaminophen (NORCO/VICODIN) 5-325 MG tablet Take 1-2 tablets by mouth every 6 (six) hours as needed for moderate pain. 04/29/17   Toni Arthurs, NP  potassium chloride SA (KLOR-CON M20) 20 MEQ tablet Take 1 tablet (20 mEq total) by mouth 2 (two) times daily for 14 days. 07/20/17 08/03/17  Hinda Kehr, MD  senna-docusate  (SENOKOT-S) 8.6-50 MG tablet Take 1 tablet by mouth at bedtime as needed for mild constipation. 12/11/16   Nicholes Mango, MD    Allergies Patient has no known allergies.  Family History  Problem Relation Age of Onset  . Congestive Heart Failure Father   . Multiple myeloma Mother   . Hypertension Brother   . Alzheimer's disease Unknown        grandmother and great aunts  . Breast cancer Neg Hx   . Colon cancer Neg Hx     Social History Social History   Tobacco Use  . Smoking status: Never Smoker  . Smokeless tobacco: Never Used  Substance Use Topics  . Alcohol use: No    Alcohol/week: 0.0 oz    Comment: occasional  . Drug use: No    Review of Systems Constitutional: No fever/chills Eyes: No visual changes.  Redness of right eye ENT: No sore throat. Cardiovascular: Denies chest pain. Respiratory: Denies shortness of breath. Gastrointestinal: No abdominal pain.  No nausea, no vomiting.  No diarrhea.  No constipation. Genitourinary: Negative for dysuria. Musculoskeletal: Negative for neck pain.  Negative for back pain. Integumentary: Negative for rash.  Some right-sided breast pain several days ago which has resolved. Neurological: Negative for headaches, focal weakness or numbness.   ____________________________________________   PHYSICAL EXAM:  VITAL SIGNS: ED Triage Vitals  Enc Vitals Group     BP 07/20/17 1447 (!) 150/92     Pulse Rate 07/20/17 1447 88     Resp 07/20/17 1447 16     Temp --      Temp src --      SpO2 07/20/17 1447 96 %     Weight 07/20/17 1448 77.1 kg (170 lb)     Height 07/20/17 1448 1.575 m (5' 2" )     Head Circumference --      Peak Flow --      Pain Score --      Pain Loc --      Pain Edu? --      Excl. in [place]? --     Constitutional: Alert and oriented but vague historian, seems as if she may have some mild memory deficit Eyes: Right eye lateral sub-conjunctiva hemorrhage, no discharge or purulence, no indication of conjunctivitis.   Pupils are equal and reactive bilaterally Head: Atraumatic. Neck: No stridor.  No meningeal signs.   Cardiovascular: Normal rate, regular rhythm. Good peripheral circulation. Grossly normal heart sounds. Respiratory: Normal respiratory effort.  No retractions. Lungs CTAB. Gastrointestinal: Soft and nontender. No distention.  Musculoskeletal: No lower extremity tenderness nor edema. No gross deformities of extremities. Neurologic:  Normal speech and language. No gross focal neurologic deficits are appreciated.  Skin:  Skin is warm, dry and intact. No rash noted.  The right breast is slightly larger than the left but there is no erythema, tenderness, nor induration to suggest an infectious or inflammatory process.  No skin changes are obvious.  ED chaperone present throughout exam.   Psychiatric: Mood and affect are normal. Speech and behavior are normal.  ____________________________________________   LABS (all labs ordered are listed, but only abnormal results are displayed)  Labs Reviewed  CBC - Abnormal; Notable for the following components:      Result Value   RDW 25.8 (*)    All other components within normal limits  TROPONIN I - Abnormal; Notable for the following components:   Troponin I 0.03 (*)    All other components within normal limits  BASIC METABOLIC PANEL - Abnormal; Notable for the following components:   Potassium 2.3 (*)    Chloride 100 (*)    Glucose, Bld 110 (*)    Creatinine, Ser 1.15 (*)    Calcium 8.8 (*)    GFR calc non Af Amer 42 (*)    GFR calc Af Amer 48 (*)    All other components within normal limits  MAGNESIUM - Abnormal; Notable for the following components:   Magnesium 1.5 (*)    All other components within normal limits   ____________________________________________  EKG  ED ECG REPORT I, Hinda Kehr, the attending physician, personally viewed and interpreted this ECG.  Date: 07/20/2017 EKG Time: 14:55 Rate: about 75-100, paced  rhythm Rhythm: paced rhythm QRS Axis: LAD (paced rhythm) Intervals: normal ST/T Wave abnormalities: Non-specific ST segment / T-wave changes, but no evidence of acute ischemia. Narrative Interpretation: no evidence of acute ischemia, paced rhythm   ____________________________________________  RADIOLOGY   No results found.  ____________________________________________   PROCEDURES  Critical Care performed: No   Procedure(s) performed:   Procedures   ____________________________________________   INITIAL  IMPRESSION / ASSESSMENT AND PLAN / ED COURSE  As part of my medical decision making, I reviewed the following data within the [place] History obtained from family, Nursing notes reviewed and incorporated, Labs reviewed  and EKG interpreted     The reason for the patient's visit is somewhat unclear at this time.  Differential of course includes ACS, infection, etc., but it is difficult to appreciate what is happening when the chief complaint is unclear.  Lab work obtained when she first checked and is notable only for profound hypokalemia at 2.3 as well as a low magnesium.  I began repletion with 40 mEq by mouth potassium and 2 g of IV magnesium.  I will attempt to clarify the situation with her son when he is back to the room.  No acute distress or indication of acute illness at this time.  Clinical Course as of Jul 20 1857  Tue Jul 20, 2017  1743 I was able to speak with the patient's son.  He is not certain where the report of the low white blood cell count came from.  He was told that the patient's right eye was red and that previously she had complained of some breast pain but she is saying that it is not hurting her now.  I updated him about the potassium and magnesium.  His preference is to not stay for the full 3 hours because of concerns about the roads icing over tonight and he wants to get home before that happens.  Again, I do not feel strongly  that she requires admission for potassium repletion.  Her troponin is 0.03 but she has been having no chest pain and no shortness of breath and I do not feel she would benefit from a repeat.  I will reassess based on the family's need about whether or not we should keep her for the full 3 doses of potassium or whether discharging her with potassium repletion by mouth would be sufficient  [CF]  1848 Patient has started the IV potassium but is on her first dose.  I discussed it with the son and we all agreed that it would be better for them to get home safely tonight and started on potassium supplements by mouth in the morning rather than to stay for the additional 2 hours.  They are comfortable with the plan for discharge and outpatient follow-up and would prefer the patient not be admitted.  I think this is appropriate and I gave my usual and customary return precautions  [CF]    Clinical Course User Index [CF] Hinda Kehr, MD    ____________________________________________  FINAL CLINICAL IMPRESSION(S) / ED DIAGNOSES  Final diagnoses:  Hypokalemia  Hypomagnesemia  Non-traumatic subconjunctival hemorrhage of right eye     MEDICATIONS GIVEN DURING THIS VISIT:  Medications  potassium chloride 10 mEq in 100 mL IVPB (10 mEq Intravenous New Bag/Given 07/20/17 1841)  potassium chloride SA (K-DUR,KLOR-CON) CR tablet 40 mEq (40 mEq Oral Given 07/20/17 1626)  magnesium sulfate IVPB 2 g 50 mL (0 g Intravenous Stopped 07/20/17 1838)     ED Discharge Orders        Ordered    potassium chloride SA (KLOR-CON M20) 20 MEQ tablet  2 times daily     07/20/17 1704       Note:  This document was prepared using Dragon voice recognition software and may include unintentional dictation errors.    Hinda Kehr, MD 07/20/17 [phone]

## 2017-07-20 NOTE — ED Triage Notes (Signed)
Patient presents to ED via POV from home with her son. Son reports patient has been short of breath and that they were told her WBC was low. Patient denies SOB at this time. Patient states, "only when I walk".

## 2017-07-20 NOTE — ED Notes (Signed)
Pt given water to drink. Son at bedside.

## 2017-07-20 NOTE — Telephone Encounter (Signed)
Pt's daughter Kristin Avila called; she states that her Mom's caregiver called saying that the patient had swelling in her right breast, right side of face, and membrane of right eye is swollen and reddened;  per her mom's caregiver; Kristin Avila the caregiver was conferenced called and states that the patient is not having any pain but occasional itching Kristin Avila and Kristin Avila both state that the patient has eaten peanut butter before without difficulty, and has had peanut butter after her symptoms were noticed and they did not worsen; the caregiver states that the pt has not fallen; pt triaged per nurse protocol and daughter would like for pt to see Dr Nicki Reaper; no appointments available with Dr Nicki Reaper so an appointment with Dr Derrel Nip was offered and accepted for 07/21/17 at 0900; daughter and caregiver verbalize understanding. Reason for Disposition . [1] Mild facial swelling (puffiness) AND [2] persists > 3 days  Answer Assessment - Initial Assessment Questions 1. ONSET: "When did the swelling start?" (e.g., minutes, hours, days)     1000 07/20/17 2. LOCATION: "What part of the face is swollen?"     Right side 3. SEVERITY: "How swollen is it?"     mild 4. ITCHING: "Is there any itching?" If so, ask: "How much?"   (Scale 1-10; mild, moderate or severe)     no 5. PAIN: "Is the swelling painful to touch?" If so, ask: "How painful is it?"   (Scale 1-10; mild, moderate or severe)     no 6. FEVER: "Do you have a fever?" If so, ask: "What is it, how was it measured, and when did it start?"     96.5 degree temple thermometer  7. CAUSE: "What do you think is causing the face swelling?"     unknown 8. RECURRENT SYMPTOM: "Have you had face swelling before?" If so, ask: "When was the last time?" "What happened that time?"     no 9. OTHER SYMPTOMS: "Do you have any other symptoms?" (e.g., toothache, leg swelling)     Right breast swollen like she has an implant; membrane in right eye is swollen, ; redness around genitals  (caregiver states that the pt tried to cut off her underwear a few days ago) 10. PREGNANCY: "Is there any chance you are pregnant?" "When was your last menstrual period?"       no  Protocols used: [facility]

## 2017-07-20 NOTE — Discharge Instructions (Signed)
Your workup in the Emergency Department today was reassuring.  You have a broken blood vessel in your right eye called a subconjunctival hemorrhage which is what is making it red; please read through the included information, but this is generally benign and resolves over time.  Your potassium level was quite low today, and we gave you potassium by mouth and by IV, as well as a prescription to take as an outpatient.  We recommend you drink plenty of fluids, take your regular medications and/or any new ones prescribed today, and follow up with the doctor(s) listed in these documents as recommended.  Your doctor will likely want to schedule some outpatient lab tests to make sure your potassium and magnesium levels are going back up.  Return to the Emergency Department if you develop new or worsening symptoms that concern you.

## 2017-07-20 NOTE — Telephone Encounter (Signed)
Conferred with Dr Larose Kells, on call MD for [place], and informed him that the pt takes eliquis; he stated that the patient should not wait for tomorrow's MD appoint and go to the ED for evaluation now; contacted pt's daughter [name] and relaed this information; she verbalizes underdstanding; I also stated that I will make Drs Nicki Reaper and Derrel Nip aware of this, and will cancel tomorrow's appoint.

## 2017-07-20 NOTE — ED Notes (Signed)
Dr. Forbach at bedside.  

## 2017-07-20 NOTE — ED Notes (Signed)
Pt is HOH.  Son states noticed R breast swollen yesterday morning from caregiver. Pt R eye is red. Pt denies pain at breast site. Denies discharge. Pt hooked up to cardiac monitor. R breast is larger than L breast on inspection. No redness noted on breast. No distress noted.

## 2017-07-21 ENCOUNTER — Ambulatory Visit: Payer: Medicare Other | Admitting: Internal Medicine

## 2017-07-21 NOTE — Telephone Encounter (Addendum)
Potassium , magnesium low was given potassium  For 14 days scheduled lab for next Tuesday for recheck on Potassium  Patient daughter stated she need to FU with you in omne week?

## 2017-07-21 NOTE — Telephone Encounter (Signed)
Reviewed chart.  Apparently daughter called yesterday concerned regarding facial swelling.  Was advised to go to ER.  ER evaluated, but per note was unclear why pt there (per their note).  Potassium was low.  Please confirm pt doing ok now with no swelling, etc.  Also need to know if was placed on potassium supplement.  Will need appt for f/u lab appt - to recheck potassium.

## 2017-07-21 NOTE — Telephone Encounter (Signed)
FYI   This has been sent to Dr. Nicki Reaper and Dr. Derrel Nip

## 2017-07-22 NOTE — Telephone Encounter (Signed)
After discussion, if this is a change since ER visit, I still think she needs evaluation.  Need to confirm no infection, etc - to cause acute neuro or mental status change.  Do not feel can prescribe medication until other acute issues ruled out.

## 2017-07-22 NOTE — Telephone Encounter (Signed)
If throwing her walker and becoming violent, this is an acute change for her.  Needs to go ahead and be evaluated to confirm nothing more acute going on.

## 2017-07-22 NOTE — Telephone Encounter (Signed)
Pt caregiver states that they can make the appt but wanted to know if there was anything they could giver her  She is now throwing her walker and becoming violent

## 2017-07-22 NOTE — Telephone Encounter (Signed)
The caregiver in the home says this has been going on since patient was last in the hospital that this is nothing new, sometimes she just gets ill throws walker and other objects say she just cannot do anything says PT has also witnessed these episodes. Patient daughter has not mentioned any of these thing going on.

## 2017-07-22 NOTE — Telephone Encounter (Signed)
I can see her at 12:00 on 07/27/17.  Please schedule and notify of appt date and time.

## 2017-07-25 ENCOUNTER — Other Ambulatory Visit: Payer: Self-pay

## 2017-07-25 ENCOUNTER — Emergency Department: Payer: Medicare Other

## 2017-07-25 ENCOUNTER — Inpatient Hospital Stay
Admission: EM | Admit: 2017-07-25 | Discharge: 2017-07-30 | DRG: 291 | Disposition: A | Discharge status: Skilled Nursing Facility | Payer: Medicare Other | Attending: Internal Medicine | Admitting: Internal Medicine

## 2017-07-25 ENCOUNTER — Encounter: Payer: Self-pay | Admitting: Emergency Medicine

## 2017-07-25 DIAGNOSIS — J9601 Acute respiratory failure with hypoxia: Secondary | ICD-10-CM | POA: Diagnosis present

## 2017-07-25 DIAGNOSIS — F419 Anxiety disorder, unspecified: Secondary | ICD-10-CM | POA: Diagnosis present

## 2017-07-25 DIAGNOSIS — I4891 Unspecified atrial fibrillation: Secondary | ICD-10-CM | POA: Diagnosis present

## 2017-07-25 DIAGNOSIS — E7800 Pure hypercholesterolemia, unspecified: Secondary | ICD-10-CM | POA: Diagnosis present

## 2017-07-25 DIAGNOSIS — Z6831 Body mass index (BMI) 31.0-31.9, adult: Secondary | ICD-10-CM

## 2017-07-25 DIAGNOSIS — I5023 Acute on chronic systolic (congestive) heart failure: Secondary | ICD-10-CM

## 2017-07-25 DIAGNOSIS — R197 Diarrhea, unspecified: Secondary | ICD-10-CM | POA: Diagnosis present

## 2017-07-25 DIAGNOSIS — R0603 Acute respiratory distress: Secondary | ICD-10-CM | POA: Diagnosis present

## 2017-07-25 DIAGNOSIS — E782 Mixed hyperlipidemia: Secondary | ICD-10-CM | POA: Diagnosis present

## 2017-07-25 DIAGNOSIS — I110 Hypertensive heart disease with heart failure: Secondary | ICD-10-CM | POA: Diagnosis not present

## 2017-07-25 DIAGNOSIS — Z79899 Other long term (current) drug therapy: Secondary | ICD-10-CM

## 2017-07-25 DIAGNOSIS — F0390 Unspecified dementia without behavioral disturbance: Secondary | ICD-10-CM | POA: Diagnosis present

## 2017-07-25 DIAGNOSIS — D509 Iron deficiency anemia, unspecified: Secondary | ICD-10-CM | POA: Diagnosis present

## 2017-07-25 DIAGNOSIS — K219 Gastro-esophageal reflux disease without esophagitis: Secondary | ICD-10-CM | POA: Diagnosis present

## 2017-07-25 DIAGNOSIS — N179 Acute kidney failure, unspecified: Secondary | ICD-10-CM | POA: Diagnosis present

## 2017-07-25 DIAGNOSIS — E876 Hypokalemia: Secondary | ICD-10-CM | POA: Diagnosis present

## 2017-07-25 DIAGNOSIS — R531 Weakness: Secondary | ICD-10-CM

## 2017-07-25 DIAGNOSIS — I5043 Acute on chronic combined systolic (congestive) and diastolic (congestive) heart failure: Secondary | ICD-10-CM | POA: Diagnosis present

## 2017-07-25 DIAGNOSIS — Z86718 Personal history of other venous thrombosis and embolism: Secondary | ICD-10-CM

## 2017-07-25 DIAGNOSIS — R809 Proteinuria, unspecified: Secondary | ICD-10-CM | POA: Diagnosis not present

## 2017-07-25 DIAGNOSIS — E871 Hypo-osmolality and hyponatremia: Secondary | ICD-10-CM | POA: Diagnosis present

## 2017-07-25 DIAGNOSIS — E860 Dehydration: Secondary | ICD-10-CM | POA: Diagnosis present

## 2017-07-25 DIAGNOSIS — M19049 Primary osteoarthritis, unspecified hand: Secondary | ICD-10-CM | POA: Diagnosis present

## 2017-07-25 DIAGNOSIS — E440 Moderate protein-calorie malnutrition: Secondary | ICD-10-CM | POA: Diagnosis present

## 2017-07-25 DIAGNOSIS — M19079 Primary osteoarthritis, unspecified ankle and foot: Secondary | ICD-10-CM | POA: Diagnosis present

## 2017-07-25 DIAGNOSIS — E119 Type 2 diabetes mellitus without complications: Secondary | ICD-10-CM | POA: Diagnosis present

## 2017-07-25 DIAGNOSIS — I509 Heart failure, unspecified: Secondary | ICD-10-CM

## 2017-07-25 DIAGNOSIS — R110 Nausea: Secondary | ICD-10-CM | POA: Diagnosis present

## 2017-07-25 DIAGNOSIS — J189 Pneumonia, unspecified organism: Secondary | ICD-10-CM | POA: Diagnosis present

## 2017-07-25 DIAGNOSIS — J90 Pleural effusion, not elsewhere classified: Secondary | ICD-10-CM | POA: Diagnosis present

## 2017-07-25 DIAGNOSIS — F05 Delirium due to known physiological condition: Secondary | ICD-10-CM | POA: Diagnosis present

## 2017-07-25 DIAGNOSIS — R748 Abnormal levels of other serum enzymes: Secondary | ICD-10-CM | POA: Diagnosis present

## 2017-07-25 DIAGNOSIS — Z85828 Personal history of other malignant neoplasm of skin: Secondary | ICD-10-CM

## 2017-07-25 DIAGNOSIS — Z7901 Long term (current) use of anticoagulants: Secondary | ICD-10-CM

## 2017-07-25 DIAGNOSIS — Z950 Presence of cardiac pacemaker: Secondary | ICD-10-CM

## 2017-07-25 LAB — CBC
HCT: 42.4 % (ref 35.0–47.0)
Hemoglobin: 13.4 g/dL (ref 12.0–16.0)
MCH: 28.7 pg (ref 26.0–34.0)
MCHC: 31.6 g/dL — ABNORMAL LOW (ref 32.0–36.0)
MCV: 91 fL (ref 80.0–100.0)
PLATELETS: 144 10*3/uL — AB (ref 150–440)
RBC: 4.66 MIL/uL (ref 3.80–5.20)
RDW: 26.2 % — AB (ref 11.5–14.5)
WBC: 9.5 10*3/uL (ref 3.6–11.0)

## 2017-07-25 LAB — URINALYSIS, COMPLETE (UACMP) WITH MICROSCOPIC
Bilirubin Urine: NEGATIVE
GLUCOSE, UA: NEGATIVE mg/dL
Hgb urine dipstick: NEGATIVE
KETONES UR: NEGATIVE mg/dL
LEUKOCYTES UA: NEGATIVE
Nitrite: NEGATIVE
PH: 5 (ref 5.0–8.0)
PROTEIN: 30 mg/dL — AB
RBC / HPF: NONE SEEN RBC/hpf (ref 0–5)
SPECIFIC GRAVITY, URINE: 1.01 (ref 1.005–1.030)

## 2017-07-25 LAB — DIFFERENTIAL
BASOS ABS: 0 10*3/uL (ref 0–0.1)
BASOS PCT: 0 %
Eosinophils Absolute: 0 10*3/uL (ref 0–0.7)
Eosinophils Relative: 0 %
Lymphocytes Relative: 9 %
Lymphs Abs: 0.8 10*3/uL — ABNORMAL LOW (ref 1.0–3.6)
MONOS PCT: 10 %
Monocytes Absolute: 0.9 10*3/uL (ref 0.2–0.9)
NEUTROS ABS: 7.6 10*3/uL — AB (ref 1.4–6.5)
NEUTROS PCT: 81 %

## 2017-07-25 LAB — TSH: TSH: 3.974 u[IU]/mL (ref 0.350–4.500)

## 2017-07-25 LAB — COMPREHENSIVE METABOLIC PANEL
ALBUMIN: 3 g/dL — AB (ref 3.5–5.0)
ALT: 92 U/L — AB (ref 14–54)
AST: 113 U/L — AB (ref 15–41)
Alkaline Phosphatase: 134 U/L — ABNORMAL HIGH (ref 38–126)
Anion gap: 13 (ref 5–15)
BUN: 24 mg/dL — AB (ref 6–20)
CHLORIDE: 101 mmol/L (ref 101–111)
CO2: 23 mmol/L (ref 22–32)
CREATININE: 1.45 mg/dL — AB (ref 0.44–1.00)
Calcium: 9.2 mg/dL (ref 8.9–10.3)
GFR calc Af Amer: 36 mL/min — ABNORMAL LOW (ref >60)
GFR, EST NON AFRICAN AMERICAN: 31 mL/min — AB (ref >60)
GLUCOSE: 86 mg/dL (ref 65–99)
Potassium: 4.2 mmol/L (ref 3.5–5.1)
SODIUM: 137 mmol/L (ref 135–145)
Total Bilirubin: 2.5 mg/dL — ABNORMAL HIGH (ref 0.3–1.2)
Total Protein: 5.7 g/dL — ABNORMAL LOW (ref 6.5–8.1)

## 2017-07-25 LAB — HEPATIC FUNCTION PANEL
ALK PHOS: 137 U/L — AB (ref 38–126)
ALT: 93 U/L — AB (ref 14–54)
AST: 115 U/L — AB (ref 15–41)
Albumin: 3.1 g/dL — ABNORMAL LOW (ref 3.5–5.0)
BILIRUBIN DIRECT: 1.2 mg/dL — AB (ref 0.1–0.5)
BILIRUBIN INDIRECT: 1 mg/dL — AB (ref 0.3–0.9)
BILIRUBIN TOTAL: 2.2 mg/dL — AB (ref 0.3–1.2)
Total Protein: 5.7 g/dL — ABNORMAL LOW (ref 6.5–8.1)

## 2017-07-25 LAB — GLUCOSE, CAPILLARY
GLUCOSE-CAPILLARY: 75 mg/dL (ref 65–99)
GLUCOSE-CAPILLARY: 69 mg/dL (ref 65–99)
GLUCOSE-CAPILLARY: 90 mg/dL (ref 65–99)

## 2017-07-25 LAB — BRAIN NATRIURETIC PEPTIDE: B Natriuretic Peptide: 1229 pg/mL — ABNORMAL HIGH (ref 0.0–100.0)

## 2017-07-25 LAB — MAGNESIUM: Magnesium: 1.8 mg/dL (ref 1.7–2.4)

## 2017-07-25 LAB — TROPONIN I
TROPONIN I: 0.03 ng/mL — AB (ref <0.03)
Troponin I: 0.03 ng/mL (ref <0.03)

## 2017-07-25 MED ORDER — ONDANSETRON HCL 4 MG/2ML IJ SOLN: 4.0000 mg | Freq: Four times a day (QID) | INTRAMUSCULAR | Status: DC | PRN

## 2017-07-25 MED ORDER — FUROSEMIDE 10 MG/ML IJ SOLN
20.0000 mg | Freq: Once | INTRAMUSCULAR | Status: AC
  Administered 2017-07-25: 20 mg via INTRAVENOUS
  Filled 2017-07-25: qty 4

## 2017-07-25 MED ORDER — APIXABAN 5 MG PO TABS
5.0000 mg | ORAL_TABLET | Freq: Two times a day (BID) | ORAL | Status: DC
  Administered 2017-07-25 – 2017-07-28 (×6): 5 mg via ORAL
  Filled 2017-07-25 (×6): qty 1

## 2017-07-25 MED ORDER — ONDANSETRON HCL 4 MG PO TABS: 4.0000 mg | ORAL_TABLET | Freq: Four times a day (QID) | ORAL | Status: DC | PRN

## 2017-07-25 MED ORDER — IPRATROPIUM-ALBUTEROL 0.5-2.5 (3) MG/3ML IN SOLN
3.0000 mL | Freq: Four times a day (QID) | RESPIRATORY_TRACT | Status: DC
  Administered 2017-07-26: 3 mL via RESPIRATORY_TRACT
  Filled 2017-07-25: qty 3

## 2017-07-25 MED ORDER — FLUOXETINE HCL 20 MG PO CAPS
40.0000 mg | ORAL_CAPSULE | Freq: Every day | ORAL | Status: DC
  Administered 2017-07-26 – 2017-07-30 (×4): 40 mg via ORAL
  Filled 2017-07-25 (×5): qty 2

## 2017-07-25 MED ORDER — PANTOPRAZOLE SODIUM 40 MG IV SOLR
40.0000 mg | Freq: Two times a day (BID) | INTRAVENOUS | Status: DC
  Administered 2017-07-25 – 2017-07-26 (×2): 40 mg via INTRAVENOUS
  Filled 2017-07-25 (×2): qty 40

## 2017-07-25 MED ORDER — MAGNESIUM OXIDE 400 (241.3 MG) MG PO TABS
400.0000 mg | ORAL_TABLET | Freq: Two times a day (BID) | ORAL | Status: DC
  Administered 2017-07-25 – 2017-07-29 (×9): 400 mg via ORAL
  Filled 2017-07-25 (×9): qty 1

## 2017-07-25 MED ORDER — FERROUS SULFATE 325 (65 FE) MG PO TABS
325.0000 mg | ORAL_TABLET | Freq: Two times a day (BID) | ORAL | Status: DC
  Administered 2017-07-26 – 2017-07-30 (×8): 325 mg via ORAL
  Filled 2017-07-25 (×8): qty 1

## 2017-07-25 MED ORDER — FUROSEMIDE 10 MG/ML IJ SOLN
40.0000 mg | Freq: Two times a day (BID) | INTRAMUSCULAR | Status: DC
  Filled 2017-07-25: qty 4

## 2017-07-25 MED ORDER — VITAMIN D 1000 UNITS PO TABS
1000.0000 [IU] | ORAL_TABLET | Freq: Every day | ORAL | Status: DC
  Administered 2017-07-26 – 2017-07-30 (×5): 1000 [IU] via ORAL
  Filled 2017-07-25 (×5): qty 1

## 2017-07-25 MED ORDER — PRAVASTATIN SODIUM 20 MG PO TABS
20.0000 mg | ORAL_TABLET | Freq: Every day | ORAL | Status: DC
  Administered 2017-07-26 – 2017-07-29 (×4): 20 mg via ORAL
  Filled 2017-07-25 (×4): qty 1

## 2017-07-25 MED ORDER — DOCUSATE SODIUM 100 MG PO CAPS
100.0000 mg | ORAL_CAPSULE | Freq: Two times a day (BID) | ORAL | Status: DC
  Administered 2017-07-26 – 2017-07-30 (×8): 100 mg via ORAL
  Filled 2017-07-25 (×8): qty 1

## 2017-07-25 MED ORDER — ACETAMINOPHEN 325 MG PO TABS
650.0000 mg | ORAL_TABLET | Freq: Four times a day (QID) | ORAL | Status: DC | PRN
  Administered 2017-07-29: 650 mg via ORAL
  Filled 2017-07-25: qty 2

## 2017-07-25 MED ORDER — METOPROLOL SUCCINATE ER 50 MG PO TB24: 50.0000 mg | ORAL_TABLET | Freq: Every day | ORAL | Status: DC

## 2017-07-25 MED ORDER — INSULIN ASPART 100 UNIT/ML ~~LOC~~ SOLN
0.0000 [IU] | Freq: Three times a day (TID) | SUBCUTANEOUS | Status: DC
  Administered 2017-07-29: 1 [IU] via SUBCUTANEOUS
  Filled 2017-07-25: qty 1

## 2017-07-25 MED ORDER — TRAZODONE HCL 100 MG PO TABS
100.0000 mg | ORAL_TABLET | Freq: Every evening | ORAL | Status: DC | PRN
  Administered 2017-07-25 – 2017-07-29 (×4): 100 mg via ORAL
  Filled 2017-07-25 (×4): qty 1

## 2017-07-25 MED ORDER — AMIODARONE HCL 200 MG PO TABS
200.0000 mg | ORAL_TABLET | Freq: Two times a day (BID) | ORAL | Status: DC
  Administered 2017-07-25: 200 mg via ORAL
  Filled 2017-07-25: qty 1

## 2017-07-25 MED ORDER — POTASSIUM CHLORIDE CRYS ER 20 MEQ PO TBCR
20.0000 meq | EXTENDED_RELEASE_TABLET | Freq: Two times a day (BID) | ORAL | Status: DC
  Administered 2017-07-25 – 2017-07-26 (×3): 20 meq via ORAL
  Filled 2017-07-25 (×4): qty 1

## 2017-07-25 MED ORDER — BISACODYL 10 MG RE SUPP
10.0000 mg | Freq: Every day | RECTAL | Status: DC | PRN
Start: 2017-07-25 — End: 2017-07-30

## 2017-07-25 MED ORDER — DEXTROSE 5 % IV SOLN
500.0000 mg | INTRAVENOUS | Status: DC
  Administered 2017-07-26: 500 mg via INTRAVENOUS
  Filled 2017-07-25 (×3): qty 500

## 2017-07-25 MED ORDER — ACETAMINOPHEN 650 MG RE SUPP: 650.0000 mg | Freq: Four times a day (QID) | RECTAL | Status: DC | PRN

## 2017-07-25 NOTE — ED Provider Notes (Signed)
[facility] Emergency Department Provider Note   ____________________________________________   First MD Initiated Contact with Patient 07/25/17 1515     (approximate)  I have reviewed the triage vital signs and the nursing notes.   HISTORY  Chief Complaint Weakness   HPI Kristin Avila is a 83 y.o. female Who was here last week for similar complaint. She turned out to be having electrolyte abnormalities and low potassium and magnesium. Patient went home but has gotten progressively weaker. She initially was walking with a walker but now is just confined to wheelchair. She lives at home has someone with her. She is not hungry and is not eating much. She is drinking fluids. She denies any trouble swallowing. She says she is very weak. She does not have any pain anywhere. She is not short of breath.   Past Medical History:  Diagnosis Date  . Anemia   . Anxiety 10/02/2012  . Atrial fibrillation ([place]) 11/13   new onset 11/13  . BCC (basal cell carcinoma), face 04/24/2016  . Benign essential hypertension 03/13/2015  . Cellulitis of right upper extremity 01/27/2017  . Constipation 01/13/2015  . Decreased hearing 05/21/2015  . Health care maintenance 10/14/2014   Mammogram 10/01/15 - Birads I.    . Hypercholesterolemia   . Hyperlipidemia, mixed 02/01/2017  . Hypertension   . Osteoarthritis    hands, feet  . Sick sinus syndrome ([place]) 01/04/2017   Overview:  Sp pacer placement 2018  . Syncope 12/08/2016    Patient Active Problem List   Diagnosis Date Noted  . Sleeping difficulty 05/29/2017  . Symptomatic anemia 05/29/2017  . Iron deficiency anemia   . Iron deficiency anemia due to chronic blood loss   . DVT (deep venous thrombosis) ([place]) 05/20/2017  . Community acquired pneumonia 05/20/2017  . Encephalopathy acute 04/29/2017  . Pleural effusion on right 04/18/2017  . Hypotension 04/18/2017  . Hyperlipidemia, mixed 02/01/2017  . Cellulitis of right upper  extremity 01/27/2017  . Sick sinus syndrome ([place]) 01/04/2017  . Syncope 12/08/2016  . BCC (basal cell carcinoma), face 04/24/2016  . Decreased hearing 05/21/2015  . Benign essential hypertension 03/13/2015  . Constipation 01/13/2015  . Health care maintenance 10/14/2014  . Anxiety 10/02/2012  . Atrial fibrillation ([place]) 07/09/2012  . Osteoarthritis 07/08/2012  . Hypercholesterolemia 07/08/2012  . Hypertension 07/08/2012    Past Surgical History:  Procedure Laterality Date  . APPENDECTOMY  1957  . CATARACT EXTRACTION  2013   left eye  . COLONOSCOPY  09/06/2008  . COLONOSCOPY WITH PROPOFOL N/A 05/23/2017   Procedure: COLONOSCOPY WITH PROPOFOL;  Surgeon: Lucilla Lame, MD;  Location: [facility] ENDOSCOPY;  Service: Endoscopy;  Laterality: N/A;  . ESOPHAGOGASTRODUODENOSCOPY (EGD) WITH PROPOFOL N/A 05/23/2017   Procedure: ESOPHAGOGASTRODUODENOSCOPY (EGD) WITH PROPOFOL;  Surgeon: Lucilla Lame, MD;  Location: [facility] ENDOSCOPY;  Service: Endoscopy;  Laterality: N/A;  . INCISION AND DRAINAGE ABSCESS Right 01/29/2017   Procedure: INCISION AND DRAINAGE ABSCESS;  Surgeon: Florene Glen, MD;  Location: [facility] ORS;  Service: General;  Laterality: Right;  . KIDNEY SURGERY  age 43  . PACEMAKER INSERTION Left 12/14/2016   Procedure: INSERTION PACEMAKER;  Surgeon: Isaias Cowman, MD;  Location: [facility] ORS;  Service: Cardiovascular;  Laterality: Left;  . TUBAL LIGATION    . VIDEO ASSISTED THORACOSCOPY (VATS)/THOROCOTOMY Right 04/19/2017   Procedure: RIGHT THORACOSCOPY AND EVACUATION OF HEMOTHORAX;  Surgeon: Nestor Lewandowsky, MD;  Location: [facility] ORS;  Service: General;  Laterality: Right;    Prior to Admission medications  Medication Sig Start Date End Date Taking? Authorizing Provider  acetaminophen (TYLENOL) 325 MG tablet Take 2 tablets (650 mg total) by mouth every 6 (six) hours as needed for mild pain, moderate pain or fever (or Fever >/= 101). 12/11/16  Yes Gouru, Illene Silver, MD  amiodarone (PACERONE) 200 MG  tablet TAKE 1 TABLET BY MOUTH TWICE A DAY 06/21/17  Yes Einar Pheasant, MD  apixaban (ELIQUIS) 5 MG TABS tablet Take 1 tablet (5 mg total) by mouth 2 (two) times daily. 05/30/17  Yes Mody, Ulice Bold, MD  bisacodyl (DULCOLAX) 5 MG EC tablet Take 1 tablet (5 mg total) by mouth daily as needed for moderate constipation. 04/28/17  Yes Gouru, Illene Silver, MD  Cholecalciferol (VITAMIN D-3) 1000 UNITS CAPS Take 1 capsule by mouth daily.    Yes [provider]  ferrous sulfate 325 (65 FE) MG tablet Take 1 tablet (325 mg total) by mouth 2 (two) times daily with a meal. 05/30/17  Yes Fritzi Mandes, MD  FLUoxetine (PROZAC) 40 MG capsule Take 1 capsule (40 mg total) by mouth daily. 01/18/17  Yes Einar Pheasant, MD  lovastatin (MEVACOR) 20 MG tablet Take 1 tablet (20 mg total) by mouth daily. 06/30/17  Yes Einar Pheasant, MD  metoprolol succinate (TOPROL-XL) 50 MG 24 hr tablet TAKE (1) TABLET BY MOUTH DAILY. TAKE WITH OR IMMEDIATELY FOLLOWING A MEAL 01/19/17  Yes Einar Pheasant, MD  potassium chloride SA (KLOR-CON M20) 20 MEQ tablet Take 1 tablet (20 mEq total) by mouth 2 (two) times daily for 14 days. 07/20/17 08/03/17 Yes Hinda Kehr, MD  senna-docusate (SENOKOT-S) 8.6-50 MG tablet Take 1 tablet by mouth at bedtime as needed for mild constipation. 12/11/16  Yes Gouru, Illene Silver, MD  torsemide (DEMADEX) 5 MG tablet TAKE 1 TABLET BY MOUTH EVERY DAY 06/21/17  Yes Einar Pheasant, MD  traZODone (DESYREL) 100 MG tablet TAKE 1 TABLET (100 MG TOTAL) BY MOUTH AT BEDTIME AS NEEDED FOR SLEEP. 06/25/17  Yes Einar Pheasant, MD  albuterol (PROVENTIL) (2.5 MG/3ML) 0.083% nebulizer solution Take 3 mLs (2.5 mg total) by nebulization every 2 (two) hours as needed for wheezing. Patient not taking: Reported on 07/25/2017 04/28/17   Nicholes Mango, MD  HYDROcodone-acetaminophen (NORCO/VICODIN) 5-325 MG tablet Take 1-2 tablets by mouth every 6 (six) hours as needed for moderate pain. Patient not taking: Reported on 07/25/2017 04/29/17    Toni Arthurs, NP    Allergies Patient has no known allergies.  Family History  Problem Relation Age of Onset  . Congestive Heart Failure Father   . Multiple myeloma Mother   . Hypertension Brother   . Alzheimer's disease Unknown        grandmother and great aunts  . Breast cancer Neg Hx   . Colon cancer Neg Hx     Social History Social History   Tobacco Use  . Smoking status: Never Smoker  . Smokeless tobacco: Never Used  Substance Use Topics  . Alcohol use: No    Alcohol/week: 0.0 oz    Comment: occasional  . Drug use: No    Review of Systems  Constitutional: No fever/chills Eyes: No visual changes. ENT: No sore throat. Cardiovascular: Denies chest pain. Respiratory: Denies shortness of breath. Gastrointestinal: No abdominal pain.  No nausea, no vomiting.  No diarrhea.  No constipation. Genitourinary: Negative for dysuria. Musculoskeletal: Negative for back pain. Skin: Negative for rash. Neurological: Negative for headaches, focal weakness   ____________________________________________   PHYSICAL EXAM:  VITAL SIGNS: ED Triage Vitals  Enc Vitals Group  BP 07/25/17 1500 122/74     Pulse Rate 07/25/17 1500 66     Resp 07/25/17 1500 19     Temp 07/25/17 1504 97.6 F (36.4 C)     Temp Source 07/25/17 1504 Oral     SpO2 07/25/17 1500 (!) 87 %     Weight 07/25/17 1454 170 lb (77.1 kg)     Height 07/25/17 1454 5' 2"  (1.575 m)     Head Circumference --      Peak Flow --      Pain Score 07/25/17 1505 0     Pain Loc --      Pain Edu? --      Excl. in [place]? --     Constitutional: Alert and oriented. Well appearing and in no acute distress. Eyes: Conjunctivae are normal. PER. EOMI. Head: Atraumatic. Nose: No congestion/rhinnorhea. Mouth/Throat: Mucous membranes are slightly dry.  Oropharynx non-erythematous. Neck: No stridor.   Cardiovascular: Normal rate, regular rhythm. Grossly normal heart sounds.  Good peripheral circulation. Respiratory: Normal  respiratory effort.  No retractions. Lungs CTAB. Gastrointestinal: Soft and nontender. No distention. No abdominal bruits. No CVA tenderness. Musculoskeletal: No lower extremity tenderness trace edema.  No joint effusions. Neurologic:  Normal speech and language. No gross focal neurologic deficits are appreciated.  Skin:  Skin is warm, dry and intact. No rash noted. Psychiatric: Mood and affect are normal. Speech and behavior are normal.  ____________________________________________   LABS (all labs ordered are listed, but only abnormal results are displayed)  Labs Reviewed  CBC - Abnormal; Notable for the following components:      Result Value   MCHC 31.6 (*)    RDW 26.2 (*)    Platelets 144 (*)    All other components within normal limits  URINALYSIS, COMPLETE (UACMP) WITH MICROSCOPIC - Abnormal; Notable for the following components:   Color, Urine YELLOW (*)    APPearance CLEAR (*)    Protein, ur 30 (*)    Bacteria, UA RARE (*)    Squamous Epithelial / LPF 0-5 (*)    All other components within normal limits  DIFFERENTIAL - Abnormal; Notable for the following components:   Neutro Abs 7.6 (*)    Lymphs Abs 0.8 (*)    All other components within normal limits  COMPREHENSIVE METABOLIC PANEL - Abnormal; Notable for the following components:   BUN 24 (*)    Creatinine, Ser 1.45 (*)    Total Protein 5.7 (*)    Albumin 3.0 (*)    AST 113 (*)    ALT 92 (*)    Alkaline Phosphatase 134 (*)    Total Bilirubin 2.5 (*)    GFR calc non Af Amer 31 (*)    GFR calc Af Amer 36 (*)    All other components within normal limits  TROPONIN I - Abnormal; Notable for the following components:   Troponin I 0.03 (*)    All other components within normal limits  HEPATIC FUNCTION PANEL - Abnormal; Notable for the following components:   Total Protein 5.7 (*)    Albumin 3.1 (*)    AST 115 (*)    ALT 93 (*)    Alkaline Phosphatase 137 (*)    Total Bilirubin 2.2 (*)    Bilirubin, Direct 1.2  (*)    Indirect Bilirubin 1.0 (*)    All other components within normal limits  BRAIN NATRIURETIC PEPTIDE - Abnormal; Notable for the following components:   B Natriuretic Peptide 1,229.0 (*)  All other components within normal limits  TSH  MAGNESIUM  CBG MONITORING, ED   ____________________________________________  EKG  EKG read and interpreted by me shows a fully paced rhythm with a rate of 100 appears to be similar to last week's EKG. ____________________________________________  RADIOLOGY  chest x-ray read as cardiomegaly and pulmonary vascular congestion with infiltrates or effusions.  ____________________________________________   PROCEDURES  Procedure(s) performed:   Procedures  Critical Care performed:   ____________________________________________   INITIAL IMPRESSION / ASSESSMENT AND PLAN / ED COURSE patient is very weak and cannot longer get around at home. This is new is started within the last several days. Her chest x-ray looks much worse than previous one. Her BUN and creatinine of increased since last time as well and she is taking furosemide but she appears to have worsening CHF. O2 sats fall to 87 in the room a couple times while I watched her. I will start some Lasix IV and plan on admitting her to the hospital for further treatment and close monitoring of the BUN and creatinine and possibly even some physical therapy.       ____________________________________________   FINAL CLINICAL IMPRESSION(S) / ED DIAGNOSES  Final diagnoses:  Weakness  Acute on chronic systolic congestive heart failure [facility])     ED Discharge Orders    None       Note:  This document was prepared using Dragon voice recognition software and may include unintentional dictation errors.    Nena Polio, MD 07/25/17 [id number]

## 2017-07-25 NOTE — Progress Notes (Signed)
The patient is admitted to room 239 with the diagnosis of acute Kidney Injury. Alert and oriented x 3. Denied any acute pain. Fall risk assessed, bed alarm activated and the bed is in the lowest position.  Tele monitoring applied and called to CCMD. Skin assessment done with Deedra Ehrich RN noted excoriation on right posterior thigh. Patient and family voiced no concerns. Will continue to monitor.

## 2017-07-25 NOTE — ED Notes (Signed)
Patient transported to CT 

## 2017-07-25 NOTE — ED Notes (Signed)
ED Provider at bedside. 

## 2017-07-25 NOTE — ED Notes (Signed)
Admission MD at bedside with patient and family.

## 2017-07-25 NOTE — H&P (Signed)
History and Physical    Kristin Avila YVO:[id number] DOB: 1927/10/05 DOA: 07/25/2017  Referring physician: Dr. Cinda Quest PCP: Einar Pheasant, MD  Specialists: Dr. Nehemiah Massed  Chief Complaint: weakness  HPI: Kristin Avila is a 83 y.o. female has a past medical history significant for SSS s/p pacer, HTN, DM who was recently admitted for pleural effusion and weakness with hyponatremia, hypokalemia, and hypomagnesemia. Now presents with progressive weakness with anorexia and SOB. No fever or cough. Denies CP. In ER, pt is hypoxic. BUN/Cr worse c/w AKI. CXR worse with bilateral infiltrates/effusions. She is now admitted. Does have nausea and some diarrhea. No vomiting  Review of Systems: The patient denies, fever, weight loss,, vision loss, decreased hearing, hoarseness, chest pain, syncope, peripheral edema, balance deficits, hemoptysis, abdominal pain, melena, hematochezia, severe indigestion/heartburn, hematuria, incontinence, genital sores, muscle weakness, suspicious skin lesions, transient blindness, depression, unusual weight change, abnormal bleeding, enlarged lymph nodes, angioedema, and breast masses.   Past Medical History:  Diagnosis Date  . Anemia   . Anxiety 10/02/2012  . Atrial fibrillation ([place]) 11/13   new onset 11/13  . BCC (basal cell carcinoma), face 04/24/2016  . Benign essential hypertension 03/13/2015  . Cellulitis of right upper extremity 01/27/2017  . Constipation 01/13/2015  . Decreased hearing 05/21/2015  . Health care maintenance 10/14/2014   Mammogram 10/01/15 - Birads I.    . Hypercholesterolemia   . Hyperlipidemia, mixed 02/01/2017  . Hypertension   . Osteoarthritis    hands, feet  . Sick sinus syndrome ([place]) 01/04/2017   Overview:  Sp pacer placement 2018  . Syncope 12/08/2016   Past Surgical History:  Procedure Laterality Date  . APPENDECTOMY  1957  . CATARACT EXTRACTION  2013   left eye  . COLONOSCOPY  09/06/2008  . COLONOSCOPY WITH PROPOFOL N/A  05/23/2017   Procedure: COLONOSCOPY WITH PROPOFOL;  Surgeon: Lucilla Lame, MD;  Location: [facility] ENDOSCOPY;  Service: Endoscopy;  Laterality: N/A;  . ESOPHAGOGASTRODUODENOSCOPY (EGD) WITH PROPOFOL N/A 05/23/2017   Procedure: ESOPHAGOGASTRODUODENOSCOPY (EGD) WITH PROPOFOL;  Surgeon: Lucilla Lame, MD;  Location: [facility] ENDOSCOPY;  Service: Endoscopy;  Laterality: N/A;  . INCISION AND DRAINAGE ABSCESS Right 01/29/2017   Procedure: INCISION AND DRAINAGE ABSCESS;  Surgeon: Florene Glen, MD;  Location: [facility] ORS;  Service: General;  Laterality: Right;  . KIDNEY SURGERY  age 64  . PACEMAKER INSERTION Left 12/14/2016   Procedure: INSERTION PACEMAKER;  Surgeon: Isaias Cowman, MD;  Location: [facility] ORS;  Service: Cardiovascular;  Laterality: Left;  . TUBAL LIGATION    . VIDEO ASSISTED THORACOSCOPY (VATS)/THOROCOTOMY Right 04/19/2017   Procedure: RIGHT THORACOSCOPY AND EVACUATION OF HEMOTHORAX;  Surgeon: Nestor Lewandowsky, MD;  Location: [facility] ORS;  Service: General;  Laterality: Right;   Social History:  reports that  has never smoked. she has never used smokeless tobacco. She reports that she does not drink alcohol or use drugs.  No Known Allergies  Family History  Problem Relation Age of Onset  . Congestive Heart Failure Father   . Multiple myeloma Mother   . Hypertension Brother   . Alzheimer's disease Unknown        grandmother and great aunts  . Breast cancer Neg Hx   . Colon cancer Neg Hx     Prior to Admission medications   Medication Sig Start Date End Date Taking? Authorizing Provider  acetaminophen (TYLENOL) 325 MG tablet Take 2 tablets (650 mg total) by mouth every 6 (six) hours as needed for mild pain, moderate pain or fever (or  Fever >/= 101). 12/11/16  Yes Gouru, Illene Silver, MD  amiodarone (PACERONE) 200 MG tablet TAKE 1 TABLET BY MOUTH TWICE A DAY 06/21/17  Yes Einar Pheasant, MD  apixaban (ELIQUIS) 5 MG TABS tablet Take 1 tablet (5 mg total) by mouth 2 (two) times daily. 05/30/17  Yes  Mody, Ulice Bold, MD  bisacodyl (DULCOLAX) 5 MG EC tablet Take 1 tablet (5 mg total) by mouth daily as needed for moderate constipation. 04/28/17  Yes Gouru, Illene Silver, MD  Cholecalciferol (VITAMIN D-3) 1000 UNITS CAPS Take 1 capsule by mouth daily.    Yes [provider]  ferrous sulfate 325 (65 FE) MG tablet Take 1 tablet (325 mg total) by mouth 2 (two) times daily with a meal. 05/30/17  Yes Fritzi Mandes, MD  FLUoxetine (PROZAC) 40 MG capsule Take 1 capsule (40 mg total) by mouth daily. 01/18/17  Yes Einar Pheasant, MD  lovastatin (MEVACOR) 20 MG tablet Take 1 tablet (20 mg total) by mouth daily. 06/30/17  Yes Einar Pheasant, MD  metoprolol succinate (TOPROL-XL) 50 MG 24 hr tablet TAKE (1) TABLET BY MOUTH DAILY. TAKE WITH OR IMMEDIATELY FOLLOWING A MEAL 01/19/17  Yes Einar Pheasant, MD  potassium chloride SA (KLOR-CON M20) 20 MEQ tablet Take 1 tablet (20 mEq total) by mouth 2 (two) times daily for 14 days. 07/20/17 08/03/17 Yes Hinda Kehr, MD  senna-docusate (SENOKOT-S) 8.6-50 MG tablet Take 1 tablet by mouth at bedtime as needed for mild constipation. 12/11/16  Yes Gouru, Illene Silver, MD  torsemide (DEMADEX) 5 MG tablet TAKE 1 TABLET BY MOUTH EVERY DAY 06/21/17  Yes Einar Pheasant, MD  traZODone (DESYREL) 100 MG tablet TAKE 1 TABLET (100 MG TOTAL) BY MOUTH AT BEDTIME AS NEEDED FOR SLEEP. 06/25/17  Yes Einar Pheasant, MD  albuterol (PROVENTIL) (2.5 MG/3ML) 0.083% nebulizer solution Take 3 mLs (2.5 mg total) by nebulization every 2 (two) hours as needed for wheezing. Patient not taking: Reported on 07/25/2017 04/28/17   Nicholes Mango, MD  HYDROcodone-acetaminophen (NORCO/VICODIN) 5-325 MG tablet Take 1-2 tablets by mouth every 6 (six) hours as needed for moderate pain. Patient not taking: Reported on 07/25/2017 04/29/17   Toni Arthurs, NP   Physical Exam: Vitals:   07/25/17 1800 07/25/17 1830 07/25/17 1900 07/25/17 1930  BP: 115/75 118/78 133/89 122/76  Pulse: 76 67 69 69  Resp: 14 19 20  (!) 23   Temp:      TempSrc:      SpO2: 92% 97% 99% 93%  Weight:      Height:         General:  No apparent distress, WDWN, [place]/AT  Eyes: PERRL, EOMI, no scleral icterus, conjunctiva clear  ENT: moist oropharynx without exudate, TM's benign, dentition fair  Neck: supple, no lymphadenopathy. No bruits or thyromegaly  Cardiovascular: regular rate without MRG; 2+ peripheral pulses, no JVD, trace peripheral edema  Respiratory: decreased breath sounds at the bases with scattered rhonchi. No rales or wheezes. Respiratory effort increased  Abdomen: soft, non tender to palpation, positive bowel sounds, no guarding, no rebound  Skin: no rashes or lesions  Musculoskeletal: normal bulk and tone, no joint swelling  Psychiatric: normal mood and affect, mildly lethargic, oriented to person and place  Neurologic: CN 2-12 grossly intact, Motor strength 5/5 in all 4 groups with symmetric DTR's and non-focal sensory exam  Labs on Admission:  Basic Metabolic Panel: Recent Labs  Lab 07/20/17 1450 07/25/17 1724  NA 138 137  K 2.3* 4.2  CL 100* 101  CO2 27 23  GLUCOSE  110* 86  BUN 17 24*  CREATININE 1.15* 1.45*  CALCIUM 8.8* 9.2  MG 1.5* 1.8   Liver Function Tests: Recent Labs  Lab 07/25/17 1724  AST 113*  115*  ALT 92*  93*  ALKPHOS 134*  137*  BILITOT 2.5*  2.2*  PROT 5.7*  5.7*  ALBUMIN 3.0*  3.1*   No results for input(s): LIPASE, AMYLASE in the last 168 hours. No results for input(s): AMMONIA in the last 168 hours. CBC: Recent Labs  Lab 07/20/17 1450 07/25/17 1456  WBC 7.1 9.5  NEUTROABS  --  7.6*  HGB 13.1 13.4  HCT 40.7 42.4  MCV 89.5 91.0  PLT 183 144*   Cardiac Enzymes: Recent Labs  Lab 07/20/17 1450 07/25/17 1724  TROPONINI 0.03* 0.03*    BNP (last 3 results) Recent Labs    05/19/17 1933 07/25/17 1456  BNP 930.0* 1,229.0*    ProBNP (last 3 results) No results for input(s): PROBNP in the last 8760 hours.  CBG: No results for input(s): GLUCAP  in the last 168 hours.  Radiological Exams on Admission: Dg Chest Portable 1 View  Result Date: 07/25/2017 CLINICAL DATA:  Weakness EXAM: PORTABLE CHEST 1 VIEW COMPARISON:  05/19/2017 FINDINGS: Cardiomegaly. Loculated right pleural effusion and small layering left pleural effusion noted. Bilateral lower lobe airspace opacities. Mild vascular congestion. No acute bony abnormality. Left pacer is unchanged. IMPRESSION: Moderate loculated right pleural effusion has increased since prior study. Small layering left pleural effusion. Bilateral lower lobe atelectasis or infiltrates. Cardiomegaly, vascular congestion. Electronically Signed   By: Rolm Baptise M.D.   On: 07/25/2017 16:08    EKG: Independently reviewed.  Assessment/Plan Principal Problem:   AKI (acute kidney injury) ([place]) Active Problems:   Pleural effusion on right   Community acquired pneumonia   Acute respiratory distress   Will admit to floor with telemetry and IV Lasix with O2, SVN's and empiric IV ABX. CT chest and echo ordered. Consult Cardiology. Follow sugars. Repeat labs in AM.  Diet: low salt Fluids: saline lock DVT Prophylaxis: SQ Heparin  Code Status: FULL  Family Communication: yes  Disposition Plan: SNF  Time spent: 50 min

## 2017-07-25 NOTE — ED Notes (Signed)
Radiology at bedside

## 2017-07-25 NOTE — ED Triage Notes (Signed)
Pt comes into the ED via [facility] from home c/o generalized weakness that has gotten worse.  Patient recently released from admission of the hospital for electrolyte imbalances with her potassium and magnesium.  Patient had stable VS.  H/o afib and her pacemaker was pacing with EMS.  Patient denies any pain or nausea at this time.

## 2017-07-25 NOTE — ED Notes (Signed)
Patient returned from radiology

## 2017-07-26 ENCOUNTER — Other Ambulatory Visit: Payer: Self-pay

## 2017-07-26 LAB — TROPONIN I
TROPONIN I: 0.03 ng/mL — AB (ref <0.03)
Troponin I: <0.03 ng/mL (ref <0.03)

## 2017-07-26 LAB — COMPREHENSIVE METABOLIC PANEL WITH GFR
ALT: 96 U/L — ABNORMAL HIGH (ref 14–54)
AST: 123 U/L — ABNORMAL HIGH (ref 15–41)
Albumin: 2.9 g/dL — ABNORMAL LOW (ref 3.5–5.0)
Alkaline Phosphatase: 139 U/L — ABNORMAL HIGH (ref 38–126)
Anion gap: 12 (ref 5–15)
BUN: 25 mg/dL — ABNORMAL HIGH (ref 6–20)
CO2: 21 mmol/L — ABNORMAL LOW (ref 22–32)
Calcium: 8.9 mg/dL (ref 8.9–10.3)
Chloride: 103 mmol/L (ref 101–111)
Creatinine, Ser: 1.32 mg/dL — ABNORMAL HIGH (ref 0.44–1.00)
GFR calc Af Amer: 41 mL/min — ABNORMAL LOW
GFR calc non Af Amer: 35 mL/min — ABNORMAL LOW
Glucose, Bld: 75 mg/dL (ref 65–99)
Potassium: 4 mmol/L (ref 3.5–5.1)
Sodium: 136 mmol/L (ref 135–145)
Total Bilirubin: 2.4 mg/dL — ABNORMAL HIGH (ref 0.3–1.2)
Total Protein: 5.4 g/dL — ABNORMAL LOW (ref 6.5–8.1)

## 2017-07-26 LAB — CBC
HCT: 40.1 % (ref 35.0–47.0)
HEMOGLOBIN: 12.7 g/dL (ref 12.0–16.0)
MCH: 28.5 pg (ref 26.0–34.0)
MCHC: 31.6 g/dL — ABNORMAL LOW (ref 32.0–36.0)
MCV: 90.2 fL (ref 80.0–100.0)
PLATELETS: 167 10*3/uL (ref 150–440)
RBC: 4.44 MIL/uL (ref 3.80–5.20)
RDW: 26.2 % — ABNORMAL HIGH (ref 11.5–14.5)
WBC: 7.2 10*3/uL (ref 3.6–11.0)

## 2017-07-26 LAB — GLUCOSE, CAPILLARY
GLUCOSE-CAPILLARY: 79 mg/dL (ref 65–99)
Glucose-Capillary: 110 mg/dL — ABNORMAL HIGH (ref 65–99)
Glucose-Capillary: 81 mg/dL (ref 65–99)
Glucose-Capillary: 100 mg/dL — ABNORMAL HIGH (ref 65–99)
Glucose-Capillary: 95 mg/dL (ref 65–99)

## 2017-07-26 MED ORDER — FUROSEMIDE 10 MG/ML IJ SOLN
20.0000 mg | Freq: Two times a day (BID) | INTRAMUSCULAR | Status: DC
  Administered 2017-07-26 – 2017-07-27 (×2): 20 mg via INTRAVENOUS
  Filled 2017-07-26 (×2): qty 2

## 2017-07-26 MED ORDER — AZITHROMYCIN 250 MG PO TABS
250.0000 mg | ORAL_TABLET | Freq: Every day | ORAL | Status: AC
  Administered 2017-07-27 – 2017-07-30 (×4): 250 mg via ORAL
  Filled 2017-07-26 (×4): qty 1

## 2017-07-26 MED ORDER — SODIUM CHLORIDE 0.9% FLUSH
3.0000 mL | Freq: Two times a day (BID) | INTRAVENOUS | Status: DC
  Administered 2017-07-26 – 2017-07-30 (×7): 3 mL via INTRAVENOUS

## 2017-07-26 MED ORDER — ENSURE ENLIVE PO LIQD
237.0000 mL | Freq: Two times a day (BID) | ORAL | Status: DC
  Administered 2017-07-28 – 2017-07-30 (×5): 237 mL via ORAL

## 2017-07-26 MED ORDER — IPRATROPIUM-ALBUTEROL 0.5-2.5 (3) MG/3ML IN SOLN: 3.0000 mL | RESPIRATORY_TRACT | Status: DC | PRN

## 2017-07-26 MED ORDER — PANTOPRAZOLE SODIUM 40 MG PO TBEC
40.0000 mg | DELAYED_RELEASE_TABLET | Freq: Two times a day (BID) | ORAL | Status: DC
  Administered 2017-07-27 – 2017-07-30 (×7): 40 mg via ORAL
  Filled 2017-07-26 (×7): qty 1

## 2017-07-26 MED ORDER — METOPROLOL SUCCINATE ER 50 MG PO TB24
50.0000 mg | ORAL_TABLET | Freq: Every day | ORAL | Status: DC
Start: 2017-07-26 — End: 2017-07-30
  Administered 2017-07-26 – 2017-07-30 (×3): 50 mg via ORAL
  Filled 2017-07-26 (×4): qty 1

## 2017-07-26 MED ORDER — AMIODARONE HCL 200 MG PO TABS
200.0000 mg | ORAL_TABLET | Freq: Every day | ORAL | Status: DC
Start: 2017-07-26 — End: 2017-07-30
  Administered 2017-07-26 – 2017-07-30 (×5): 200 mg via ORAL
  Filled 2017-07-26 (×5): qty 1

## 2017-07-26 NOTE — Progress Notes (Signed)
[facility] NAME: Kristin Avila    MR#:  [id number]  DATE OF BIRTH:  05-05-1928  SUBJECTIVE:  CHIEF COMPLAINT:   Chief Complaint  Patient presents with  . Weakness   Generalized weakness. REVIEW OF SYSTEMS:  Review of Systems  Constitutional: Positive for malaise/fatigue. Negative for chills and fever.  HENT: Negative for nosebleeds.   Eyes: Negative for blurred vision and double vision.  Respiratory: Negative for cough, hemoptysis, sputum production, shortness of breath and wheezing.   Cardiovascular: Negative for chest pain, palpitations, orthopnea and leg swelling.  Gastrointestinal: Negative for abdominal pain, blood in stool, constipation, diarrhea, melena, nausea and vomiting.  Genitourinary: Negative for dysuria, flank pain, frequency, hematuria and urgency.  Musculoskeletal: Negative for falls and joint pain.  Skin: Negative for rash.  Neurological: Positive for weakness. Negative for dizziness, focal weakness, seizures, loss of consciousness and headaches.  Endo/Heme/Allergies: Negative for polydipsia. Does not bruise/bleed easily.  Psychiatric/Behavioral: Negative for depression.    DRUG ALLERGIES:  No Known Allergies VITALS:  Blood pressure 112/66, pulse 70, temperature 97.6 F (36.4 C), temperature source Oral, resp. rate 16, height 5\' 2"  (1.575 m), weight 174 lb (78.9 kg), SpO2 97 %. PHYSICAL EXAMINATION:  Physical Exam  Constitutional: She is oriented to person, place, and time and well-developed, well-nourished, and in no distress.  HENT:  Head: Normocephalic.  Mouth/Throat: Oropharynx is clear and moist.  Eyes: Conjunctivae and EOM are normal. Pupils are equal, round, and reactive to light. No scleral icterus.  Neck: Normal range of motion. Neck supple. No JVD present. No tracheal deviation present.  Cardiovascular: Normal rate, regular rhythm and normal heart sounds. Exam reveals no gallop.  No  murmur heard. Pulmonary/Chest: Effort normal and breath sounds normal. No respiratory distress. She has no wheezes. She has no rales.  Abdominal: Soft. Bowel sounds are normal. She exhibits no distension. There is no tenderness. There is no rebound.  Musculoskeletal: Normal range of motion. She exhibits no edema or tenderness.  Neurological: She is alert and oriented to person, place, and time. No cranial nerve deficit.  Skin: No rash noted. No erythema.  Psychiatric: Affect normal.   LABORATORY PANEL:  Female CBC Recent Labs  Lab 07/26/17 0506  WBC 7.2  HGB 12.7  HCT 40.1  PLT 167   ------------------------------------------------------------------------------------------------------------------ Chemistries  Recent Labs  Lab 07/25/17 1724 07/26/17 0506  NA 137 136  K 4.2 4.0  CL 101 103  CO2 23 21*  GLUCOSE 86 75  BUN 24* 25*  CREATININE 1.45* 1.32*  CALCIUM 9.2 8.9  MG 1.8  --   AST 113*  115* 123*  ALT 92*  93* 96*  ALKPHOS 134*  137* 139*  BILITOT 2.5*  2.2* 2.4*   RADIOLOGY:  Ct Chest Wo Contrast  Result Date: 07/25/2017 CLINICAL DATA:  83 year old female with sick sinus syndrome with pacemaker placement, hypertension and diabetes admitted for pleural effusions and weakness. EXAM: CT CHEST WITHOUT CONTRAST TECHNIQUE: Multidetector CT imaging of the chest was performed following the standard protocol without IV contrast. COMPARISON:  07/25/2017 CXR, 05/20/2017 CT FINDINGS: Cardiovascular: Stable cardiomegaly with left anterior chest wall pacemaker apparatus with right atrial and right ventricular leads. No significant pericardial effusion or thickening. Mild-to-moderate aortic atherosclerosis without aneurysm. The unenhanced main pulmonary artery is mildly dilated to 2.9 cm consistent with chronic pulmonary hypertension. Mediastinum/Nodes: Small prevascular and paratracheal lymph nodes without pathologic enlargement. The study is limited in the assessment for hilar  adenopathy due to lack of IV contrast. Lungs/Pleura: Stable chronic left pleural effusion with slight interval increase in loculated right pleural effusion extending along the lateral aspect of the right lung base and into the major fissure. The lungs are otherwise stable in appearance without active pulmonary disease. Some passive atelectasis at the left lung base with compressive atelectasis bilaterally. Upper Abdomen: No acute abnormality. Hyperdense noncontrast appearance of the liver can be seen with amiodarone use which the patient is apparently on per her medication list. Punctate calcification in the upper pole the right kidney is suggested, partially imaged. Musculoskeletal: No chest wall mass or suspicious bone lesions identified thoracic spondylosis. No acute nor suspicious osseous lesions. Osteoarthritis of the glenohumeral joints bilaterally. IMPRESSION: 1. Slight interval increase in loculated right pleural effusion stable small left pleural effusion. No active pulmonary disease. 2. Stable cardiomegaly with ICD device in place. 3. Aortic atherosclerosis without aneurysm. 4. Mild dilatation of the main pulmonary artery to 2.9 cm consistent with a component of chronic pulmonary arterial hypertension. 5. Hyperdense appearance the liver consistent with amiodarone use. 6. Calculus in the upper pole of the included right kidney. No obstructive uropathy. 7. Thoracic spondylosis and bilateral glenohumeral joint osteoarthritis. Aortic Atherosclerosis (ICD10-I70.0). Electronically Signed   By: Ashley Royalty M.D.   On: 07/25/2017 20:27   Dg Chest Portable 1 View  Result Date: 07/25/2017 CLINICAL DATA:  Weakness EXAM: PORTABLE CHEST 1 VIEW COMPARISON:  05/19/2017 FINDINGS: Cardiomegaly. Loculated right pleural effusion and small layering left pleural effusion noted. Bilateral lower lobe airspace opacities. Mild vascular congestion. No acute bony abnormality. Left pacer is unchanged. IMPRESSION: Moderate  loculated right pleural effusion has increased since prior study. Small layering left pleural effusion. Bilateral lower lobe atelectasis or infiltrates. Cardiomegaly, vascular congestion. Electronically Signed   By: Rolm Baptise M.D.   On: 07/25/2017 16:08   ASSESSMENT AND PLAN:    AKI (acute kidney injury) due to dehydration. Improving.  Pleural effusion on right Continue Lasix, hold if blood pressure is low.  Community acquired pneumonia Continue Zithromax and Rocephin.   Acute respiratory failure with hypoxia due to acute on chronic diastolic CHF and pneumonia.  Improved, off oxygen by nasal cannula.  Acute on chronic diastolic CHF.   Continue Lasix iv bid. History of A. fib, status post pacemaker placement. Continue Eliquis. Decrease amiodarone to 200 mg once daily due to elevated liver enzymes per cardiology consult.  Diabetes.  Sliding scale. Generalized weakness.  PT evaluation suggest skilled nursing facility placement.    All the records are reviewed and case discussed with Care Management/Social Worker. Management plans discussed with the patient, her son and they are in agreement.  CODE STATUS: Full Code  TOTAL TIME TAKING CARE OF THIS PATIENT: 36 minutes.   More than 50% of the time was spent in counseling/coordination of care: YES  POSSIBLE D/C IN 2 DAYS, DEPENDING ON CLINICAL CONDITION.   Demetrios Loll M.D on 07/26/2017 at 3:24 PM  Between 7am to 6pm - Pager - [phone]  After 6pm go to [email or web address] - [facility]

## 2017-07-26 NOTE — Evaluation (Signed)
Physical Therapy Evaluation Patient Details Name: Kristin Avila MRN: [id number] DOB: [birth year] Today's Date: 07/26/2017   History of Present Illness  Pt is an 83 y.o. female presenting to hospital with increasing weakness; recent hospital admission with electrolyte abnormalities (low potassium and magnesium).  Pt admitted with acute respiratory distress secondary to acute on chronic diastolic CHF and PNA, AKI, R pleural effusion, community acquired PNA, and a-fib.  PMH includes SSS with pacemaker, anemia, a-fib, htn, R thoracotomy, and DM.  Clinical Impression  Prior to recent hospital admissions, pt was ambulating with RW but most recently pt requiring assist to transfer to w/c.  Pt lives with family in 1 level home with step to enter.  Currently pt is mod assist supine to sit; mod assist to stand with RW (posterior lean noted with standing requiring assist to correct); and CGA to min assist x2 to ambulate a few feet with RW bed to recliner.  Limited mobility d/t generalized weakness, fatigue, and increasing SOB with limited activity.  Pt would benefit from skilled PT to address noted impairments and functional limitations (see below for any additional details).  Upon hospital discharge, recommend pt discharge to [facility].    Follow Up Recommendations SNF    Equipment Recommendations  Rolling walker with 5" wheels    Recommendations for Other Services       Precautions / Restrictions Precautions Precautions: Fall Restrictions Weight Bearing Restrictions: No      Mobility  Bed Mobility Overal bed mobility: Needs Assistance Bed Mobility: Supine to Sit     Supine to sit: Mod assist;HOB elevated     General bed mobility comments: assist for trunk and scooting to edge of bed; increased effort and time to perform (increased SOB noted)  Transfers Overall transfer level: Needs assistance Equipment used: Rolling walker (2 wheeled) Transfers: Sit to/from Stand Sit to Stand: Mod  assist         General transfer comment: assist to initiate and come to full stand with RW; posterior lean noted requiring assist to shift weight forward; vc's required for UE and LE positioning and shifting weight forward with standing x3 trials  Ambulation/Gait Ambulation/Gait assistance: Min guard;Min assist;+2 physical assistance(anticipate able to do with 1 assist but improved confidence noted with 2 assist) Ambulation Distance (Feet): 3 Feet(bed to recliner) Assistive device: (youth sized RW)   Gait velocity: decreased   General Gait Details: decreased B step length/foot clearance/heelstrike; increased effort and time to take steps; limited distance d/t fatigue and SOB  Stairs            Wheelchair Mobility    Modified Rankin (Stroke Patients Only)       Balance Overall balance assessment: Needs assistance Sitting-balance support: No upper extremity supported;Feet supported Sitting balance-Leahy Scale: Good Sitting balance - Comments: steady sitting reaching within BOS   Standing balance support: Bilateral upper extremity supported Standing balance-Leahy Scale: Poor Standing balance comment: Requires B UE support to maintain static standing balance (pt initially with posterior lean requiring assist to shift weight forward)                             Pertinent Vitals/Pain Pain Assessment: No/denies pain  Vitals (HR and O2 on room air) stable and WFL throughout treatment session.    Home Living Family/patient expects to be discharged to:: Private residence Living Arrangements: Other relatives(Pt grandson, grandson's wife, and grandson's 2 children) Available Help at Discharge: Family;Available 24 hours/day Type  of Home: House Home Access: Stairs to enter Entrance Stairs-Rails: None Entrance Stairs-Number of Steps: 1 Home Layout: One level Home Equipment: Walker - 2 wheels;Wheelchair - manual;Shower seat      Prior Function            Comments: Pt was ambulating with RW but most recently required assist to get up to w/c d/t increasing weakness and SOB.  Pt reports 1 falls about 6 months ago.  Also reports recent difficulty swallowing (nursing aware and reporting plan for SLP consult).     Hand Dominance        Extremity/Trunk Assessment   Upper Extremity Assessment Upper Extremity Assessment: Generalized weakness    Lower Extremity Assessment Lower Extremity Assessment: Generalized weakness       Communication   Communication: HOH  Cognition Arousal/Alertness: Awake/alert Behavior During Therapy: WFL for tasks assessed/performed Overall Cognitive Status: Within Functional Limits for tasks assessed                                        General Comments General comments (skin integrity, edema, etc.): B LE edema noted.  Nursing cleared pt for participation in physical therapy.  Pt agreeable to PT session.  Pt's son and pt's friend present during session.    Exercises  Transfer training.   Assessment/Plan    PT Assessment Patient needs continued PT services  PT Problem List Decreased strength;Decreased activity tolerance;Decreased balance;Decreased mobility       PT Treatment Interventions DME instruction;Gait training;Stair training;Functional mobility training;Therapeutic activities;Therapeutic exercise;Balance training;Patient/family education    PT Goals (Current goals can be found in the Care Plan section)  Acute Rehab PT Goals Patient Stated Goal: to get stronger PT Goal Formulation: With patient/family Time For Goal Achievement: 08/09/17 Potential to Achieve Goals: Good    Frequency Min 2X/week   Barriers to discharge Decreased caregiver support      Co-evaluation               AM-PAC PT "6 Clicks" Daily Activity  Outcome Measure Difficulty turning over in bed (including adjusting bedclothes, sheets and blankets)?: Unable Difficulty moving from lying on back  to sitting on the side of the bed? : Unable Difficulty sitting down on and standing up from a chair with arms (e.g., wheelchair, bedside commode, etc,.)?: Unable Help needed moving to and from a bed to chair (including a wheelchair)?: A Lot Help needed walking in hospital room?: Total Help needed climbing 3-5 steps with a railing? : Total 6 Click Score: 7    End of Session Equipment Utilized During Treatment: Gait belt Activity Tolerance: Patient limited by fatigue Patient left: in chair;with call bell/phone within reach;with chair alarm set;with family/visitor present Nurse Communication: Mobility status;Precautions PT Visit Diagnosis: Other abnormalities of gait and mobility (R26.89);Muscle weakness (generalized) (M62.81);History of falling (Z91.81)    Time: 1130-1203 PT Time Calculation (min) (ACUTE ONLY): 33 min   Charges:   PT Evaluation $PT Eval Low Complexity: 1 Low PT Treatments $Therapeutic Activity: 8-22 mins   PT G Codes:   PT G-Codes **NOT FOR INPATIENT CLASS** Functional Assessment Tool Used: AM-PAC 6 Clicks Basic Mobility Functional Limitation: Mobility: Walking and moving around Mobility: Walking and Moving Around Current Status (N3976): At least 80 percent but less than 100 percent impaired, limited or restricted Mobility: Walking and Moving Around Goal Status [phone]): At least 20 percent but less than 40 percent impaired,  limited or restricted    Leitha Bleak, PT 07/26/17, 12:20 PM [phone]

## 2017-07-26 NOTE — Plan of Care (Signed)
  Progressing Pain Managment: General experience of comfort will improve 07/26/2017 1119 - Progressing by Etheleen Nicks, RN Note PRN medications Safety: Ability to remain free from injury will improve 07/26/2017 1119 - Progressing by Etheleen Nicks, RN Note Fall precautions in place, non skid socks when oob   Not Progressing Clinical Measurements: Diagnostic test results will improve 07/26/2017 1119 - Not Progressing by Etheleen Nicks, RN Note BNP 1229 Cardiac: Ability to achieve and maintain adequate cardiopulmonary perfusion will improve 07/26/2017 1119 - Not Progressing by Etheleen Nicks, RN Note Remains on IV Lasix, daily weights, intake and output   Not Applicable Clinical Measurements: Will remain free from infection 15/83/0940 7680 - Not Applicable by Etheleen Nicks, RN

## 2017-07-26 NOTE — Progress Notes (Signed)
Chaplain responded to an OR for prayer. [place] visit pt, met pt and son at bedside. Son talked about pt's health challenges and his health hallenges as well. Pt said he was feeling much better today then he did yesterday. Pt was calm and sleepy. Per pt's request, [place] offered prayer and the ministry of presence.    07/26/17 1000  Clinical Encounter Type  Visited With Patient;Patient and family together  Visit Type Initial;Spiritual support  Referral From Nurse  Consult/Referral To Chaplain  Spiritual Encounters  Spiritual Needs Prayer

## 2017-07-26 NOTE — Progress Notes (Signed)
BP=98/57 rechecked was 101/53 Dr. Marcille Blanco notified and was ordered to hold her Lasix 40 mg IV scheduled at 0600. Will continue to monitor.

## 2017-07-26 NOTE — Care Management Note (Addendum)
Case Management Note  Patient Details  Name: Kristin Avila MRN: [id number] Date of Birth: Oct 24, 1927  Subjective/Objective:                  Met with patient and her son to discuss transition of care. She states that she lives with her nephew. Her son expressed concern that her mobilization has declined. He states that she is dependent on walker. He also states that his sister (patient's daughter) manages her health care. She states her PCP is Dr. Nicki Reaper. She denies problems obtaining medications.  Action/Plan:   Home health list provided. PT pending.   Expected Discharge Date:  07/27/17               Expected Discharge Plan:     In-House Referral:     Discharge planning Services  CM Consult  Post Acute Care Choice:  Home Health Choice offered to:  Patient, Adult Children  DME Arranged:    DME Agency:     HH Arranged:    [place] Agency:     Status of Service:  In process, will continue to follow  If discussed at Long Length of Stay Meetings, dates discussed:    Additional Comments:  Marshell Garfinkel, RN 07/26/2017, 11:28 AM

## 2017-07-26 NOTE — Progress Notes (Addendum)
Initial Nutrition Assessment  DOCUMENTATION CODES:   Non-severe (moderate) malnutrition in context of chronic illness  INTERVENTION:   Recommend check Mg and P labs   Ensure Enlive po BID, each supplement provides 350 kcal and 20 grams of protein  Dysphagia 3 diet   Recommend SLP evaluation  NUTRITION DIAGNOSIS:   Moderate Malnutrition related to catabolic illness(CHF, advanced age) as evidenced by mild fat depletion, moderate to severe muscle depletions.  GOAL:   Patient will meet greater than or equal to 90% of their needs  MONITOR:   PO intake, Supplement acceptance, Labs, Weight trends, I & O's  REASON FOR ASSESSMENT:   Malnutrition Screening Tool    ASSESSMENT:   83 y.o. female presenting to hospital with increasing weakness; recent hospital admission with electrolyte abnormalities (low potassium and magnesium).  Pt admitted with acute respiratory distress secondary to acute on chronic diastolic CHF and PNA, AKI, R pleural effusion, community acquired PNA, and a-fib.  PMH includes SSS with pacemaker, anemia, a-fib, htn, R thoracotomy, and DM.   Met with pt and pt's son in room today. Pt is a poor historian so history obtained from pt's son who reports pt with chronic poor appetite and oral intake. Per son, pt will eat small meals. Pt does not drink supplements at home. Per son, pt also with difficulty swallowing and needs soft foods; recommend SLP evaluation. Per chart, pt is weight stable. RD will order Ensure and dysphagia 3 diet. Pt at refeeding risk; recommend check Mg and P labs.    Medications reviewed and include: azithromycin, vitamin D, colace, ferrous sulfate, lasix, insulin, Mg oxide, protonix, KCl  Labs reviewed: BUN 25(H), creat 1.32(H), alb 2.9(L), Alk Phos 139(H), AST 123(H), tbili 2.4(H) Mg 1.8 wnl- 12/16  Nutrition-Focused physical exam completed. Findings are mild fat depletions, moderate to severe muscle depletions, and mild edema in BLE.    Diet  Order:  DIET DYS 3 Room service appropriate? Yes; Fluid consistency: Thin  EDUCATION NEEDS:   No education needs have been identified at this time  Skin:  Reviewed RN Assessment  Last BM:  12/17- type 6  Height:   Ht Readings from Last 1 Encounters:  07/25/17 5' 2"  (1.575 m)    Weight:   Wt Readings from Last 1 Encounters:  07/26/17 174 lb (78.9 kg)    Ideal Body Weight:  50 kg  BMI:  Body mass index is 31.83 kg/m.  Estimated Nutritional Needs:   Kcal:  1400-1700kcal/day   Protein:  63-87g/day   Fluid:  >1.4L/day   Koleen Distance MS, RD, LDN Pager #[phone] After Hours Pager: [phone]

## 2017-07-26 NOTE — Progress Notes (Signed)
CBG on admission was 69 mg/dL. patient was given shasta coke per her preference.  Rechecked CBG was 90 mg/dL. No acute distress noted.

## 2017-07-26 NOTE — Consult Note (Signed)
[facility] Cardiology  CARDIOLOGY CONSULT NOTE  Patient ID: Kristin Avila MRN: [id number] DOB/AGE: 1928/04/22 83 y.o.  Admit date: 07/25/2017 Referring Physician [place]ks Primary Physician Einar Pheasant, MD  Primary Cardiologist Nehemiah Massed Reason for Consultation CHF  HPI: 83 year old female referred for evaluation of acute on chronic diastolic heart failure. The patient has a history of essential hypertension, atrial fibrillation on Eliquis and amiodarone, recent history of left upper extremity DVT, sick sinus syndrome, status post pacemaker, hypercholesterolemia, and iron deficiency anemia. The patient was recently managed in the ER on 07/20/17 for hypokalemia. The patient presented to [facility] ER yesterday for progressive weakness, shortness of breath, and no longer able to get around at home over the last several days. She had not been having chest pain, palpitations, or peripheral edema. Patient was noted to be hypoxic in the ER. Admission labs notable for a BUN 24, creatinine 1.45, BNP 1,229, AST 115, ALT 93, troponin 0.03. Chest CT revealed stable cardiomegaly, stable chronic left pleural effusion with slight interval increase in loculated right pleural effusion, and hyperdense appearance of the liver consistent with amiodarone use. ECG revealed paced rhythm at a rate of 100 bpm. She was started on IV Lasix and empiric antibiotics. 2D echocardiogram 05/21/17 revealed normal left ventricular function with LVEF 50-55%, mild aortic stenosis, mild mitral regurgitation, moderate tricuspid regurgitation. Currently, the patient reports feeling fine. She denies chest pain, shortness of breath, or palpitations.   Review of systems complete and found to be negative unless listed above     Past Medical History:  Diagnosis Date  . Anemia   . Anxiety 10/02/2012  . Atrial fibrillation ([place]) 11/13   new onset 11/13  . BCC (basal cell carcinoma), face 04/24/2016  . Benign essential hypertension 03/13/2015  .  Cellulitis of right upper extremity 01/27/2017  . Constipation 01/13/2015  . Decreased hearing 05/21/2015  . Health care maintenance 10/14/2014   Mammogram 10/01/15 - Birads I.    . Hypercholesterolemia   . Hyperlipidemia, mixed 02/01/2017  . Hypertension   . Osteoarthritis    hands, feet  . Sick sinus syndrome ([place]) 01/04/2017   Overview:  Sp pacer placement 2018  . Syncope 12/08/2016    Past Surgical History:  Procedure Laterality Date  . APPENDECTOMY  1957  . CATARACT EXTRACTION  2013   left eye  . COLONOSCOPY  09/06/2008  . COLONOSCOPY WITH PROPOFOL N/A 05/23/2017   Procedure: COLONOSCOPY WITH PROPOFOL;  Surgeon: Lucilla Lame, MD;  Location: [facility] ENDOSCOPY;  Service: Endoscopy;  Laterality: N/A;  . ESOPHAGOGASTRODUODENOSCOPY (EGD) WITH PROPOFOL N/A 05/23/2017   Procedure: ESOPHAGOGASTRODUODENOSCOPY (EGD) WITH PROPOFOL;  Surgeon: Lucilla Lame, MD;  Location: [facility] ENDOSCOPY;  Service: Endoscopy;  Laterality: N/A;  . INCISION AND DRAINAGE ABSCESS Right 01/29/2017   Procedure: INCISION AND DRAINAGE ABSCESS;  Surgeon: Florene Glen, MD;  Location: [facility] ORS;  Service: General;  Laterality: Right;  . KIDNEY SURGERY  age 43  . PACEMAKER INSERTION Left 12/14/2016   Procedure: INSERTION PACEMAKER;  Surgeon: Isaias Cowman, MD;  Location: [facility] ORS;  Service: Cardiovascular;  Laterality: Left;  . TUBAL LIGATION    . VIDEO ASSISTED THORACOSCOPY (VATS)/THOROCOTOMY Right 04/19/2017   Procedure: RIGHT THORACOSCOPY AND EVACUATION OF HEMOTHORAX;  Surgeon: Nestor Lewandowsky, MD;  Location: [facility] ORS;  Service: General;  Laterality: Right;    Medications Prior to Admission  Medication Sig Dispense Refill Last Dose  . acetaminophen (TYLENOL) 325 MG tablet Take 2 tablets (650 mg total) by mouth every 6 (six) hours as needed for mild  pain, moderate pain or fever (or Fever >/= 101).   PRN at PRN  . amiodarone (PACERONE) 200 MG tablet TAKE 1 TABLET BY MOUTH TWICE A DAY 60 tablet 2 07/25/2017 at 1100  .  apixaban (ELIQUIS) 5 MG TABS tablet Take 1 tablet (5 mg total) by mouth 2 (two) times daily. 60 tablet 0 07/25/2017 at 1100  . bisacodyl (DULCOLAX) 5 MG EC tablet Take 1 tablet (5 mg total) by mouth daily as needed for moderate constipation. 30 tablet 0 PRN at PRN  . Cholecalciferol (VITAMIN D-3) 1000 UNITS CAPS Take 1 capsule by mouth daily.    07/25/2017 at 1100  . ferrous sulfate 325 (65 FE) MG tablet Take 1 tablet (325 mg total) by mouth 2 (two) times daily with a meal. 60 tablet 3 07/25/2017 at 1100  . FLUoxetine (PROZAC) 40 MG capsule Take 1 capsule (40 mg total) by mouth daily. 90 capsule 1 07/25/2017 at 1100  . lovastatin (MEVACOR) 20 MG tablet Take 1 tablet (20 mg total) by mouth daily. 90 tablet 1 07/25/2017 at 1100  . metoprolol succinate (TOPROL-XL) 50 MG 24 hr tablet TAKE (1) TABLET BY MOUTH DAILY. TAKE WITH OR IMMEDIATELY FOLLOWING A MEAL 30 tablet 10 07/24/2017 at 1400  . potassium chloride SA (KLOR-CON M20) 20 MEQ tablet Take 1 tablet (20 mEq total) by mouth 2 (two) times daily for 14 days. 28 tablet 0 07/23/2017 at UNKNOWN  . senna-docusate (SENOKOT-S) 8.6-50 MG tablet Take 1 tablet by mouth at bedtime as needed for mild constipation.   PRN at PRN  . torsemide (DEMADEX) 5 MG tablet TAKE 1 TABLET BY MOUTH EVERY DAY 30 tablet 2 07/25/2017 at 1100  . traZODone (DESYREL) 100 MG tablet TAKE 1 TABLET (100 MG TOTAL) BY MOUTH AT BEDTIME AS NEEDED FOR SLEEP. 15 tablet 1 07/24/2017 at 2130  . albuterol (PROVENTIL) (2.5 MG/3ML) 0.083% nebulizer solution Take 3 mLs (2.5 mg total) by nebulization every 2 (two) hours as needed for wheezing. (Patient not taking: Reported on 07/25/2017) 75 mL 12 Not Taking at Unknown time  . HYDROcodone-acetaminophen (NORCO/VICODIN) 5-325 MG tablet Take 1-2 tablets by mouth every 6 (six) hours as needed for moderate pain. (Patient not taking: Reported on 07/25/2017) 120 tablet 0 Completed Course at Unknown time   Social History   Socioeconomic History  . Marital  status: Widowed    Spouse name: Not on file  . Number of children: 4  . Years of education: Not on file  . Highest education level: Not on file  Social Needs  . Financial resource strain: Not on file  . Food insecurity - worry: Not on file  . Food insecurity - inability: Not on file  . Transportation needs - medical: Not on file  . Transportation needs - non-medical: Not on file  Occupational History  . Not on file  Tobacco Use  . Smoking status: Never Smoker  . Smokeless tobacco: Never Used  Substance and Sexual Activity  . Alcohol use: No    Alcohol/week: 0.0 oz    Comment: occasional  . Drug use: No  . Sexual activity: No  Other Topics Concern  . Not on file  Social History Narrative   Widowed   4 children   Never smoker   Occasionally drinks   Full Code    Family History  Problem Relation Age of Onset  . Congestive Heart Failure Father   . Multiple myeloma Mother   . Hypertension Brother   . Alzheimer's disease Unknown  grandmother and great aunts  . Breast cancer Neg Hx   . Colon cancer Neg Hx       Review of systems complete and found to be negative unless listed above      PHYSICAL EXAM  General: Well developed, well nourished, in no acute distress, lying supine in bed on room air. HEENT:  Normocephalic and atramatic Neck:  No JVD.  Lungs: Normal effort of breathing on room air. Decreased breath sounds and crackles right lower lobe, faint crackles left lower lobe. No wheezing.  Heart: HRRR . Normal S1 and S2 without gallops or murmurs.  Abdomen: Bowel sounds are positive, abdomen soft Msk:  Decreased strength and tone throughout, requiring assistance to sit up in bed. Extremities: Trace bilateral lower extremity edema.   Neuro: Alert and oriented X 3. Psych:  Good affect, responds appropriately  Labs:   Lab Results  Component Value Date   WBC 7.2 07/26/2017   HGB 12.7 07/26/2017   HCT 40.1 07/26/2017   MCV 90.2 07/26/2017   PLT 167  07/26/2017    Recent Labs  Lab 07/26/17 0506  NA 136  K 4.0  CL 103  CO2 21*  BUN 25*  CREATININE 1.32*  CALCIUM 8.9  PROT 5.4*  BILITOT 2.4*  ALKPHOS 139*  ALT 96*  AST 123*  GLUCOSE 75   Lab Results  Component Value Date   CKTOTAL 83 08/21/2012   CKMB 0.8 08/21/2012   TROPONINI 0.03 (HH) 07/26/2017    Lab Results  Component Value Date   CHOL 219 (H) 11/26/2016   CHOL 198 05/08/2016   CHOL 202 (H) 09/24/2015   Lab Results  Component Value Date   HDL 91.90 11/26/2016   HDL 80.40 05/08/2016   HDL 77.60 09/24/2015   Lab Results  Component Value Date   LDLCALC 113 (H) 11/26/2016   LDLCALC 106 (H) 05/08/2016   LDLCALC 112 (H) 09/24/2015   Lab Results  Component Value Date   TRIG 68.0 11/26/2016   TRIG 57.0 05/08/2016   TRIG 61.0 09/24/2015   Lab Results  Component Value Date   CHOLHDL 2 11/26/2016   CHOLHDL 2 05/08/2016   CHOLHDL 3 09/24/2015   Lab Results  Component Value Date   LDLDIRECT 105.8 09/06/2013      Radiology: Ct Chest Wo Contrast  Result Date: 07/25/2017 CLINICAL DATA:  83 year old female with sick sinus syndrome with pacemaker placement, hypertension and diabetes admitted for pleural effusions and weakness. EXAM: CT CHEST WITHOUT CONTRAST TECHNIQUE: Multidetector CT imaging of the chest was performed following the standard protocol without IV contrast. COMPARISON:  07/25/2017 CXR, 05/20/2017 CT FINDINGS: Cardiovascular: Stable cardiomegaly with left anterior chest wall pacemaker apparatus with right atrial and right ventricular leads. No significant pericardial effusion or thickening. Mild-to-moderate aortic atherosclerosis without aneurysm. The unenhanced main pulmonary artery is mildly dilated to 2.9 cm consistent with chronic pulmonary hypertension. Mediastinum/Nodes: Small prevascular and paratracheal lymph nodes without pathologic enlargement. The study is limited in the assessment for hilar adenopathy due to lack of IV contrast.  Lungs/Pleura: Stable chronic left pleural effusion with slight interval increase in loculated right pleural effusion extending along the lateral aspect of the right lung base and into the major fissure. The lungs are otherwise stable in appearance without active pulmonary disease. Some passive atelectasis at the left lung base with compressive atelectasis bilaterally. Upper Abdomen: No acute abnormality. Hyperdense noncontrast appearance of the liver can be seen with amiodarone use which the patient is apparently on per her  medication list. Punctate calcification in the upper pole the right kidney is suggested, partially imaged. Musculoskeletal: No chest wall mass or suspicious bone lesions identified thoracic spondylosis. No acute nor suspicious osseous lesions. Osteoarthritis of the glenohumeral joints bilaterally. IMPRESSION: 1. Slight interval increase in loculated right pleural effusion stable small left pleural effusion. No active pulmonary disease. 2. Stable cardiomegaly with ICD device in place. 3. Aortic atherosclerosis without aneurysm. 4. Mild dilatation of the main pulmonary artery to 2.9 cm consistent with a component of chronic pulmonary arterial hypertension. 5. Hyperdense appearance the liver consistent with amiodarone use. 6. Calculus in the upper pole of the included right kidney. No obstructive uropathy. 7. Thoracic spondylosis and bilateral glenohumeral joint osteoarthritis. Aortic Atherosclerosis (ICD10-I70.0). Electronically Signed   By: Ashley Royalty M.D.   On: 07/25/2017 20:27   Dg Chest Portable 1 View  Result Date: 07/25/2017 CLINICAL DATA:  Weakness EXAM: PORTABLE CHEST 1 VIEW COMPARISON:  05/19/2017 FINDINGS: Cardiomegaly. Loculated right pleural effusion and small layering left pleural effusion noted. Bilateral lower lobe airspace opacities. Mild vascular congestion. No acute bony abnormality. Left pacer is unchanged. IMPRESSION: Moderate loculated right pleural effusion has increased  since prior study. Small layering left pleural effusion. Bilateral lower lobe atelectasis or infiltrates. Cardiomegaly, vascular congestion. Electronically Signed   By: Rolm Baptise M.D.   On: 07/25/2017 16:08    EKG: Paced rhythm, rate 70 bpm  ASSESSMENT AND PLAN:  1. Acute respiratory distress secondary to acute on chronic diastolic CHF and pneumonia 2. Acute kidney injury, BUN 25, creatinine 1.32 3. Loculated right pleural effusion 4. Community acquired pneumonia 5. Atrial fibrillation on amiodarone and metoprolol for rate and rhythm control and Eliquis for stroke prevention. Rate is 70 bpm. Liver enzymes elevated, AST 123, ALT 96. TSH is 3.974.   Recommendations: 1. Agree with overall therapy 2. Continue gentle diuresis with careful monitoring of renal status and electrolytes. 3. Continue Eliquis for stroke prevention and as continued management of recent DVT. 4. Review 2D echocardiogram 5. Decrease amiodarone to 200 mg once daily due to elevated liver enzymes.  Signed: Clabe Seal PA-C 07/26/2017, 8:08 AM

## 2017-07-27 ENCOUNTER — Other Ambulatory Visit: Payer: Medicare Other

## 2017-07-27 ENCOUNTER — Ambulatory Visit: Payer: Medicare Other | Admitting: Internal Medicine

## 2017-07-27 LAB — GLUCOSE, CAPILLARY
GLUCOSE-CAPILLARY: 97 mg/dL (ref 65–99)
GLUCOSE-CAPILLARY: 88 mg/dL (ref 65–99)
GLUCOSE-CAPILLARY: 99 mg/dL (ref 65–99)
GLUCOSE-CAPILLARY: 144 mg/dL — AB (ref 65–99)
Glucose-Capillary: 111 mg/dL — ABNORMAL HIGH (ref 65–99)

## 2017-07-27 LAB — BASIC METABOLIC PANEL
ANION GAP: 11 (ref 5–15)
BUN: 24 mg/dL — ABNORMAL HIGH (ref 6–20)
CALCIUM: 9.4 mg/dL (ref 8.9–10.3)
CO2: 22 mmol/L (ref 22–32)
Chloride: 103 mmol/L (ref 101–111)
Creatinine, Ser: 1.5 mg/dL — ABNORMAL HIGH (ref 0.44–1.00)
GFR, EST AFRICAN AMERICAN: 35 mL/min — AB (ref >60)
GFR, EST NON AFRICAN AMERICAN: 30 mL/min — AB (ref >60)
GLUCOSE: 89 mg/dL (ref 65–99)
POTASSIUM: 4.3 mmol/L (ref 3.5–5.1)
SODIUM: 136 mmol/L (ref 135–145)

## 2017-07-27 LAB — MAGNESIUM: MAGNESIUM: 1.9 mg/dL (ref 1.7–2.4)

## 2017-07-27 LAB — PHOSPHORUS: Phosphorus: 1.8 mg/dL — ABNORMAL LOW (ref 2.5–4.6)

## 2017-07-27 MED ORDER — ALPRAZOLAM 0.25 MG PO TABS
0.1250 mg | ORAL_TABLET | Freq: Once | ORAL | Status: AC | PRN
  Administered 2017-07-27: 0.125 mg via ORAL
  Filled 2017-07-27: qty 1

## 2017-07-27 MED ORDER — K PHOS MONO-SOD PHOS DI & MONO 155-852-130 MG PO TABS
500.0000 mg | ORAL_TABLET | ORAL | Status: AC
  Administered 2017-07-27 (×2): 500 mg via ORAL
  Filled 2017-07-27 (×4): qty 2

## 2017-07-27 MED ORDER — POTASSIUM CHLORIDE CRYS ER 20 MEQ PO TBCR
20.0000 meq | EXTENDED_RELEASE_TABLET | Freq: Every day | ORAL | Status: DC
  Administered 2017-07-27 – 2017-07-30 (×4): 20 meq via ORAL
  Filled 2017-07-27 (×3): qty 1

## 2017-07-27 NOTE — Clinical Social Work Note (Addendum)
Clinical Social Work Assessment  Patient Details  Name: Kristin Avila MRN: [id number] Date of Birth: January 27, 1928  Date of referral:  07/27/17               Reason for consult:  Facility Placement                Permission sought to share information with:  Family Supports, Customer service manager Permission granted to share information::  Yes, Verbal Permission Granted  Name::     Denicola,Carol Daughter [phone] or Quinlee, Sciarra [phone] or Rahel, Carlton [phone]   Agency::  SNF admissions  Relationship::     Contact Information:     Housing/Transportation Living arrangements for the past 2 months:  Single Family Home Source of Information:  Adult Children Patient Interpreter Needed:  None Criminal Activity/Legal Involvement Pertinent to Current Situation/Hospitalization:  No - Comment as needed Significant Relationships:  Adult Children Lives with:  Self Do you feel safe going back to the place where you live?  No Need for family participation in patient care:  Yes (Comment)  Care giving concerns:  Patient's family feel she needs to go to rehab, however patient will have to pay a copay, and family don't feel like she can afford it.   Social Worker assessment / plan:  Patient is an 83 year old female who lives alone, however, patient's family live next door to he.  CSW spoke to patient's daughter, and CSW discussed PT recommendation for SNF placement.  CSW was informed that patient was recently at [facility] from October 19-November 10.  CSW explained to daughter that because patient has not had a 60 day wellness period of not being in the hospital or a skilled facility, patient will be in her copay days which would be 50 dollars a day if she went back to [place].  Patient's daughter was not happy about the information given she stated patient can not afford copays.  CSW explained to daughter the only other option is to go  home with home health.  CSW discussed with patient she was not happy about these options, CSW explained unfortunately this is how the insurance company works.  Patient's daughter did agree to have CSW begin bed search in [place].  Patient's daughter feels that patient is not able to go back home right now due to her weakness.   Employment status:  Retired Nurse, adult PT Recommendations:  [place] / Referral to community resources:  [place]  Patient/Family's Response to care:  Patient's family not very happy that she is in copay days to go to SNF.  Patient's family are not sure if they are willing to pay for stay at [facility] [place].  Patient/Family's Understanding of and Emotional Response to Diagnosis, Current Treatment, and Prognosis:  Patient is sleeping, however patient's family are not very happy that she is in copay days and will have to pay something for patient to go to SNF.  Patient's family does not feel like patient should be punished because she became sick, CSW explained this is how her insurance works.   Emotional Assessment Appearance:  Appears stated age Attitude/Demeanor/Rapport:    Affect (typically observed):  Calm, Stable Orientation:  Oriented to Self Alcohol / Substance use:  Not Applicable Psych involvement (Current and /or in the community):  No (Comment)  Discharge Needs  Concerns to be addressed:  Care Coordination, Lack of Support, Cognitive Concerns Readmission within the last 30  days:    Current discharge risk:  Lack of support system, Lives alone, Cognitively Impaired Barriers to Discharge:  Continued Medical Work up, [employer]   Kristin Avila 07/27/2017, 6:56 PM

## 2017-07-27 NOTE — Progress Notes (Addendum)
MEDICATION RELATED CONSULT NOTE - INITIAL   Pharmacy Consult for electrolyte management Indication: hypophosphatemia  No Known Allergies  Patient Measurements: Height: 5\' 2"  (157.5 cm) Weight: 170 lb 14.4 oz (77.5 kg) IBW/kg (Calculated) : 50.1 Adjusted Body Weight:   Vital Signs: Temp: 97.8 F (36.6 C) (12/18 0729) Temp Source: Oral (12/18 0729) BP: 101/56 (12/18 0729) Pulse Rate: 71 (12/18 0729) Intake/Output from previous day: 12/17 0701 - 12/18 0700 In: 250 [IV Piggyback:250] Out: 252 [Urine:251; Stool:1] Intake/Output from this shift: No intake/output data recorded.  Labs: Recent Labs    07/25/17 1456 07/25/17 1724 07/26/17 0506 07/27/17 0415  WBC 9.5  --  7.2  --   HGB 13.4  --  12.7  --   HCT 42.4  --  40.1  --   PLT 144*  --  167  --   CREATININE  --  1.45* 1.32*  --   MG  --  1.8  --  1.9  PHOS  --   --   --  1.8*  ALBUMIN  --  3.0*  3.1* 2.9*  --   PROT  --  5.7*  5.7* 5.4*  --   AST  --  113*  115* 123*  --   ALT  --  92*  93* 96*  --   ALKPHOS  --  134*  137* 139*  --   BILITOT  --  2.5*  2.2* 2.4*  --   BILIDIR  --  1.2*  --   --   IBILI  --  1.0*  --   --    Estimated Creatinine Clearance: 29 mL/min (A) (by C-G formula based on SCr of 1.32 mg/dL (H)).   Microbiology: No results found for this or any previous visit (from the past 720 hour(s)).  Medical History: Past Medical History:  Diagnosis Date  . Anemia   . Anxiety 10/02/2012  . Atrial fibrillation ([place]) 11/13   new onset 11/13  . BCC (basal cell carcinoma), face 04/24/2016  . Benign essential hypertension 03/13/2015  . Cellulitis of right upper extremity 01/27/2017  . Constipation 01/13/2015  . Decreased hearing 05/21/2015  . Health care maintenance 10/14/2014   Mammogram 10/01/15 - Birads I.    . Hypercholesterolemia   . Hyperlipidemia, mixed 02/01/2017  . Hypertension   . Osteoarthritis    hands, feet  . Sick sinus syndrome ([place]) 01/04/2017   Overview:  Sp pacer placement 2018   . Syncope 12/08/2016    Medications:  Infusions:    Assessment: 89 yof cc AKI with pleural effusion (right), CAP, and ARD. Cardiology following. On Zithromax/Rocephin. Hx chronic diastolic HF on loop diuretic. Electrolyte abnormality noted, pharmacy consulted to manage electrolyte replacement and monitoring.   12/18 K 4.3, Ca 9.4, Mg 1.9, Phos 1.8  Goal of Therapy:  K 3.5 to 5 Ca 8.9 to 10.3 Mg 1.7 to 2.4 Phos 2.5 to 4.6  Plan:  Replace phosphorus with K Phos Neutral 500 mg po Q4H x 4 doses (to supply 8.8 mEq K, 64 mmol phos, 104 mEq Na). Reduce Klor-Con 20 mEq po BID to once daily. Recheck all electrolytes tomorrow with AM labs.  Laural Benes, Pharm.D., BCPS Clinical Pharmacist 07/27/2017,8:59 AM

## 2017-07-27 NOTE — Progress Notes (Signed)
Advanced Care Plan.  Purpose of Encounter: CODE STATUS. Parties in Attendance: The patient, her daughter and me. Patient's Decisional Capacity: Not at this time due to sleepiness and drowsiness. Medical Story: Kristin Avila is a 83 y.o. female has a past medical history significant for SSS s/p pacer, HTN, DM who was recently admitted for pleural effusion and weakness with hyponatremia, hypokalemia, and hypomagnesemia.  She is being treated with acute on chronic diastolic CHF, acute renal failure, pleural effusion and pneumonia.  I discussed the patient's condition, prognosis and code status with her daughter, who is her POA.  Her daughter wants full code for her.  Plan:  Code Status: Full code. Time spent discussing advance care planning: 17 minutes.

## 2017-07-27 NOTE — Progress Notes (Signed)
[facility] [place] NAME: Kristin Avila    MR#:  [id number]  DATE OF BIRTH:  [birth year]  SUBJECTIVE:  CHIEF COMPLAINT:   Chief Complaint  Patient presents with  . Weakness   The patient is very sleepy and drowsy. REVIEW OF SYSTEMS:  Review of Systems  Constitutional: Positive for malaise/fatigue. Negative for chills and fever.  HENT: Negative for nosebleeds.   Eyes: Negative for blurred vision and double vision.  Respiratory: Negative for cough, hemoptysis, sputum production, shortness of breath and wheezing.   Cardiovascular: Negative for chest pain, palpitations, orthopnea and leg swelling.  Gastrointestinal: Negative for abdominal pain, blood in stool, constipation, diarrhea, melena, nausea and vomiting.  Genitourinary: Negative for dysuria, flank pain, frequency, hematuria and urgency.  Musculoskeletal: Negative for falls and joint pain.  Skin: Negative for rash.  Neurological: Positive for weakness. Negative for dizziness, focal weakness, seizures, loss of consciousness and headaches.  Endo/Heme/Allergies: Negative for polydipsia. Does not bruise/bleed easily.  Psychiatric/Behavioral: Negative for depression.    DRUG ALLERGIES:  No Known Allergies VITALS:  Blood pressure (!) 97/58, pulse 70, temperature 97.8 F (36.6 C), temperature source Oral, resp. rate 20, height 5\' 2"  (1.575 m), weight 170 lb 14.4 oz (77.5 kg), SpO2 91 %. PHYSICAL EXAMINATION:  Physical Exam  Constitutional: She is oriented to person, place, and time and well-developed, well-nourished, and in no distress.  HENT:  Head: Normocephalic.  Mouth/Throat: Oropharynx is clear and moist.  Eyes: Conjunctivae and EOM are normal. Pupils are equal, round, and reactive to light. No scleral icterus.  Neck: Normal range of motion. Neck supple. No JVD present. No tracheal deviation present.  Cardiovascular: Normal rate, regular rhythm and normal heart sounds. Exam  reveals no gallop.  No murmur heard. Pulmonary/Chest: Effort normal and breath sounds normal. No respiratory distress. She has no wheezes. She has no rales.  Abdominal: Soft. Bowel sounds are normal. She exhibits no distension. There is no tenderness. There is no rebound.  Musculoskeletal: Normal range of motion. She exhibits no edema or tenderness.  Neurological: She is alert and oriented to person, place, and time. No cranial nerve deficit.  Skin: No rash noted. No erythema.  Psychiatric: Affect normal.   LABORATORY PANEL:  Female CBC Recent Labs  Lab 07/26/17 0506  WBC 7.2  HGB 12.7  HCT 40.1  PLT 167   ------------------------------------------------------------------------------------------------------------------ Chemistries  Recent Labs  Lab 07/26/17 0506 07/27/17 0415  NA 136 136  K 4.0 4.3  CL 103 103  CO2 21* 22  GLUCOSE 75 89  BUN 25* 24*  CREATININE 1.32* 1.50*  CALCIUM 8.9 9.4  MG  --  1.9  AST 123*  --   ALT 96*  --   ALKPHOS 139*  --   BILITOT 2.4*  --    RADIOLOGY:  No results found. ASSESSMENT AND PLAN:    AKI (acute kidney injury) due to dehydration. Worsening yesterday, hold Lasix.  Pleural effusion on right Hold Lasix due to worsening renal function and low blood pressure.  Community acquired pneumonia Continue Zithromax and Rocephin.   Acute respiratory failure with hypoxia due to acute on chronic diastolic CHF and pneumonia.  Improved, off oxygen by nasal cannula.  Acute on chronic diastolic CHF.   Hold Lasix. History of A. fib, status post pacemaker placement. Continue Eliquis. Decreased amiodarone to 200 mg once daily due to elevated liver enzymes per cardiology consult.  Diabetes.  Sliding scale. Generalized weakness.  PT evaluation suggest  skilled nursing facility placement.   But the patient daughter does not want the patient to be discharged to a skilled nursing facility.  She wants the patient to be discharged home with home  health and PT.  All the records are reviewed and case discussed with Care Management/Social Worker. Management plans discussed with the patient, her son and they are in agreement.  CODE STATUS: Full Code  TOTAL TIME TAKING CARE OF THIS PATIENT: 28 minutes.   More than 50% of the time was spent in counseling/coordination of care: YES  POSSIBLE D/C IN 1-2 DAYS, DEPENDING ON CLINICAL CONDITION.   Demetrios Loll M.D on 07/27/2017 at 4:08 PM  Between 7am to 6pm - Pager - [phone]  After 6pm go to [email or web address] - [facility]

## 2017-07-27 NOTE — Progress Notes (Signed)
[facility] Cardiology  SUBJECTIVE: The patient has no complaints this morning. She denies chest pain, shortness of breath, or palpitations.    Vitals:   07/26/17 1720 07/26/17 2008 07/27/17 0335 07/27/17 0729  BP: 105/64 (!) 99/59 (!) 98/58 (!) 101/56  Pulse: 69 69 69 71  Resp: 17 18 17 17   Temp: 97.6 F (36.4 C) (!) 97.4 F (36.3 C)  97.8 F (36.6 C)  TempSrc: Oral Oral  Oral  SpO2: 98% 98% 98% 97%  Weight:   77.5 kg (170 lb 14.4 oz)   Height:         Intake/Output Summary (Last 24 hours) at 07/27/2017 0801 Last data filed at 07/27/2017 0500 Gross per 24 hour  Intake 250 ml  Output 252 ml  Net -2 ml      PHYSICAL EXAM  General: Well developed, well nourished, elderly lady lying in bed at a slight incline, in no acute distress HEENT:  Normocephalic and atramatic Neck:  No JVD.  Lungs: Normal effort of breathing on room air, no wheezing, bibasilar crackles Heart: HRRR . 2/6 systolic murmur Abdomen: Bowel sounds are positive, abdomen soft  Msk:  Decreased strength and tone throughout for age. Extremities: Trace bilateral lower extremity edema.   Neuro: Alert and oriented X 3. Psych:  Good affect, responds appropriately   LABS: Basic Metabolic Panel: Recent Labs    07/25/17 1724 07/26/17 0506 07/27/17 0415  NA 137 136  --   K 4.2 4.0  --   CL 101 103  --   CO2 23 21*  --   GLUCOSE 86 75  --   BUN 24* 25*  --   CREATININE 1.45* 1.32*  --   CALCIUM 9.2 8.9  --   MG 1.8  --  1.9  PHOS  --   --  1.8*   Liver Function Tests: Recent Labs    07/25/17 1724 07/26/17 0506  AST 113*  115* 123*  ALT 92*  93* 96*  ALKPHOS 134*  137* 139*  BILITOT 2.5*  2.2* 2.4*  PROT 5.7*  5.7* 5.4*  ALBUMIN 3.0*  3.1* 2.9*   No results for input(s): LIPASE, AMYLASE in the last 72 hours. CBC: Recent Labs    07/25/17 1456 07/26/17 0506  WBC 9.5 7.2  NEUTROABS 7.6*  --   HGB 13.4 12.7  HCT 42.4 40.1  MCV 91.0 90.2  PLT 144* 167   Cardiac Enzymes: Recent Labs     07/25/17 2313 07/26/17 0506 07/26/17 1125  TROPONINI 0.03* 0.03* <0.03   BNP: Invalid input(s): POCBNP D-Dimer: No results for input(s): DDIMER in the last 72 hours. Hemoglobin A1C: No results for input(s): HGBA1C in the last 72 hours. Fasting Lipid Panel: No results for input(s): CHOL, HDL, LDLCALC, TRIG, CHOLHDL, LDLDIRECT in the last 72 hours. Thyroid Function Tests: Recent Labs    07/25/17 1724  TSH 3.974   Anemia Panel: No results for input(s): VITAMINB12, FOLATE, FERRITIN, TIBC, IRON, RETICCTPCT in the last 72 hours.  Ct Chest Wo Contrast  Result Date: 07/25/2017 CLINICAL DATA:  83 year old female with sick sinus syndrome with pacemaker placement, hypertension and diabetes admitted for pleural effusions and weakness. EXAM: CT CHEST WITHOUT CONTRAST TECHNIQUE: Multidetector CT imaging of the chest was performed following the standard protocol without IV contrast. COMPARISON:  07/25/2017 CXR, 05/20/2017 CT FINDINGS: Cardiovascular: Stable cardiomegaly with left anterior chest wall pacemaker apparatus with right atrial and right ventricular leads. No significant pericardial effusion or thickening. Mild-to-moderate aortic atherosclerosis without aneurysm. The  unenhanced main pulmonary artery is mildly dilated to 2.9 cm consistent with chronic pulmonary hypertension. Mediastinum/Nodes: Small prevascular and paratracheal lymph nodes without pathologic enlargement. The study is limited in the assessment for hilar adenopathy due to lack of IV contrast. Lungs/Pleura: Stable chronic left pleural effusion with slight interval increase in loculated right pleural effusion extending along the lateral aspect of the right lung base and into the major fissure. The lungs are otherwise stable in appearance without active pulmonary disease. Some passive atelectasis at the left lung base with compressive atelectasis bilaterally. Upper Abdomen: No acute abnormality. Hyperdense noncontrast appearance of the  liver can be seen with amiodarone use which the patient is apparently on per her medication list. Punctate calcification in the upper pole the right kidney is suggested, partially imaged. Musculoskeletal: No chest wall mass or suspicious bone lesions identified thoracic spondylosis. No acute nor suspicious osseous lesions. Osteoarthritis of the glenohumeral joints bilaterally. IMPRESSION: 1. Slight interval increase in loculated right pleural effusion stable small left pleural effusion. No active pulmonary disease. 2. Stable cardiomegaly with ICD device in place. 3. Aortic atherosclerosis without aneurysm. 4. Mild dilatation of the main pulmonary artery to 2.9 cm consistent with a component of chronic pulmonary arterial hypertension. 5. Hyperdense appearance the liver consistent with amiodarone use. 6. Calculus in the upper pole of the included right kidney. No obstructive uropathy. 7. Thoracic spondylosis and bilateral glenohumeral joint osteoarthritis. Aortic Atherosclerosis (ICD10-I70.0). Electronically Signed   By: Ashley Royalty M.D.   On: 07/25/2017 20:27   Dg Chest Portable 1 View  Result Date: 07/25/2017 CLINICAL DATA:  Weakness EXAM: PORTABLE CHEST 1 VIEW COMPARISON:  05/19/2017 FINDINGS: Cardiomegaly. Loculated right pleural effusion and small layering left pleural effusion noted. Bilateral lower lobe airspace opacities. Mild vascular congestion. No acute bony abnormality. Left pacer is unchanged. IMPRESSION: Moderate loculated right pleural effusion has increased since prior study. Small layering left pleural effusion. Bilateral lower lobe atelectasis or infiltrates. Cardiomegaly, vascular congestion. Electronically Signed   By: Rolm Baptise M.D.   On: 07/25/2017 16:08     Echo EF 50-55%, mild MR, mild AS, moderate TR  TELEMETRY: Paced rhythm, 70 bpm  ASSESSMENT AND PLAN:  Principal Problem:   AKI (acute kidney injury) ([place]) Active Problems:   Pleural effusion on right   Community acquired  pneumonia   Acute respiratory distress    1. Acute respiratory failure secondary to acute on chronic diastolic heart failure and pneumonia, on room air. 2. Acute kidney injury secondary to dehydration, improving 3. Atrial fibrillation on amiodarone, metoprolol and Eliquis. Rate 70 bpm. Dose of amiodarone reduced to 200 mg once daily from BID yesterday due to elevated liver enzymes. 4. SSS status post pacemaker 5. Generalized weakness  Recommendations: 1. Agree with overall therapy 2. Continue Eliquis for stroke prevention and for continued management of recent DVT 3. Continue antibiotics for pneumonia 4. Continue gentle diuresis with careful monitoring of renal status   Clabe Seal, PA-C 07/27/2017 8:01 AM

## 2017-07-27 NOTE — Telephone Encounter (Signed)
Patient is hospitalized inpatient.

## 2017-07-27 NOTE — NC FL2 (Signed)
[place] LEVEL OF CARE SCREENING TOOL     IDENTIFICATION  Patient Name: Kristin Avila Birthdate: Dec 04, 1927 Sex: female Admission Date (Current Location): 07/25/2017  [place] and [place] Number:  Engineering geologist and Address:  [facility], [address] [place], [place] [address]      Provider Number: [id number]  Attending Physician Name and Address:  Demetrios Loll, MD  Relative Name and Phone Number:  Denicola,Carol Daughter [phone]   or Ciearra, Rufo [phone] or Ilena, Dieckman [phone]     Current Level of Care: Hospital Recommended Level of Care: [place] Prior Approval Number:    Date Approved/Denied:   PASRR Number: [id number]  Discharge Plan: SNF    Current Diagnoses: Patient Active Problem List   Diagnosis Date Noted  . Acute respiratory distress 07/25/2017  . AKI (acute kidney injury) ([place]) 07/25/2017  . Sleeping difficulty 05/29/2017  . Symptomatic anemia 05/29/2017  . Iron deficiency anemia   . Iron deficiency anemia due to chronic blood loss   . DVT (deep venous thrombosis) ([employer]) 05/20/2017  . Community acquired pneumonia 05/20/2017  . Encephalopathy acute 04/29/2017  . Pleural effusion on right 04/18/2017  . Hypotension 04/18/2017  . Hyperlipidemia, mixed 02/01/2017  . Cellulitis of right upper extremity 01/27/2017  . Sick sinus syndrome ([place]) 01/04/2017  . Syncope 12/08/2016  . BCC (basal cell carcinoma), face 04/24/2016  . Decreased hearing 05/21/2015  . Benign essential hypertension 03/13/2015  . Constipation 01/13/2015  . Health care maintenance 10/14/2014  . Anxiety 10/02/2012  . Atrial fibrillation ([place]) 07/09/2012  . Osteoarthritis 07/08/2012  . Hypercholesterolemia 07/08/2012  . Hypertension 07/08/2012    Orientation RESPIRATION BLADDER Height & Weight     Self  Normal Continent Weight: 170 lb 14.4 oz (77.5 kg) Height:  5'  2" (157.5 cm)  BEHAVIORAL SYMPTOMS/MOOD NEUROLOGICAL BOWEL NUTRITION STATUS      Continent Diet(2G sodium diet)  AMBULATORY STATUS COMMUNICATION OF NEEDS Skin   Extensive Assist Verbally Normal                       Personal Care Assistance Level of Assistance  Bathing, Feeding, Dressing Bathing Assistance: Limited assistance Feeding assistance: Limited assistance Dressing Assistance: Limited assistance     Functional Limitations Info  Sight, Hearing, Speech Sight Info: Adequate Hearing Info: Adequate Speech Info: Adequate    SPECIAL CARE FACTORS FREQUENCY  PT (By licensed PT)     PT Frequency: 5x a week              Contractures Contractures Info: Not present    Additional Factors Info  Code Status, Insulin Sliding Scale Code Status Info: Full Code     Insulin Sliding Scale Info: insulin aspart (novoLOG) injection 0-9 Units 3x a day with meals.       Current Medications (07/27/2017):  This is the current hospital active medication list Current Facility-Administered Medications  Medication Dose Route Frequency Provider Last Rate Last Dose  . acetaminophen (TYLENOL) tablet 650 mg  650 mg Oral Q6H PRN Idelle Crouch, MD       Or  . acetaminophen (TYLENOL) suppository 650 mg  650 mg Rectal Q6H PRN Idelle Crouch, MD      . amiodarone (PACERONE) tablet 200 mg  200 mg Oral Daily Clabe Seal, PA-C   200 mg at 07/27/17 1152  . apixaban (ELIQUIS) tablet 5 mg  5 mg Oral BID Idelle Crouch, MD  5 mg at 07/27/17 1152  . azithromycin (ZITHROMAX) tablet 250 mg  250 mg Oral Daily Demetrios Loll, MD   250 mg at 07/27/17 1153  . bisacodyl (DULCOLAX) suppository 10 mg  10 mg Rectal Daily PRN Idelle Crouch, MD      . cholecalciferol (VITAMIN D) tablet 1,000 Units  1,000 Units Oral Daily Idelle Crouch, MD   1,000 Units at 07/27/17 1153  . docusate sodium (COLACE) capsule 100 mg  100 mg Oral BID Idelle Crouch, MD   100 mg at 07/27/17 1151  . feeding supplement  (ENSURE ENLIVE) (ENSURE ENLIVE) liquid 237 mL  237 mL Oral BID BM Demetrios Loll, MD      . ferrous sulfate tablet 325 mg  325 mg Oral BID WC Idelle Crouch, MD   325 mg at 07/27/17 1842  . FLUoxetine (PROZAC) capsule 40 mg  40 mg Oral Daily Idelle Crouch, MD   40 mg at 07/26/17 1010  . insulin aspart (novoLOG) injection 0-9 Units  0-9 Units Subcutaneous TID WC Idelle Crouch, MD      . ipratropium-albuterol (DUONEB) 0.5-2.5 (3) MG/3ML nebulizer solution 3 mL  3 mL Nebulization Q4H PRN Demetrios Loll, MD      . magnesium oxide (MAG-OX) tablet 400 mg  400 mg Oral BID Idelle Crouch, MD   400 mg at 07/27/17 1152  . metoprolol succinate (TOPROL-XL) 24 hr tablet 50 mg  50 mg Oral Daily Demetrios Loll, MD   50 mg at 07/26/17 1009  . ondansetron (ZOFRAN) tablet 4 mg  4 mg Oral Q6H PRN Idelle Crouch, MD       Or  . ondansetron (ZOFRAN) injection 4 mg  4 mg Intravenous Q6H PRN Idelle Crouch, MD      . pantoprazole (PROTONIX) EC tablet 40 mg  40 mg Oral BID Otho Perl, MD   40 mg at 07/27/17 1842  . phosphorus (K PHOS NEUTRAL) tablet 500 mg  500 mg Oral Q4H Demetrios Loll, MD   500 mg at 07/27/17 1844  . potassium chloride SA (K-DUR,KLOR-CON) CR tablet 20 mEq  20 mEq Oral Daily Demetrios Loll, MD   20 mEq at 07/27/17 1153  . pravastatin (PRAVACHOL) tablet 20 mg  20 mg Oral q1800 Idelle Crouch, MD   20 mg at 07/27/17 1842  . sodium chloride flush (NS) 0.9 % injection 3 mL  3 mL Intravenous Q12H Demetrios Loll, MD   3 mL at 07/26/17 2132  . traZODone (DESYREL) tablet 100 mg  100 mg Oral QHS PRN Idelle Crouch, MD   100 mg at 07/26/17 2127     Discharge Medications: Please see discharge summary for a list of discharge medications.  Relevant Imaging Results:  Relevant Lab Results:   Additional Information SSN [id number]  Ross Ludwig, [place]

## 2017-07-27 NOTE — Evaluation (Signed)
Clinical/Bedside Swallow Evaluation Patient Details  Name: Kristin Avila MRN: [id number] Date of Birth: 1928/05/06  Today's Date: 07/27/2017 Time: SLP Start Time (ACUTE ONLY): 0930 SLP Stop Time (ACUTE ONLY): 0950 SLP Time Calculation (min) (ACUTE ONLY): 20 min  Past Medical History:  Past Medical History:  Diagnosis Date  . Anemia   . Anxiety 10/02/2012  . Atrial fibrillation ([place]) 11/13   new onset 11/13  . BCC (basal cell carcinoma), face 04/24/2016  . Benign essential hypertension 03/13/2015  . Cellulitis of right upper extremity 01/27/2017  . Constipation 01/13/2015  . Decreased hearing 05/21/2015  . Health care maintenance 10/14/2014   Mammogram 10/01/15 - [facility] I.    . Hypercholesterolemia   . Hyperlipidemia, mixed 02/01/2017  . Hypertension   . Osteoarthritis    hands, feet  . Sick sinus syndrome ([place]) 01/04/2017   Overview:  Sp pacer placement 2018  . Syncope 12/08/2016   Past Surgical History:  Past Surgical History:  Procedure Laterality Date  . APPENDECTOMY  1957  . CATARACT EXTRACTION  2013   left eye  . COLONOSCOPY  09/06/2008  . COLONOSCOPY WITH PROPOFOL N/A 05/23/2017   Procedure: COLONOSCOPY WITH PROPOFOL;  Surgeon: Lucilla Lame, MD;  Location: [facility] ENDOSCOPY;  Service: Endoscopy;  Laterality: N/A;  . ESOPHAGOGASTRODUODENOSCOPY (EGD) WITH PROPOFOL N/A 05/23/2017   Procedure: ESOPHAGOGASTRODUODENOSCOPY (EGD) WITH PROPOFOL;  Surgeon: Lucilla Lame, MD;  Location: [facility] ENDOSCOPY;  Service: Endoscopy;  Laterality: N/A;  . INCISION AND DRAINAGE ABSCESS Right 01/29/2017   Procedure: INCISION AND DRAINAGE ABSCESS;  Surgeon: Florene Glen, MD;  Location: [facility] ORS;  Service: General;  Laterality: Right;  . KIDNEY SURGERY  age 29  . PACEMAKER INSERTION Left 12/14/2016   Procedure: INSERTION PACEMAKER;  Surgeon: Isaias Cowman, MD;  Location: [facility] ORS;  Service: Cardiovascular;  Laterality: Left;  . TUBAL LIGATION    . VIDEO ASSISTED THORACOSCOPY (VATS)/THOROCOTOMY  Right 04/19/2017   Procedure: RIGHT THORACOSCOPY AND EVACUATION OF HEMOTHORAX;  Surgeon: Nestor Lewandowsky, MD;  Location: [facility] ORS;  Service: General;  Laterality: Right;   HPI:  83 yo female admitted 07/25/17 with weakness, anorexia, and SOB. PMH significant for sick sinus syndrome s/p pacer, HTN, DM, HOH. CXR = BLL atelectasis vs infiltrate. Pt reports no history of dysphagia, but no appetite   Assessment / Plan / Recommendation Clinical Impression  Pt resting mid-breakfast upon arrival of SLP. Pt presents with adequate oral motor strength and function. Some missing teeth noted. Pt reports no history of swallowing difficulty, but did report no appetite. Pt tolerated multiple trials of water via straw without oral issues or overt s/s aspiration. Significant encouragement was required for pt to accept a small bite of potatoes, which she chewed for at least a minute, then spit out. She again required encouragement to accept a bit of french toast, which she chewed for 2-3 minutes, then attempted to spit out. SLP encouraged pt to swallow the bolus. Oral residue noted even after this length of time. Pt accepted trials of water and cleared oral cavity successfully. Cognitive impairment suspected, and is anticipated to be the primary issue with current swallowing status. While no overt s/s aspiration were observed or reported, pt is at increased risk due to advanced age and cognitive decline. Will continue Dys 3 diet and thin liquids.   ST will continue to follow to assess diet tolerance and provide education. RN informed.    SLP Visit Diagnosis: Dysphagia, unspecified (R13.10)    Aspiration Risk  Mild aspiration risk  Diet Recommendation Dysphagia 3 (Mech soft);Thin liquid   Liquid Administration via: Cup;Straw Medication Administration: Whole meds with liquid Supervision: Patient able to self feed;Intermittent supervision to cue for compensatory strategies Compensations: Minimize environmental  distractions;Slow rate;Small sips/bites Postural Changes: Seated upright at 90 degrees    Other  Recommendations Oral Care Recommendations: Oral care BID   Follow up Recommendations 24 hour supervision/assistance      Frequency and Duration min 1 x/week  1 week;2 weeks       Prognosis Prognosis for Safe Diet Advancement: Fair Barriers to Reach Goals: Cognitive deficits      Swallow Study   General Date of Onset: 07/25/17 HPI: 83 yo female admitted 07/25/17 with weakness, anorexia, and SOB. PMH significant for sick sinus syndrome s/p pacer, HTN, DM, HOH. CXR = BLL atelectasis vs infiltrate. Pt reports no history of dysphagia, but no appetite Type of Study: Bedside Swallow Evaluation Previous Swallow Assessment: none Diet Prior to this Study: Dysphagia 3 (soft);Thin liquids Temperature Spikes Noted: No Respiratory Status: Room air History of Recent Intubation: No Behavior/Cognition: Alert;Cooperative Oral Cavity Assessment: Within Functional Limits Oral Care Completed by SLP: No Oral Cavity - Dentition: Missing dentition Vision: Functional for self-feeding Self-Feeding Abilities: Able to feed self Patient Positioning: Upright in bed Baseline Vocal Quality: Normal Volitional Cough: Weak Volitional Swallow: Able to elicit    Oral/Motor/Sensory Function Overall Oral Motor/Sensory Function: Within functional limits   Ice Chips Ice chips: Not tested   Thin Liquid Thin Liquid: Within functional limits Presentation: Straw    Nectar Thick Nectar Thick Liquid: Not tested   Honey Thick Honey Thick Liquid: Not tested   Puree Puree: Not tested   Solid   GO   Solid: Impaired Oral Phase Impairments: Other (comment)(significantly extended oral prep) Oral Phase Functional Implications: Oral residue;Prolonged oral transit Pharyngeal Phase Impairments: (none)    Functional Assessment Tool Used: asha noms, clinical judgment, BSE Functional Limitations: Swallowing Swallow Current  Status (G3875): At least 1 percent but less than 20 percent impaired, limited or restricted Swallow Goal Status [phone]): At least 1 percent but less than 20 percent impaired, limited or restricted   Cortlan Dolin B. Quentin Ore, [facility], [place] Speech Language Pathologist [id number]  Colon Flattery Brown 07/27/2017,9:59 AM

## 2017-07-27 NOTE — Plan of Care (Signed)
  Progressing SLP Dysphagia Goals Patient will utilize recommended strategies Description Patient will utilize recommended strategies during swallow to increase swallowing safety with 07/27/2017 1001 - Progressing by Colon Flattery B, CCC-SLP Flowsheets Taken 07/27/2017 1001  Patient will utilize recommended strategies during swallow to increase swallowing safety with  min assist Misc Dysphagia Goal 07/27/2017 1001 - Progressing by Colon Flattery B, CCC-SLP Flowsheets Taken 07/27/2017 1001  Misc Dysphagia Goal  Pt will tolerate least restrictive diet without overt s/s aspiration or decline in respiratory status.

## 2017-07-27 NOTE — Plan of Care (Signed)
Patient has periods of confusion. Patient is yelling for grandson to help her get out of the bathroom. Patient has removed telemetry box and clothes. Continue to remind patient that she is in the hospital.

## 2017-07-27 NOTE — Clinical Social Work Note (Signed)
CSW spoke to patient's daughter, and CSW discussed PT recommendation for SNF placement.  CSW was informed that patient was recently at [facility] from October 19-November 10.  CSW explained to daughter that because patient has not had a 60 day wellness period of not being in the hospital or a skilled facility, patient will be in her copay days which would be 50 dollars a day if she went back to [place].  Patient's daughter was not happy about the information given she stated patient can not afford copays.  CSW explained to daughter the only other option is to go home with home health.  CSW discussed with patient she was not happy about these options, CSW explained unfortunately this is how the insurance company works.  Patient's daughter did agree to have CSW begin bed search in [place].  Jones Broom. Norval Morton, MSW, [place]  07/27/2017 6:19 PM

## 2017-07-28 DIAGNOSIS — E440 Moderate protein-calorie malnutrition: Secondary | ICD-10-CM

## 2017-07-28 LAB — COMPREHENSIVE METABOLIC PANEL
ALT: 112 U/L — AB (ref 14–54)
AST: 111 U/L — AB (ref 15–41)
Albumin: 3.1 g/dL — ABNORMAL LOW (ref 3.5–5.0)
Alkaline Phosphatase: 173 U/L — ABNORMAL HIGH (ref 38–126)
Anion gap: 13 (ref 5–15)
BUN: 24 mg/dL — AB (ref 6–20)
CHLORIDE: 101 mmol/L (ref 101–111)
CO2: 22 mmol/L (ref 22–32)
CREATININE: 1.51 mg/dL — AB (ref 0.44–1.00)
Calcium: 9.1 mg/dL (ref 8.9–10.3)
GFR calc Af Amer: 35 mL/min — ABNORMAL LOW (ref >60)
GFR, EST NON AFRICAN AMERICAN: 30 mL/min — AB (ref >60)
Glucose, Bld: 104 mg/dL — ABNORMAL HIGH (ref 65–99)
POTASSIUM: 4.5 mmol/L (ref 3.5–5.1)
SODIUM: 136 mmol/L (ref 135–145)
Total Bilirubin: 2.7 mg/dL — ABNORMAL HIGH (ref 0.3–1.2)
Total Protein: 5.8 g/dL — ABNORMAL LOW (ref 6.5–8.1)

## 2017-07-28 LAB — PHOSPHORUS: PHOSPHORUS: 2.5 mg/dL (ref 2.5–4.6)

## 2017-07-28 LAB — GLUCOSE, CAPILLARY
GLUCOSE-CAPILLARY: 96 mg/dL (ref 65–99)
GLUCOSE-CAPILLARY: 116 mg/dL — AB (ref 65–99)
Glucose-Capillary: 114 mg/dL — ABNORMAL HIGH (ref 65–99)
Glucose-Capillary: 118 mg/dL — ABNORMAL HIGH (ref 65–99)

## 2017-07-28 LAB — MAGNESIUM: MAGNESIUM: 1.9 mg/dL (ref 1.7–2.4)

## 2017-07-28 MED ORDER — ALPRAZOLAM 0.25 MG PO TABS
0.1250 mg | ORAL_TABLET | Freq: Every evening | ORAL | Status: DC | PRN
  Administered 2017-07-28: 0.125 mg via ORAL
  Filled 2017-07-28: qty 1

## 2017-07-28 MED ORDER — K PHOS MONO-SOD PHOS DI & MONO 155-852-130 MG PO TABS
500.0000 mg | ORAL_TABLET | ORAL | Status: AC
  Administered 2017-07-28 (×2): 500 mg via ORAL
  Filled 2017-07-28 (×2): qty 2

## 2017-07-28 MED ORDER — APIXABAN 5 MG PO TABS
5.0000 mg | ORAL_TABLET | Freq: Two times a day (BID) | ORAL | Status: DC
  Administered 2017-07-28 – 2017-07-30 (×4): 5 mg via ORAL
  Filled 2017-07-28 (×4): qty 1

## 2017-07-28 MED ORDER — APIXABAN 2.5 MG PO TABS: 2.5000 mg | ORAL_TABLET | Freq: Two times a day (BID) | ORAL | Status: DC

## 2017-07-28 NOTE — Progress Notes (Signed)
MEDICATION RELATED CONSULT NOTE - INITIAL   Pharmacy Consult for electrolyte management Indication: hypophosphatemia  No Known Allergies  Patient Measurements: Height: 5\' 2"  (157.5 cm) Weight: 172 lb 1.6 oz (78.1 kg) IBW/kg (Calculated) : 50.1 Adjusted Body Weight:   Vital Signs: Temp: 97.5 F (36.4 C) (12/19 0359) Temp Source: Oral (12/19 0359) BP: 100/60 (12/19 0359) Pulse Rate: 70 (12/19 0359) Intake/Output from previous day: 12/18 0701 - 12/19 0700 In: 120 [P.O.:120] Out: 300 [Urine:300] Intake/Output from this shift: No intake/output data recorded.  Labs: Recent Labs    07/25/17 1456  07/25/17 1724 07/26/17 0506 07/27/17 0415 07/28/17 0516  WBC 9.5  --   --  7.2  --   --   HGB 13.4  --   --  12.7  --   --   HCT 42.4  --   --  40.1  --   --   PLT 144*  --   --  167  --   --   CREATININE  --    < > 1.45* 1.32* 1.50* 1.51*  MG  --   --  1.8  --  1.9 1.9  PHOS  --   --   --   --  1.8* 2.5  ALBUMIN  --   --  3.0*  3.1* 2.9*  --  3.1*  PROT  --   --  5.7*  5.7* 5.4*  --  5.8*  AST  --   --  113*  115* 123*  --  111*  ALT  --   --  92*  93* 96*  --  112*  ALKPHOS  --   --  134*  137* 139*  --  173*  BILITOT  --   --  2.5*  2.2* 2.4*  --  2.7*  BILIDIR  --   --  1.2*  --   --   --   IBILI  --   --  1.0*  --   --   --    < > = values in this interval not displayed.   Estimated Creatinine Clearance: 25.4 mL/min (A) (by C-G formula based on SCr of 1.51 mg/dL (H)).   Microbiology: No results found for this or any previous visit (from the past 720 hour(s)).  Medical History: Past Medical History:  Diagnosis Date  . Anemia   . Anxiety 10/02/2012  . Atrial fibrillation ([place]) 11/13   new onset 11/13  . BCC (basal cell carcinoma), face 04/24/2016  . Benign essential hypertension 03/13/2015  . Cellulitis of right upper extremity 01/27/2017  . Constipation 01/13/2015  . Decreased hearing 05/21/2015  . Health care maintenance 10/14/2014   Mammogram 10/01/15 - [facility]  I.    . Hypercholesterolemia   . Hyperlipidemia, mixed 02/01/2017  . Hypertension   . Osteoarthritis    hands, feet  . Sick sinus syndrome ([place]) 01/04/2017   Overview:  Sp pacer placement 2018  . Syncope 12/08/2016    Medications:  Infusions:    Assessment: 89 yof cc AKI with pleural effusion (right), CAP, and ARD. Cardiology following. On Zithromax/Rocephin. Hx chronic diastolic HF on loop diuretic. Electrolyte abnormality noted, pharmacy consulted to manage electrolyte replacement and monitoring.   12/18 K 4.3, Ca 9.4, Mg 1.9, Phos 1.8 12/19 K 4.5, Ca 9.1, Mg 1.9, Phos 2.5  Goal of Therapy:  K 3.5 to 5 Ca 8.9 to 10.3 Mg 1.7 to 2.4 Phos 2.5 to 4.6  Plan:  Phosphorus now WNL although only two  of the four ordered doses were given yesterday. Will give K Phos Neutral tab 500 mg po Q4H x 2 doses and recheck electrolytes tomorrow with AM labs.   Laural Benes, Pharm.D., BCPS Clinical Pharmacist 07/28/2017,7:09 AM

## 2017-07-28 NOTE — Progress Notes (Addendum)
Physical Therapy Treatment Patient Details Name: Kristin Avila MRN: [id number] DOB: August 07, 1928 Today's Date: 07/28/2017    History of Present Illness Pt is an 83 y.o. female presenting to hospital with increasing weakness; recent hospital admission with electrolyte abnormalities (low potassium and magnesium).  Pt admitted with acute respiratory distress secondary to acute on chronic diastolic CHF and PNA, AKI, R pleural effusion, community acquired PNA, and a-fib.  PMH includes SSS with pacemaker, anemia, a-fib, htn, R thoracotomy, and DM.    PT Comments    Pt generally lethargic but will open eyes at times to answer questions.  Stated she feels "OK" but tired.  Poor effort for LE ex as described below in attempt to awaken pt for session.  To edge of bed with mod a x 2.  Max vc's to reach and assist with transition.  Once sitting, is able to hold her balance with supervision but unsafe to be left unattended.  Upon standing, pt requesting bedside commode.  Transferred with mod a x 2 and poor transfer quality.  Small bm noted.  Pt with hat in commode.  BM in hat.  Nurse tech notified.  She then transferred to recliner with same assist and quality.  Remained up in chair at end of session and tech encouraged to use +2 assist for all transfers.  SNF remains most appropriate discharge disposition at this time due to fall risk, and overall quality and assist level of mobility.   Follow Up Recommendations  SNF     Equipment Recommendations  Rolling walker with 5" wheels    Recommendations for Other Services       Precautions / Restrictions Precautions Precautions: Fall Restrictions Weight Bearing Restrictions: No    Mobility  Bed Mobility Overal bed mobility: Needs Assistance Bed Mobility: Supine to Sit     Supine to sit: Mod assist;+2 for physical assistance     General bed mobility comments: assist for trunk and scooting to edge of bed; increased effort and time to perform  (increased SOB noted).  Poor effort  Transfers Overall transfer level: Needs assistance Equipment used: Rolling walker (2 wheeled) Transfers: Sit to/from Stand Sit to Stand: +2 physical assistance;Mod assist            Ambulation/Gait Ambulation/Gait assistance: Mod assist;+2 physical assistance Ambulation Distance (Feet): 2 Feet Assistive device: Rolling walker (2 wheeled) Gait Pattern/deviations: Step-to pattern;Decreased step length - right;Decreased step length - left;Trunk flexed Gait velocity: decreased Gait velocity interpretation: <1.8 ft/sec, indicative of risk for recurrent falls General Gait Details: generally poor quality and decreased effort for session, lethargic   Stairs            Wheelchair Mobility    Modified Rankin (Stroke Patients Only)       Balance Overall balance assessment: Needs assistance   Sitting balance-Leahy Scale: Fair     Standing balance support: Bilateral upper extremity supported Standing balance-Leahy Scale: Poor Standing balance comment: Requires B UE support to maintain static standing balance (pt initially with posterior lean requiring assist to shift weight forward)                            Cognition Arousal/Alertness: Lethargic Behavior During Therapy: Flat affect Overall Cognitive Status: Difficult to assess  Exercises Other Exercises Other Exercises: BLE slr and heel slides x 10 bialterally with poor effort/lethargic Other Exercises: to commode for small bm and to void    General Comments        Pertinent Vitals/Pain Pain Assessment: No/denies pain    Home Living                      Prior Function            PT Goals (current goals can now be found in the care plan section) Progress towards PT goals: Progressing toward goals    Frequency    Min 2X/week      PT Plan Current plan remains appropriate     Co-evaluation              AM-PAC PT "6 Clicks" Daily Activity  Outcome Measure  Difficulty turning over in bed (including adjusting bedclothes, sheets and blankets)?: Unable Difficulty moving from lying on back to sitting on the side of the bed? : Unable Difficulty sitting down on and standing up from a chair with arms (e.g., wheelchair, bedside commode, etc,.)?: Unable Help needed moving to and from a bed to chair (including a wheelchair)?: A Lot Help needed walking in hospital room?: Total Help needed climbing 3-5 steps with a railing? : Total 6 Click Score: 7    End of Session Equipment Utilized During Treatment: Gait belt Activity Tolerance: Patient limited by fatigue;Patient limited by lethargy Patient left: in chair;with chair alarm set;with call bell/phone within reach Nurse Communication: Mobility status;Other (comment)       Time: 1000-1016 PT Time Calculation (min) (ACUTE ONLY): 16 min  Charges:  $Therapeutic Activity: 8-22 mins                    G Codes:      Chesley Noon, PTA 07/28/17, 10:26 AM

## 2017-07-28 NOTE — Care Management Important Message (Signed)
Important Message  Patient Details  Name: Kristin Avila MRN: [id number] Date of Birth: 02/08/1928   Medicare Important Message Given:  Yes    Marshell Garfinkel, RN 07/28/2017, 12:31 PM

## 2017-07-28 NOTE — Plan of Care (Signed)
  Progressing Spiritual Needs Ability to function at adequate level 07/28/2017 1503 - Progressing by Darrelyn Hillock, RN Education: Knowledge of General Education information will improve 07/28/2017 1503 - Progressing by Darrelyn Hillock, [place] Behavior/Discharge Planning: Ability to manage health-related needs will improve 07/28/2017 1503 - Progressing by Darrelyn Hillock, RN Clinical Measurements: Ability to maintain clinical measurements within normal limits will improve 07/28/2017 1503 - Progressing by Darrelyn Hillock, RN Diagnostic test results will improve 07/28/2017 1503 - Progressing by Darrelyn Hillock, RN Respiratory complications will improve 07/28/2017 1503 - Progressing by Darrelyn Hillock, RN Cardiovascular complication will be avoided 07/28/2017 1503 - Progressing by Darrelyn Hillock, RN Activity: Risk for activity intolerance will decrease 07/28/2017 1503 - Progressing by Darrelyn Hillock, RN Nutrition: Adequate nutrition will be maintained 07/28/2017 1503 - Progressing by Darrelyn Hillock, RN Coping: Level of anxiety will decrease 07/28/2017 1503 - Progressing by Darrelyn Hillock, RN Elimination: Will not experience complications related to bowel motility 07/28/2017 1503 - Progressing by Darrelyn Hillock, RN Will not experience complications related to urinary retention 07/28/2017 1503 - Progressing by Darrelyn Hillock, RN Pain Managment: General experience of comfort will improve 07/28/2017 1503 - Progressing by Darrelyn Hillock, RN Safety: Ability to remain free from injury will improve 07/28/2017 1503 - Progressing by Darrelyn Hillock, RN Skin Integrity: Risk for impaired skin integrity will decrease 07/28/2017 1503 - Progressing by Darrelyn Hillock, RN Education: Ability to demonstrate management of disease process will  improve 07/28/2017 1503 - Progressing by Darrelyn Hillock, RN Ability to verbalize understanding of medication therapies will improve 07/28/2017 1503 - Progressing by Darrelyn Hillock, RN Activity: Capacity to carry out activities will improve 07/28/2017 1503 - Progressing by Darrelyn Hillock, RN Cardiac: Ability to achieve and maintain adequate cardiopulmonary perfusion will improve 07/28/2017 1503 - Progressing by Darrelyn Hillock, RN

## 2017-07-28 NOTE — Clinical Social Work Note (Signed)
CSW presented bed offers to patient's daughter Arbie Cookey and she has accepted the offer for [facility]  CSW contacted [facility], who can accept patient once she is medically ready for discharge and orders have been received.  Jones Broom. [place], MSW, [place]  07/28/2017 3:05 PM

## 2017-07-28 NOTE — Progress Notes (Signed)
  Speech Language Pathology Treatment: Dysphagia  Patient Details Name: Kristin Avila MRN: [id number] DOB: 09/19/1927 Today's Date: 07/28/2017 Time: 0174-9449 SLP Time Calculation (min) (ACUTE ONLY): 40 min  Assessment / Plan / Recommendation Clinical Impression  Pt seen for ongoing toleration of diet; dysphagia 3 recommended initially by ST services. Pt sitting in chair at bedside w/ eyes closed - NSG reported pt had refused po's earlier. Sat w/ pt and gave max encouragement to attempt bites/sips. Pt maintained eyes closed posture and did not initially participate w/ SLP. W/ more encouragement, pt accepted 1 sip of liquid and 1 small bite of peaches; she expectorated a 2nd bite attempt. Pt required feeding as she did not hold the utensil or drink when offered. She refused anything else po.  This was a minimal assessment but pt appears to present w/ no gross oropharyngeal phase dysphagia and is at reduced risk for aspiration when following general aspiration precautions. Pt is lacking some dentition and w/ increased textured trials(solids), she may need increased time for full mastication of trials d/t lacking dentition. Pt consumed the thin liquid trial w/ no overt s/s of aspiration noted; NSG has not noted any swallowing issues w/ the liquids or Pills CRUSHED in Puree but max encouragement is necessary.  Pt does appear to present w/ moderate Cognitive decline(unsure of her baseline) which can impact timing of swallowing, and overall awareness and desire for oral intake. As pt appears to have Cognitive decline, SLP will recommend a Dysphagia level 3(MINCED meats, moistened foods), thin liquids; general aspiration precautions; Pills in Puree - Crushed if indicated for easier, safer swallowing d/t Cognitive decline. Feeding support at meals; monitoring for follow through w/ aspiration precautions. Education given to [place] on food options and preparation; monitoring bolus size and clearing mouth b/t  trials; pills in puree; moistening foods for easier mastication; aspiration precautions. ST services will be available if any decline in status while admitted. NSG agreed.  HPI HPI: Pt is an 83 yo female admitted 07/25/17 with weakness, anorexia, and SOB. PMH significant for depression, sick sinus syndrome s/p pacer, HTN, DM, HOH. CXR = BLL atelectasis vs infiltrate. Pt reports no history of dysphagia, but no appetite. Pt has had declining status w/ reduced ambulation at home per chart notes. Unsure of pt's baseline Cognitive status; currently quite declined and unwilling to take much po per report.      SLP Plan  Continue with current plan of care(ST services available as needed ) - Education.        Recommendations  Diet recommendations: Dysphagia 3 (mechanical soft);Dysphagia 2 (fine chop);Thin liquid(MINCED meats w/ gravy) Liquids provided via: Cup;Straw Medication Administration: Whole meds with puree(crushed if needed for safer swallowing) Supervision: Staff to assist with self feeding;Full supervision/cueing for compensatory strategies Compensations: Minimize environmental distractions;Slow rate;Small sips/bites;Lingual sweep for clearance of pocketing;Multiple dry swallows after each bite/sip;Follow solids with liquid Postural Changes and/or Swallow Maneuvers: Seated upright 90 degrees;Upright 30-60 min after meal                General recommendations: (Dietician f/u) Oral Care Recommendations: Oral care BID;Staff/trained caregiver to provide oral care Follow up Recommendations: Skilled Nursing facility(TBD) SLP Visit Diagnosis: Dysphagia, oropharyngeal phase (R13.12) Plan: Continue with current plan of care(ST services available as needed )       [place], MS, CCC-SLP Watson,Katherine 07/28/2017, 1:49 PM

## 2017-07-28 NOTE — Care Management (Addendum)
This RNCM received message on extension [phone] this AM with questions about co-pay and out of pocket expenses.  I have requested this information from the team to follow up. She feels that they will go to SNF regardless but will let me know if she changes her mind. At [phone]: I have notified patient's daughter Kristin Avila- She has a [employer] Medicare Advantage PPO plan with current coverage effective  03/10/14. No deductible. $4000 out of pocket max, of which she's met $3,772.96 for the year.  For SNF, it's a $0 copay for days 1-20 and a $50 copay per day for days 21 - 100. $0 copay for professional fees and 3 day hospital stay not required. Per their records, she's used 21 SNF days since 05/19/17.  If she goes by EMS, it'll be a $75 copay for each one way, medically necessary transport.

## 2017-07-28 NOTE — Progress Notes (Signed)
[facility]  SUBJECTIVE: The patient is resting in bed, drowsy, but answering questions with her eyes closed most of the time. Denies chest pain or palpitations. She states she feels short of breath at times, but is on room air, breathing comfortably.   Vitals:   07/27/17 1942 07/28/17 0359 07/28/17 0500 07/28/17 0807  BP: 99/62 100/60  104/61  Pulse: 70 70  70  Resp: 18   18  Temp:  (!) 97.5 F (36.4 C)  97.7 F (36.5 C)  TempSrc:  Oral    SpO2: 94% 90%  96%  Weight:   78.1 kg (172 lb 1.6 oz)   Height:         Intake/Output Summary (Last 24 hours) at 07/28/2017 0919 Last data filed at 07/28/2017 0359 Gross per 24 hour  Intake 120 ml  Output 0 ml  Net 120 ml      PHYSICAL EXAM  General: Well developed, well nourished, in no acute distress, drowsy, lying at an incline in bed HEENT:  Normocephalic and atramatic Neck:  No JVD.  Lungs: Clear bilaterally to auscultation and percussion. Heart: HRRR . Normal S1 and S2 without gallops or murmurs.  Abdomen: Bowel sounds are positive, abdomen soft  Msk: Decreased strength and tone throughout Extremities: No clubbing, cyanosis or edema.   Neuro: Alert and oriented. Psych:  Drowsy, responds appropriately   LABS: Basic Metabolic Panel: Recent Labs    07/27/17 0415 07/28/17 0516  NA 136 136  K 4.3 4.5  CL 103 101  CO2 22 22  GLUCOSE 89 104*  BUN 24* 24*  CREATININE 1.50* 1.51*  CALCIUM 9.4 9.1  MG 1.9 1.9  PHOS 1.8* 2.5   Liver Function Tests: Recent Labs    07/26/17 0506 07/28/17 0516  AST 123* 111*  ALT 96* 112*  ALKPHOS 139* 173*  BILITOT 2.4* 2.7*  PROT 5.4* 5.8*  ALBUMIN 2.9* 3.1*   No results for input(s): LIPASE, AMYLASE in the last 72 hours. CBC: Recent Labs    07/25/17 1456 07/26/17 0506  WBC 9.5 7.2  NEUTROABS 7.6*  --   HGB 13.4 12.7  HCT 42.4 40.1  MCV 91.0 90.2  PLT 144* 167   Cardiac Enzymes: Recent Labs    07/25/17 2313 07/26/17 0506 07/26/17 1125  TROPONINI 0.03* 0.03*  <0.03   BNP: Invalid input(s): POCBNP D-Dimer: No results for input(s): DDIMER in the last 72 hours. Hemoglobin A1C: No results for input(s): HGBA1C in the last 72 hours. Fasting Lipid Panel: No results for input(s): CHOL, HDL, LDLCALC, TRIG, CHOLHDL, LDLDIRECT in the last 72 hours. Thyroid Function Tests: Recent Labs    07/25/17 1724  TSH 3.974   Anemia Panel: No results for input(s): VITAMINB12, FOLATE, FERRITIN, TIBC, IRON, RETICCTPCT in the last 72 hours.  No results found.   Echo EF 50-55%, mild AS, mild MR, moderate TR  TELEMETRY: Paced rhythm, rate 70 bpm  ASSESSMENT AND PLAN:  Principal Problem:   AKI (acute kidney injury) ([place]) Active Problems:   Pleural effusion on right   Community acquired pneumonia   Acute respiratory distress    1. Acute respiratory failure with hypoxia secondary to acute on chronic diastolic CHF and pneumonia, improved, off of supplemental oxygen. Lasix on hold due to worsening renal function. 2. Atrial fibrillation, on Eliquis for stroke prevention, rate controlled. Lower amiodarone dose due to elevated liver enzymes. 3. SSS, status post pacemaker, appropriately pacing 4. Acute kidney injury, creatinine 1.51, BUN 24. Lasix held for now  Recommendations:  1. Agree with overall therapy 2. Agree to hold Lasix due to worsening kidney function 3. Decrease Eliquis dose to 2.5 mg BID due to worsening kidney function and advanced age. 4. Continue antibiotic for pneumonia 5. Continue amiodarone 200 mg once daily 6. Recommend follow-up as outpatient with Dr. Nehemiah Massed upon discharge.  Sign off for now; call with any questions.   Clabe Seal, PA-C 07/28/2017 9:19 AM

## 2017-07-28 NOTE — Progress Notes (Signed)
[place] [facility] [place] NAME: Kristin Avila    MR#:  [id number]  DATE OF BIRTH:  06/16/1928  SUBJECTIVE:  CHIEF COMPLAINT:   Chief Complaint  Patient presents with  . Weakness   The patient is sleepy.  Blood pressure is in the low side. REVIEW OF SYSTEMS:  Review of Systems  Constitutional: Positive for malaise/fatigue. Negative for chills and fever.  HENT: Negative for nosebleeds.   Eyes: Negative for blurred vision and double vision.  Respiratory: Negative for cough, hemoptysis, sputum production, shortness of breath and wheezing.   Cardiovascular: Negative for chest pain, palpitations, orthopnea and leg swelling.  Gastrointestinal: Negative for abdominal pain, blood in stool, constipation, diarrhea, melena, nausea and vomiting.  Genitourinary: Negative for dysuria, flank pain, frequency, hematuria and urgency.  Musculoskeletal: Negative for falls and joint pain.  Skin: Negative for rash.  Neurological: Positive for weakness. Negative for dizziness, focal weakness, seizures, loss of consciousness and headaches.  Endo/Heme/Allergies: Negative for polydipsia. Does not bruise/bleed easily.  Psychiatric/Behavioral: Negative for depression.    DRUG ALLERGIES:  No Known Allergies VITALS:  Blood pressure 104/61, pulse 70, temperature 97.7 F (36.5 C), resp. rate 18, height 5\' 2"  (1.575 m), weight 172 lb 1.6 oz (78.1 kg), SpO2 96 %. PHYSICAL EXAMINATION:  Physical Exam  Constitutional: She is oriented to person, place, and time and well-developed, well-nourished, and in no distress.  HENT:  Head: Normocephalic.  Mouth/Throat: Oropharynx is clear and moist.  Eyes: Conjunctivae and EOM are normal. Pupils are equal, round, and reactive to light. No scleral icterus.  Neck: Normal range of motion. Neck supple. No JVD present. No tracheal deviation present.  Cardiovascular: Normal rate, regular rhythm and normal heart sounds. Exam reveals no  gallop.  No murmur heard. Pulmonary/Chest: Effort normal and breath sounds normal. No respiratory distress. She has no wheezes. She has no rales.  Abdominal: Soft. Bowel sounds are normal. She exhibits no distension. There is no tenderness. There is no rebound.  Musculoskeletal: Normal range of motion. She exhibits no edema or tenderness.  Neurological: She is alert and oriented to person, place, and time. No cranial nerve deficit.  Skin: No rash noted. No erythema.  Psychiatric: Affect normal.   LABORATORY PANEL:  Female CBC Recent Labs  Lab 07/26/17 0506  WBC 7.2  HGB 12.7  HCT 40.1  PLT 167   ------------------------------------------------------------------------------------------------------------------ Chemistries  Recent Labs  Lab 07/28/17 0516  NA 136  K 4.5  CL 101  CO2 22  GLUCOSE 104*  BUN 24*  CREATININE 1.51*  CALCIUM 9.1  MG 1.9  AST 111*  ALT 112*  ALKPHOS 173*  BILITOT 2.7*   RADIOLOGY:  No results found. ASSESSMENT AND PLAN:    AKI (acute kidney injury) due to dehydration. Worsening yesterday, hold Lasix.  Pleural effusion on right Hold Lasix due to worsening renal function and low blood pressure.  Community acquired pneumonia Continue Zithromax.   Acute respiratory failure with hypoxia due to acute on chronic diastolic CHF and pneumonia.  Improved, off oxygen by nasal cannula.  Acute on chronic diastolic CHF.   Hold Lasix. History of A. fib, status post pacemaker placement. Continue Eliquis. Decreased amiodarone to 200 mg once daily due to elevated liver enzymes per cardiology consult.  Diabetes.  Sliding scale. Generalized weakness.  PT evaluation suggest skilled nursing facility placement.   But the patient daughter does not want the patient to be discharged to a skilled nursing facility.  She wants  the patient to be discharged home with home health and PT Per SW, he is working on skilled nursing facility placement.  The patient's  daughter is not happy about insurance issues.  All the records are reviewed and case discussed with Care Management/Social Worker. Management plans discussed with the patient, her son and they are in agreement.  CODE STATUS: Full Code  TOTAL TIME TAKING CARE OF THIS PATIENT: 25 minutes.   More than 50% of the time was spent in counseling/coordination of care: YES  POSSIBLE D/C IN 1-2 DAYS, DEPENDING ON CLINICAL CONDITION.   Demetrios Loll M.D on 07/28/2017 at 1:39 PM  Between 7am to 6pm - Pager - [phone]  After 6pm go to [email or web address] - [place] [facility]

## 2017-07-28 NOTE — Progress Notes (Signed)
SNF and Non-Emergent EMS Transport Benefits:  Number called: [phone] Rep: Olin Hauser Reference Number: [id number]  [facility] Medicare Advantage PPO plan active since 03/10/14 with no deductible.  Out of pocket max is $4000, of which $3,772.96 met so far.  In-network SNF: $0 copay for days 1-20 and a $50 copay per day for days 21 - 100. $0 copay for professional fees and 3 day hospital stay is not required.  Limited to 100 SNF days each benefit period.  Per rep, their records show 21 SNF days used since 05/19/17.  Josem Kaufmann is required: [phone].    Non-emergent EMS transport: $75copay for each one way medically necessary, Medicare covered trip.  Josem Kaufmann is not required.

## 2017-07-28 NOTE — Progress Notes (Signed)
Pharmacist - Prescriber Communication  Eliquis dose modified from 2.5 mg po BID to 5 mg po BID. Although patient meets criteria for reduced dose in atrial fibrillation, she has documented LUE DVT via U/S 05/19/17 and per Dr. Alveria Apley note from 05/31/17 she has chronic PE "which has occurred recently." Eliquis is not dose reduced in VTE. Hospitalist was made aware and approves change back to 5 mg po BID.  Stacy Deshler A. [place], [place].D., BCPS Clinical Pharmacist 07/28/2017 13:24

## 2017-07-29 ENCOUNTER — Inpatient Hospital Stay: Payer: Medicare Other

## 2017-07-29 LAB — BASIC METABOLIC PANEL
Anion gap: 8 (ref 5–15)
BUN: 26 mg/dL — AB (ref 6–20)
CHLORIDE: 103 mmol/L (ref 101–111)
CO2: 26 mmol/L (ref 22–32)
CREATININE: 1.54 mg/dL — AB (ref 0.44–1.00)
Calcium: 8.4 mg/dL — ABNORMAL LOW (ref 8.9–10.3)
GFR calc Af Amer: 34 mL/min — ABNORMAL LOW (ref >60)
GFR calc non Af Amer: 29 mL/min — ABNORMAL LOW (ref >60)
GLUCOSE: 87 mg/dL (ref 65–99)
POTASSIUM: 3.8 mmol/L (ref 3.5–5.1)
Sodium: 137 mmol/L (ref 135–145)

## 2017-07-29 LAB — GLUCOSE, CAPILLARY
GLUCOSE-CAPILLARY: 126 mg/dL — AB (ref 65–99)
GLUCOSE-CAPILLARY: 98 mg/dL (ref 65–99)
GLUCOSE-CAPILLARY: 117 mg/dL — AB (ref 65–99)
Glucose-Capillary: 83 mg/dL (ref 65–99)

## 2017-07-29 LAB — PHOSPHORUS: Phosphorus: 3 mg/dL (ref 2.5–4.6)

## 2017-07-29 LAB — MAGNESIUM: MAGNESIUM: 1.9 mg/dL (ref 1.7–2.4)

## 2017-07-29 MED ORDER — FUROSEMIDE 10 MG/ML IJ SOLN
40.0000 mg | Freq: Two times a day (BID) | INTRAMUSCULAR | Status: DC
  Administered 2017-07-29 – 2017-07-30 (×2): 40 mg via INTRAVENOUS
  Filled 2017-07-29 (×2): qty 4

## 2017-07-29 MED ORDER — ALPRAZOLAM 0.25 MG PO TABS
0.1250 mg | ORAL_TABLET | Freq: Three times a day (TID) | ORAL | Status: DC | PRN
  Administered 2017-07-29 (×2): 0.125 mg via ORAL
  Filled 2017-07-29 (×2): qty 1

## 2017-07-29 MED ORDER — ZINC OXIDE 40 % EX OINT
TOPICAL_OINTMENT | Freq: Two times a day (BID) | CUTANEOUS | Status: DC
  Administered 2017-07-29 (×2): via TOPICAL
  Filled 2017-07-29: qty 114

## 2017-07-29 MED ORDER — FUROSEMIDE 10 MG/ML IJ SOLN
40.0000 mg | Freq: Every day | INTRAMUSCULAR | Status: DC
  Filled 2017-07-29: qty 4

## 2017-07-29 NOTE — Progress Notes (Signed)
MEDICATION RELATED CONSULT NOTE - INITIAL   Pharmacy Consult for electrolyte management Indication: hypophosphatemia  No Known Allergies  Patient Measurements: Height: 5\' 2"  (157.5 cm) Weight: 170 lb 8 oz (77.3 kg) IBW/kg (Calculated) : 50.1 Adjusted Body Weight:   Vital Signs: Temp: 97.7 F (36.5 C) (12/20 0448) Temp Source: Oral (12/20 0448) BP: 99/69 (12/20 0448) Pulse Rate: 70 (12/20 0448) Intake/Output from previous day: 12/19 0701 - 12/20 0700 In: 0  Out: 300 [Urine:300] Intake/Output from this shift: No intake/output data recorded.  Labs: Recent Labs    07/27/17 0415 07/28/17 0516 07/29/17 0312  CREATININE 1.50* 1.51* 1.54*  MG 1.9 1.9 1.9  PHOS 1.8* 2.5 3.0  ALBUMIN  --  3.1*  --   PROT  --  5.8*  --   AST  --  111*  --   ALT  --  112*  --   ALKPHOS  --  173*  --   BILITOT  --  2.7*  --    Estimated Creatinine Clearance: 24.8 mL/min (A) (by C-G formula based on SCr of 1.54 mg/dL (H)).   Microbiology: No results found for this or any previous visit (from the past 720 hour(s)).  Medical History: Past Medical History:  Diagnosis Date  . Anemia   . Anxiety 10/02/2012  . Atrial fibrillation ([place]) 11/13   new onset 11/13  . BCC (basal cell carcinoma), face 04/24/2016  . Benign essential hypertension 03/13/2015  . Cellulitis of right upper extremity 01/27/2017  . Constipation 01/13/2015  . Decreased hearing 05/21/2015  . Health care maintenance 10/14/2014   Mammogram 10/01/15 - Birads I.    . Hypercholesterolemia   . Hyperlipidemia, mixed 02/01/2017  . Hypertension   . Osteoarthritis    hands, feet  . Sick sinus syndrome ([place]) 01/04/2017   Overview:  Sp pacer placement 2018  . Syncope 12/08/2016    Medications:  Infusions:    Assessment: 89 yof cc AKI with pleural effusion (right), CAP, and ARD. Cardiology following. On Zithromax/Rocephin. Hx chronic diastolic HF on loop diuretic. Electrolyte abnormality noted, pharmacy consulted to manage electrolyte  replacement and monitoring.   12/18 K 4.3, Ca 9.4, Mg 1.9, Phos 1.8 12/19 K 4.5, Ca 9.1, Mg 1.9, Phos 2.5 12/20 K 3.8, Ca 8.4, Mg 1.9, Phos 3  Goal of Therapy:  K 3.5 to 5 Ca 8.9 to 10.3 Mg 1.7 to 2.4 Phos 2.5 to 4.6  Plan:  Electrolytes WNL. Diuretic held for worsening renal function. Continue current regimen of Klor-Con 20 mEq po once daily and magnesium oxide 400 mg po BID. Will recheck electrolytes with AM labs.  Laural Benes, Pharm.D., BCPS Clinical Pharmacist 07/29/2017,7:17 AM

## 2017-07-29 NOTE — Progress Notes (Signed)
Nutrition Follow Up Note   DOCUMENTATION CODES:   Non-severe (moderate) malnutrition in context of chronic illness  INTERVENTION:   Ensure Enlive po BID, each supplement provides 350 kcal and 20 grams of protein  Dysphagia 3 diet   NUTRITION DIAGNOSIS:   Moderate Malnutrition related to catabolic illness(CHF, advanced age) as evidenced by mild fat depletion, moderate to severe muscle depletions.  GOAL:   Patient will meet greater than or equal to 90% of their needs  -not meeting  MONITOR:   PO intake, Supplement acceptance, Labs, Weight trends, I & O's  ASSESSMENT:   83 y.o. female presenting to hospital with increasing weakness; recent hospital admission with electrolyte abnormalities (low potassium and magnesium).  Pt admitted with acute respiratory distress secondary to acute on chronic diastolic CHF and PNA, AKI, R pleural effusion, community acquired PNA, and a-fib.  PMH includes SSS with pacemaker, anemia, a-fib, htn, R thoracotomy, and DM.   Pt continues to have poor appetite and oral intake. Pt eating <25% of meals but is drinking some Ensure. Pt s/p CSE 12/18 and approved for dysphagia 3 diet. Pt may need nutrition support if her oral intake does not improve in 2-3 days. Pt is at refeeding risk; pharmacy consulted for electrolyte management. Per chart, pt is down 4lbs since admit; wt loss likely related to fluid changes. RD will continue to monitor for the need for nutrition support.   Medications reviewed and include: azithromycin, vitamin D, colace, ferrous sulfate, lasix, insulin, Mg oxide, protonix, KCl  Labs reviewed: BUN 26(H), creat 1.54(H), Ca 8.4(L), P 3.0 wnl, Mg 1.9 wnl BNP- 1229(H)- 12/16  Diet Order:  DIET DYS 3 Room service appropriate? Yes with Assist; Fluid consistency: Thin  EDUCATION NEEDS:   No education needs have been identified at this time  Skin:  Reviewed RN Assessment  Last BM:  12/20- type 7  Height:   Ht Readings from Last 1  Encounters:  07/25/17 5\' 2"  (1.575 m)    Weight:   Wt Readings from Last 1 Encounters:  07/29/17 170 lb 8 oz (77.3 kg)    Ideal Body Weight:  50 kg  BMI:  Body mass index is 31.18 kg/m.  Estimated Nutritional Needs:   Kcal:  1400-1700kcal/day   Protein:  63-87g/day   Fluid:  >1.4L/day   Koleen Distance MS, RD, LDN Pager #[phone] After Hours Pager: [phone]

## 2017-07-29 NOTE — Plan of Care (Signed)
Rash to thighs and groin area. Orders placed for ointment.

## 2017-07-29 NOTE — Progress Notes (Signed)
Notified MD of rash to thigh and groin area. Orders placed. Will continue to monitor and assess.

## 2017-07-29 NOTE — Progress Notes (Signed)
Chaplain made a follow up visit with pt and met pt and son's at bedside. Pt's son, Josph Macho told Margaret that his mother has not been eating well for past couple of days. Josph Macho and his brother expressed concern about their mother's health status. They used the phrases such as "it doesn't look good, she is going down the cliff," in their conversation with Chaplain. Pt's son asked [facility] to provide prayer, which chaplain offered with a ministry of presence. CH is available to follow up pt as needed.    07/29/17 1100  Clinical Encounter Type  Visited With Patient;Patient and family together  Visit Type Follow-up;Spiritual support  Referral From Family  Consult/Referral To Chaplain  Spiritual Encounters  Spiritual Needs Prayer

## 2017-07-29 NOTE — Progress Notes (Signed)
Patient's daughter updated on patient status. Patient continues to have periods of confusion and yells for help to get out of bed. This RN reminded her that she's in the hospital. PRN trazodone and Xanax given. Nursing staff will continue to monitor for any changes in patient status. Earleen Reaper, RN

## 2017-07-29 NOTE — Progress Notes (Signed)
Kristin Avila at [place] NAME: Kristin Avila    MR#:  [id number]  DATE OF BIRTH:  1927/12/13  SUBJECTIVE:  CHIEF COMPLAINT:   Chief Complaint  Patient presents with  . Weakness   - complains of dyspnea, on 2-3L o2 which is acute -Son at bedside.  REVIEW OF SYSTEMS:  Review of Systems  Constitutional: Positive for malaise/fatigue. Negative for chills and fever.  HENT: Negative for congestion, ear discharge, hearing loss and nosebleeds.   Eyes: Negative for blurred vision and double vision.  Respiratory: Positive for shortness of breath. Negative for cough and wheezing.   Cardiovascular: Positive for leg swelling. Negative for chest pain and palpitations.  Gastrointestinal: Negative for abdominal pain, constipation, diarrhea, nausea and vomiting.  Genitourinary: Negative for dysuria.  Musculoskeletal: Negative for myalgias.  Neurological: Negative for dizziness, speech change, focal weakness, seizures and headaches.  Psychiatric/Behavioral: Negative for depression.    DRUG ALLERGIES:  No Known Allergies  VITALS:  Blood pressure (!) 95/53, pulse 70, temperature 97.7 F (36.5 C), temperature source Oral, resp. rate 14, height 5\' 2"  (1.575 m), weight 77.3 kg (170 lb 8 oz), SpO2 95 %.  PHYSICAL EXAMINATION:  Physical Exam  GENERAL:  83 y.o.-year-old patient lying in the bed with no acute distress.  EYES: Pupils equal, round, reactive to light and accommodation. No scleral icterus. Extraocular muscles intact.  HEENT: Head atraumatic, normocephalic. Oropharynx and nasopharynx clear.  NECK:  Supple, no jugular venous distention. No thyroid enlargement, no tenderness.  LUNGS: Normal breath sounds bilaterally, basilar rails. No wheezing. No use of accessory muscles for breathing CARDIOVASCULAR: S1, S2 normal. No  rubs, or gallops. 2/6 systolic murmur present ABDOMEN: Soft, nontender, nondistended. Bowel sounds present. No organomegaly or  mass.  EXTREMITIES: No pedal edema, cyanosis, or clubbing.  NEUROLOGIC: Cranial nerves II through XII are intact. Muscle strength 5/5 in all extremities. Sensation intact. Gait not checked. Global weakness PSYCHIATRIC: The patient is alert and oriented x 3.  SKIN: No obvious rash, lesion, or ulcer.    LABORATORY PANEL:   CBC Recent Labs  Lab 07/26/17 0506  WBC 7.2  HGB 12.7  HCT 40.1  PLT 167   ------------------------------------------------------------------------------------------------------------------  Chemistries  Recent Labs  Lab 07/28/17 0516 07/29/17 0312  NA 136 137  K 4.5 3.8  CL 101 103  CO2 22 26  GLUCOSE 104* 87  BUN 24* 26*  CREATININE 1.51* 1.54*  CALCIUM 9.1 8.4*  MG 1.9 1.9  AST 111*  --   ALT 112*  --   ALKPHOS 173*  --   BILITOT 2.7*  --    ------------------------------------------------------------------------------------------------------------------  Cardiac Enzymes Recent Labs  Lab 07/26/17 1125  TROPONINI <0.03   ------------------------------------------------------------------------------------------------------------------  RADIOLOGY:  Dg Chest 2 View  Result Date: 07/29/2017 CLINICAL DATA:  Progressive weakness, shortness of breath, and anorexia. History of CHF, diabetes, and recently admitted for pleural effusion and a electrolyte imbalance. EXAM: CHEST  2 VIEW COMPARISON:  Chest x-ray and chest CT scan of July 25, 2017 FINDINGS: Again demonstrated is abnormal soft tissue density in the right pleural space consistent with loculated pleural effusion. The right upper lobe is clear. On the left the interstitial markings are increased diffusely. The left hemidiaphragm remains obscured and somewhat more retrocardiac density is present. The cardiac silhouette remains enlarged. The ICD is in stable position. There is calcification in the wall of the aortic arch. IMPRESSION: Stable appearance of the right pleural space consistent with  loculated pleural  effusion. CHF with pulmonary interstitial edema greatest on the left. Probable left pleural effusion slightly more conspicuous today. Left basilar atelectasis or pneumonia is more conspicuous today Thoracic aortic atherosclerosis. Electronically Signed   By: David  Martinique M.D.   On: 07/29/2017 09:50    EKG:   Orders placed or performed during the hospital encounter of 07/25/17  . ED EKG  . ED EKG    ASSESSMENT AND PLAN:   83 year old female with past medical history significant for sick sinus syndrome status post pacemaker, hypertension, diabetes mellitus presents to the hospital secondary to worsening shortness of breath and noted to have acute on chronic congestive heart failure.  1. Acute hypoxic respiratory failure-secondary to acute on chronic diastolic CHF exacerbation. -Echocardiogram with normal systolic function EF of 85-63% -Left pleural effusion noted. -Restarted Lasix today. -On 3 L oxygen now which is acute. -Wean as tolerated. -Still has some left pleural effusion, if not improving, consider thoracentesis  2. Acute renal failure-some proteinuria on the urine analysis. Could be cardiorenal syndrome. -Lasix was held due to worsening renal failure. However appears more congested. -Continue IV Lasix today and follow up labs in a.m. Next and 3. Elevated LFTs-could be hepatic congestion. Again monitor while on Lasix.  3. Atrial fibrillation-on amiodarone. On metoprolol for rate control -On eliquis for anticoagulation.  4. GERD-on Protonix twice a day  5. DVT prophylaxis-oncology on eliquis  Physical therapy recommended rehabilitation.   All the records are reviewed and case discussed with Care Management/Social Workerr. Management plans discussed with the patient, family and they are in agreement.  CODE STATUS: Full Code  TOTAL TIME TAKING CARE OF THIS PATIENT: 37 minutes.   POSSIBLE D/C IN 1-2 DAYS, DEPENDING ON CLINICAL  CONDITION.   Gladstone Lighter M.D on 07/29/2017 at 2:17 PM  Between 7am to 6pm - Pager - [phone]  After 6pm go to [email or web address] - password [facility]  Office  [phone]  CC: Primary care physician; Einar Pheasant, MD

## 2017-07-29 NOTE — Plan of Care (Signed)
  Education: Knowledge of General Education information will improve 07/29/2017 1417 - Not Progressing by Daron Offer, RN Note Current mentation prohibitive. Patient screaming for someone named "Gerald Stabs" earlier in shift. Will continue to monitor neurological status. Wenda Low [facility]

## 2017-07-30 LAB — GLUCOSE, CAPILLARY
Glucose-Capillary: 77 mg/dL (ref 65–99)
Glucose-Capillary: 110 mg/dL — ABNORMAL HIGH (ref 65–99)

## 2017-07-30 LAB — COMPREHENSIVE METABOLIC PANEL
ALT: 99 U/L — AB (ref 14–54)
AST: 92 U/L — AB (ref 15–41)
Albumin: 2.9 g/dL — ABNORMAL LOW (ref 3.5–5.0)
Alkaline Phosphatase: 159 U/L — ABNORMAL HIGH (ref 38–126)
Anion gap: 9 (ref 5–15)
BUN: 26 mg/dL — ABNORMAL HIGH (ref 6–20)
CHLORIDE: 103 mmol/L (ref 101–111)
CO2: 26 mmol/L (ref 22–32)
CREATININE: 1.59 mg/dL — AB (ref 0.44–1.00)
Calcium: 9 mg/dL (ref 8.9–10.3)
GFR calc non Af Amer: 28 mL/min — ABNORMAL LOW (ref >60)
GFR, EST AFRICAN AMERICAN: 33 mL/min — AB (ref >60)
Glucose, Bld: 81 mg/dL (ref 65–99)
POTASSIUM: 3.8 mmol/L (ref 3.5–5.1)
SODIUM: 138 mmol/L (ref 135–145)
Total Bilirubin: 2.6 mg/dL — ABNORMAL HIGH (ref 0.3–1.2)
Total Protein: 5.7 g/dL — ABNORMAL LOW (ref 6.5–8.1)

## 2017-07-30 LAB — MAGNESIUM: Magnesium: 2.2 mg/dL (ref 1.7–2.4)

## 2017-07-30 LAB — PHOSPHORUS: PHOSPHORUS: 2.1 mg/dL — AB (ref 2.5–4.6)

## 2017-07-30 MED ORDER — AMIODARONE HCL 200 MG PO TABS: 200.0000 mg | ORAL_TABLET | Freq: Every day | ORAL | 1 refills | Status: AC

## 2017-07-30 MED ORDER — POTASSIUM CHLORIDE CRYS ER 20 MEQ PO TBCR: 20.0000 meq | EXTENDED_RELEASE_TABLET | Freq: Every day | ORAL | 0 refills | Status: AC

## 2017-07-30 MED ORDER — MAGNESIUM OXIDE 400 (241.3 MG) MG PO TABS: 400.0000 mg | ORAL_TABLET | Freq: Every day | ORAL | 2 refills | Status: AC

## 2017-07-30 MED ORDER — DOCUSATE SODIUM 100 MG PO CAPS: 100.0000 mg | ORAL_CAPSULE | Freq: Two times a day (BID) | ORAL | 0 refills | Status: AC

## 2017-07-30 MED ORDER — FUROSEMIDE 40 MG PO TABS: 40.0000 mg | ORAL_TABLET | Freq: Every day | ORAL | 4 refills | Status: AC

## 2017-07-30 MED ORDER — ALPRAZOLAM 0.25 MG PO TABS: 0.1250 mg | ORAL_TABLET | Freq: Two times a day (BID) | ORAL | 0 refills | Status: AC | PRN

## 2017-07-30 MED ORDER — K PHOS MONO-SOD PHOS DI & MONO 155-852-130 MG PO TABS
500.0000 mg | ORAL_TABLET | ORAL | Status: DC
  Filled 2017-07-30 (×4): qty 2

## 2017-07-30 MED ORDER — IPRATROPIUM-ALBUTEROL 0.5-2.5 (3) MG/3ML IN SOLN: 3.0000 mL | Freq: Four times a day (QID) | RESPIRATORY_TRACT | 0 refills | Status: AC | PRN

## 2017-07-30 MED ORDER — PANTOPRAZOLE SODIUM 40 MG PO TBEC: 40.0000 mg | DELAYED_RELEASE_TABLET | Freq: Two times a day (BID) | ORAL | 2 refills | Status: AC

## 2017-07-30 MED ORDER — MAGNESIUM OXIDE 400 (241.3 MG) MG PO TABS
400.0000 mg | ORAL_TABLET | Freq: Every day | ORAL | Status: DC
  Administered 2017-07-30: 400 mg via ORAL
  Filled 2017-07-30: qty 1

## 2017-07-30 MED ORDER — ENSURE ENLIVE PO LIQD: 237.0000 mL | Freq: Two times a day (BID) | ORAL | 12 refills | Status: AC

## 2017-07-30 NOTE — Clinical Social Work Note (Addendum)
Patient to be d/c'ed today to [facility] room 317.  Patient and family agreeable to plans will transport via ems RN to call report to [phone].  Patient's family was at bedside and is aware that patient is discharging today.  Kristin Avila, MSW, [place]

## 2017-07-30 NOTE — Progress Notes (Signed)
Pt discharge to [facility], report called, information packet given to EMS, family at bedside and updated with pt. Information. Pt VSS, on 2 L [place] at this time.

## 2017-07-30 NOTE — Progress Notes (Signed)
MEDICATION RELATED CONSULT NOTE - INITIAL   Pharmacy Consult for electrolyte management Indication: hypophosphatemia  No Known Allergies  Patient Measurements: Height: 5\' 2"  (157.5 cm) Weight: 171 lb 14.4 oz (78 kg) IBW/kg (Calculated) : 50.1 Adjusted Body Weight:   Vital Signs: Temp: 97.7 F (36.5 C) (12/21 0327) Temp Source: Oral (12/21 0327) BP: 109/60 (12/21 0327) Pulse Rate: 70 (12/21 0327) Intake/Output from previous day: 12/20 0701 - 12/21 0700 In: -  Out: 100 [Urine:100] Intake/Output from this shift: No intake/output data recorded.  Labs: Recent Labs    07/28/17 0516 07/29/17 0312 07/30/17 0358  CREATININE 1.51* 1.54* 1.59*  MG 1.9 1.9 2.2  PHOS 2.5 3.0 2.1*  ALBUMIN 3.1*  --  2.9*  PROT 5.8*  --  5.7*  AST 111*  --  92*  ALT 112*  --  99*  ALKPHOS 173*  --  159*  BILITOT 2.7*  --  2.6*   Estimated Creatinine Clearance: 24.1 mL/min (A) (by C-G formula based on SCr of 1.59 mg/dL (H)).   Microbiology: No results found for this or any previous visit (from the past 720 hour(s)).  Medical History: Past Medical History:  Diagnosis Date  . Anemia   . Anxiety 10/02/2012  . Atrial fibrillation ([place]) 11/13   new onset 11/13  . BCC (basal cell carcinoma), face 04/24/2016  . Benign essential hypertension 03/13/2015  . Cellulitis of right upper extremity 01/27/2017  . Constipation 01/13/2015  . Decreased hearing 05/21/2015  . Health care maintenance 10/14/2014   Mammogram 10/01/15 - Birads I.    . Hypercholesterolemia   . Hyperlipidemia, mixed 02/01/2017  . Hypertension   . Osteoarthritis    hands, feet  . Sick sinus syndrome ([place]) 01/04/2017   Overview:  Sp pacer placement 2018  . Syncope 12/08/2016    Medications:  Infusions:    Assessment: 89 yof cc AKI with pleural effusion (right), CAP, and ARD. Cardiology following. On Zithromax/Rocephin. Hx chronic diastolic HF on loop diuretic. Electrolyte abnormality noted, pharmacy consulted to manage electrolyte  replacement and monitoring.   12/18 K 4.3, Ca 9.4, Mg 1.9, Phos 1.8 12/19 K 4.5, Ca 9.1, Mg 1.9, Phos 2.5 12/20 K 3.8, Ca 8.4, Mg 1.9, Phos 3 12/21 K 3.8, Ca 9, Mg 2.2, Phos 2.1  Goal of Therapy:  K 3.5 to 5 Ca 8.9 to 10.3 Mg 1.7 to 2.4 Phos 2.5 to 4.6  Plan:  Phos low. Will give K Phos Neutral tab 500 mg po Q4H x 4 doses. Magnesium improved, will decrease to one tablet po daily. Diuretic resumed yesterday, SCr continues to slowly rise. Continue current regimen of Klor-Con 20 mEq po once daily. Will recheck electrolytes with AM labs.  Laural Benes, Pharm.D., BCPS Clinical Pharmacist 07/30/2017,7:19 AM

## 2017-07-30 NOTE — Discharge Summary (Signed)
[facility] [place] NAME: Kristin Avila    MR#:  [id number]  DATE OF BIRTH:  Jul 28, 1928  DATE OF ADMISSION:  07/25/2017   ADMITTING PHYSICIAN: Idelle Crouch, MD  DATE OF DISCHARGE: 07/30/17  PRIMARY CARE PHYSICIAN: Einar Pheasant, MD   ADMISSION DIAGNOSIS:   Weakness [R53.1] Acute on chronic systolic congestive heart failure ([place]) [I50.23]  DISCHARGE DIAGNOSIS:   Principal Problem:   AKI (acute kidney injury) ([place]) Active Problems:   Pleural effusion on right   Community acquired pneumonia   Acute respiratory distress   Malnutrition of moderate degree   SECONDARY DIAGNOSIS:   Past Medical History:  Diagnosis Date  . Anemia   . Anxiety 10/02/2012  . Atrial fibrillation ([place]) 11/13   new onset 11/13  . BCC (basal cell carcinoma), face 04/24/2016  . Benign essential hypertension 03/13/2015  . Cellulitis of right upper extremity 01/27/2017  . Constipation 01/13/2015  . Decreased hearing 05/21/2015  . Health care maintenance 10/14/2014   Mammogram 10/01/15 - [facility] I.    . Hypercholesterolemia   . Hyperlipidemia, mixed 02/01/2017  . Hypertension   . Osteoarthritis    hands, feet  . Sick sinus syndrome ([place]) 01/04/2017   Overview:  Sp pacer placement 2018  . Syncope 12/08/2016    HOSPITAL COURSE:   83 year old female with past medical history significant for sick sinus syndrome status post pacemaker, hypertension, diabetes mellitus presents to the hospital secondary to worsening shortness of breath and noted to have acute on chronic congestive heart failure.  1. Acute hypoxic respiratory failure-secondary to acute on chronic diastolic CHF exacerbation. -Echocardiogram with normal systolic function EF of 81-19% -Left pleural effusion noted. -Restarted Lasix as breathing was getting worse- lasix was held due to worsening renal function. Cr is stable at 1.59. -On 2-3 L oxygen now . -Wean as tolerated. -Still has some left  pleural effusion   2. Acute renal failure-some proteinuria on the urine analysis. Could be cardiorenal syndrome. -Lasix was held due to worsening renal failure. However appears more congested. -Restarted Lasix and creatinine is stable at 1.5. Will be discharged on once a day Lasix. Please check renal function as outpatient.  3. Elevated LFTs-could be hepatic congestion. Amiodarone dose also reduced to decrease the hepatotoxicity. -LFTs are improving  3. Atrial fibrillation-on amiodarone. On metoprolol for rate control -On eliquis for anticoagulation.  4. GERD-on Protonix twice a day  5. Mild dementia-had sundowning episodes in the hospital. Also some anxiety. Started on very low-dose Xanax as needed at bedtime.   Physical therapy recommended rehabilitation Will be discharged today    DISCHARGE CONDITIONS:   Guarded  CONSULTS OBTAINED:   Treatment Team:  Isaias Cowman, MD  DRUG ALLERGIES:   No Known Allergies DISCHARGE MEDICATIONS:   Allergies as of 07/30/2017   No Known Allergies     Medication List    STOP taking these medications   albuterol (2.5 MG/3ML) 0.083% nebulizer solution Commonly known as:  PROVENTIL   HYDROcodone-acetaminophen 5-325 MG tablet Commonly known as:  NORCO/VICODIN   torsemide 5 MG tablet Commonly known as:  DEMADEX     TAKE these medications   acetaminophen 325 MG tablet Commonly known as:  TYLENOL Take 2 tablets (650 mg total) by mouth every 6 (six) hours as needed for mild pain, moderate pain or fever (or Fever >/= 101).   ALPRAZolam 0.25 MG tablet Commonly known as:  XANAX Take 0.5 tablets (0.125 mg total) by mouth 2 (  two) times daily as needed for anxiety.   amiodarone 200 MG tablet Commonly known as:  PACERONE Take 1 tablet (200 mg total) by mouth daily. What changed:  when to take this   apixaban 5 MG Tabs tablet Commonly known as:  ELIQUIS Take 1 tablet (5 mg total) by mouth 2 (two) times daily.     bisacodyl 5 MG EC tablet Commonly known as:  DULCOLAX Take 1 tablet (5 mg total) by mouth daily as needed for moderate constipation.   docusate sodium 100 MG capsule Commonly known as:  COLACE Take 1 capsule (100 mg total) by mouth 2 (two) times daily.   feeding supplement (ENSURE ENLIVE) Liqd Take 237 mLs by mouth 2 (two) times daily between meals.   ferrous sulfate 325 (65 FE) MG tablet Take 1 tablet (325 mg total) by mouth 2 (two) times daily with a meal.   FLUoxetine 40 MG capsule Commonly known as:  PROZAC Take 1 capsule (40 mg total) by mouth daily.   furosemide 40 MG tablet Commonly known as:  LASIX Take 1 tablet (40 mg total) by mouth daily.   ipratropium-albuterol 0.5-2.5 (3) MG/3ML Soln Commonly known as:  DUONEB Take 3 mLs by nebulization every 6 (six) hours as needed.   lovastatin 20 MG tablet Commonly known as:  MEVACOR Take 1 tablet (20 mg total) by mouth daily.   magnesium oxide 400 (241.3 Mg) MG tablet Commonly known as:  MAG-OX Take 1 tablet (400 mg total) by mouth daily. Start taking on:  07/31/2017   metoprolol succinate 50 MG 24 hr tablet Commonly known as:  TOPROL-XL TAKE (1) TABLET BY MOUTH DAILY. TAKE WITH OR IMMEDIATELY FOLLOWING A MEAL   pantoprazole 40 MG tablet Commonly known as:  PROTONIX Take 1 tablet (40 mg total) by mouth 2 (two) times daily before a meal.   potassium chloride SA 20 MEQ tablet Commonly known as:  KLOR-CON M20 Take 1 tablet (20 mEq total) by mouth daily for 14 days. What changed:  when to take this   senna-docusate 8.6-50 MG tablet Commonly known as:  Senokot-S Take 1 tablet by mouth at bedtime as needed for mild constipation.   traZODone 100 MG tablet Commonly known as:  DESYREL TAKE 1 TABLET (100 MG TOTAL) BY MOUTH AT BEDTIME AS NEEDED FOR SLEEP.   Vitamin D-3 1000 units Caps Take 1 capsule by mouth daily.        DISCHARGE INSTRUCTIONS:   1. PCP follow-up in 1-2 weeks 2. Check metabolic panel in 1  week 2. Cardiology follow up in 2 weeks  DIET:   Dysphagia level 3 w/ MINCED meats and Gravy added to moisten; general aspiration precautions; monitoring at all meals. Pills given in Puree for safer swallowing. Encouragement and support at meals to eat/drink - reduce distractions at meals.   ACTIVITY:   Activity as tolerated  OXYGEN:   Home Oxygen: Yes.    Oxygen Delivery: 2 liters/min via Patient connected to nasal cannula oxygen  DISCHARGE LOCATION:   nursing home   If you experience worsening of your admission symptoms, develop shortness of breath, life threatening emergency, suicidal or homicidal thoughts you must seek medical attention immediately by calling 911 or calling your MD immediately  if symptoms less severe.  You Must read complete instructions/literature along with all the possible adverse reactions/side effects for all the Medicines you take and that have been prescribed to you. Take any new Medicines after you have completely understood and accpet all the possible  adverse reactions/side effects.   Please note  You were cared for by a hospitalist during your hospital stay. If you have any questions about your discharge medications or the care you received while you were in the hospital after you are discharged, you can call the unit and asked to speak with the hospitalist on call if the hospitalist that took care of you is not available. Once you are discharged, your primary care physician will handle any further medical issues. Please note that NO REFILLS for any discharge medications will be authorized once you are discharged, as it is imperative that you return to your primary care physician (or establish a relationship with a primary care physician if you do not have one) for your aftercare needs so that they can reassess your need for medications and monitor your lab values.    On the day of Discharge:  VITAL SIGNS:   Blood pressure 104/65, pulse 69, temperature  98 F (36.7 C), resp. rate 12, height 5\' 2"  (1.575 m), weight 78 kg (171 lb 14.4 oz), SpO2 94 %.  PHYSICAL EXAMINATION:    GENERAL:  83 y.o.-year-old patient lying in the bed with no acute distress.  EYES: Pupils equal, round, reactive to light and accommodation. No scleral icterus. Extraocular muscles intact.  HEENT: Head atraumatic, normocephalic. Oropharynx and nasopharynx clear.  NECK:  Supple, no jugular venous distention. No thyroid enlargement, no tenderness.  LUNGS: Normal breath sounds bilaterally, basilar rails. No wheezing. No use of accessory muscles for breathing CARDIOVASCULAR: S1, S2 normal. No  rubs, or gallops. 2/6 systolic murmur present ABDOMEN: Soft, nontender, nondistended. Bowel sounds present. No organomegaly or mass.  EXTREMITIES: No pedal edema, cyanosis, or clubbing.  NEUROLOGIC: Cranial nerves II through XII are intact. Muscle strength 5/5 in all extremities. Sensation intact. Gait not checked. Global weakness PSYCHIATRIC: The patient is alert and oriented x 2.  SKIN: No obvious rash, lesion, or ulcer.    DATA REVIEW:   CBC Recent Labs  Lab 07/26/17 0506  WBC 7.2  HGB 12.7  HCT 40.1  PLT 167    Chemistries  Recent Labs  Lab 07/30/17 0358  NA 138  K 3.8  CL 103  CO2 26  GLUCOSE 81  BUN 26*  CREATININE 1.59*  CALCIUM 9.0  MG 2.2  AST 92*  ALT 99*  ALKPHOS 159*  BILITOT 2.6*     Microbiology Results  Results for orders placed or performed during the hospital encounter of 05/19/17  Culture, blood (Routine X 2) w Reflex to ID Panel     Status: None   Collection Time: 05/20/17  2:55 AM  Result Value Ref Range Status   Specimen Description BLOOD LT HAND  Final   Special Requests   Final    BOTTLES DRAWN AEROBIC AND ANAEROBIC Blood Culture results may not be optimal due to an inadequate volume of blood received in culture bottles   Culture NO GROWTH 5 DAYS  Final   Report Status 05/25/2017 FINAL  Final  Culture, blood (Routine X 2) w  Reflex to ID Panel     Status: None   Collection Time: 05/20/17  2:57 AM  Result Value Ref Range Status   Specimen Description BLOOD RT ARM  Final   Special Requests   Final    BOTTLES DRAWN AEROBIC ONLY Blood Culture results may not be optimal due to an inadequate volume of blood received in culture bottles   Culture NO GROWTH 5 DAYS  Final   Report  Status 05/25/2017 FINAL  Final  MRSA PCR Screening     Status: None   Collection Time: 05/20/17  2:15 PM  Result Value Ref Range Status   MRSA by PCR NEGATIVE NEGATIVE Final    Comment:        The GeneXpert MRSA Assay (FDA approved for NASAL specimens only), is one component of a comprehensive MRSA colonization surveillance program. It is not intended to diagnose MRSA infection nor to guide or monitor treatment for MRSA infections.     RADIOLOGY:  Dg Chest 2 View  Result Date: 07/29/2017 CLINICAL DATA:  Progressive weakness, shortness of breath, and anorexia. History of CHF, diabetes, and recently admitted for pleural effusion and a electrolyte imbalance. EXAM: CHEST  2 VIEW COMPARISON:  Chest x-ray and chest CT scan of July 25, 2017 FINDINGS: Again demonstrated is abnormal soft tissue density in the right pleural space consistent with loculated pleural effusion. The right upper lobe is clear. On the left the interstitial markings are increased diffusely. The left hemidiaphragm remains obscured and somewhat more retrocardiac density is present. The cardiac silhouette remains enlarged. The ICD is in stable position. There is calcification in the wall of the aortic arch. IMPRESSION: Stable appearance of the right pleural space consistent with loculated pleural effusion. CHF with pulmonary interstitial edema greatest on the left. Probable left pleural effusion slightly more conspicuous today. Left basilar atelectasis or pneumonia is more conspicuous today Thoracic aortic atherosclerosis. Electronically Signed   By: David  Martinique M.D.   On:  07/29/2017 09:50     Management plans discussed with the patient, family and they are in agreement.  CODE STATUS:     Code Status Orders  (From admission, onward)        Start     Ordered   07/25/17 2130  Full code  Continuous     07/25/17 2129    Code Status History    Date Active Date Inactive Code Status Order ID Comments User Context   05/29/2017 17:11 05/30/2017 18:35 Full Code [id number]  Vaughan Basta, MD Inpatient   05/20/2017 02:50 05/24/2017 22:12 Full Code [id number]  Quintella Baton, MD Inpatient   04/18/2017 10:43 04/28/2017 20:09 Full Code [id number]  Demetrios Loll, MD Inpatient   01/27/2017 23:18 01/31/2017 14:31 Full Code [id number]  Harvie Bridge, DO Inpatient   12/08/2016 22:14 12/15/2016 15:09 Full Code [id number]  Demetrios Loll, MD ED      TOTAL TIME TAKING CARE OF THIS PATIENT: 38 minutes.    Gladstone Lighter M.D on 07/30/2017 at 9:07 AM  Between 7am to 6pm - Pager - [phone]  After 6pm go to [email or web address] - Proofreader  [facility]  Hospitalists  Office  [phone]  CC: Primary care physician; Einar Pheasant, MD   Note: This dictation was prepared with Dragon dictation along with smaller phrase technology. Any transcriptional errors that result from this process are unintentional.

## 2017-07-30 NOTE — Clinical Social Work Placement (Signed)
   CLINICAL SOCIAL WORK PLACEMENT  NOTE  Date:  07/30/2017  Patient Details  Name: Kristin Avila MRN: [id number] Date of Birth: Feb 06, 1928  Clinical Social Work is seeking post-discharge placement for this patient at the [place] level of care (*CSW will initial, date and re-position this form in  chart as items are completed):  Yes   Patient/family provided with [facility] Work Department's list of facilities offering this level of care within the geographic area requested by the patient (or if unable, by the patient's family).  Yes   Patient/family informed of their freedom to choose among providers that offer the needed level of care, that participate in Medicare, Medicaid or managed care program needed by the patient, have an available bed and are willing to accept the patient.  Yes   Patient/family informed of [facility]'s ownership interest in [facility] and [facility], as well as of the fact that they are under no obligation to receive care at these facilities.  PASRR submitted to EDS on 07/27/17     PASRR number received on       Existing PASRR number confirmed on 07/27/17     FL2 transmitted to all facilities in geographic area requested by pt/family on 07/27/17     FL2 transmitted to all facilities within larger geographic area on       Patient informed that his/her managed care company has contracts with or will negotiate with certain facilities, including the following:        Yes   Patient/family informed of bed offers received.  Patient chooses bed at [facility]     Physician recommends and patient chooses bed at      Patient to be transferred [facility] on 07/30/17.  Patient to be transferred to facility by [facility]     Patient family notified on 07/30/17 of transfer.  Name of family member notified:  Shanon Brow and Craig Guess, patient's sons were at bedside.      PHYSICIAN Please sign FL2     Additional Comment:    _______________________________________________ Ross Ludwig, LCSWA 07/30/2017, 12:49 PM

## 2017-08-04 ENCOUNTER — Encounter: Payer: Self-pay | Admitting: Emergency Medicine

## 2017-08-04 ENCOUNTER — Other Ambulatory Visit: Payer: Self-pay

## 2017-08-04 ENCOUNTER — Inpatient Hospital Stay
Admission: EM | Admit: 2017-08-04 | Discharge: 2017-08-10 | DRG: 871 | Disposition: E | Payer: Medicare Other | Attending: Internal Medicine | Admitting: Internal Medicine

## 2017-08-04 ENCOUNTER — Emergency Department: Payer: Medicare Other

## 2017-08-04 DIAGNOSIS — F419 Anxiety disorder, unspecified: Secondary | ICD-10-CM | POA: Diagnosis present

## 2017-08-04 DIAGNOSIS — N179 Acute kidney failure, unspecified: Secondary | ICD-10-CM | POA: Diagnosis present

## 2017-08-04 DIAGNOSIS — J9621 Acute and chronic respiratory failure with hypoxia: Secondary | ICD-10-CM | POA: Diagnosis present

## 2017-08-04 DIAGNOSIS — T68XXXA Hypothermia, initial encounter: Secondary | ICD-10-CM | POA: Diagnosis present

## 2017-08-04 DIAGNOSIS — A419 Sepsis, unspecified organism: Secondary | ICD-10-CM | POA: Diagnosis not present

## 2017-08-04 DIAGNOSIS — Z66 Do not resuscitate: Secondary | ICD-10-CM | POA: Diagnosis present

## 2017-08-04 DIAGNOSIS — N184 Chronic kidney disease, stage 4 (severe): Secondary | ICD-10-CM | POA: Diagnosis present

## 2017-08-04 DIAGNOSIS — G92 Toxic encephalopathy: Secondary | ICD-10-CM | POA: Diagnosis present

## 2017-08-04 DIAGNOSIS — I5032 Chronic diastolic (congestive) heart failure: Secondary | ICD-10-CM | POA: Diagnosis present

## 2017-08-04 DIAGNOSIS — Z7189 Other specified counseling: Secondary | ICD-10-CM | POA: Diagnosis not present

## 2017-08-04 DIAGNOSIS — E782 Mixed hyperlipidemia: Secondary | ICD-10-CM | POA: Diagnosis present

## 2017-08-04 DIAGNOSIS — Z7901 Long term (current) use of anticoagulants: Secondary | ICD-10-CM | POA: Diagnosis not present

## 2017-08-04 DIAGNOSIS — I4891 Unspecified atrial fibrillation: Secondary | ICD-10-CM | POA: Diagnosis present

## 2017-08-04 DIAGNOSIS — E872 Acidosis: Secondary | ICD-10-CM | POA: Diagnosis present

## 2017-08-04 DIAGNOSIS — E1122 Type 2 diabetes mellitus with diabetic chronic kidney disease: Secondary | ICD-10-CM | POA: Diagnosis present

## 2017-08-04 DIAGNOSIS — E43 Unspecified severe protein-calorie malnutrition: Secondary | ICD-10-CM

## 2017-08-04 DIAGNOSIS — Z6827 Body mass index (BMI) 27.0-27.9, adult: Secondary | ICD-10-CM

## 2017-08-04 DIAGNOSIS — I959 Hypotension, unspecified: Secondary | ICD-10-CM

## 2017-08-04 DIAGNOSIS — T4395XA Adverse effect of unspecified psychotropic drug, initial encounter: Secondary | ICD-10-CM | POA: Diagnosis present

## 2017-08-04 DIAGNOSIS — I509 Heart failure, unspecified: Secondary | ICD-10-CM | POA: Diagnosis not present

## 2017-08-04 DIAGNOSIS — F0390 Unspecified dementia without behavioral disturbance: Secondary | ICD-10-CM | POA: Diagnosis present

## 2017-08-04 DIAGNOSIS — Z85828 Personal history of other malignant neoplasm of skin: Secondary | ICD-10-CM

## 2017-08-04 DIAGNOSIS — R401 Stupor: Secondary | ICD-10-CM | POA: Diagnosis not present

## 2017-08-04 DIAGNOSIS — E860 Dehydration: Secondary | ICD-10-CM | POA: Diagnosis present

## 2017-08-04 DIAGNOSIS — R6521 Severe sepsis with septic shock: Secondary | ICD-10-CM | POA: Diagnosis present

## 2017-08-04 DIAGNOSIS — R0902 Hypoxemia: Secondary | ICD-10-CM

## 2017-08-04 DIAGNOSIS — J90 Pleural effusion, not elsewhere classified: Secondary | ICD-10-CM | POA: Diagnosis present

## 2017-08-04 DIAGNOSIS — Z95810 Presence of automatic (implantable) cardiac defibrillator: Secondary | ICD-10-CM | POA: Diagnosis not present

## 2017-08-04 DIAGNOSIS — Z515 Encounter for palliative care: Secondary | ICD-10-CM

## 2017-08-04 DIAGNOSIS — J9600 Acute respiratory failure, unspecified whether with hypoxia or hypercapnia: Secondary | ICD-10-CM

## 2017-08-04 DIAGNOSIS — I130 Hypertensive heart and chronic kidney disease with heart failure and stage 1 through stage 4 chronic kidney disease, or unspecified chronic kidney disease: Secondary | ICD-10-CM | POA: Diagnosis present

## 2017-08-04 DIAGNOSIS — I248 Other forms of acute ischemic heart disease: Secondary | ICD-10-CM | POA: Diagnosis present

## 2017-08-04 DIAGNOSIS — Z8249 Family history of ischemic heart disease and other diseases of the circulatory system: Secondary | ICD-10-CM

## 2017-08-04 DIAGNOSIS — R4182 Altered mental status, unspecified: Secondary | ICD-10-CM | POA: Diagnosis present

## 2017-08-04 LAB — LACTIC ACID, PLASMA: Lactic Acid, Venous: 3 mmol/L (ref 0.5–1.9)

## 2017-08-04 LAB — URINALYSIS, ROUTINE W REFLEX MICROSCOPIC
BILIRUBIN URINE: NEGATIVE
Glucose, UA: NEGATIVE mg/dL
HGB URINE DIPSTICK: NEGATIVE
Ketones, ur: NEGATIVE mg/dL
LEUKOCYTES UA: NEGATIVE
Nitrite: NEGATIVE
PROTEIN: 100 mg/dL — AB
Specific Gravity, Urine: 1.018 (ref 1.005–1.030)
pH: 5 (ref 5.0–8.0)

## 2017-08-04 LAB — BLOOD GAS, ARTERIAL
ACID-BASE EXCESS: 0.5 mmol/L (ref 0.0–2.0)
BICARBONATE: 22.6 mmol/L (ref 20.0–28.0)
FIO2: 0.28
O2 SAT: 94.5 %
PCO2 ART: 29 mmHg — AB (ref 32.0–48.0)
PO2 ART: 66 mmHg — AB (ref 83.0–108.0)
Patient temperature: 37
pH, Arterial: 7.5 — ABNORMAL HIGH (ref 7.350–7.450)

## 2017-08-04 LAB — COMPREHENSIVE METABOLIC PANEL
ALBUMIN: 2.5 g/dL — AB (ref 3.5–5.0)
ALK PHOS: 196 U/L — AB (ref 38–126)
ALT: 74 U/L — AB (ref 14–54)
AST: 59 U/L — AB (ref 15–41)
Anion gap: 10 (ref 5–15)
BILIRUBIN TOTAL: 2.1 mg/dL — AB (ref 0.3–1.2)
BUN: 49 mg/dL — AB (ref 6–20)
CO2: 25 mmol/L (ref 22–32)
CREATININE: 1.87 mg/dL — AB (ref 0.44–1.00)
Calcium: 9.1 mg/dL (ref 8.9–10.3)
Chloride: 104 mmol/L (ref 101–111)
GFR calc Af Amer: 27 mL/min — ABNORMAL LOW (ref >60)
GFR calc non Af Amer: 23 mL/min — ABNORMAL LOW (ref >60)
GLUCOSE: 134 mg/dL — AB (ref 65–99)
POTASSIUM: 4.5 mmol/L (ref 3.5–5.1)
Sodium: 139 mmol/L (ref 135–145)
TOTAL PROTEIN: 5.8 g/dL — AB (ref 6.5–8.1)

## 2017-08-04 LAB — CBC
HEMATOCRIT: 46.8 % (ref 35.0–47.0)
HEMOGLOBIN: 14.5 g/dL (ref 12.0–16.0)
MCH: 28.5 pg (ref 26.0–34.0)
MCHC: 31.1 g/dL — ABNORMAL LOW (ref 32.0–36.0)
MCV: 91.8 fL (ref 80.0–100.0)
Platelets: 132 10*3/uL — ABNORMAL LOW (ref 150–440)
RBC: 5.09 MIL/uL (ref 3.80–5.20)
RDW: 24.9 % — ABNORMAL HIGH (ref 11.5–14.5)
WBC: 9.4 10*3/uL (ref 3.6–11.0)

## 2017-08-04 LAB — PROCALCITONIN: PROCALCITONIN: 0.39 ng/mL

## 2017-08-04 LAB — TROPONIN I
Troponin I: 0.05 ng/mL (ref <0.03)
Troponin I: 0.06 ng/mL (ref <0.03)

## 2017-08-04 LAB — BRAIN NATRIURETIC PEPTIDE: B NATRIURETIC PEPTIDE 5: 1200 pg/mL — AB (ref 0.0–100.0)

## 2017-08-04 LAB — GLUCOSE, CAPILLARY: Glucose-Capillary: 109 mg/dL — ABNORMAL HIGH (ref 65–99)

## 2017-08-04 MED ORDER — IPRATROPIUM-ALBUTEROL 0.5-2.5 (3) MG/3ML IN SOLN: 3.0000 mL | Freq: Four times a day (QID) | RESPIRATORY_TRACT | Status: DC | PRN

## 2017-08-04 MED ORDER — ONDANSETRON HCL 4 MG/2ML IJ SOLN: 4.0000 mg | Freq: Four times a day (QID) | INTRAMUSCULAR | Status: DC | PRN

## 2017-08-04 MED ORDER — MAGNESIUM OXIDE 400 (241.3 MG) MG PO TABS: 400.0000 mg | ORAL_TABLET | Freq: Every day | ORAL | Status: DC

## 2017-08-04 MED ORDER — PRAVASTATIN SODIUM 20 MG PO TABS: 40.0000 mg | ORAL_TABLET | Freq: Every day | ORAL | Status: DC

## 2017-08-04 MED ORDER — BISACODYL 5 MG PO TBEC: 5.0000 mg | DELAYED_RELEASE_TABLET | Freq: Every day | ORAL | Status: DC | PRN

## 2017-08-04 MED ORDER — ACETAMINOPHEN 325 MG PO TABS: 650.0000 mg | ORAL_TABLET | Freq: Four times a day (QID) | ORAL | Status: DC | PRN

## 2017-08-04 MED ORDER — HEPARIN SODIUM (PORCINE) 5000 UNIT/ML IJ SOLN: 5000.0000 [IU] | Freq: Three times a day (TID) | INTRAMUSCULAR | Status: DC

## 2017-08-04 MED ORDER — POLYETHYLENE GLYCOL 3350 17 G PO PACK: 17.0000 g | PACK | Freq: Every day | ORAL | Status: DC | PRN

## 2017-08-04 MED ORDER — ENSURE ENLIVE PO LIQD: 237.0000 mL | Freq: Two times a day (BID) | ORAL | Status: DC

## 2017-08-04 MED ORDER — FUROSEMIDE 10 MG/ML IJ SOLN
60.0000 mg | Freq: Once | INTRAMUSCULAR | Status: AC
  Administered 2017-08-04: 60 mg via INTRAVENOUS
  Filled 2017-08-04: qty 8

## 2017-08-04 MED ORDER — DOCUSATE SODIUM 100 MG PO CAPS: 100.0000 mg | ORAL_CAPSULE | Freq: Two times a day (BID) | ORAL | Status: DC

## 2017-08-04 MED ORDER — AMIODARONE HCL 200 MG PO TABS: 200.0000 mg | ORAL_TABLET | Freq: Every day | ORAL | Status: DC

## 2017-08-04 MED ORDER — PANTOPRAZOLE SODIUM 40 MG PO TBEC
40.0000 mg | DELAYED_RELEASE_TABLET | Freq: Two times a day (BID) | ORAL | Status: DC
  Filled 2017-08-04: qty 1

## 2017-08-04 MED ORDER — ADULT MULTIVITAMIN W/MINERALS CH: 1.0000 | ORAL_TABLET | Freq: Every day | ORAL | Status: DC

## 2017-08-04 MED ORDER — SODIUM CHLORIDE 0.9 % IV BOLUS (SEPSIS)
500.0000 mL | Freq: Once | INTRAVENOUS | Status: AC
  Administered 2017-08-04: 500 mL via INTRAVENOUS

## 2017-08-04 MED ORDER — ONDANSETRON HCL 4 MG PO TABS: 4.0000 mg | ORAL_TABLET | Freq: Four times a day (QID) | ORAL | Status: DC | PRN

## 2017-08-04 MED ORDER — APIXABAN 5 MG PO TABS: 5.0000 mg | ORAL_TABLET | Freq: Two times a day (BID) | ORAL | Status: DC

## 2017-08-04 MED ORDER — DEXTROSE 5 % IV SOLN
2.0000 g | INTRAVENOUS | Status: DC
  Administered 2017-08-05: 2 g via INTRAVENOUS
  Filled 2017-08-04 (×2): qty 2

## 2017-08-04 MED ORDER — FERROUS SULFATE 325 (65 FE) MG PO TABS: 325.0000 mg | ORAL_TABLET | Freq: Two times a day (BID) | ORAL | Status: DC

## 2017-08-04 MED ORDER — ACETAMINOPHEN 650 MG RE SUPP
650.0000 mg | Freq: Four times a day (QID) | RECTAL | Status: DC | PRN
Start: 2017-08-04 — End: 2017-08-05

## 2017-08-04 MED ORDER — SODIUM CHLORIDE 0.9% FLUSH
3.0000 mL | Freq: Two times a day (BID) | INTRAVENOUS | Status: DC
  Administered 2017-08-05: 3 mL via INTRAVENOUS

## 2017-08-04 MED ORDER — SENNOSIDES-DOCUSATE SODIUM 8.6-50 MG PO TABS: 1.0000 | ORAL_TABLET | Freq: Every evening | ORAL | Status: DC | PRN

## 2017-08-04 MED ORDER — MEGESTROL ACETATE 400 MG/10ML PO SUSP
400.0000 mg | Freq: Two times a day (BID) | ORAL | Status: DC
  Filled 2017-08-04 (×2): qty 10

## 2017-08-04 MED ORDER — DEXTROSE-NACL 5-0.9 % IV SOLN: INTRAVENOUS | Status: DC

## 2017-08-04 MED ORDER — SODIUM CHLORIDE 0.9 % IV SOLN
0.0000 ug/min | INTRAVENOUS | Status: DC
  Filled 2017-08-04: qty 1

## 2017-08-04 NOTE — ED Notes (Signed)
Per admitting md, place warm blankets on pt. MD aware of current temp.

## 2017-08-04 NOTE — ED Notes (Addendum)
Spoke with house sup, new bed assignment due to new orders.

## 2017-08-04 NOTE — Consult Note (Signed)
Name: Kristin Avila MRN: [id number] DOB: 15-Nov-1927    ADMISSION DATE:  07/11/2017 CONSULTATION DATE: 07/23/2017  REFERRING MD : Dr. Jerelyn Charles   CHIEF COMPLAINT: Altered Mental Status and Abnormal Labs   BRIEF PATIENT DESCRIPTION:  83 yo female admitted 12/26 septic shock secondary to pneumonia, acute renal failure, and acute hypoxic respiratory failure secondary pulmonary edema and pneumonia   SIGNIFICANT EVENTS  12/26-Pt admitted to [facility] Unit   STUDIES:  None   HISTORY OF PRESENT ILLNESS:   This is an 83 yo female with a PMH of Syncope, Sick Sinus Syndrome s/p Pacemaker Placement 2018, Osteoarthritis, Dementia, HTN, Hyperlipidemia, Hypercholesteremia, Constipation, Basal Cell Carcinoma, Atrial Fibrillation, Anxiety, and Anemia.  She presented to [facility] ER 12/26 via EMS from [facility] with hypotension systolic bp 63'J, altered mental status, and acute renal failure with creatinine 1.8 and BUN 44.  In the ER she was found to be hypotensive, hypothermic temp 96 F, and hypoxic O2 sats in the 80's on 4L, therefore she was placed on NRB.  Lab results revealed BUN 49, creatinine 1.87, alk phos 196, ast 59, alt 74, bnp 1,200, lactic acid 3.0, initial trop 0.05, and PCT 0.39. CXR revealed vascular congestion, pulmonary edema, and small bilateral pleural effusions with loculated pleural fluid on the right.  She was subsequently admitted to the [facility] Unit by hospitalist team for further workup and treatment.    PAST MEDICAL HISTORY :   has a past medical history of Anemia, Anxiety (10/02/2012), Atrial fibrillation ([place]) (11/13), BCC (basal cell carcinoma), face (04/24/2016), Benign essential hypertension (03/13/2015), Cellulitis of right upper extremity (01/27/2017), Constipation (01/13/2015), Decreased hearing (05/21/2015), Health care maintenance (10/14/2014), Hypercholesterolemia, Hyperlipidemia, mixed (02/01/2017), Hypertension, Osteoarthritis, Sick sinus syndrome ([place]) (01/04/2017), and  Syncope (12/08/2016).  has a past surgical history that includes Tubal ligation; Appendectomy (1957); Kidney surgery (age 45); Cataract extraction (2013); Pacemaker insertion (Left, 12/14/2016); Incision and drainage abscess (Right, 01/29/2017); Video assisted thoracoscopy (vats)/thorocotomy (Right, 04/19/2017); Colonoscopy (09/06/2008); Esophagogastroduodenoscopy (egd) with propofol (N/A, 05/23/2017); and Colonoscopy with propofol (N/A, 05/23/2017). Prior to Admission medications   Medication Sig Start Date End Date Taking? Authorizing Provider  acetaminophen (TYLENOL) 325 MG tablet Take 2 tablets (650 mg total) by mouth every 6 (six) hours as needed for mild pain, moderate pain or fever (or Fever >/= 101). 12/11/16  Yes Gouru, Illene Silver, MD  ALPRAZolam Duanne Moron) 0.25 MG tablet Take 0.5 tablets (0.125 mg total) by mouth 2 (two) times daily as needed for anxiety. 07/30/17  Yes Gladstone Lighter, MD  amiodarone (PACERONE) 200 MG tablet Take 1 tablet (200 mg total) by mouth daily. 07/30/17  Yes Gladstone Lighter, MD  apixaban (ELIQUIS) 5 MG TABS tablet Take 1 tablet (5 mg total) by mouth 2 (two) times daily. 05/30/17  Yes Mody, Ulice Bold, MD  bisacodyl (DULCOLAX) 5 MG EC tablet Take 1 tablet (5 mg total) by mouth daily as needed for moderate constipation. 04/28/17  Yes Gouru, Illene Silver, MD  Cholecalciferol (VITAMIN D-3) 1000 UNITS CAPS Take 1 capsule by mouth daily.    Yes [provider]  docusate sodium (COLACE) 100 MG capsule Take 1 capsule (100 mg total) by mouth 2 (two) times daily. 07/30/17  Yes Gladstone Lighter, MD  feeding supplement, ENSURE ENLIVE, (ENSURE ENLIVE) LIQD Take 237 mLs by mouth 2 (two) times daily between meals. 07/30/17  Yes Gladstone Lighter, MD  ferrous sulfate 325 (65 FE) MG tablet Take 1 tablet (325 mg total) by mouth 2 (two) times daily with a meal. 05/30/17  Yes  Fritzi Mandes, MD  FLUoxetine (PROZAC) 40 MG capsule Take 1 capsule (40 mg total) by mouth daily. 01/18/17  Yes Einar Pheasant,  MD  furosemide (LASIX) 40 MG tablet Take 1 tablet (40 mg total) by mouth daily. Patient taking differently: Take 20 mg by mouth daily.  07/30/17 07/30/18 Yes Gladstone Lighter, MD  ipratropium-albuterol (DUONEB) 0.5-2.5 (3) MG/3ML SOLN Take 3 mLs by nebulization every 6 (six) hours as needed. 07/30/17  Yes Gladstone Lighter, MD  lovastatin (MEVACOR) 20 MG tablet Take 1 tablet (20 mg total) by mouth daily. 06/30/17  Yes Einar Pheasant, MD  magnesium oxide (MAG-OX) 400 (241.3 Mg) MG tablet Take 1 tablet (400 mg total) by mouth daily. 07/31/17  Yes Gladstone Lighter, MD  metoprolol succinate (TOPROL-XL) 50 MG 24 hr tablet TAKE (1) TABLET BY MOUTH DAILY. TAKE WITH OR IMMEDIATELY FOLLOWING A MEAL 01/19/17  Yes Einar Pheasant, MD  pantoprazole (PROTONIX) 40 MG tablet Take 1 tablet (40 mg total) by mouth 2 (two) times daily before a meal. 07/30/17  Yes Gladstone Lighter, MD  potassium chloride SA (KLOR-CON M20) 20 MEQ tablet Take 1 tablet (20 mEq total) by mouth daily for 14 days. 07/30/17 08/13/17 Yes Gladstone Lighter, MD  senna-docusate (SENOKOT-S) 8.6-50 MG tablet Take 1 tablet by mouth at bedtime as needed for mild constipation. 12/11/16  Yes Gouru, Illene Silver, MD  traZODone (DESYREL) 100 MG tablet TAKE 1 TABLET (100 MG TOTAL) BY MOUTH AT BEDTIME AS NEEDED FOR SLEEP. 06/25/17  Yes Einar Pheasant, MD   No Known Allergies  FAMILY HISTORY:  family history includes Alzheimer's disease in her unknown relative; Congestive Heart Failure in her father; Hypertension in her brother; Multiple myeloma in her mother. SOCIAL HISTORY:  reports that  has never smoked. she has never used smokeless tobacco. She reports that she does not drink alcohol or use drugs.  REVIEW OF SYSTEMS:   Unable to assess pt lethargic   SUBJECTIVE:  Unable to assess pt lethargic   VITAL SIGNS: Temp:  [95.5 F (35.3 C)-96 F (35.6 C)] 95.5 F (35.3 C) (12/26 2054) Pulse Rate:  [68-74] 69 (12/26 2230) Resp:  [17-36] 24 (12/26  2230) BP: (85-105)/(59-88) 100/77 (12/26 2230) SpO2:  [85 %-98 %] 98 % (12/26 2230) Weight:  [77.6 kg (171 lb)] 77.6 kg (171 lb) (12/26 1708)  PHYSICAL EXAMINATION: General: elderly female resting in bed, NAD  Neuro: lethargic alert to self, follows commands, PERRL  HEENT: supple, no JVD Cardiovascular: paced rhythm, no M Lungs: crackles throughout, slightly labored and tachypneic  Abdomen: +BS x4, soft, non tender, non distended  Musculoskeletal: normal bulk and tone, no edema  Skin: 2 abrasions present on buttocks, scattered ecchymosis   Recent Labs  Lab 07/29/17 0312 07/30/17 0358 07/26/2017 1708  NA 137 138 139  K 3.8 3.8 4.5  CL 103 103 104  CO2 26 26 25   BUN 26* 26* 49*  CREATININE 1.54* 1.59* 1.87*  GLUCOSE 87 81 134*   Recent Labs  Lab 07/13/2017 1708  HGB 14.5  HCT 46.8  WBC 9.4  PLT 132*   Dg Chest Portable 1 View  Result Date: 07/26/2017 CLINICAL DATA:  Hypotension EXAM: PORTABLE CHEST 1 VIEW COMPARISON:  07/29/2017, 07/25/2017 FINDINGS: Left-sided pacing device as before. Small bilateral pleural effusions with loculated pleural fluid on the right, similar compared to prior. Continued cardiomegaly with vascular congestion and mild interstitial edema. Aortic atherosclerosis. Dense consolidation at the left base. No pneumothorax. IMPRESSION: 1. No significant interval change in bilateral pleural effusions, loculated on  the right 2. Continued cardiomegaly with vascular congestion and pulmonary edema. Dense atelectasis or pneumonia at the left base also unchanged. Electronically Signed   By: Donavan Foil M.D.   On: 07/25/2017 17:29    ASSESSMENT / PLAN: Acute hypoxic respiratory failure secondary to pulmonary edema and pneumonia Bilateral pleural effusion loculated pleural fluid on right  Septic Shock secondary to pneumonia  Acute encephalopathy  Acute renal failure secondary to hypotension  Lactic Acidosis  Elevated troponin's likely secondary to demand ischemia    Hx: Atrial Fibrillation  P: Supplemental O2 to maintain O2 sats >92% Repeat CXR in am  Prn bronchodilator therapy  Prn neo-synephrine to maintain map >65 IV fluids discontinued  Trend WBC and monitor fever curve Trend PCT and lactic acid Influenza PCR results pending  Follow cultures Will start cefepime  Trend BMP Replace electrolytes as indicated  Monitor UOP  Eliquis for VTE prophylaxis Trend CBC Monitor for s/sx of bleeding  Transfuse per usual guidelines Continuous telemetry monitoring  Trend troponin's Prn bear hugger for hypothermia  Palliative Care Consult to discuss goals of treatment   Marda Stalker, [place] Pager [phone] (please enter 7 digits) PCCM Consult Pager [phone] (please enter 7 digits)

## 2017-08-04 NOTE — ED Notes (Signed)
O2 sat remaining in the 80s on 4L [place]. Patient placed on 15 non-rebreather. Oxygen saturation at 94%

## 2017-08-04 NOTE — H&P (Signed)
[facility] NAME: Kristin Avila    MR#:  [id number]  DATE OF BIRTH:  [birth year]  DATE OF ADMISSION:  07/16/2017  PRIMARY CARE PHYSICIAN: Einar Pheasant, MD   REQUESTING/REFERRING PHYSICIAN:   CHIEF COMPLAINT:   Chief Complaint  Patient presents with  . Abnormal Lab  . Altered Mental Status    HISTORY OF PRESENT ILLNESS: Kristin Avila  is a 83 y.o. female with a known history per below recently discharged from the hospital for congestive heart failure exacerbation discharged on Lasix, per ED attending patient was discharged on antibiotics for pneumonia-this did not happen, at the nursing home where patient was discharged she was noted to have elevated creatinine, noted confusion/disorientation-altered from her baseline, more confused, in the emergency room patient was hypotensive, noted O2 saturation in the 80s on 4 L-patient was placed on nonrebreather, ER workup noted for creatinine 1.8 with baseline between 1.3-1.5, BNP greater than 1200, troponin 0.05, CBC normal, chest x-ray noted for no change in pleural effusion/loculated on the right/cardiomegaly/vascular congestion/edema/left atelectasis versus pneumonia unchanged, patient evaluated the bedside in the emergency room, patient is a poor historian due to encephalopathy, multiple family members at the bedside/gust case with daughter on the phone, patient is now been admitted for acute dehydration, acute kidney injury most likely from medication side effect/poor appetite.  PAST MEDICAL HISTORY:   Past Medical History:  Diagnosis Date  . Anemia   . Anxiety 10/02/2012  . Atrial fibrillation ([place]) 11/13   new onset 11/13  . BCC (basal cell carcinoma), face 04/24/2016  . Benign essential hypertension 03/13/2015  . Cellulitis of right upper extremity 01/27/2017  . Constipation 01/13/2015  . Decreased hearing 05/21/2015  . Health care maintenance 10/14/2014   Mammogram 10/01/15 - Bir[facility] I.     . Hypercholesterolemia   . Hyperlipidemia, mixed 02/01/2017  . Hypertension   . Osteoarthritis    hands, feet  . Sick sinus syndrome ([place]) 01/04/2017   Overview:  Sp pacer placement 2018  . Syncope 12/08/2016    PAST SURGICAL HISTORY:  Past Surgical History:  Procedure Laterality Date  . APPENDECTOMY  1957  . CATARACT EXTRACTION  2013   left eye  . COLONOSCOPY  09/06/2008  . COLONOSCOPY WITH PROPOFOL N/A 05/23/2017   Procedure: COLONOSCOPY WITH PROPOFOL;  Surgeon: Lucilla Lame, MD;  Location: [facility] ENDOSCOPY;  Service: Endoscopy;  Laterality: N/A;  . ESOPHAGOGASTRODUODENOSCOPY (EGD) WITH PROPOFOL N/A 05/23/2017   Procedure: ESOPHAGOGASTRODUODENOSCOPY (EGD) WITH PROPOFOL;  Surgeon: Lucilla Lame, MD;  Location: [facility] ENDOSCOPY;  Service: Endoscopy;  Laterality: N/A;  . INCISION AND DRAINAGE ABSCESS Right 01/29/2017   Procedure: INCISION AND DRAINAGE ABSCESS;  Surgeon: Florene Glen, MD;  Location: [facility] ORS;  Service: General;  Laterality: Right;  . KIDNEY SURGERY  age 51  . PACEMAKER INSERTION Left 12/14/2016   Procedure: INSERTION PACEMAKER;  Surgeon: Isaias Cowman, MD;  Location: [facility] ORS;  Service: Cardiovascular;  Laterality: Left;  . TUBAL LIGATION    . VIDEO ASSISTED THORACOSCOPY (VATS)/THOROCOTOMY Right 04/19/2017   Procedure: RIGHT THORACOSCOPY AND EVACUATION OF HEMOTHORAX;  Surgeon: Nestor Lewandowsky, MD;  Location: [facility] ORS;  Service: General;  Laterality: Right;    SOCIAL HISTORY:  Social History   Tobacco Use  . Smoking status: Never Smoker  . Smokeless tobacco: Never Used  Substance Use Topics  . Alcohol use: No    Alcohol/week: 0.0 oz    Comment: occasional    FAMILY HISTORY:  Family History  Problem  Relation Age of Onset  . Congestive Heart Failure Father   . Multiple myeloma Mother   . Hypertension Brother   . Alzheimer's disease Unknown        grandmother and great aunts  . Breast cancer Neg Hx   . Colon cancer Neg Hx     DRUG ALLERGIES: No Known  Allergies  REVIEW OF SYSTEMS:  Unable to be obtained given poor historian/encephalopathy  MEDICATIONS AT HOME:  Prior to Admission medications   Medication Sig Start Date End Date Taking? Authorizing Provider  acetaminophen (TYLENOL) 325 MG tablet Take 2 tablets (650 mg total) by mouth every 6 (six) hours as needed for mild pain, moderate pain or fever (or Fever >/= 101). 12/11/16  Yes Gouru, Illene Silver, MD  ALPRAZolam Duanne Moron) 0.25 MG tablet Take 0.5 tablets (0.125 mg total) by mouth 2 (two) times daily as needed for anxiety. 07/30/17  Yes Gladstone Lighter, MD  amiodarone (PACERONE) 200 MG tablet Take 1 tablet (200 mg total) by mouth daily. 07/30/17  Yes Gladstone Lighter, MD  apixaban (ELIQUIS) 5 MG TABS tablet Take 1 tablet (5 mg total) by mouth 2 (two) times daily. 05/30/17  Yes Mody, Ulice Bold, MD  bisacodyl (DULCOLAX) 5 MG EC tablet Take 1 tablet (5 mg total) by mouth daily as needed for moderate constipation. 04/28/17  Yes Gouru, Illene Silver, MD  Cholecalciferol (VITAMIN D-3) 1000 UNITS CAPS Take 1 capsule by mouth daily.    Yes [provider]  docusate sodium (COLACE) 100 MG capsule Take 1 capsule (100 mg total) by mouth 2 (two) times daily. 07/30/17  Yes Gladstone Lighter, MD  feeding supplement, ENSURE ENLIVE, (ENSURE ENLIVE) LIQD Take 237 mLs by mouth 2 (two) times daily between meals. 07/30/17  Yes Gladstone Lighter, MD  ferrous sulfate 325 (65 FE) MG tablet Take 1 tablet (325 mg total) by mouth 2 (two) times daily with a meal. 05/30/17  Yes Fritzi Mandes, MD  FLUoxetine (PROZAC) 40 MG capsule Take 1 capsule (40 mg total) by mouth daily. 01/18/17  Yes Einar Pheasant, MD  furosemide (LASIX) 40 MG tablet Take 1 tablet (40 mg total) by mouth daily. Patient taking differently: Take 20 mg by mouth daily.  07/30/17 07/30/18 Yes Gladstone Lighter, MD  ipratropium-albuterol (DUONEB) 0.5-2.5 (3) MG/3ML SOLN Take 3 mLs by nebulization every 6 (six) hours as needed. 07/30/17  Yes Gladstone Lighter,  MD  lovastatin (MEVACOR) 20 MG tablet Take 1 tablet (20 mg total) by mouth daily. 06/30/17  Yes Einar Pheasant, MD  magnesium oxide (MAG-OX) 400 (241.3 Mg) MG tablet Take 1 tablet (400 mg total) by mouth daily. 07/31/17  Yes Gladstone Lighter, MD  metoprolol succinate (TOPROL-XL) 50 MG 24 hr tablet TAKE (1) TABLET BY MOUTH DAILY. TAKE WITH OR IMMEDIATELY FOLLOWING A MEAL 01/19/17  Yes Einar Pheasant, MD  pantoprazole (PROTONIX) 40 MG tablet Take 1 tablet (40 mg total) by mouth 2 (two) times daily before a meal. 07/30/17  Yes Gladstone Lighter, MD  potassium chloride SA (KLOR-CON M20) 20 MEQ tablet Take 1 tablet (20 mEq total) by mouth daily for 14 days. 07/30/17 08/13/17 Yes Gladstone Lighter, MD  senna-docusate (SENOKOT-S) 8.6-50 MG tablet Take 1 tablet by mouth at bedtime as needed for mild constipation. 12/11/16  Yes Gouru, Illene Silver, MD  traZODone (DESYREL) 100 MG tablet TAKE 1 TABLET (100 MG TOTAL) BY MOUTH AT BEDTIME AS NEEDED FOR SLEEP. 06/25/17  Yes Einar Pheasant, MD      PHYSICAL EXAMINATION:   VITAL SIGNS: Blood pressure (!) 87/61, pulse  68, temperature (!) 96 F (35.6 C), temperature source Rectal, resp. rate (!) 31, height 5' 2" (1.575 m), weight 77.6 kg (171 lb), SpO2 (!) 89 %.  GENERAL:  83 y.o.-year-old patient lying in the bed with no acute distress.  Frail-appearing EYES: Pupils equal, round, reactive to light and accommodation. No scleral icterus. Extraocular muscles intact.  HEENT: Head atraumatic, normocephalic. Oropharynx and nasopharynx clear.  NECK:  Supple, no jugular venous distention. No thyroid enlargement, no tenderness.  LUNGS: Severely diminished breath sounds with Rales/rhonchi at bases up to mid back. No use of accessory muscles of respiration-on nonrebreather  CARDIOVASCULAR: S1, S2 normal. No murmurs, rubs, or gallops.  ABDOMEN: Soft, nontender, nondistended. Bowel sounds present. No organomegaly or mass.  EXTREMITIES: Bilateral lower extremity edema, cyanosis,  or clubbing.  NEUROLOGIC: Cranial nerves II through XII are intact. MAES. Gait not checked.  PSYCHIATRIC: Lethargic, mild agitation, oriented x1-2   SKIN: No obvious rash, lesion, or ulcer.   LABORATORY PANEL:   CBC Recent Labs  Lab 07/24/2017 1708  WBC 9.4  HGB 14.5  HCT 46.8  PLT 132*  MCV 91.8  MCH 28.5  MCHC 31.1*  RDW 24.9*   ------------------------------------------------------------------------------------------------------------------  Chemistries  Recent Labs  Lab 07/29/17 0312 07/30/17 0358 07/22/2017 1708  NA 137 138 139  K 3.8 3.8 4.5  CL 103 103 104  CO2 _0 GLUCOSE 87 81 134*  BUN 26* 26* 49*  CREATININE 1.54* 1.59* 1.87*  CALCIUM 8.4* 9.0 9.1  MG 1.9 2.2  --   AST  --  92* 59*  ALT  --  99* 74*  ALKPHOS  --  159* 196*  BILITOT  --  2.6* 2.1*   ------------------------------------------------------------------------------------------------------------------ estimated creatinine clearance is 20.4 mL/min (A) (by C-G formula based on SCr of 1.87 mg/dL (H)). ------------------------------------------------------------------------------------------------------------------ No results for input(s): TSH, T4TOTAL, T3FREE, THYROIDAB in the last 72 hours.  Invalid input(s): FREET3   Coagulation profile No results for input(s): INR, PROTIME in the last 168 hours. ------------------------------------------------------------------------------------------------------------------- No results for input(s): DDIMER in the last 72 hours. -------------------------------------------------------------------------------------------------------------------  Cardiac Enzymes Recent Labs  Lab 08/09/2017 1708  TROPONINI 0.05*   ------------------------------------------------------------------------------------------------------------------ Invalid input(s):  POCBNP  ---------------------------------------------------------------------------------------------------------------  Urinalysis    Component Value Date/Time   COLORURINE AMBER (A) 07/10/2017 1758   APPEARANCEUR CLEAR (A) 07/16/2017 1758   APPEARANCEUR Clear 08/30/2012 0019   LABSPEC 1.018 07/23/2017 1758   LABSPEC 1.004 08/30/2012 0019   PHURINE 5.0 08/02/2017 1758   GLUCOSEU NEGATIVE 08/09/2017 1758   GLUCOSEU Negative 08/30/2012 0019   HGBUR NEGATIVE 07/21/2017 1758   BILIRUBINUR NEGATIVE 07/27/2017 1758   BILIRUBINUR Negative 08/30/2012 0019   KETONESUR NEGATIVE 07/15/2017 1758   PROTEINUR 100 (A) 07/15/2017 1758   NITRITE NEGATIVE 07/30/2017 1758   LEUKOCYTESUR NEGATIVE 07/21/2017 1758   LEUKOCYTESUR Negative 08/30/2012 0019     RADIOLOGY: Dg Chest Portable 1 View  Result Date: 07/24/2017 CLINICAL DATA:  Hypotension EXAM: PORTABLE CHEST 1 VIEW COMPARISON:  07/29/2017, 07/25/2017 FINDINGS: Left-sided pacing device as before. Small bilateral pleural effusions with loculated pleural fluid on the right, similar compared to prior. Continued cardiomegaly with vascular congestion and mild interstitial edema. Aortic atherosclerosis. Dense consolidation at the left base. No pneumothorax. IMPRESSION: 1. No significant interval change in bilateral pleural effusions, loculated on the right 2. Continued cardiomegaly with vascular congestion and pulmonary edema. Dense atelectasis or pneumonia at the left base also unchanged. Electronically Signed   By: Donavan Foil M.D.   On: 07/29/2017 17:29  EKG: Orders placed or performed during the hospital encounter of 07/25/17  . ED EKG  . ED EKG    IMPRESSION AND PLAN: 1 acute toxic metabolic encephalopathy Most likely secondary to medication side effect as well as acute dehydration Admit to regular nursing floor bed, hold psychotropic agents, neuro checks per routine, aspiration/fall precautions, head of bed at 45 degrees, check ammonia  level, RPR, IV fluids for rehydration  2 AKI w/ CKD Most likely secondary to medication side effect as well as poor p.o. Intake/appetite Hold Lasix, IV fluids for gentle rehydration, BMP daily, avoid nephrotoxic agents, strict I&O monitoring with daily weights  3 acute on chronic abnormal chest x-ray findings Noted for no change in bilateral pleural effusions-loculated on right Consult IR for thoracentesis evaluation  4 acute on chronic hypoxic respiratory failure Suspect related to underlying pleural effusions-for thoracentesis evaluation in the morning, continue nonrebreather for now, rule out acute coronary syndrome with cardiac enzymes x3 sets, pneumonia thought to be less likely-check pro-calcitonin level-was treated with course of antibiotics Rocephin/azithromycin last hospital admission from December 16 - December 21, will hold off on antibiotics at this time  5 chronic diastolic congestive heart failure without exacerbation Patient with normal ejection fraction Lasix/metoprolol on hold for relative hypotension, continue statin therapy, strict I&O monitoring, low-sodium/cardiac diet  6 acute hypotension with chronic benign essential hypertension  Hold antihypertensives as well as diuretics, gentle IV fluids for rehydration, rule out acute coronary syndrome as stated above  7 acute on chronic anorexia/poor appetite Megace twice daily, dietary consult for expert opinion  DNR status per family wishes Condition stable Prognosis poor DVT prophylaxis with heparin subcu Disposition pending clinical course   All the records are reviewed and case discussed with ED provider. Management plans discussed with the patient, family and they are in agreement.  CODE STATUS:    Code Status Orders  (From admission, onward)        Start     Ordered   07/29/2017 2025  Do not attempt resuscitation (DNR)  Continuous    Question Answer Comment  In the event of cardiac or respiratory ARREST Do  not call a "code blue"   In the event of cardiac or respiratory ARREST Do not perform Intubation, CPR, defibrillation or ACLS   In the event of cardiac or respiratory ARREST Use medication by any route, position, wound care, and other measures to relive pain and suffering. May use oxygen, suction and manual treatment of airway obstruction as needed for comfort.      07/29/2017 2024    Code Status History    Date Active Date Inactive Code Status Order ID Comments User Context   07/25/2017 21:29 07/30/2017 19:10 Full Code [id number]  Idelle Crouch, MD Inpatient   05/29/2017 17:11 05/30/2017 18:35 Full Code [id number]  Vaughan Basta, MD Inpatient   05/20/2017 02:50 05/24/2017 22:12 Full Code [id number]  Quintella Baton, MD Inpatient   04/18/2017 10:43 04/28/2017 20:09 Full Code [id number]  Demetrios Loll, MD Inpatient   01/27/2017 23:18 01/31/2017 14:31 Full Code [id number]  Harvie Bridge, DO Inpatient   12/08/2016 22:14 12/15/2016 15:09 Full Code [id number]  Demetrios Loll, MD ED       TOTAL TIME TAKING CARE OF THIS PATIENT: 45 minutes.    Avel Peace Salary M.D on 08/03/2017   Between 7am to 6pm - Pager - [phone]  After 6pm go to [email or web address] - Proofreader  [facility]  Office  [phone]  CC: Primary care physician; Nicki Reaper,  Randell Patient, MD   Note: This dictation was prepared with Dragon dictation along with smaller phrase technology. Any transcriptional errors that result from this process are unintentional.

## 2017-08-04 NOTE — ED Provider Notes (Signed)
[facility] Emergency Department Provider Note  Time seen: 5:00 PM  I have reviewed the triage vital signs and the nursing notes.   HISTORY  Chief Complaint Abnormal Lab and Altered Mental Status    HPI Kristin Avila is a 83 y.o. female with a past medical history of anemia, anxiety, hypertension, hyperlipidemia, dementia, presents to the emergency department with hypotension and abnormal lab work.  According to EMS the patient is new to [facility] as of 5 days ago.  States the patient was recently admitted to the hospital for pneumonia was discharged with antibiotics but they have not yet been able to get the antibiotics filled due to the holiday.  They state today they noted the patient to have a very low blood pressure around 68 systolic as well as labs today showing acute renal insufficiency with a creatinine of 1.8 and a BUN of 44.  They were concerned so they sent the patient to the emergency department via EMS for evaluation.  Here the patient is alert, will open her eyes when asked, is confused does not know where she is, why she is here or the date, but does know her name.  Per paperwork that came with the patient at baseline she is confused but able to follow simple commands.   Past Medical History:  Diagnosis Date  . Anemia   . Anxiety 10/02/2012  . Atrial fibrillation ([place]) 11/13   new onset 11/13  . BCC (basal cell carcinoma), face 04/24/2016  . Benign essential hypertension 03/13/2015  . Cellulitis of right upper extremity 01/27/2017  . Constipation 01/13/2015  . Decreased hearing 05/21/2015  . Health care maintenance 10/14/2014   Mammogram 10/01/15 - Birads I.    . Hypercholesterolemia   . Hyperlipidemia, mixed 02/01/2017  . Hypertension   . Osteoarthritis    hands, feet  . Sick sinus syndrome ([place]) 01/04/2017   Overview:  Sp pacer placement 2018  . Syncope 12/08/2016    Patient Active Problem List   Diagnosis Date Noted  . Malnutrition of  moderate degree 07/28/2017  . Acute respiratory distress 07/25/2017  . AKI (acute kidney injury) ([place]) 07/25/2017  . Sleeping difficulty 05/29/2017  . Symptomatic anemia 05/29/2017  . Iron deficiency anemia   . Iron deficiency anemia due to chronic blood loss   . DVT (deep venous thrombosis) ([place]) 05/20/2017  . Community acquired pneumonia 05/20/2017  . Encephalopathy acute 04/29/2017  . Pleural effusion on right 04/18/2017  . Hypotension 04/18/2017  . Hyperlipidemia, mixed 02/01/2017  . Cellulitis of right upper extremity 01/27/2017  . Sick sinus syndrome ([place]) 01/04/2017  . Syncope 12/08/2016  . BCC (basal cell carcinoma), face 04/24/2016  . Decreased hearing 05/21/2015  . Benign essential hypertension 03/13/2015  . Constipation 01/13/2015  . Health care maintenance 10/14/2014  . Anxiety 10/02/2012  . Atrial fibrillation ([place]) 07/09/2012  . Osteoarthritis 07/08/2012  . Hypercholesterolemia 07/08/2012  . Hypertension 07/08/2012    Past Surgical History:  Procedure Laterality Date  . APPENDECTOMY  1957  . CATARACT EXTRACTION  2013   left eye  . COLONOSCOPY  09/06/2008  . COLONOSCOPY WITH PROPOFOL N/A 05/23/2017   Procedure: COLONOSCOPY WITH PROPOFOL;  Surgeon: Lucilla Lame, MD;  Location: [facility] ENDOSCOPY;  Service: Endoscopy;  Laterality: N/A;  . ESOPHAGOGASTRODUODENOSCOPY (EGD) WITH PROPOFOL N/A 05/23/2017   Procedure: ESOPHAGOGASTRODUODENOSCOPY (EGD) WITH PROPOFOL;  Surgeon: Lucilla Lame, MD;  Location: [facility] ENDOSCOPY;  Service: Endoscopy;  Laterality: N/A;  . INCISION AND DRAINAGE ABSCESS Right 01/29/2017  Procedure: INCISION AND DRAINAGE ABSCESS;  Surgeon: Florene Glen, MD;  Location: [facility] ORS;  Service: General;  Laterality: Right;  . KIDNEY SURGERY  age 3  . PACEMAKER INSERTION Left 12/14/2016   Procedure: INSERTION PACEMAKER;  Surgeon: Isaias Cowman, MD;  Location: [facility] ORS;  Service: Cardiovascular;  Laterality: Left;  . TUBAL LIGATION    . VIDEO ASSISTED  THORACOSCOPY (VATS)/THOROCOTOMY Right 04/19/2017   Procedure: RIGHT THORACOSCOPY AND EVACUATION OF HEMOTHORAX;  Surgeon: Nestor Lewandowsky, MD;  Location: [facility] ORS;  Service: General;  Laterality: Right;    Prior to Admission medications   Medication Sig Start Date End Date Taking? Authorizing Provider  acetaminophen (TYLENOL) 325 MG tablet Take 2 tablets (650 mg total) by mouth every 6 (six) hours as needed for mild pain, moderate pain or fever (or Fever >/= 101). 12/11/16   Gouru, Illene Silver, MD  ALPRAZolam Duanne Moron) 0.25 MG tablet Take 0.5 tablets (0.125 mg total) by mouth 2 (two) times daily as needed for anxiety. 07/30/17   Gladstone Lighter, MD  amiodarone (PACERONE) 200 MG tablet Take 1 tablet (200 mg total) by mouth daily. 07/30/17   Gladstone Lighter, MD  apixaban (ELIQUIS) 5 MG TABS tablet Take 1 tablet (5 mg total) by mouth 2 (two) times daily. 05/30/17   Bettey Costa, MD  bisacodyl (DULCOLAX) 5 MG EC tablet Take 1 tablet (5 mg total) by mouth daily as needed for moderate constipation. 04/28/17   Nicholes Mango, MD  Cholecalciferol (VITAMIN D-3) 1000 UNITS CAPS Take 1 capsule by mouth daily.     [provider]  docusate sodium (COLACE) 100 MG capsule Take 1 capsule (100 mg total) by mouth 2 (two) times daily. 07/30/17   Gladstone Lighter, MD  feeding supplement, ENSURE ENLIVE, (ENSURE ENLIVE) LIQD Take 237 mLs by mouth 2 (two) times daily between meals. 07/30/17   Gladstone Lighter, MD  ferrous sulfate 325 (65 FE) MG tablet Take 1 tablet (325 mg total) by mouth 2 (two) times daily with a meal. 05/30/17   Fritzi Mandes, MD  FLUoxetine (PROZAC) 40 MG capsule Take 1 capsule (40 mg total) by mouth daily. 01/18/17   Einar Pheasant, MD  furosemide (LASIX) 40 MG tablet Take 1 tablet (40 mg total) by mouth daily. 07/30/17 07/30/18  Gladstone Lighter, MD  ipratropium-albuterol (DUONEB) 0.5-2.5 (3) MG/3ML SOLN Take 3 mLs by nebulization every 6 (six) hours as needed. 07/30/17   Gladstone Lighter, MD   lovastatin (MEVACOR) 20 MG tablet Take 1 tablet (20 mg total) by mouth daily. 06/30/17   Einar Pheasant, MD  magnesium oxide (MAG-OX) 400 (241.3 Mg) MG tablet Take 1 tablet (400 mg total) by mouth daily. 07/31/17   Gladstone Lighter, MD  metoprolol succinate (TOPROL-XL) 50 MG 24 hr tablet TAKE (1) TABLET BY MOUTH DAILY. TAKE WITH OR IMMEDIATELY FOLLOWING A MEAL 01/19/17   Einar Pheasant, MD  pantoprazole (PROTONIX) 40 MG tablet Take 1 tablet (40 mg total) by mouth 2 (two) times daily before a meal. 07/30/17   Gladstone Lighter, MD  potassium chloride SA (KLOR-CON M20) 20 MEQ tablet Take 1 tablet (20 mEq total) by mouth daily for 14 days. 07/30/17 08/13/17  Gladstone Lighter, MD  senna-docusate (SENOKOT-S) 8.6-50 MG tablet Take 1 tablet by mouth at bedtime as needed for mild constipation. 12/11/16   Nicholes Mango, MD  traZODone (DESYREL) 100 MG tablet TAKE 1 TABLET (100 MG TOTAL) BY MOUTH AT BEDTIME AS NEEDED FOR SLEEP. 06/25/17   Einar Pheasant, MD    No Known Allergies  Family  History  Problem Relation Age of Onset  . Congestive Heart Failure Father   . Multiple myeloma Mother   . Hypertension Brother   . Alzheimer's disease Unknown        grandmother and great aunts  . Breast cancer Neg Hx   . Colon cancer Neg Hx     Social History Social History   Tobacco Use  . Smoking status: Never Smoker  . Smokeless tobacco: Never Used  Substance Use Topics  . Alcohol use: No    Alcohol/week: 0.0 oz    Comment: occasional  . Drug use: No    Review of Systems Unable to obtain review of systems due to dementia/confusion at baseline.  ____________________________________________   PHYSICAL EXAM:  Constitutional: Patient is somnolent, but will open eyes when asked, will answer yes or no questions but often and accurately. Eyes: Normal exam ENT   Head: Normocephalic and atraumatic.   Mouth/Throat: Somewhat dry mucous membranes Cardiovascular: Normal rate, regular rhythm. No  murmur Respiratory: Normal respiratory effort without tachypnea nor retractions. Breath sounds are clear Gastrointestinal: Soft and nontender. No distention.  Musculoskeletal: Nontender with normal range of motion in all extremities. No lower extremity tenderness or edema.  Patient in pressure ulcer protective boots Neurologic:  Normal speech and language. No gross focal neurologic deficits Skin:  Skin is warm, dry and intact.  Psychiatric: Mood and affect are normal.  ____________________________________________    EKG  EKG reviewed and interpreted by myself shows an atrial ventricular dual paced rhythm at 84 bpm, widened QRS with nonspecific changes.  ____________________________________________    RADIOLOGY  IMPRESSION: 1. No significant interval change in bilateral pleural effusions, loculated on the right 2. Continued cardiomegaly with vascular congestion and pulmonary edema. Dense atelectasis or pneumonia at the left base also unchanged.  ____________________________________________   INITIAL IMPRESSION / ASSESSMENT AND PLAN / ED COURSE  Pertinent labs & imaging results that were available during my care of the patient were reviewed by me and considered in my medical decision making (see chart for details).  Patient presents to the emergency department for hypotension and abnormal lab work.  Differential would include dehydration, metabolic or left light abnormality, infectious etiology.  We will check labs, chest x-ray, dose IV fluids and continue to closely monitor in the emergency department.  I reviewed the patient's records including recent discharge summary 07/30/17 in which the patient was admitted for generalized weakness, acute on chronic congestive heart failure and acute kidney injury.  In reviewing the patient's records she is on Eliquis for atrial fibrillation as well as metoprolol, her creatinine during hospitalization was around 1.5.  Patient was to be  discharged on daily Lasix.  Confirms mild dementia at baseline.  I see no discharge instructions for antibiotics per record review.  Patient did not appear to have pneumonia either.  Patient's blood pressure upon discharge 104/65.  Currently the patient has an ER blood pressure in the low 37C systolic.  We will dose 500 mL of fluids given history of CHF.  Patient continues to have an increased oxygen requirement in the emergency department, now requiring nonrebreather to maintain in the low 90s.  I have confirmed with the family that she has a DO NOT RESUSCITATE and DO NOT INTUBATE order.  We will admit to the hospitalist service for further treatment.  ____________________________________________   FINAL CLINICAL IMPRESSION(S) / ED DIAGNOSES  Shortness of breath Hypoxia Hypotension    Harvest Dark, MD 07/27/2017 1906

## 2017-08-04 NOTE — Progress Notes (Signed)
Pharmacy Antibiotic Note  Kristin Avila is a 83 y.o. female admitted on 08/05/2017 with HCAP.  Pharmacy has been consulted for cefepime dosing.  Plan: Cefepime 2 grams q 24 hours ordered  Height: 5\' 2"  (157.5 cm) Weight: 171 lb (77.6 kg) IBW/kg (Calculated) : 50.1  Temp (24hrs), Avg:95.8 F (35.4 C), Min:95.5 F (35.3 C), Max:96 F (35.6 C)  Recent Labs  Lab 07/29/17 0312 07/30/17 0358 07/30/2017 1708 07/11/2017 2020  WBC  --   --  9.4  --   CREATININE 1.54* 1.59* 1.87*  --   LATICACIDVEN  --   --   --  3.0*    Estimated Creatinine Clearance: 20.4 mL/min (A) (by C-G formula based on SCr of 1.87 mg/dL (H)).    No Known Allergies  Antimicrobials this admission: Cefepime 12/27  >>    >>   Dose adjustments this admission:   Microbiology results: 12/26 BCx: pending 12/26 UCx: pending  12/26 MRSA PCR: pending      12/26 UA: (-) 12/26 CXR: Dense atelectasis or pneumonia at the left base also unchanged. Thank you for allowing pharmacy to be a part of this patient's care.  Laurelyn Terrero S 07/29/2017 11:26 PM

## 2017-08-04 NOTE — ED Triage Notes (Addendum)
Patient from [facility] for elevated creatinine and BUN. Patient is also altered from baseline per facility. Patient able to follow simple commands but alert to person only. Per EMS, patient was recently released from [facility] for pneumonia and was supposed to be started on antibiotics on Friday but had not yet started them. Patient with purple discoloration to bilateral hands and wrist. Patient moaning in discomfort but unable to localize pain. Patient hypotensive upon arrival. MD at bedside.

## 2017-08-05 DIAGNOSIS — R401 Stupor: Secondary | ICD-10-CM

## 2017-08-05 DIAGNOSIS — I959 Hypotension, unspecified: Secondary | ICD-10-CM

## 2017-08-05 DIAGNOSIS — Z515 Encounter for palliative care: Secondary | ICD-10-CM

## 2017-08-05 DIAGNOSIS — E43 Unspecified severe protein-calorie malnutrition: Secondary | ICD-10-CM

## 2017-08-05 DIAGNOSIS — T68XXXA Hypothermia, initial encounter: Secondary | ICD-10-CM

## 2017-08-05 DIAGNOSIS — I509 Heart failure, unspecified: Secondary | ICD-10-CM

## 2017-08-05 DIAGNOSIS — Z7189 Other specified counseling: Secondary | ICD-10-CM

## 2017-08-05 DIAGNOSIS — J90 Pleural effusion, not elsewhere classified: Secondary | ICD-10-CM

## 2017-08-05 LAB — INFLUENZA PANEL BY PCR (TYPE A & B)
INFLBPCR: NEGATIVE
Influenza A By PCR: NEGATIVE

## 2017-08-05 LAB — CBC
HCT: 45.3 % (ref 35.0–47.0)
HEMOGLOBIN: 14.2 g/dL (ref 12.0–16.0)
MCH: 28.6 pg (ref 26.0–34.0)
MCHC: 31.3 g/dL — ABNORMAL LOW (ref 32.0–36.0)
MCV: 91.2 fL (ref 80.0–100.0)
Platelets: 121 10*3/uL — ABNORMAL LOW (ref 150–440)
RBC: 4.97 MIL/uL (ref 3.80–5.20)
RDW: 25.1 % — ABNORMAL HIGH (ref 11.5–14.5)
WBC: 7.7 10*3/uL (ref 3.6–11.0)

## 2017-08-05 LAB — MRSA PCR SCREENING: MRSA BY PCR: NEGATIVE

## 2017-08-05 LAB — LACTIC ACID, PLASMA: Lactic Acid, Venous: 3.1 mmol/L (ref 0.5–1.9)

## 2017-08-05 LAB — BASIC METABOLIC PANEL
Anion gap: 7 (ref 5–15)
BUN: 48 mg/dL — AB (ref 6–20)
CALCIUM: 8.7 mg/dL — AB (ref 8.9–10.3)
CO2: 24 mmol/L (ref 22–32)
Chloride: 109 mmol/L (ref 101–111)
Creatinine, Ser: 1.69 mg/dL — ABNORMAL HIGH (ref 0.44–1.00)
GFR calc Af Amer: 30 mL/min — ABNORMAL LOW (ref >60)
GFR, EST NON AFRICAN AMERICAN: 26 mL/min — AB (ref >60)
GLUCOSE: 86 mg/dL (ref 65–99)
Potassium: 4.1 mmol/L (ref 3.5–5.1)
SODIUM: 140 mmol/L (ref 135–145)

## 2017-08-05 LAB — TROPONIN I
Troponin I: 0.06 ng/mL (ref <0.03)
Troponin I: 0.06 ng/mL (ref <0.03)

## 2017-08-05 MED ORDER — GLYCOPYRROLATE 0.2 MG/ML IJ SOLN
0.2000 mg | INTRAMUSCULAR | Status: DC | PRN
  Filled 2017-08-05: qty 1

## 2017-08-05 MED ORDER — SODIUM CHLORIDE 0.9% FLUSH
3.0000 mL | Freq: Two times a day (BID) | INTRAVENOUS | Status: DC
  Administered 2017-08-05: 22:00:00 3 mL via INTRAVENOUS

## 2017-08-05 MED ORDER — SODIUM CHLORIDE 0.9% FLUSH: 3.0000 mL | INTRAVENOUS | Status: DC | PRN

## 2017-08-05 MED ORDER — BIOTENE DRY MOUTH MT LIQD: 15.0000 mL | OROMUCOSAL | Status: DC | PRN

## 2017-08-05 MED ORDER — HALOPERIDOL LACTATE 5 MG/ML IJ SOLN: 0.5000 mg | INTRAMUSCULAR | Status: DC | PRN

## 2017-08-05 MED ORDER — SODIUM CHLORIDE 0.9 % IV SOLN
250.0000 mL | INTRAVENOUS | Status: DC | PRN
Start: 2017-08-05 — End: 2017-08-08

## 2017-08-05 MED ORDER — ACETAMINOPHEN 650 MG RE SUPP: 650.0000 mg | Freq: Four times a day (QID) | RECTAL | Status: DC | PRN

## 2017-08-05 MED ORDER — ONDANSETRON HCL 4 MG/2ML IJ SOLN: 4.0000 mg | Freq: Four times a day (QID) | INTRAMUSCULAR | Status: DC | PRN

## 2017-08-05 MED ORDER — LORAZEPAM 2 MG/ML IJ SOLN: 0.2500 mg | Freq: Two times a day (BID) | INTRAMUSCULAR | Status: DC | PRN

## 2017-08-05 MED ORDER — MORPHINE BOLUS VIA INFUSION
2.0000 mg | INTRAVENOUS | Status: DC | PRN
  Filled 2017-08-05: qty 2

## 2017-08-05 MED ORDER — PANTOPRAZOLE SODIUM 40 MG IV SOLR
40.0000 mg | INTRAVENOUS | Status: DC
  Administered 2017-08-05: 40 mg via INTRAVENOUS
  Filled 2017-08-05: qty 40

## 2017-08-05 MED ORDER — ACETAMINOPHEN 325 MG PO TABS: 650.0000 mg | ORAL_TABLET | Freq: Four times a day (QID) | ORAL | Status: DC | PRN

## 2017-08-05 MED ORDER — ORAL CARE MOUTH RINSE: 15.0000 mL | Freq: Two times a day (BID) | OROMUCOSAL | Status: DC

## 2017-08-05 MED ORDER — HALOPERIDOL 0.5 MG PO TABS
0.5000 mg | ORAL_TABLET | ORAL | Status: DC | PRN
  Filled 2017-08-05: qty 1

## 2017-08-05 MED ORDER — ONDANSETRON 4 MG PO TBDP
4.0000 mg | ORAL_TABLET | Freq: Four times a day (QID) | ORAL | Status: DC | PRN
  Filled 2017-08-05: qty 1

## 2017-08-05 MED ORDER — HALOPERIDOL LACTATE 2 MG/ML PO CONC
0.5000 mg | ORAL | Status: DC | PRN
  Filled 2017-08-05 (×2): qty 0.3

## 2017-08-05 MED ORDER — MORPHINE BOLUS VIA INFUSION
2.0000 mg | INTRAVENOUS | Status: DC | PRN
  Administered 2017-08-05 – 2017-08-06 (×4): 2 mg via INTRAVENOUS
  Filled 2017-08-05: qty 2

## 2017-08-05 MED ORDER — MORPHINE SULFATE (PF) 2 MG/ML IV SOLN
1.0000 mg | INTRAVENOUS | Status: DC | PRN
  Administered 2017-08-05: 2 mg via INTRAVENOUS
  Filled 2017-08-05 (×2): qty 1

## 2017-08-05 MED ORDER — MORPHINE SULFATE (PF) 2 MG/ML IV SOLN
1.0000 mg | INTRAVENOUS | Status: DC | PRN
  Administered 2017-08-05: 2 mg via INTRAVENOUS
  Filled 2017-08-05: qty 1

## 2017-08-05 MED ORDER — MORPHINE SULFATE (PF) 2 MG/ML IV SOLN
2.0000 mg | Freq: Once | INTRAVENOUS | Status: AC
  Administered 2017-08-05: 2 mg via INTRAVENOUS

## 2017-08-05 MED ORDER — POLYVINYL ALCOHOL 1.4 % OP SOLN
1.0000 [drp] | Freq: Four times a day (QID) | OPHTHALMIC | Status: DC | PRN
  Filled 2017-08-05: qty 15

## 2017-08-05 MED ORDER — SODIUM CHLORIDE 0.9 % IV SOLN
1.0000 mg/h | INTRAVENOUS | Status: DC
  Administered 2017-08-05: 1 mg/h via INTRAVENOUS
  Filled 2017-08-05: qty 10

## 2017-08-05 MED ORDER — LACTATED RINGERS IV SOLN
INTRAVENOUS | Status: DC
  Administered 2017-08-05: 11:00:00 via INTRAVENOUS

## 2017-08-05 MED ORDER — GLYCOPYRROLATE 1 MG PO TABS
1.0000 mg | ORAL_TABLET | ORAL | Status: DC | PRN
  Filled 2017-08-05: qty 1

## 2017-08-05 MED ORDER — HEPARIN SODIUM (PORCINE) 5000 UNIT/ML IJ SOLN
5000.0000 [IU] | Freq: Two times a day (BID) | INTRAMUSCULAR | Status: DC
  Administered 2017-08-05: 5000 [IU] via SUBCUTANEOUS
  Filled 2017-08-05: qty 1

## 2017-08-05 MED ORDER — LORAZEPAM 2 MG/ML IJ SOLN: 0.5000 mg | Freq: Four times a day (QID) | INTRAMUSCULAR | Status: DC | PRN

## 2017-08-05 MED ORDER — GLYCOPYRROLATE 0.2 MG/ML IJ SOLN
0.4000 mg | Freq: Three times a day (TID) | INTRAMUSCULAR | Status: DC | PRN
  Filled 2017-08-05: qty 2

## 2017-08-05 MED ORDER — CHLORHEXIDINE GLUCONATE 0.12 % MT SOLN
15.0000 mL | Freq: Two times a day (BID) | OROMUCOSAL | Status: DC
  Administered 2017-08-05: 15 mL via OROMUCOSAL

## 2017-08-05 NOTE — Clinical Social Work Note (Signed)
Clinical Social Work Assessment  Patient Details  Name: Kristin Avila MRN: [id number] Date of Birth: June 03, 1928  Date of referral:  08/05/17               Reason for consult:  Discharge Planning, End of Life/Hospice, Other (Comment Required)(From [facility]. )                Permission sought to share information with:    Permission granted to share information::     Name::        Agency::     Relationship::     Contact Information:     Housing/Transportation Living arrangements for the past 2 months:  [place], [place] of Information:  Adult Children, Facility Patient Interpreter Needed:  None Criminal Activity/Legal Involvement Pertinent to Current Situation/Hospitalization:  No - Comment as needed Significant Relationships:  Adult Children Lives with:  Self Do you feel safe going back to the place where you live?  Yes Need for family participation in patient care:  Yes (Comment)  Care giving concerns:  Patient is a short term rehab resident at [facility].    Social Worker assessment / plan:  Holiday representative (CSW) received consult that patient is from [facility]. Per Neoma Laming admissions coordinator at [facility] patient can return for rehab if needed however patient's family has picked up her belongings from [place]. CSW contacted patient's son Kristin Avila and spoke to him and her daughter Kristin Avila via telephone. Per Kristin Avila and Kristin Avila patient has been made comfort care and will likely pass away in the hospital. Per Kristin Avila they have already met with palliative and the plan is for patient to remain at [facility] under comfort care. CSW provided emotional support and will continue to follow and assist as needed.   Employment status:  Retired Nurse, adult PT Recommendations:  Not assessed at this time Information / Referral to community resources:  Other (Comment Required)(expected hospital death.  )  Patient/Family's Response to care:  Patient's family is agreeable to comfort care.   Patient/Family's Understanding of and Emotional Response to Diagnosis, Current Treatment, and Prognosis:  Patient's family understands that patient has a poor prognosis and is going to pass away soon.   Emotional Assessment Appearance:  Appears stated age Attitude/Demeanor/Rapport:  Unable to Assess Affect (typically observed):  Unable to Assess Orientation:  Oriented to Self, Fluctuating Orientation (Suspected and/or reported Sundowners) Alcohol / Substance use:  Not Applicable Psych involvement (Current and /or in the community):  No (Comment)  Discharge Needs  Concerns to be addressed:  Discharge Planning Concerns Readmission within the last 30 days:  No Current discharge risk:  Dependent with Mobility, Cognitively Impaired, Chronically ill, Terminally ill Barriers to Discharge:  Continued Medical Work up   [employer], Veronia Beets, LCSW 08/05/2017, 4:34 PM

## 2017-08-05 NOTE — Consult Note (Signed)
Consultation Note Date: 08/05/2017   Patient Name: Kristin Avila  DOB: 04-02-1928  MRN: [id number]  Age / Sex: 83 y.o., female  PCP: Einar Pheasant, MD Referring Physician: Demetrios Loll, MD  Reason for Consultation: Establishing goals of care  HPI/Patient Profile: 83 y.o. female with past medical history of CHF with recurrent right pleural effusion, afib, osteo arthritis, and sick sinus syndrome s/p pace maker placement, who was admitted on 07/22/2017 with altered mental status.  She was found to have acute kidney injury, hypotension, and hypothermia.  Her lactic acid was 3.1.  She has received treatment in the ICU.  Clinical Assessment and Goals of Care:  I have reviewed medical records including EPIC notes, labs and imaging, received report from the bedside RN, assessed the patient and then met at the bedside along with her daughter Arbie Cookey and son Josph Macho to discuss diagnosis prognosis, GOC, EOL wishes, disposition and options.  I introduced Palliative Medicine as specialized medical care for people living with serious illness. It focuses on providing relief from the symptoms and stress of a serious illness. The goal is to improve quality of life for both the patient and the family.  We discussed a brief life review of the patient. She had 4 children, one of which passed away 5-6 years ago.  She was always very active.  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  She is a religious person and regularly attended services.  She has had a rapid decline recently - 8 hospital admissions since May.  The family states that after her last admission she really did not bounce back at all.  Per her daughter she has only eating 20 bites since December 12th.  We  discussed her current illness and what it means in the larger context of her on-going co-morbidities.  Natural disease trajectory and expectations at EOL were discussed.  I clearly expressed to the family that I thought Mrs. Boyar was dying.  That news seemed to hit the family hard, but they accepted and agreed with it.  Fred's wife is an Therapist, sports who worked for [employer]) for many years.  Both Josph Macho and Arbie Cookey are familiar with [facility] (their sister passed away there).  They clearly do not want their mother to return to [facility] (or SNF).  They are unable to care for her at home.    We discussed the possibility of her passing away in the hospital (if she continues her rapid decline) vs being transferred to hospice house.  Josph Macho and Arbie Cookey are in favor of a shift towards full comfort, but they would like to speak with their brother "Audrie Lia" before making a final decision.   They plan to talk with Audrie Lia later today.  In the mean time they would support of treatment with gentle IVF  and antibiotics.  They made it clear that they want their mother to be comfortable.  They appreciate treatment with morphine when she is uncomfortable. They asked that she not have any more masks on her face and that nothing be put on her feet (she doesn't like socks or anything on her feet.  We decided to talk again after they speak with Audrie Lia or at latest tomorrow morning.  The family was encouraged to call with questions in the mean time.  Primary Decision Maker:  NEXT OF KIN Children Arbie Cookey, Ace Gins)    SUMMARY OF RECOMMENDATIONS    Will d/c ferrous sulfate and several other oral meds (patient appears too lethargic to take meds.  Please do not put socks or SCDs on patient.  Per family she does not like to be "bound up".  She likes her arms and feet free.  No further masks - NRB, Bipap, Cpap.  Will increase frequency of PRN morphine.  Will reassess later  today or tomorrow am to determine move to "full comfort" and either Hospital death or transfer to [facility].  Code Status/Advance Care Planning:  DNR   Symptom Management:   Morphine.  Several maintenance meds were d/c'd  Will add other comfort PRNs (ativan, robinul)   Psycho-social/Spiritual:   Desire for further Chaplaincy support: yes  Prognosis:  Hours to days.   Discharge Planning:  Hospital death anticipated.   Hopeful for transport to [facility] if the family opts for full comfort and the patient is stable enough in the next day or so      Primary Diagnoses: Present on Admission: . Change in mental status . Hypothermia   I have reviewed the medical record, interviewed the patient and family, and examined the patient. The following aspects are pertinent.  Past Medical History:  Diagnosis Date  . Anemia   . Anxiety 10/02/2012  . Atrial fibrillation ([place]) 11/13   new onset 11/13  . BCC (basal cell carcinoma), face 04/24/2016  . Benign essential hypertension 03/13/2015  . Cellulitis of right upper extremity 01/27/2017  . Constipation 01/13/2015  . Decreased hearing 05/21/2015  . Health care maintenance 10/14/2014   Mammogram 10/01/15 - Birads I.    . Hypercholesterolemia   . Hyperlipidemia, mixed 02/01/2017  . Hypertension   . Osteoarthritis    hands, feet  . Sick sinus syndrome ([place]) 01/04/2017   Overview:  Sp pacer placement 2018  . Syncope 12/08/2016   Social History   Socioeconomic History  . Marital status: Widowed    Spouse name: None  . Number of children: 4  . Years of education: None  . Highest education level: None  Social Needs  . Financial resource strain: None  . Food insecurity - worry: None  . Food insecurity - inability: None  . Transportation needs - medical: None  . Transportation needs - non-medical: None  Occupational History  . None  Tobacco Use  . Smoking status: Never Smoker  . Smokeless tobacco: Never Used  Substance and Sexual  Activity  . Alcohol use: No    Alcohol/week: 0.0 oz    Comment: occasional  . Drug use: No  . Sexual activity: No  Other Topics Concern  . None  Social History Narrative   Widowed   4 children   Never smoker   Occasionally drinks   Full Code   Family History  Problem Relation Age of Onset  . Congestive Heart Failure Father   . Multiple myeloma Mother   .  Hypertension Brother   . Alzheimer's disease Unknown        grandmother and great aunts  . Breast cancer Neg Hx   . Colon cancer Neg Hx    Scheduled Meds: . amiodarone  200 mg Oral Daily  . chlorhexidine  15 mL Mouth Rinse BID  . docusate sodium  100 mg Oral BID  . heparin injection (subcutaneous)  5,000 Units Subcutaneous Q12H  . mouth rinse  15 mL Mouth Rinse q12n4p  . pantoprazole (PROTONIX) IV  40 mg Intravenous Q24H  . sodium chloride flush  3 mL Intravenous Q12H   Continuous Infusions: . ceFEPime (MAXIPIME) IV Stopped (08/05/17 0146)  . lactated ringers 50 mL/hr at 08/05/17 1057   PRN Meds:.acetaminophen **OR** acetaminophen, bisacodyl, ipratropium-albuterol, morphine injection, [DISCONTINUED] ondansetron **OR** ondansetron (ZOFRAN) IV, polyethylene glycol No Known Allergies Review of Systems patient is non-responsive  Physical Exam  Well developed female, non responsive, poor color, in ICU CV irreg, rate controlled Resp on 4L, no distress, decreased breath sounds Abdomen soft, nd, nt  Vital Signs: BP (!) 84/51   Pulse 70   Temp (!) 97.2 F (36.2 C) (Axillary)   Resp 20   Ht 5' 6"  (1.676 m)   Wt 76.5 kg (168 lb 10.4 oz)   SpO2 98%   BMI 27.22 kg/m  Pain Assessment: PAINAD POSS *See Group Information*: S-Acceptable,Sleep, easy to arouse Pain Score: Asleep   SpO2: SpO2: 98 % O2 Device:SpO2: 98 % O2 Flow Rate: .O2 Flow Rate (L/min): 4 L/min  IO: Intake/output summary:   Intake/Output Summary (Last 24 hours) at 08/05/2017 1204 Last data filed at 08/05/2017 1057 Gross per 24 hour  Intake 550 ml   Output 425 ml  Net 125 ml    LBM: Last BM Date: 08/05/17 Baseline Weight: Weight: 77.6 kg (171 lb) Most recent weight: Weight: 76.5 kg (168 lb 10.4 oz)     Palliative Assessment/Data: 10%     Time In: 11:00 Time Out: 12:10 Time Total: 70 min. Greater than 50%  of this time was spent counseling and coordinating care related to the above assessment and plan.  Signed by: Florentina Jenny, PA-C Palliative Medicine Pager: [phone]  Please contact Palliative Medicine Team phone at [phone] for questions and concerns.  For individual provider: See Shea Evans

## 2017-08-05 NOTE — Progress Notes (Signed)
I saw patient earlier in day and discussed goals of care with patient's daughter. I requested Palliative Care consultation and they have met with family. We have now tranitioned to a focus on comfort. I have stopped all unnecessary labs and studies. We will transfer to [place] floor (oncology floor). Family is satisfied with care she has received and all are in agreement with decisions made.   After transfer, PCCM will sign off. Please call if we can be of further assistance   Merton Border, MD PCCM service Mobile [phone] Pager [phone] 08/05/2017 2:35 PM

## 2017-08-05 NOTE — Progress Notes (Signed)
Initial Nutrition Assessment  DOCUMENTATION CODES:   Severe malnutrition in context of chronic illness  INTERVENTION:  Discontinued Ensure Enlive.  No appropriate nutrition interventions at this time as patient is NPO and unsafe for PO intake. Patient also not appropriate for nutrition support. Prognosis per PMT is hours to days with hospital death anticipated.  Will continue to monitor discussions regarding goals of care.  NUTRITION DIAGNOSIS:   Severe Malnutrition related to chronic illness(CHF, advanced age) as evidenced by moderate fat depletion, severe fat depletion, moderate muscle depletion, severe muscle depletion.  GOAL:   Other (Comment)(Comfort)  MONITOR:   PO intake, Labs, Weight trends, Skin, I & O's  REASON FOR ASSESSMENT:   Consult Assessment of nutrition requirement/status  ASSESSMENT:   83 year old female with PMHx of HTN, OA, A-fib, anxiety, HLD who is now admitted with septic shock, PNA, ARF, acute hypoxic respiratory failure secondary to pulmonary edema and PNA.   -PMT met with patient's family today following RD assessment. Plan is for no further masks (NRB, BiPAP, CPAP), no socks or SCDs. Family is discussing transition to full comfort care later today or tomorrow.  Met with patient's daughter at bedside. She reports patient has declined rapidly since 12/11. She reports patient has had maybe 20 bites since then. She was eating prior to that, but was still having very small amounts at meals. Prior to the last few weeks she never had any issues with chewing/swallowing. We discussed that she is now unsafe for PO intake and that is why she has been made NPO. Daughter reports that patient used to work as a Catering manager and loved cooking food both at home and at work.  Daughter reports her UBW was 160 lbs. She reports that about 3 years ago she was on a diet and had lost down to 145 lbs, but had gained back to her UBW. She reports with how little she  has been eating she should have lost weight, but the fluid is masking weight loss.  Medications reviewed and include: Colace, pantoprazole, cefepime, LR @ 50 mL/hr.  Labs reviewed: BUN 48, Creatinine 1.69.  Discussed with RN.  NUTRITION - FOCUSED PHYSICAL EXAM:    Most Recent Value  Orbital Region  Severe depletion  Upper Arm Region  Moderate depletion  Thoracic and Lumbar Region  Severe depletion  Buccal Region  Severe depletion  Temple Region  Severe depletion  Clavicle Bone Region  Severe depletion  Clavicle and Acromion Bone Region  Severe depletion  Scapular Bone Region  Severe depletion  Dorsal Hand  Moderate depletion  Patellar Region  Unable to assess  Anterior Thigh Region  Unable to assess  Posterior Calf Region  Unable to assess  Edema (RD Assessment)  Moderate  Hair  Reviewed  Eyes  Unable to assess  Mouth  Unable to assess  Skin  Reviewed [ecchymosis]  Nails  Reviewed     Diet Order:  No diet orders on file  EDUCATION NEEDS:   Not appropriate for education at this time  Skin:  Skin Assessment: Skin Integrity Issues: Skin Integrity Issues:: Other (Comment) Other: MSAD to groin; ecchymosis to right arm/hand  Last BM:  08/05/2017  Height:   Ht Readings from Last 1 Encounters:  08/05/17 5' 6"  (1.676 m)    Weight:   Wt Readings from Last 1 Encounters:  08/05/17 168 lb 10.4 oz (76.5 kg)    Ideal Body Weight:  59.1 kg  BMI:  Body mass index is 27.22 kg/m.  Estimated Nutritional Needs:   Kcal:  1470-1710 (MSJ x 1.2-1.4)  Protein:  75-90 grams (1-1.2 grams/kg)  Fluid:  1.4-1.7 L/day (1 mL/kcal)  Willey Blade, MS, RD, LDN Office: [phone] Pager: [phone] After Hours/Weekend Pager: [phone]

## 2017-08-05 NOTE — Progress Notes (Signed)
Talked with family.  They have opted for full comfort.  Patient will most likely die in the hospital.   Does not appear stable for transport at this time.  Florentina Jenny, PA-C Palliative Medicine Pager: [phone]

## 2017-08-05 NOTE — NC FL2 (Signed)
[place] LEVEL OF CARE SCREENING TOOL     IDENTIFICATION  Patient Name: Kristin Avila Birthdate: 02/22/1928 Sex: female Admission Date (Current Location): 08/05/2017  [place] and [place] Number:  Engineering geologist and Address:  [facility], [address], [place], [place] [address]      Provider Number: [id number]  Attending Physician Name and Address:  Demetrios Loll, MD  Relative Name and Phone Number:       Current Level of Care: Hospital Recommended Level of Care: [place] Prior Approval Number:    Date Approved/Denied:   PASRR Number: ([id number] )  Discharge Plan: SNF    Current Diagnoses: Patient Active Problem List   Diagnosis Date Noted  . Protein-calorie malnutrition, severe 08/05/2017  . Acute on chronic congestive heart failure ([place])   . Pleural effusion   . Palliative care encounter   . Goals of care, counseling/discussion   . Encounter for hospice care discussion   . Change in mental status 07/23/2017  . Hypothermia 08/03/2017  . Malnutrition of moderate degree 07/28/2017  . Acute respiratory distress 07/25/2017  . AKI (acute kidney injury) ([place]) 07/25/2017  . Sleeping difficulty 05/29/2017  . Symptomatic anemia 05/29/2017  . Iron deficiency anemia   . Iron deficiency anemia due to chronic blood loss   . DVT (deep venous thrombosis) ([place]) 05/20/2017  . Community acquired pneumonia 05/20/2017  . Encephalopathy acute 04/29/2017  . Pleural effusion on right 04/18/2017  . Hypotension 04/18/2017  . Hyperlipidemia, mixed 02/01/2017  . Cellulitis of right upper extremity 01/27/2017  . Sick sinus syndrome ([place]) 01/04/2017  . Syncope 12/08/2016  . BCC (basal cell carcinoma), face 04/24/2016  . Decreased hearing 05/21/2015  . Benign essential hypertension 03/13/2015  . Constipation 01/13/2015  . Health care maintenance 10/14/2014  . Anxiety 10/02/2012  . Atrial fibrillation ([place]) 07/09/2012   . Osteoarthritis 07/08/2012  . Hypercholesterolemia 07/08/2012  . Hypertension 07/08/2012    Orientation RESPIRATION BLADDER Height & Weight     Self  O2(2 Liters Oxygen. ) Continent Weight: 168 lb 10.4 oz (76.5 kg) Height:  5\' 6"  (167.6 cm)  BEHAVIORAL SYMPTOMS/MOOD NEUROLOGICAL BOWEL NUTRITION STATUS      Continent Diet(Regular Diet. )  AMBULATORY STATUS COMMUNICATION OF NEEDS Skin   Extensive Assist Verbally Normal                       Personal Care Assistance Level of Assistance  Bathing, Feeding, Dressing Bathing Assistance: Limited assistance Feeding assistance: Independent Dressing Assistance: Limited assistance     Functional Limitations Info  Sight, Hearing, Speech Sight Info: Adequate Hearing Info: Impaired Speech Info: Adequate    SPECIAL CARE FACTORS FREQUENCY  PT (By licensed PT), OT (By licensed OT)     PT Frequency: (5) OT Frequency: (5)            Contractures      Additional Factors Info  Code Status, Allergies Code Status Info: (DNR ) Allergies Info: (No Known Allergies. )           Current Medications (08/05/2017):  This is the current hospital active medication list Current Facility-Administered Medications  Medication Dose Route Frequency Provider Last Rate Last Dose  . acetaminophen (TYLENOL) tablet 650 mg  650 mg Oral Q6H PRN Salary, Montell D, MD       Or  . acetaminophen (TYLENOL) suppository 650 mg  650 mg Rectal Q6H PRN Salary, Avel Peace, MD      .  bisacodyl (DULCOLAX) EC tablet 5 mg  5 mg Oral Daily PRN Salary, Montell D, MD      . glycopyrrolate (ROBINUL) injection 0.4 mg  0.4 mg Intravenous TID PRN Dellinger, Haynes Dage L, PA-C      . ipratropium-albuterol (DUONEB) 0.5-2.5 (3) MG/3ML nebulizer solution 3 mL  3 mL Nebulization Q6H PRN Salary, Montell D, MD      . lactated ringers infusion   Intravenous Continuous Wilhelmina Mcardle, MD 50 mL/hr at 08/05/17 1057    . LORazepam (ATIVAN) injection 0.25 mg  0.25 mg Intravenous  BID PRN Dellinger, Bobby Rumpf, PA-C      . morphine 2 MG/ML injection 1-2 mg  1-2 mg Intravenous Q2H PRN Dellinger, Marianne L, PA-C   2 mg at 08/05/17 1245  . ondansetron (ZOFRAN) injection 4 mg  4 mg Intravenous Q6H PRN Salary, Montell D, MD      . polyethylene glycol (MIRALAX / GLYCOLAX) packet 17 g  17 g Oral Daily PRN Salary, Montell D, MD      . sodium chloride flush (NS) 0.9 % injection 3 mL  3 mL Intravenous Q12H Salary, Montell D, MD   3 mL at 08/05/17 1058     Discharge Medications: Please see discharge summary for a list of discharge medications.  Relevant Imaging Results:  Relevant Lab Results:   Additional Information (SSN: [id number])  Aylissa Heinemann, Veronia Beets, LCSW

## 2017-08-05 NOTE — Progress Notes (Signed)
Uncertain at [place] NAME: Kristin Avila    MR#:  [id number]  DATE OF BIRTH:  [birth year]  SUBJECTIVE:  CHIEF COMPLAINT:   Chief Complaint  Patient presents with  . Abnormal Lab  . Altered Mental Status   The patient is lethargic and unresponsive, on oxygen by nasal cannula REVIEW OF SYSTEMS:  Review of Systems  Unable to perform ROS: Mental status change    DRUG ALLERGIES:  No Known Allergies VITALS:  Blood pressure (!) 84/51, pulse 70, temperature (!) 97.2 F (36.2 C), temperature source Axillary, resp. rate 20, height 5\' 6"  (1.676 m), weight 168 lb 10.4 oz (76.5 kg), SpO2 98 %. PHYSICAL EXAMINATION:  Physical Exam  Cardiovascular: Normal rate, regular rhythm and normal heart sounds. Exam reveals no friction rub.  No murmur heard. Pulmonary/Chest: No respiratory distress. She has rales.  Abdominal: Soft. She exhibits no distension.  Psychiatric:  Patient is lethargic and unresponsive   LABORATORY PANEL:  Female CBC Recent Labs  Lab 08/05/17 0222  WBC 7.7  HGB 14.2  HCT 45.3  PLT 121*   ------------------------------------------------------------------------------------------------------------------ Chemistries  Recent Labs  Lab 07/30/17 0358 07/27/2017 1708 08/05/17 0831  NA 138 139 140  K 3.8 4.5 4.1  CL 103 104 109  CO2 26 25 24   GLUCOSE 81 134* 86  BUN 26* 49* 48*  CREATININE 1.59* 1.87* 1.69*  CALCIUM 9.0 9.1 8.7*  MG 2.2  --   --   AST 92* 59*  --   ALT 99* 74*  --   ALKPHOS 159* 196*  --   BILITOT 2.6* 2.1*  --    RADIOLOGY:  Dg Chest Portable 1 View  Result Date: 07/23/2017 CLINICAL DATA:  Hypotension EXAM: PORTABLE CHEST 1 VIEW COMPARISON:  07/29/2017, 07/25/2017 FINDINGS: Left-sided pacing device as before. Small bilateral pleural effusions with loculated pleural fluid on the right, similar compared to prior. Continued cardiomegaly with vascular congestion and mild interstitial edema. Aortic  atherosclerosis. Dense consolidation at the left base. No pneumothorax. IMPRESSION: 1. No significant interval change in bilateral pleural effusions, loculated on the right 2. Continued cardiomegaly with vascular congestion and pulmonary edema. Dense atelectasis or pneumonia at the left base also unchanged. Electronically Signed   By: Donavan Foil M.D.   On: 07/23/2017 17:29   ASSESSMENT AND PLAN:   1 acute toxic metabolic encephalopathy Most likely secondary to medication side effect as well as acute dehydration  2 AKI w/ CKD Most likely secondary to medication side effect as well as poor p.o. Intake/appetite Hold Lasix, IV fluids for gentle rehydration.  3 acute on chronic abnormal chest x-ray findings Noted for no change in bilateral pleural effusions-loculated on right Dr. Alva Garnet, no need thoracentesis.  4 acute on chronic hypoxic respiratory failure Suspect related to underlying pleural effusions On O2 [place].  5 chronic diastolic congestive heart failure without exacerbation Patient with normal ejection fraction Lasix/metoprolol on hold for relative hypotension.  6 acute hypotension with chronic benign essential hypertension  Hold antihypertensives as well as diuretics, discontinued IV fluid.  7 acute on chronic anorexia/poor appetite 8.  Severe malnutrition.  The patient has very poor prognosis. After discussed with Dr. Jamal Collin and platelet care nurse practitioner, the family decided comfort care. All the records are reviewed and case discussed with Care Management/Social Worker. Management plans discussed with the patient's 2 sons and they are in agreement.  CODE STATUS: DNR  TOTAL TIME TAKING CARE OF THIS PATIENT: 32 minutes.  More than 50% of the time was spent in counseling/coordination of care: YES  POSSIBLE D/C IN ? DAYS, DEPENDING ON CLINICAL CONDITION.   Demetrios Loll M.D on 08/05/2017 at 3:48 PM  Between 7am to 6pm - Pager - [phone]  After 6pm go to  [email or web address] - [facility]

## 2017-08-05 NOTE — Progress Notes (Signed)
Pt axillary temp is now 97.6. Pt will respond and open eye when asked, pt responds to questions by moaning or incomprehensible speech. Per pt daughter at bedside, pt has been unable to take medication or food by mouth for two weeks, daughter Hilda Blades has taken maybe 20 bites in the last two weeks." Palliative is consulted. Will continue to assess.

## 2017-08-05 NOTE — Progress Notes (Signed)
[place] responded to an OR for prayer. The Pt is unable to respond. Son, daughter, and brother are bedside. The son stated that they were going to monitor his mother for the next 24 hours and then decide whether or not to transition to comfort care. It is a hard decision for them, but they desire what is best for the Pt. CH offered a prayer for wisdom and is available for follow up as needed.    08/05/17 1300  Clinical Encounter Type  Visited With Patient;Patient and family together;Health care provider  Visit Type Initial;Spiritual support;Critical Care  Referral From Nurse  Consult/Referral To Chaplain  Spiritual Encounters  Spiritual Needs Prayer

## 2017-08-06 DIAGNOSIS — E43 Unspecified severe protein-calorie malnutrition: Secondary | ICD-10-CM

## 2017-08-06 DIAGNOSIS — J9600 Acute respiratory failure, unspecified whether with hypoxia or hypercapnia: Secondary | ICD-10-CM

## 2017-08-06 DIAGNOSIS — Z515 Encounter for palliative care: Secondary | ICD-10-CM

## 2017-08-06 DIAGNOSIS — R0902 Hypoxemia: Secondary | ICD-10-CM

## 2017-08-06 LAB — URINE CULTURE: Culture: NO GROWTH

## 2017-08-06 NOTE — Progress Notes (Signed)
Daily Progress Note   Patient Name: Kristin Avila       Date: 08/06/2017 DOB: Mar 31, 1928  Age: 83 y.o. MRN#: [id number] Attending Physician: Demetrios Loll, MD Primary Care Physician: Einar Pheasant, MD Admit Date: 07/20/2017  Reason for Consultation/Follow-up: Establishing goals of care  Subjective: Patient actively dying.  Flutters her eyes slightly to my voice, but does not wake up or respond.  Maurene Capes (grand son) at bedside.  He describes some apneic breathing over night. Family appreciative of care.   Assessment: Patient actively dying.  Family at peace with it.   Patient Profile/HPI:  83 y.o. female with past medical history of CHF with recurrent right pleural effusion, afib, osteo arthritis, and sick sinus syndrome s/p pace maker placement, who was admitted on 07/29/2017 with altered mental status.  She was found to have acute kidney injury, hypotension, and hypothermia.  Her lactic acid was 3.1.  She has received treatment in the ICU.  During the afternoon of 12/27 family elected full comfort care.     Length of Stay: 2  Current Medications: Scheduled Meds:  . sodium chloride flush  3 mL Intravenous Q12H  . sodium chloride flush  3 mL Intravenous Q12H    Continuous Infusions: . sodium chloride    . morphine 1 mg/hr (08/05/17 1720)    PRN Meds: sodium chloride, acetaminophen **OR** acetaminophen, antiseptic oral rinse, glycopyrrolate **OR** glycopyrrolate **OR** glycopyrrolate, haloperidol **OR** haloperidol **OR** haloperidol lactate, ipratropium-albuterol, LORazepam, morphine, ondansetron **OR** ondansetron (ZOFRAN) IV, polyethylene glycol, polyvinyl alcohol, sodium chloride flush  Physical Exam       Well developed female, essentially non-responsive, actively dying.   Color good. CV rrr  resp no distress, no wet sounds or gurgling. Extremities warm   Vital Signs: BP (!) 96/58 (BP Location: Left Arm)   Pulse 69   Temp 97.6 F (36.4 C) (Oral)   Resp 20   Ht 5\' 6"  (1.676 m)   Wt 76.5 kg (168 lb 10.4 oz)   SpO2 97%   BMI 27.22 kg/m  SpO2: SpO2: 97 % O2 Device: O2 Device: Nasal Cannula O2 Flow Rate: O2 Flow Rate (L/min): 4 L/min  Intake/output summary:   Intake/Output Summary (Last 24 hours) at 08/06/2017 0751 Last data filed at 08/06/2017 9977 Gross per 24 hour  Intake 284.5 ml  Output 350 ml  Net -65.5 ml   LBM: Last BM Date: 08/05/17 Baseline Weight: Weight: 77.6 kg (171 lb) Most recent weight: Weight: 76.5 kg (168 lb 10.4 oz)       Palliative Assessment/Data: 10%      Patient Active Problem List   Diagnosis Date Noted  . Protein-calorie malnutrition, severe 08/05/2017  . Acute on chronic congestive heart failure ([place])   . Pleural effusion   . Palliative care encounter   . Goals of care, counseling/discussion   . Encounter for hospice care discussion   . Change in mental status 07/14/2017  . Hypothermia 07/16/2017  . Malnutrition of moderate degree 07/28/2017  . Acute respiratory distress 07/25/2017  . AKI (acute kidney injury) ([place]) 07/25/2017  . Sleeping difficulty 05/29/2017  . Symptomatic anemia 05/29/2017  . Iron deficiency anemia   . Iron deficiency anemia due to chronic blood loss   . DVT (deep venous thrombosis) ([place]) 05/20/2017  . Community acquired pneumonia 05/20/2017  . Encephalopathy acute 04/29/2017  . Pleural effusion on right 04/18/2017  . Hypotension 04/18/2017  . Hyperlipidemia, mixed 02/01/2017  . Cellulitis of right upper extremity 01/27/2017  . Sick sinus syndrome ([place]) 01/04/2017  . Syncope 12/08/2016  . BCC (basal cell carcinoma), face 04/24/2016  . Decreased hearing 05/21/2015  . Benign essential hypertension 03/13/2015  . Constipation 01/13/2015  . Health care maintenance 10/14/2014  .  Anxiety 10/02/2012  . Atrial fibrillation ([place]) 07/09/2012  . Osteoarthritis 07/08/2012  . Hypercholesterolemia 07/08/2012  . Hypertension 07/08/2012    Palliative Care Plan    Recommendations/Plan:  Continue full comfort measures  Patient is actively dying - not appropriate for transfer/emt ride.  Goals of Care and Additional Recommendations:  Limitations on Scope of Treatment: Full Comfort Care  Code Status:  DNR  Prognosis:   Hours - Days   Discharge Planning:  Anticipated Hospital Death  Care plan was discussed with family Audrie Lia and Maurene Capes), bedside RN  Thank you for allowing the Palliative Medicine Team to assist in the care of this patient.  Total time spent:  35 min.     Greater than 50%  of this time was spent counseling and coordinating care related to the above assessment and plan.  Florentina Jenny, PA-C Palliative Medicine  Please contact Palliative MedicineTeam phone at [phone] for questions and concerns between 7 am - 7 pm.   Please see AMION for individual provider pager numbers.

## 2017-08-06 NOTE — Progress Notes (Signed)
   Kristin Avila NAME: Kristin Avila    MR#:  [id number]  DATE OF BIRTH:  July 27, 1928  SUBJECTIVE:  CHIEF COMPLAINT:   Chief Complaint  Patient presents with  . Abnormal Lab  . Altered Mental Status   The patient is lethargic and unresponsive, on oxygen by nasal cannula, on comfort care. REVIEW OF SYSTEMS:  Review of Systems  Unable to perform ROS: Mental status change    DRUG ALLERGIES:  No Known Allergies VITALS:  Blood pressure (!) 96/58, pulse 69, temperature 97.6 F (36.4 C), temperature source Oral, resp. rate 20, height 5\' 6"  (1.676 m), weight 168 lb 10.4 oz (76.5 kg), SpO2 97 %. PHYSICAL EXAMINATION:  Physical Exam LABORATORY PANEL:  Female CBC Recent Labs  Lab 08/05/17 0222  WBC 7.7  HGB 14.2  HCT 45.3  PLT 121*   ------------------------------------------------------------------------------------------------------------------ Chemistries  Recent Labs  Lab 07/20/2017 1708 08/05/17 0831  NA 139 140  K 4.5 4.1  CL 104 109  CO2 25 24  GLUCOSE 134* 86  BUN 49* 48*  CREATININE 1.87* 1.69*  CALCIUM 9.1 8.7*  AST 59*  --   ALT 74*  --   ALKPHOS 196*  --   BILITOT 2.1*  --    RADIOLOGY:  No results found. ASSESSMENT AND PLAN:   1 acute toxic metabolic encephalopathy Most likely secondary to medication side effect as well as acute dehydration  2 AKI w/ CKD Most likely secondary to medication side effect as well as poor p.o. Intake/appetite Hold Lasix, IV fluids for gentle rehydration.  3 acute on chronic abnormal chest x-ray findings Noted for no change in bilateral pleural effusions-loculated on right Dr. Alva Garnet, no need thoracentesis.  4 acute on chronic hypoxic respiratory failure Suspect related to underlying pleural effusions On O2 [place].  5 chronic diastolic congestive heart failure without exacerbation Patient with normal ejection fraction Lasix/metoprolol on hold for relative  hypotension.  6 acute hypotension with chronic benign essential hypertension  Hold antihypertensives as well as diuretics, discontinued IV fluid.  7 acute on chronic anorexia/poor appetite 8.  Severe malnutrition.  The patient has very poor prognosis, on comfort care, may die soon. All the records are reviewed and case discussed with Care Management/Social Worker. Management plans discussed with the patient's 2 sons and they are in agreement.  CODE STATUS: DNR  TOTAL TIME TAKING CARE OF THIS PATIENT: 16 minutes.   More than 50% of the time was spent in counseling/coordination of care: YES  POSSIBLE D/C IN ? DAYS, DEPENDING ON CLINICAL CONDITION.   Demetrios Loll M.D on 08/06/2017 at 12:33 PM  Between 7am to 6pm - Pager - [phone]  After 6pm go to [email or web address] - [place] Hospital[facility]

## 2017-08-06 NOTE — Care Management Important Message (Signed)
Important Message  Patient Details  Name: Kristin Avila MRN: [id number] Date of Birth: Oct 17, 1927   Medicare Important Message Given:  Yes  Signed by son Lailynn [place]gate, RN 08/06/2017, 11:12 AM

## 2017-08-07 NOTE — Progress Notes (Signed)
Family remain at bedside. Emotional support provided to all. No signs of discomfort noted. Morphine gtt infusing at 2. Madlyn Frankel, RN

## 2017-08-07 NOTE — Progress Notes (Signed)
[facility] [place] NAME: Kristin Avila    MR#:  [id number]  DATE OF BIRTH:  12/06/1927  SUBJECTIVE:   Patient is on morphine drip.  A lot of family members in the room.  Family states she is comfortable.  unresponsive and nonverbal. REVIEW OF SYSTEMS:   Review of Systems  Unable to perform ROS: Patient unresponsive   DRUG ALLERGIES:  No Known Allergies  VITALS:  Blood pressure (!) 93/44, pulse 75, temperature (!) 96.3 F (35.7 C), temperature source Axillary, resp. rate 16, height 5\' 6"  (1.676 m), weight 76.5 kg (168 lb 10.4 oz), SpO2 94 %.  PHYSICAL EXAMINATION:   Physical Exam limited exam since patient is comfort care only  GENERAL:  83 y.o.-year-old patient lying in the bed with no acute distress.  NECK:  Supple, no jugular venous distention. No thyroid enlargement, no tenderness.  LUNGS: Normal breath sounds bilaterally, no wheezing, rales, rhonchi. No use of accessory muscles of respiration.  CARDIOVASCULAR: S1, S2 normal. No murmurs, rubs, or gallops.  ABDOMEN: Soft, nontender, nondistended. Bowel sounds present. No organomegaly or mass.   LABORATORY PANEL:  CBC Recent Labs  Lab 08/05/17 0222  WBC 7.7  HGB 14.2  HCT 45.3  PLT 121*    Chemistries  Recent Labs  Lab 08/07/2017 1708 08/05/17 0831  NA 139 140  K 4.5 4.1  CL 104 109  CO2 25 24  GLUCOSE 134* 86  BUN 49* 48*  CREATININE 1.87* 1.69*  CALCIUM 9.1 8.7*  AST 59*  --   ALT 74*  --   ALKPHOS 196*  --   BILITOT 2.1*  --    Cardiac Enzymes Recent Labs  Lab 08/05/17 0831  TROPONINI 0.06*   RADIOLOGY:  No results found. ASSESSMENT AND PLAN:   1acute toxic metabolic encephalopathy Most likely secondary to medication side effect as well as acute dehydration  2 AKI w/ CKD Most likely secondary to medication side effect as well as poor p.o. Intake/appetite  3acute on chronic abnormal chest x-ray findings Noted for no change in  bilateral pleural effusions-loculated on right  4acute on chronic hypoxic respiratory failure Suspect related to underlying pleural effusions On O2 [place].  5chronic diastolic congestive heart failure without exacerbation Patient with normal ejection fraction  6acute hypotension with chronic benign essential hypertension   7acute on chronic anorexia/poor appetite  8.  Severe malnutrition.  Patient is on full comfort care.  She is getting IV morphine drip.  Case discussed with Care Management/Social Worker. Management plans discussed with the patient, family and they are in agreement.  CODE STATUS: DNR   TOTAL TIME TAKING CARE OF THIS PATIENT: *15* minutes.  >50% time spent on counselling and coordination of care  Note: This dictation was prepared with Dragon dictation along with smaller phrase technology. Any transcriptional errors that result from this process are unintentional.  Fritzi Mandes M.D on 08/07/2017 at 2:38 PM  Between 7am to 6pm - Pager - [phone]  After 6pm go to [email or web address] - password [email or web address] [facility]  Office  [phone]  CC: Primary care physician; Einar Pheasant, MDPatient ID: Kristin Avila, female   DOB: 08/11/1927, 83 y.o.   MRN: [id number]

## 2017-08-10 LAB — CULTURE, BLOOD (ROUTINE X 2)
CULTURE: NO GROWTH
Culture: NO GROWTH
Special Requests: ADEQUATE

## 2017-08-10 NOTE — Death Summary Note (Signed)
DEATH SUMMARY   Patient Details  Name: Kristin Avila MRN: [id number] DOB: [birth year]  Admission/Discharge Information   Admit Date:  29-Aug-2017  Date of Death: Date of Death: 09-02-17  Time of Death: Time of Death: 0432  Length of Stay: 4  Referring Physician: Einar Pheasant, MD   Reason(s) for Hospitalization   Altered mental status abnormal labs Diagnoses  Preliminary cause of death:    Acute on chronic hypoxic respiratory failure due to bilateral pleural effusions Acute on chronic kidney injury Acute toxic metabolic encephalopathy Secondary Diagnoses (including complications and co-morbidities):  Active Problems:   Change in mental status   Hypothermia   Acute on chronic congestive heart failure (HCC)   Pleural effusion   Palliative care encounter   Goals of care, counseling/discussion   Encounter for hospice care discussion   Protein-calorie malnutrition, severe   Acute respiratory failure ([place])   Hypoxia   Terminal care   Brief Hospital Course (including significant findings, care, treatment, and services provided and events leading to death)  Kristin Avila is a 85 y.o. year old female who was brought in with altered mental status and abnormal labs.  She was admitted with acute metabolic toxic encephalopathy.  Patient was also found to be dehydrated and in acute on chronic hypoxic respiratory failure secondary bilateral pleural effusions.  Patient also had hypotension and hypothermia.  He is critically ill.  Palliative care was consulted.  Discussion was made by palliative care with her children and patient was transition to full comfort care.  She was started on IV morphine infusion drip.  Patient passed away 02-Sep-2017 at 4:32 AM   Pertinent Labs and Studies  Significant Diagnostic Studies Dg Chest 2 View  Result Date: 07/29/2017 CLINICAL DATA:  Progressive weakness, shortness of breath, and anorexia. History of CHF, diabetes, and recently admitted for  pleural effusion and a electrolyte imbalance. EXAM: CHEST  2 VIEW COMPARISON:  Chest x-ray and chest CT scan of July 25, 2017 FINDINGS: Again demonstrated is abnormal soft tissue density in the right pleural space consistent with loculated pleural effusion. The right upper lobe is clear. On the left the interstitial markings are increased diffusely. The left hemidiaphragm remains obscured and somewhat more retrocardiac density is present. The cardiac silhouette remains enlarged. The ICD is in stable position. There is calcification in the wall of the aortic arch. IMPRESSION: Stable appearance of the right pleural space consistent with loculated pleural effusion. CHF with pulmonary interstitial edema greatest on the left. Probable left pleural effusion slightly more conspicuous today. Left basilar atelectasis or pneumonia is more conspicuous today Thoracic aortic atherosclerosis. Electronically Signed   By: David  Martinique M.D.   On: 07/29/2017 09:50   Ct Chest Wo Contrast  Result Date: 07/25/2017 CLINICAL DATA:  84 year old female with sick sinus syndrome with pacemaker placement, hypertension and diabetes admitted for pleural effusions and weakness. EXAM: CT CHEST WITHOUT CONTRAST TECHNIQUE: Multidetector CT imaging of the chest was performed following the standard protocol without IV contrast. COMPARISON:  07/25/2017 CXR, 05/20/2017 CT FINDINGS: Cardiovascular: Stable cardiomegaly with left anterior chest wall pacemaker apparatus with right atrial and right ventricular leads. No significant pericardial effusion or thickening. Mild-to-moderate aortic atherosclerosis without aneurysm. The unenhanced main pulmonary artery is mildly dilated to 2.9 cm consistent with chronic pulmonary hypertension. Mediastinum/Nodes: Small prevascular and paratracheal lymph nodes without pathologic enlargement. The study is limited in the assessment for hilar adenopathy due to lack of IV contrast. Lungs/Pleura: Stable chronic  left pleural effusion with slight  interval increase in loculated right pleural effusion extending along the lateral aspect of the right lung base and into the major fissure. The lungs are otherwise stable in appearance without active pulmonary disease. Some passive atelectasis at the left lung base with compressive atelectasis bilaterally. Upper Abdomen: No acute abnormality. Hyperdense noncontrast appearance of the liver can be seen with amiodarone use which the patient is apparently on per her medication list. Punctate calcification in the upper pole the right kidney is suggested, partially imaged. Musculoskeletal: No chest wall mass or suspicious bone lesions identified thoracic spondylosis. No acute nor suspicious osseous lesions. Osteoarthritis of the glenohumeral joints bilaterally. IMPRESSION: 1. Slight interval increase in loculated right pleural effusion stable small left pleural effusion. No active pulmonary disease. 2. Stable cardiomegaly with ICD device in place. 3. Aortic atherosclerosis without aneurysm. 4. Mild dilatation of the main pulmonary artery to 2.9 cm consistent with a component of chronic pulmonary arterial hypertension. 5. Hyperdense appearance the liver consistent with amiodarone use. 6. Calculus in the upper pole of the included right kidney. No obstructive uropathy. 7. Thoracic spondylosis and bilateral glenohumeral joint osteoarthritis. Aortic Atherosclerosis (ICD10-I70.0). Electronically Signed   By: Ashley Royalty M.D.   On: 07/25/2017 20:27   Dg Chest Portable 1 View  Result Date: 07/16/2017 CLINICAL DATA:  Hypotension EXAM: PORTABLE CHEST 1 VIEW COMPARISON:  07/29/2017, 07/25/2017 FINDINGS: Left-sided pacing device as before. Small bilateral pleural effusions with loculated pleural fluid on the right, similar compared to prior. Continued cardiomegaly with vascular congestion and mild interstitial edema. Aortic atherosclerosis. Dense consolidation at the left base. No pneumothorax.  IMPRESSION: 1. No significant interval change in bilateral pleural effusions, loculated on the right 2. Continued cardiomegaly with vascular congestion and pulmonary edema. Dense atelectasis or pneumonia at the left base also unchanged. Electronically Signed   By: Donavan Foil M.D.   On: 08/06/2017 17:29   Dg Chest Portable 1 View  Result Date: 07/25/2017 CLINICAL DATA:  Weakness EXAM: PORTABLE CHEST 1 VIEW COMPARISON:  05/19/2017 FINDINGS: Cardiomegaly. Loculated right pleural effusion and small layering left pleural effusion noted. Bilateral lower lobe airspace opacities. Mild vascular congestion. No acute bony abnormality. Left pacer is unchanged. IMPRESSION: Moderate loculated right pleural effusion has increased since prior study. Small layering left pleural effusion. Bilateral lower lobe atelectasis or infiltrates. Cardiomegaly, vascular congestion. Electronically Signed   By: Rolm Baptise M.D.   On: 07/25/2017 16:08    Microbiology Recent Results (from the past 240 hour(s))  MRSA PCR Screening     Status: None   Collection Time: 07/31/2017 11:33 PM  Result Value Ref Range Status   MRSA by PCR NEGATIVE NEGATIVE Final    Comment:        The GeneXpert MRSA Assay (FDA approved for NASAL specimens only), is one component of a comprehensive MRSA colonization surveillance program. It is not intended to diagnose MRSA infection nor to guide or monitor treatment for MRSA infections. Performed at [facility], [address], [place], [place] [address]   Urine Culture     Status: None   Collection Time: 08/06/2017 11:33 PM  Result Value Ref Range Status   Specimen Description   Final    URINE, RANDOM Performed at [facility], [address], [place], [place] [address]    Special Requests   Final    NONE Performed at [facility], [address] [place],  [address]    Culture   Final    NO GROWTH Performed at [facility], [place] [address], [place], [place] [address]    Report Status 08/06/2017 FINAL  Final  CULTURE, BLOOD (ROUTINE X 2) w Reflex to ID Panel     Status: None (Preliminary result)   Collection Time: 07/25/2017 11:59 PM  Result Value Ref Range Status   Specimen Description BLOOD LT [facility]  Final   Special Requests   Final    BOTTLES DRAWN AEROBIC AND ANAEROBIC Blood Culture adequate volume   Culture   Final    NO GROWTH 3 DAYS Performed at [facility], [address] [place], [place] [address]    Report Status PENDING  Incomplete  CULTURE, BLOOD (ROUTINE X 2) w Reflex to ID Panel     Status: None (Preliminary result)   Collection Time: 08/05/17 12:08 AM  Result Value Ref Range Status   Specimen Description BLOOD RT HAND   Final   Special Requests   Final    BOTTLES DRAWN AEROBIC AND ANAEROBIC Blood Culture results may not be optimal due to an inadequate volume of blood received in culture bottles   Culture   Final    NO GROWTH 3 DAYS Performed at [facility], [address] [place], [place] [address]    Report Status PENDING  Incomplete    Lab Basic Metabolic Panel: Recent Labs  Lab 07/28/2017 1708 08/05/17 0831  NA 139 140  K 4.5 4.1  CL 104 109  CO2 25 24  GLUCOSE 134* 86  BUN 49* 48*  CREATININE 1.87* 1.69*  CALCIUM 9.1 8.7*   Liver Function Tests: Recent Labs  Lab 08/03/2017 1708  AST 59*  ALT 74*  ALKPHOS 196*  BILITOT 2.1*  PROT 5.8*  ALBUMIN 2.5*   No results for input(s): LIPASE, AMYLASE in the last 168 hours. No results for input(s): AMMONIA in the last 168 hours. CBC: Recent Labs  Lab 07/15/2017 1708 08/05/17 0222  WBC 9.4 7.7  HGB 14.5 14.2  HCT 46.8 45.3  MCV 91.8 91.2  PLT 132* 121*   Cardiac Enzymes: Recent Labs  Lab 08/09/2017 1708 07/31/2017 2051 08/05/17 0222 08/05/17 0831  TROPONINI 0.05* 0.06* 0.06* 0.06*   Sepsis Labs: Recent Labs  Lab 07/27/2017 1708 07/18/2017 2020 07/12/2017 2051 08/05/17 0025 08/05/17 0222  PROCALCITON  --   --  0.39   --   --   WBC 9.4  --   --   --  7.7  LATICACIDVEN  --  3.0*  --  3.1*  --     Procedures/Operations    Fritzi Mandes 08/30/2017, 7:48 AM

## 2017-08-10 NOTE — Progress Notes (Signed)
Pt expired at 0432. Son at bedside. Pronounced by this RN and Aris Georgia, RN. AC and MD notified. Chaplin services offered. Emotional support provided. Awaiting more family members. Will continue to support.

## 2017-08-10 NOTE — Plan of Care (Signed)
Pt unresponsive. Comfort measures in place. No signs/symptoms of pain or discomfort. Morphine drip continued at 23ml/hr. Continue to monitor.

## 2017-08-10 NOTE — Progress Notes (Signed)
Chaplain paged to provide grief comfort to a family of a pt who died in [id number]. CH met the family at bedside. Family talked about the things the pt did when she was alive. They said she had a good heart and played piano for the church. 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 They shed tears and laughter as they talked about things the deceased loved to do. And then they asked [place] to pray for them. [place] prayed for comfort to cope with the loss and offered grief comfort and presence to the family. [place] stayed with the family for a while and then excused himself to give the family space to grieve and reflect on pt's life. CH is available to follow up as needed.    2017-08-26 0500  Clinical Encounter Type  Visited With Patient;Patient and family together  Visit Type Initial;Follow-up;Death  Referral From Nurse  Consult/Referral To Chaplain  Spiritual Encounters  Spiritual Needs Grief support;Other (Comment)

## 2017-08-10 NOTE — Progress Notes (Signed)
Family requested chaplin services. Chaplin notified. Will continue to support.

## 2017-08-10 NOTE — Progress Notes (Signed)
Funeral home arrived. AC at bedside with family. Madlyn Frankel, RN

## 2017-08-10 DEATH — deceased

## 2017-09-14 ENCOUNTER — Encounter: Payer: Medicare Other | Admitting: Internal Medicine

## 2017-09-28 ENCOUNTER — Other Ambulatory Visit: Payer: Self-pay | Admitting: Internal Medicine

## 2018-07-30 IMAGING — CT CT HEAD W/O CM
3 series · 15 of 46 positions shown, 18 images · non-contrast
Comparison: 08/20/2012

CLINICAL DATA: Fall with hematoma on the head.  Initial encounter.

EXAM:
CT HEAD WITHOUT CONTRAST
TECHNIQUE: Contiguous axial images were obtained from the base of the skull
through the vertex without intravenous contrast.

[Series 2: head wo · axial · 0.47mm/px · z∈[+408,+528]mm · 9 of 29 slices shown, 12 images]
[im 3/29  brain]
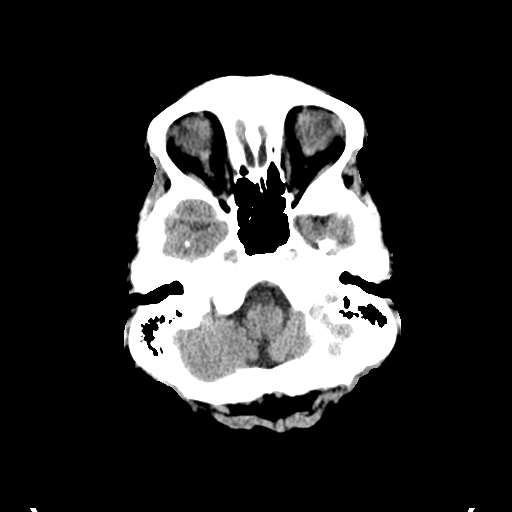
[im 3/29  bone]
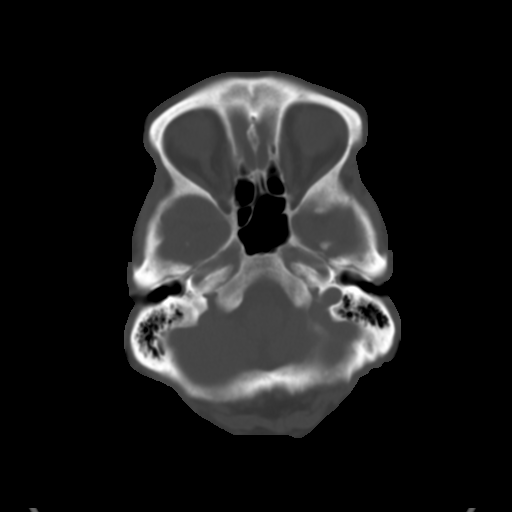
[im 6/29  brain]
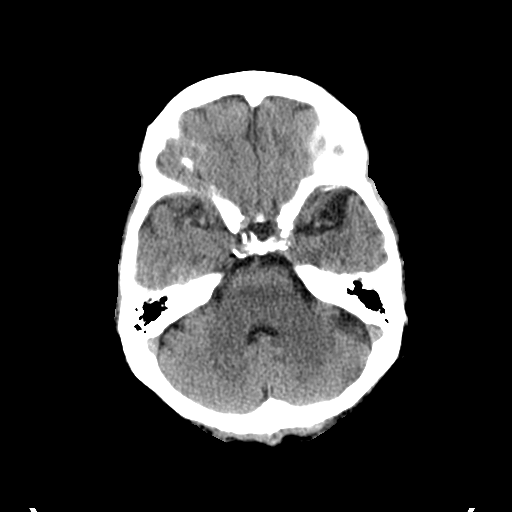
[im 9/29  brain]
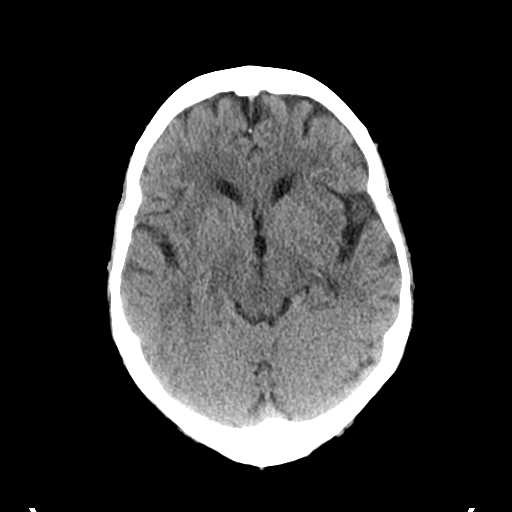
[im 12/29  brain]
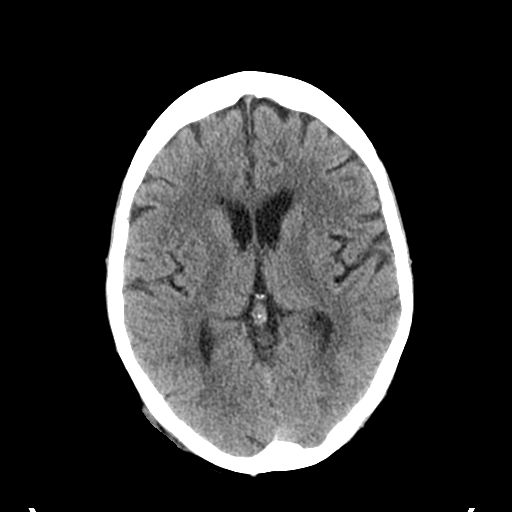
[im 15/29  brain]
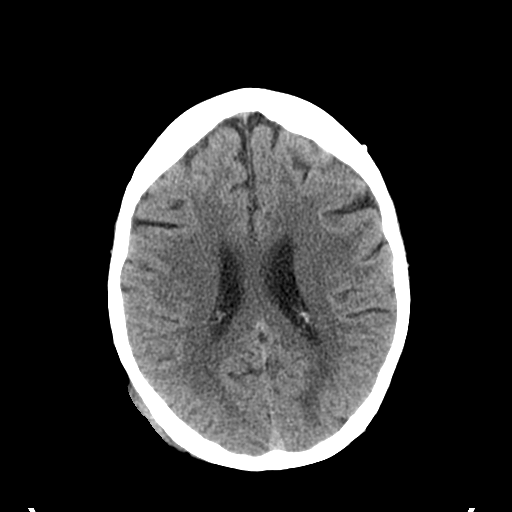
[im 15/29  bone]
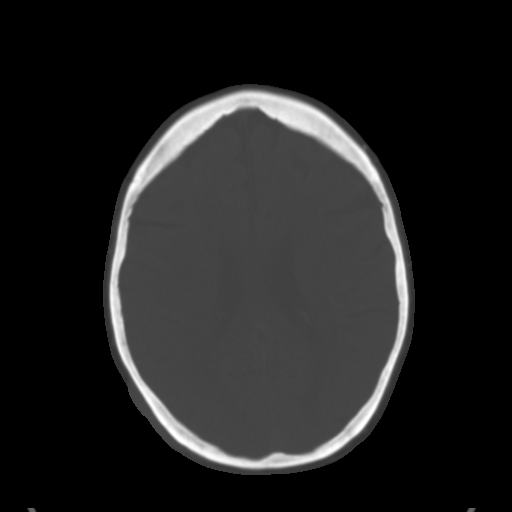
[im 18/29  brain]
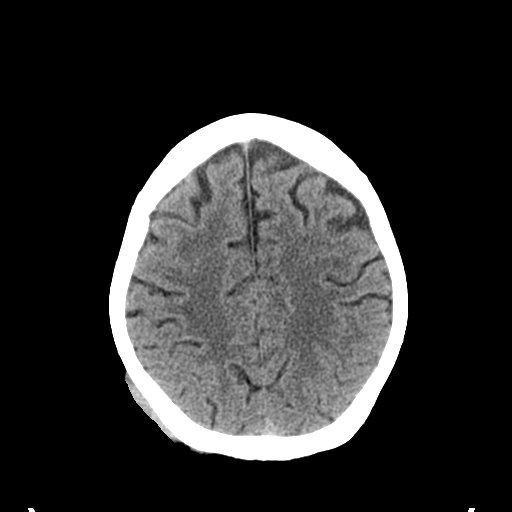
[im 21/29  brain]
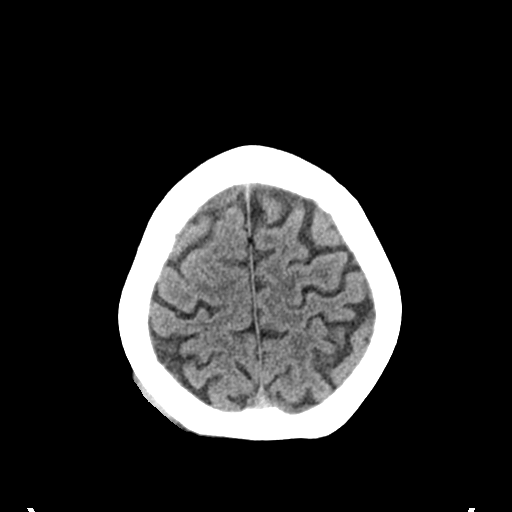
[im 24/29  brain]
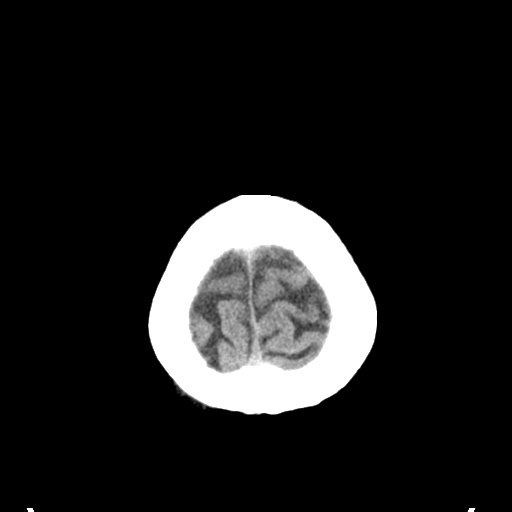
[im 27/29  brain]
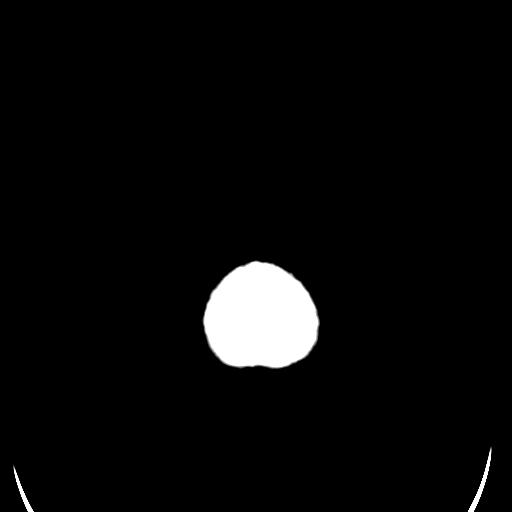
[im 27/29  bone]
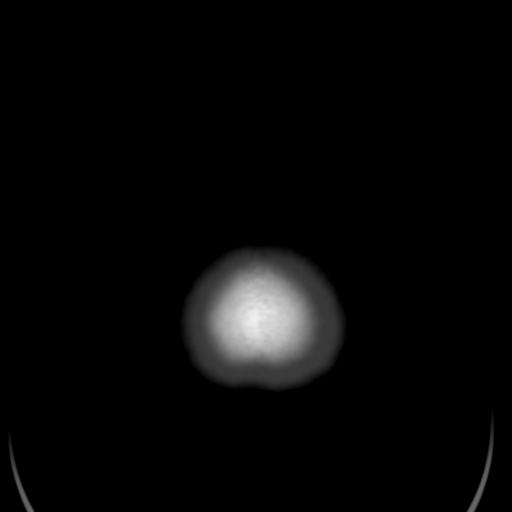

[Series 4: coronal soft tissue · coronal · 0.28mm/px · 3 of 62 slices shown]
[im 21/62  brain]
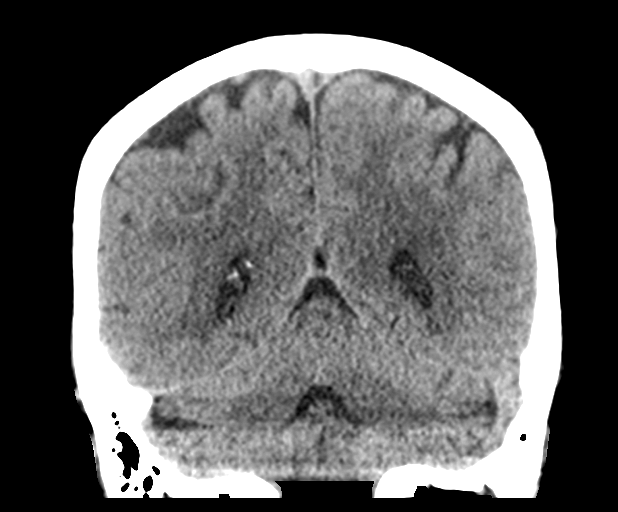
[im 28/62  brain]
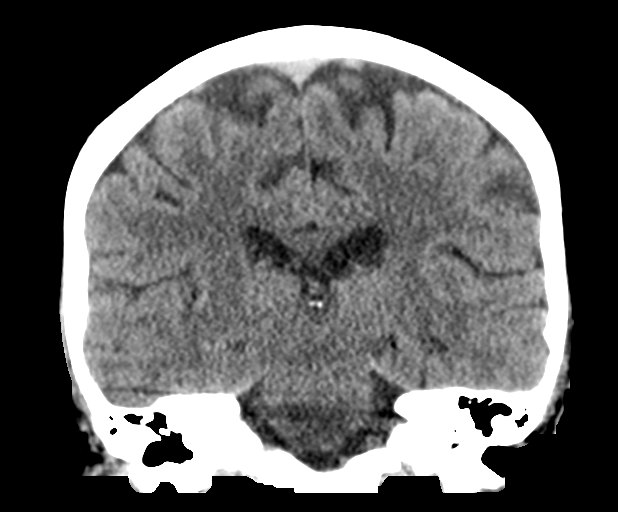
[im 34/62  brain]
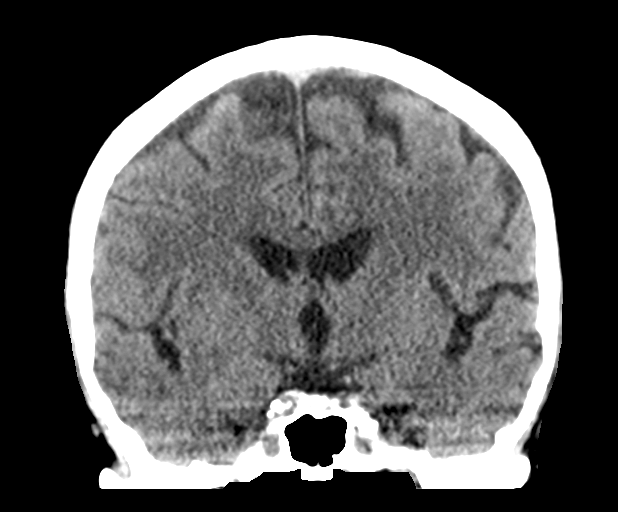

[Series 5: sagittal soft tissue · sagittal · 0.30mm/px · 3 of 49 slices shown]
[im 17/49  brain]
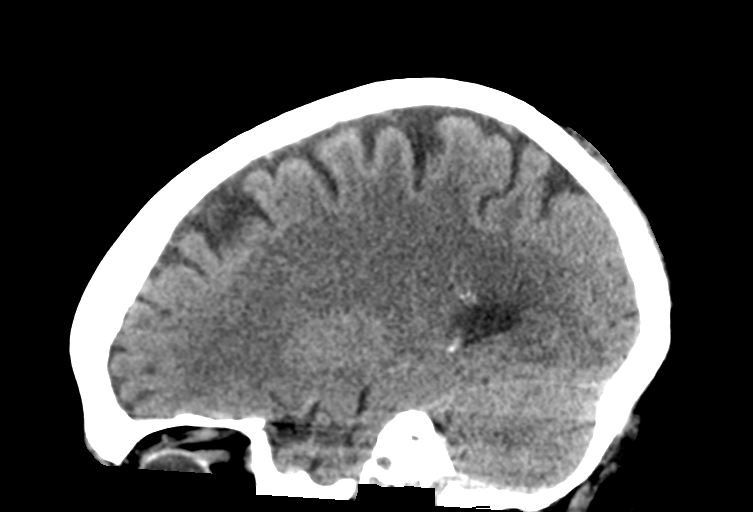
[im 25/49  brain]
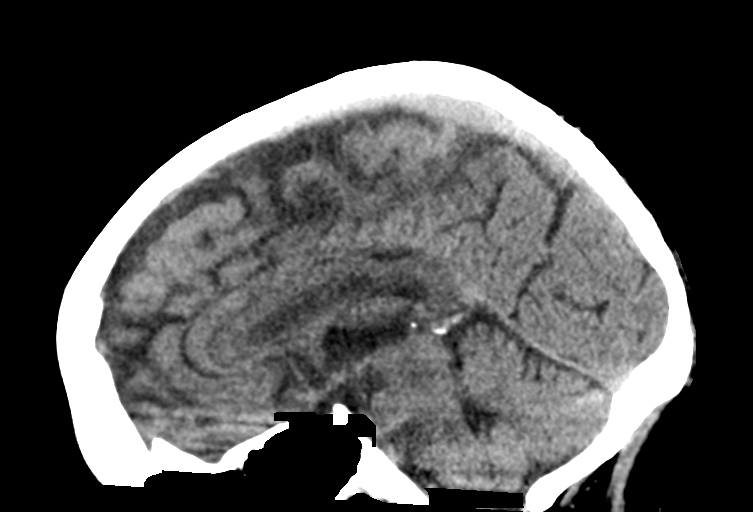
[im 33/49  brain]
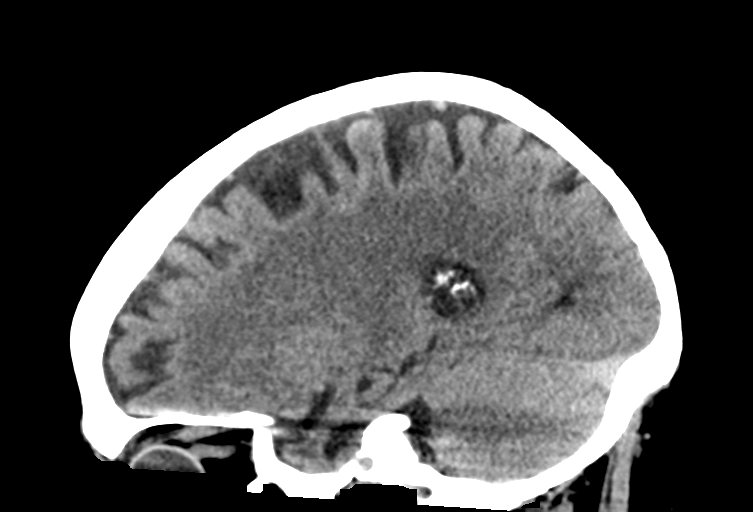

[15 of 46 positions shown; findings below may reference images not displayed]

FINDINGS: Brain: Normal. No evidence of acute or remote infarction,
hemorrhage, hydrocephalus, extra-axial collection or mass
lesion/mass effect.

Vascular: Atherosclerotic calcification.  No hyperdense vessel.

Skull: Right parietal scalp hematoma.  Negative for fracture

Sinuses/Orbits: No evidence of injury
IMPRESSION: 1. No evidence of intracranial injury.
2. Right parietal scalp hematoma without fracture.

## 2018-12-16 IMAGING — DX DG CHEST 1V PORT
1 series · 1 of 1 positions shown · non-contrast
Comparison: Radiograph April 25, 2017.

CLINICAL DATA: Status post right thoracoscopy.

EXAM:
PORTABLE CHEST 1 VIEW

[chest ap]
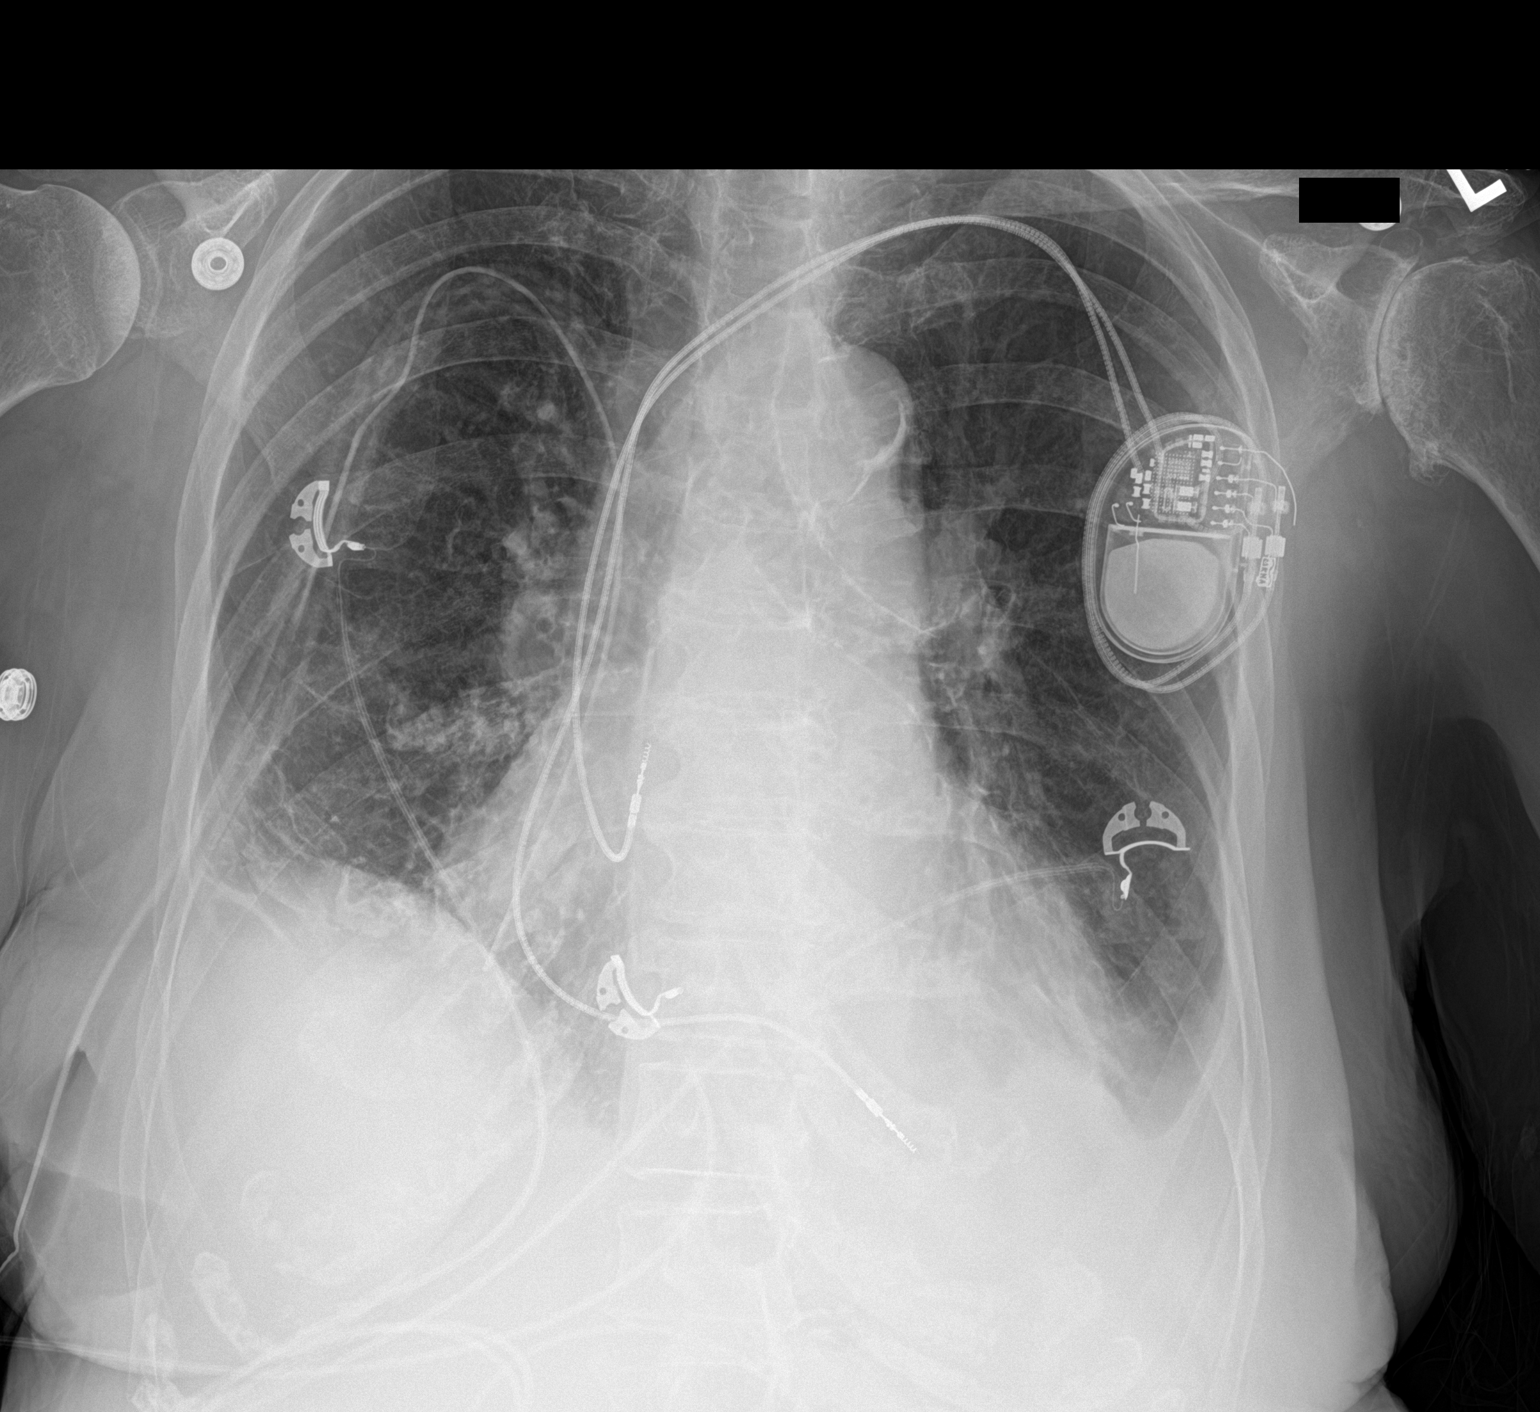

[1 of 1 positions shown; findings below may reference images not displayed]

FINDINGS: Stable cardiomegaly. Atherosclerosis of thoracic aorta is noted.
Left-sided pacemaker is unchanged in position. Stable mild right
apical pneumothorax is noted. Right-sided chest tube is unchanged in
position. Stable bibasilar subsegmental atelectasis with associated
pleural effusions is noted. Bony thorax is unremarkable.
Degenerative changes seen involving the left glenohumeral joint.
IMPRESSION: Aortic atherosclerosis. Stable bibasilar subsegmental atelectasis
with associated pleural effusions. Stable mild right apical
pneumothorax. Right-sided chest tube is unchanged in position.

## 2019-01-02 IMAGING — CR DG CHEST 2V
1 series · 2 of 2 positions shown · non-contrast
Comparison: 05/06/2017, 04/28/2017, 04/18/2017, 12/14/2016

CLINICAL DATA: Follow-up pneumonia

EXAM:
CHEST  2 VIEW

[Series 1: dg chest 2 view · 0.14mm/px · 2 of 2 slices shown]
[im 1/2]
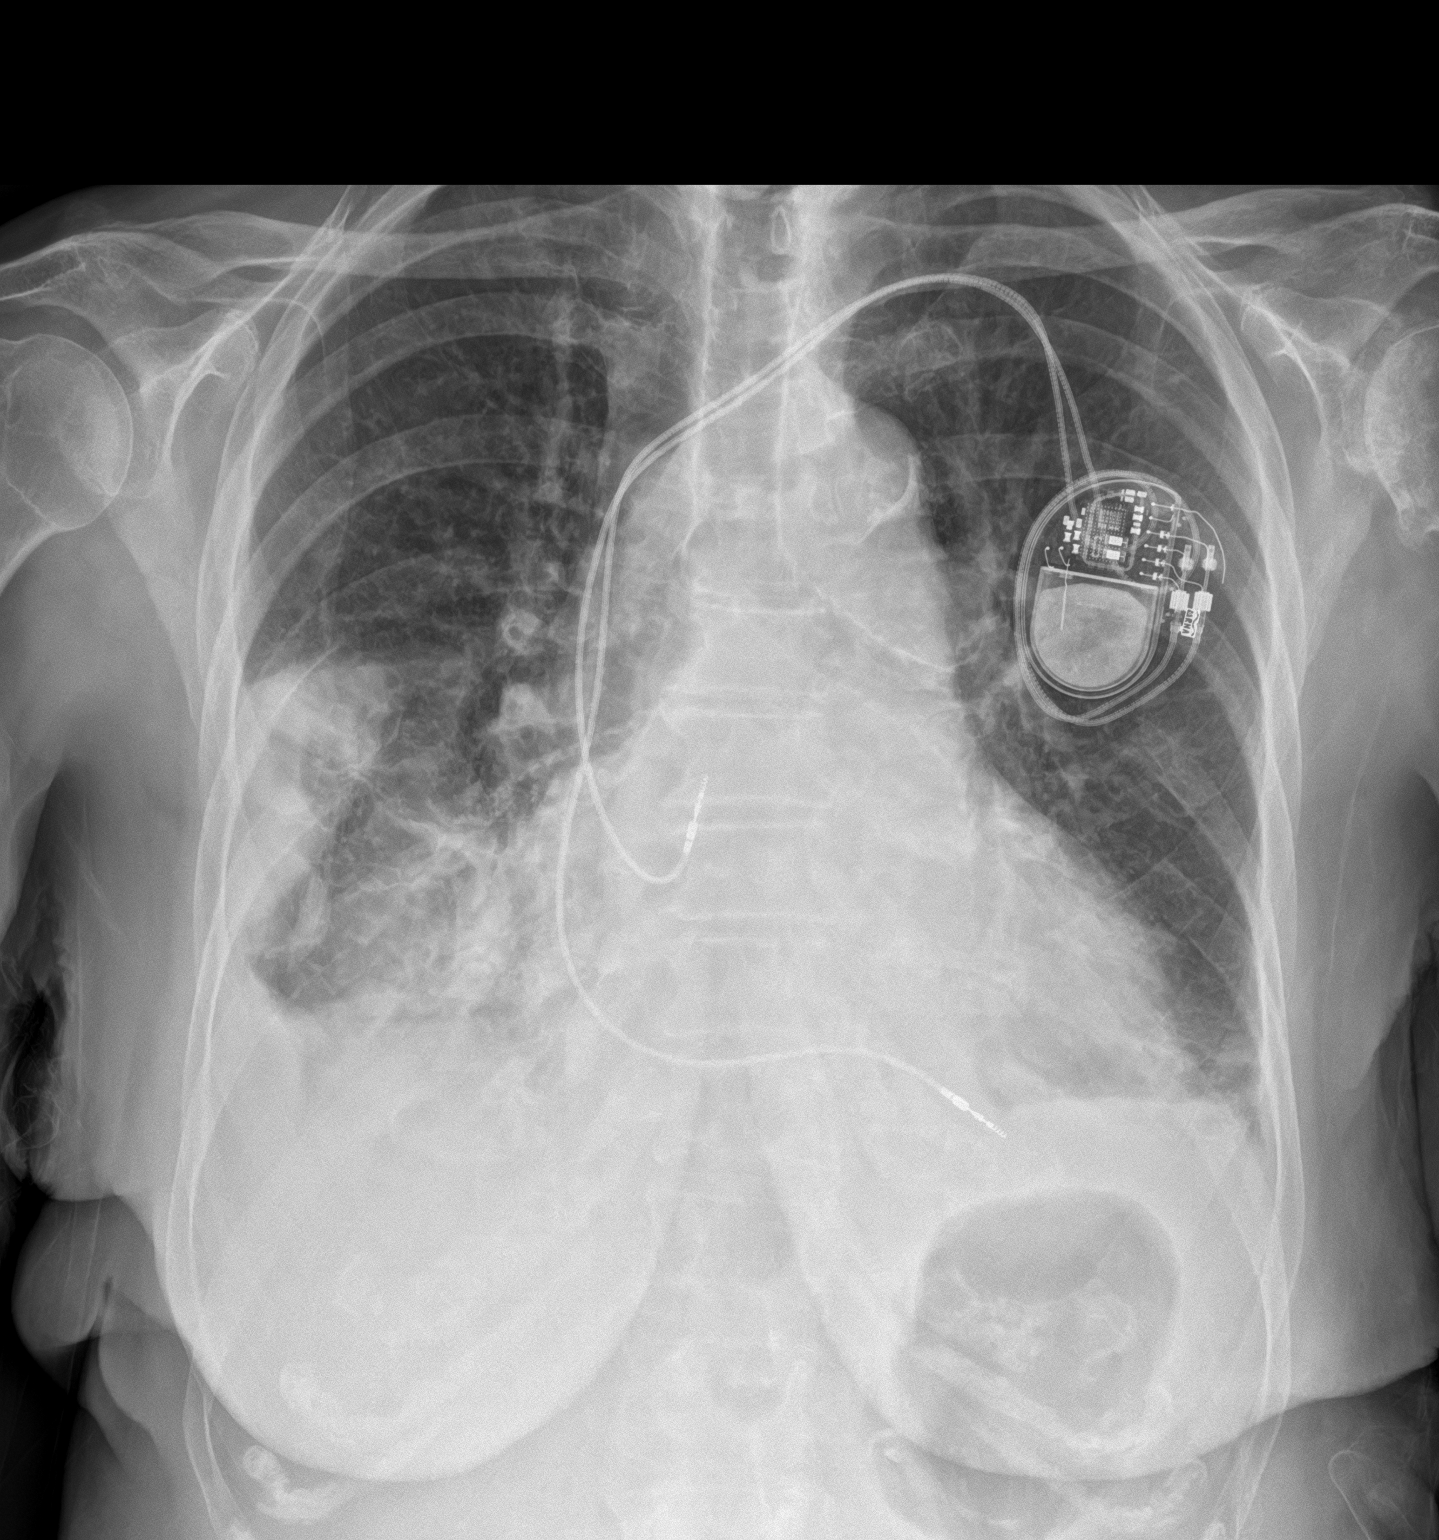
[im 2/2]
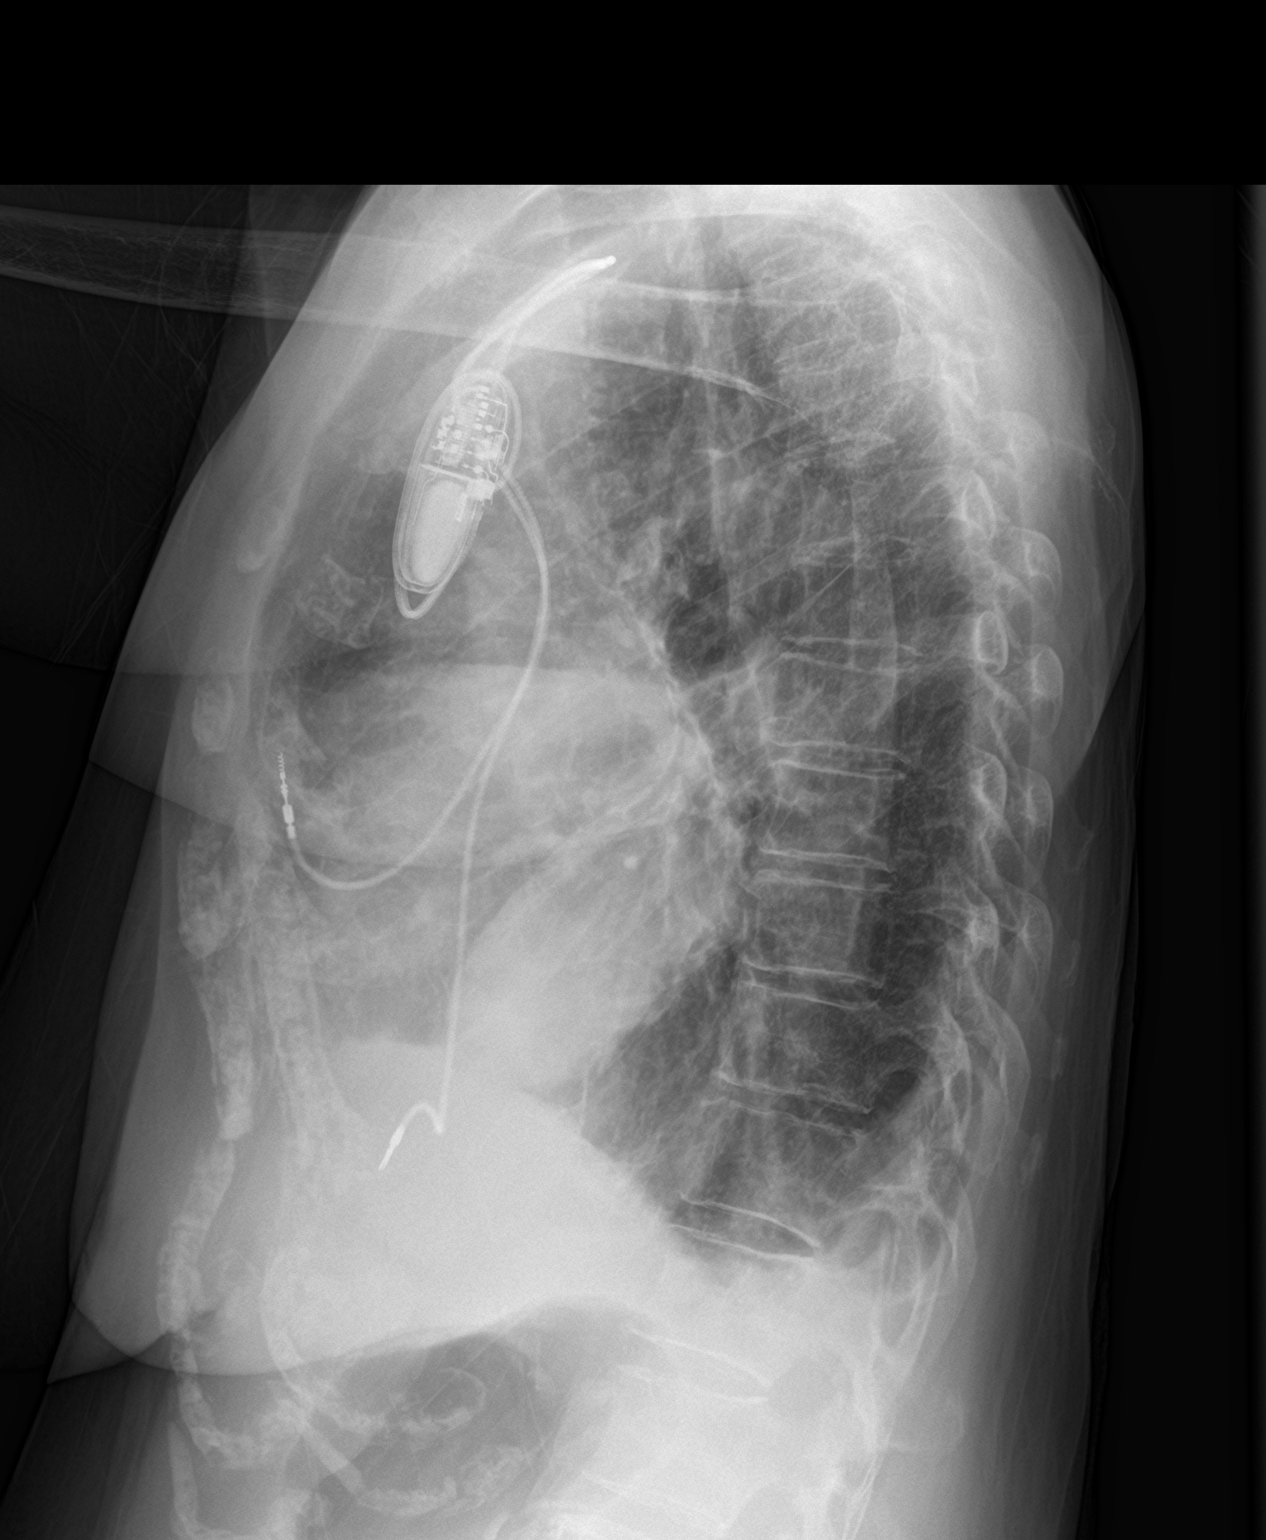

[2 of 2 positions shown; findings below may reference images not displayed]

FINDINGS: Hyperinflation. Persistent small pleural effusions. No change in
right lower lung pleural and parenchymal opacity. No change in right
middle lobe opacity. Stable cardiomediastinal silhouette, slightly
enlarged with atherosclerosis no pneumothorax.

Left-sided pacing device as before
IMPRESSION: 1. Continued right pleural and parenchymal disease in the right
middle lobe and right lower lobe which may reflect combination of
loculated pleural effusion and continued pneumonia. No significant
change from prior. Continued radiographic follow-up recommended
2. Small left pleural effusion with left basilar atelectasis
3. Mild cardiomegaly
